# Patient Record
Sex: Male | Born: 1950 | Race: White | Hispanic: No | Smoking: Never smoker
Health system: Southern US, Community
[De-identification: ages and names within clinical notes are randomized; demographics above are authoritative.]

## PROBLEM LIST (undated history)

## (undated) DIAGNOSIS — G20A1 Parkinson's disease without dyskinesia, without mention of fluctuations: Secondary | ICD-10-CM

## (undated) DIAGNOSIS — I10 Essential (primary) hypertension: Secondary | ICD-10-CM

## (undated) DIAGNOSIS — C801 Malignant (primary) neoplasm, unspecified: Secondary | ICD-10-CM

## (undated) DIAGNOSIS — F32A Depression, unspecified: Secondary | ICD-10-CM

## (undated) DIAGNOSIS — F419 Anxiety disorder, unspecified: Secondary | ICD-10-CM

## (undated) DIAGNOSIS — G20 Parkinson's disease: Secondary | ICD-10-CM

## (undated) DIAGNOSIS — F329 Major depressive disorder, single episode, unspecified: Secondary | ICD-10-CM

## (undated) HISTORY — DX: Major depressive disorder, single episode, unspecified: F32.9

## (undated) HISTORY — PX: CHOLECYSTECTOMY: SHX55

## (undated) HISTORY — PX: BACK SURGERY: SHX140

## (undated) HISTORY — DX: Malignant (primary) neoplasm, unspecified: C80.1

## (undated) HISTORY — PX: JOINT REPLACEMENT: SHX530

## (undated) HISTORY — DX: Anxiety disorder, unspecified: F41.9

## (undated) HISTORY — DX: Depression, unspecified: F32.A

## (undated) HISTORY — PX: DEEP BRAIN STIMULATOR PLACEMENT: SHX608

---

## 1981-11-07 HISTORY — PX: ORCHIECTOMY: SHX2116

## 2005-02-24 ENCOUNTER — Ambulatory Visit: Payer: Self-pay | Admitting: Pain Medicine

## 2005-02-25 ENCOUNTER — Ambulatory Visit: Payer: Self-pay | Admitting: Pain Medicine

## 2005-03-08 ENCOUNTER — Ambulatory Visit: Payer: Self-pay | Admitting: Pain Medicine

## 2005-03-22 ENCOUNTER — Ambulatory Visit: Payer: Self-pay | Admitting: Physician Assistant

## 2005-03-29 ENCOUNTER — Ambulatory Visit: Payer: Self-pay | Admitting: Pain Medicine

## 2005-04-12 ENCOUNTER — Ambulatory Visit: Payer: Self-pay | Admitting: Pain Medicine

## 2005-04-26 ENCOUNTER — Ambulatory Visit: Payer: Self-pay | Admitting: Physician Assistant

## 2005-09-23 ENCOUNTER — Inpatient Hospital Stay (HOSPITAL_COMMUNITY): Admission: RE | Admit: 2005-09-23 | Discharge: 2005-09-27 | Payer: Self-pay | Admitting: Orthopedic Surgery

## 2005-09-23 ENCOUNTER — Ambulatory Visit: Payer: Self-pay | Admitting: Internal Medicine

## 2005-10-03 ENCOUNTER — Ambulatory Visit: Payer: Self-pay

## 2005-10-06 ENCOUNTER — Ambulatory Visit: Payer: Self-pay | Admitting: Orthopedic Surgery

## 2005-10-10 ENCOUNTER — Ambulatory Visit: Payer: Self-pay | Admitting: Orthopedic Surgery

## 2005-10-18 ENCOUNTER — Ambulatory Visit: Payer: Self-pay

## 2005-10-24 ENCOUNTER — Ambulatory Visit: Payer: Self-pay

## 2005-11-01 ENCOUNTER — Ambulatory Visit: Payer: Self-pay | Admitting: Orthopedic Surgery

## 2005-11-11 ENCOUNTER — Inpatient Hospital Stay (HOSPITAL_COMMUNITY): Admission: RE | Admit: 2005-11-11 | Discharge: 2005-11-16 | Payer: Self-pay | Admitting: Orthopedic Surgery

## 2005-11-18 ENCOUNTER — Ambulatory Visit: Payer: Self-pay | Admitting: Orthopedic Surgery

## 2005-11-22 ENCOUNTER — Ambulatory Visit: Payer: Self-pay | Admitting: Family Medicine

## 2005-11-25 ENCOUNTER — Ambulatory Visit: Payer: Self-pay | Admitting: Orthopedic Surgery

## 2005-12-01 ENCOUNTER — Encounter: Payer: Self-pay | Admitting: Orthopedic Surgery

## 2005-12-08 ENCOUNTER — Encounter: Payer: Self-pay | Admitting: Orthopedic Surgery

## 2006-01-05 ENCOUNTER — Encounter: Payer: Self-pay | Admitting: Orthopedic Surgery

## 2007-12-11 ENCOUNTER — Ambulatory Visit: Payer: Self-pay | Admitting: Internal Medicine

## 2008-07-30 ENCOUNTER — Ambulatory Visit: Payer: Self-pay | Admitting: Pain Medicine

## 2008-08-07 ENCOUNTER — Ambulatory Visit: Payer: Self-pay | Admitting: Pain Medicine

## 2008-08-21 ENCOUNTER — Ambulatory Visit: Payer: Self-pay | Admitting: Physician Assistant

## 2008-08-28 ENCOUNTER — Ambulatory Visit: Payer: Self-pay | Admitting: Pain Medicine

## 2008-09-08 ENCOUNTER — Ambulatory Visit: Payer: Self-pay | Admitting: Pain Medicine

## 2008-09-11 ENCOUNTER — Ambulatory Visit: Payer: Self-pay | Admitting: Pain Medicine

## 2008-09-18 ENCOUNTER — Ambulatory Visit: Payer: Self-pay | Admitting: Physician Assistant

## 2008-10-16 ENCOUNTER — Ambulatory Visit: Payer: Self-pay | Admitting: Physician Assistant

## 2008-10-21 ENCOUNTER — Ambulatory Visit: Payer: Self-pay | Admitting: Pain Medicine

## 2008-11-12 ENCOUNTER — Ambulatory Visit: Payer: Self-pay | Admitting: Pain Medicine

## 2008-11-18 ENCOUNTER — Encounter: Payer: Self-pay | Admitting: Pain Medicine

## 2008-11-20 ENCOUNTER — Ambulatory Visit: Payer: Self-pay | Admitting: Pain Medicine

## 2008-12-04 ENCOUNTER — Ambulatory Visit: Payer: Self-pay | Admitting: Physician Assistant

## 2008-12-16 ENCOUNTER — Ambulatory Visit: Payer: Self-pay | Admitting: Pain Medicine

## 2008-12-19 ENCOUNTER — Encounter: Payer: Self-pay | Admitting: Pain Medicine

## 2008-12-31 ENCOUNTER — Ambulatory Visit: Payer: Self-pay | Admitting: Pain Medicine

## 2009-01-05 ENCOUNTER — Encounter: Payer: Self-pay | Admitting: Pain Medicine

## 2010-07-21 ENCOUNTER — Ambulatory Visit: Payer: Self-pay | Admitting: Internal Medicine

## 2011-02-15 ENCOUNTER — Encounter: Payer: Self-pay | Admitting: Internal Medicine

## 2011-03-08 ENCOUNTER — Encounter: Payer: Self-pay | Admitting: Internal Medicine

## 2012-01-07 DIAGNOSIS — G20 Parkinson's disease: Secondary | ICD-10-CM | POA: Insufficient documentation

## 2012-07-11 DIAGNOSIS — M419 Scoliosis, unspecified: Secondary | ICD-10-CM | POA: Insufficient documentation

## 2012-07-11 DIAGNOSIS — M19032 Primary osteoarthritis, left wrist: Secondary | ICD-10-CM | POA: Insufficient documentation

## 2013-10-17 DIAGNOSIS — R9389 Abnormal findings on diagnostic imaging of other specified body structures: Secondary | ICD-10-CM | POA: Insufficient documentation

## 2015-05-06 DIAGNOSIS — C4491 Basal cell carcinoma of skin, unspecified: Secondary | ICD-10-CM

## 2015-05-06 HISTORY — DX: Basal cell carcinoma of skin, unspecified: C44.91

## 2015-07-25 ENCOUNTER — Emergency Department
Admission: EM | Admit: 2015-07-25 | Discharge: 2015-07-25 | Disposition: A | Discharge status: Home / Self Care | Payer: 59 | Attending: Internal Medicine | Admitting: Internal Medicine

## 2015-07-25 ENCOUNTER — Emergency Department: Payer: 59

## 2015-07-25 ENCOUNTER — Encounter: Payer: Self-pay | Admitting: Emergency Medicine

## 2015-07-25 DIAGNOSIS — R079 Chest pain, unspecified: Secondary | ICD-10-CM

## 2015-07-25 DIAGNOSIS — M79602 Pain in left arm: Secondary | ICD-10-CM | POA: Diagnosis not present

## 2015-07-25 DIAGNOSIS — I10 Essential (primary) hypertension: Secondary | ICD-10-CM | POA: Diagnosis not present

## 2015-07-25 DIAGNOSIS — R0602 Shortness of breath: Secondary | ICD-10-CM | POA: Insufficient documentation

## 2015-07-25 HISTORY — DX: Parkinson's disease: G20

## 2015-07-25 HISTORY — DX: Essential (primary) hypertension: I10

## 2015-07-25 HISTORY — DX: Parkinson's disease without dyskinesia, without mention of fluctuations: G20.A1

## 2015-07-25 LAB — COMPREHENSIVE METABOLIC PANEL
ALBUMIN: 4.5 g/dL (ref 3.5–5.0)
ALT: 13 U/L — ABNORMAL LOW (ref 17–63)
AST: 39 U/L (ref 15–41)
Alkaline Phosphatase: 72 U/L (ref 38–126)
Anion gap: 9 (ref 5–15)
BILIRUBIN TOTAL: 1.6 mg/dL — AB (ref 0.3–1.2)
BUN: 18 mg/dL (ref 6–20)
CO2: 26 mmol/L (ref 22–32)
Calcium: 9.6 mg/dL (ref 8.9–10.3)
Chloride: 106 mmol/L (ref 101–111)
Creatinine, Ser: 1.02 mg/dL (ref 0.61–1.24)
GFR calc Af Amer: >60 mL/min (ref >60)
GFR calc non Af Amer: >60 mL/min (ref >60)
Glucose, Bld: 98 mg/dL (ref 65–99)
Potassium: 4.2 mmol/L (ref 3.5–5.1)
Sodium: 141 mmol/L (ref 135–145)
Total Protein: 7.4 g/dL (ref 6.5–8.1)

## 2015-07-25 LAB — TROPONIN I: Troponin I: <0.03 ng/mL (ref <0.031)

## 2015-07-25 LAB — CBC
HCT: 47.9 % (ref 40.0–52.0)
Hemoglobin: 16.1 g/dL (ref 13.0–18.0)
MCH: 28.6 pg (ref 26.0–34.0)
MCHC: 33.6 g/dL (ref 32.0–36.0)
MCV: 85.3 fL (ref 80.0–100.0)
PLATELETS: 193 10*3/uL (ref 150–440)
RBC: 5.62 MIL/uL (ref 4.40–5.90)
RDW: 14.3 % (ref 11.5–14.5)
WBC: 9.5 10*3/uL (ref 3.8–10.6)

## 2015-07-25 MED ORDER — ACETAMINOPHEN 500 MG PO TABS
1000.0000 mg | ORAL_TABLET | Freq: Once | ORAL | Status: AC
  Administered 2015-07-25: 1000 mg via ORAL
  Filled 2015-07-25: qty 2

## 2015-07-25 MED ORDER — TRAMADOL HCL 50 MG PO TABS: 50.0000 mg | ORAL_TABLET | Freq: Four times a day (QID) | ORAL | Status: DC | PRN

## 2015-07-25 NOTE — ED Notes (Signed)
Patient reports recent productive cough and was on PO atbx that he finished on Monday.

## 2015-07-25 NOTE — ED Notes (Signed)
Pt reports left arm pain that moved to chest and SOB that started about 10am,. Pt states he feels like he is going to pass out. States he took 4 baby Asprin at home

## 2015-07-25 NOTE — Discharge Instructions (Signed)

## 2015-07-25 NOTE — ED Notes (Addendum)
Patient has multiple medical c/o's. He states that he has had trouble with raising his left arm up to shampoo his head. Has a history of rotator cuff repair and left wrist surgery. Also c/o pain to the left wrist to the left elbow.  Pt states a discomfort to his left chest.  He has a brain stimulator implant in his left chest for parkinson's. Alert and oriented.  Reports to drinking alcohol last night.

## 2015-07-25 NOTE — ED Notes (Signed)
N

## 2015-07-25 NOTE — ED Provider Notes (Signed)
[facility] Emergency Department Provider Note     Time seen: ----------------------------------------- 2:14 PM on 07/25/2015 -----------------------------------------    I have reviewed the triage vital signs and the nursing notes.   HISTORY  Chief Complaint Chest Pain and Shortness of Breath    HPI Charles Choi is a 64 y.o. male who presents ER with mostly left arm pain. Patient states he had rotator cuff repair and left wrist surgery in the past. Patient has an appointment on Monday to see orthopedics for his left arm pain. He has history of Parkinson's disease as it brain stimulator implant to his left chest. He is also having some discomfort in his left chest today. Reports drinking alcohol last night.   Past Medical History  Diagnosis Date  . Hypertension   . Parkinson's disease     There are no active problems to display for this patient.   Past Surgical History  Procedure Laterality Date  . Joint replacement      left knee x2  . Back surgery    . Cholecystectomy      Allergies Phenergan and Reglan  Social History Social History  Substance Use Topics  . Smoking status: Never Smoker   . Smokeless tobacco: None  . Alcohol Use: 2.4 oz/week    4 Shots of liquor per week    Review of Systems Constitutional: Negative for fever. Eyes: Negative for visual changes. ENT: Negative for sore throat. Cardiovascular: Positive for chest pain Respiratory: Negative for shortness of breath. Gastrointestinal: Negative for abdominal pain, vomiting and diarrhea. Genitourinary: Negative for dysuria. Musculoskeletal: Negative for back pain. Skin: Negative for rash. Neurological: Negative for headaches, focal weakness or numbness.  10-point ROS otherwise negative.  ____________________________________________   PHYSICAL EXAM:  VITAL SIGNS: ED Triage Vitals  Enc Vitals Group     BP 07/25/15 1331 153/98 mmHg     Pulse Rate 07/25/15 1331  98     Resp 07/25/15 1331 18     Temp --      Temp src --      SpO2 07/25/15 1331 99 %     Weight 07/25/15 1331 160 lb (72.576 kg)     Height 07/25/15 1331 5\' 8"  (1.727 m)     Head Cir --      Peak Flow --      Pain Score 07/25/15 1332 5     Pain Loc --      Pain Edu? --      Excl. in [place]? --     Constitutional: Alert and oriented. Well appearing and in no distress. Eyes: Conjunctivae are normal. PERRL. Normal extraocular movements. ENT   Head: Normocephalic and atraumatic.   Nose: No congestion/rhinnorhea.   Mouth/Throat: Mucous membranes are moist.   Neck: No stridor. Cardiovascular: Normal rate, regular rhythm. Normal and symmetric distal pulses are present in all extremities. No murmurs, rubs, or gallops. Respiratory: Normal respiratory effort without tachypnea nor retractions. Breath sounds are clear and equal bilaterally. No wheezes/rales/rhonchi. Gastrointestinal: Soft and nontender. No distention. No abdominal bruits.  Musculoskeletal: Mild pain with range of motion left upper extremity. Neurologic:  Normal speech and language. No gross focal neurologic deficits are appreciated. Speech is normal. No gait instability. Skin:  Skin is warm, dry and intact. No rash noted. Psychiatric: Mood and affect are normal. Speech and behavior are normal. Patient exhibits appropriate insight and judgment. ____________________________________________  EKG: Interpreted by me. Normal sinus rhythm rate 82 bpm, left ventricular hypertrophy. Possible septal  infarct. No evidence of acute infarction  ____________________________________________  ED COURSE:  Pertinent labs & imaging results that were available during my care of the patient were reviewed by me and considered in my medical decision making (see chart for details). Check cardiac labs, doubt acute coronary syndrome. ____________________________________________    LABS (pertinent positives/negatives)  Labs Reviewed   COMPREHENSIVE METABOLIC PANEL - Abnormal; Notable for the following:    ALT 13 (*)    Total Bilirubin 1.6 (*)    All other components within normal limits  CBC  TROPONIN I    RADIOLOGY Images were viewed by me  Chest x-ray  IMPRESSION: No evidence of infiltrate or edema.  Consider CT thorax for possible left lower lobe pulmonary nodule. ____________________________________________  FINAL ASSESSMENT AND PLAN  Chest pain  Plan: Patient with labs and imaging as dictated above. No clear etiology for his symptoms are identified. Likely chronic pain or arthritis related. He is stable for outpatient follow-up   Earleen Newport, MD   Earleen Newport, MD 07/25/15 [phone]

## 2016-06-10 ENCOUNTER — Ambulatory Visit
Admission: RE | Admit: 2016-06-10 | Discharge: 2016-06-10 | Disposition: A | Discharge status: Home / Self Care | Payer: 59 | Source: Ambulatory Visit | Attending: Physician Assistant | Admitting: Physician Assistant

## 2016-06-10 ENCOUNTER — Other Ambulatory Visit: Payer: Self-pay | Admitting: Physician Assistant

## 2016-06-10 DIAGNOSIS — M7989 Other specified soft tissue disorders: Secondary | ICD-10-CM | POA: Insufficient documentation

## 2016-07-05 ENCOUNTER — Emergency Department: Payer: 59

## 2016-07-05 ENCOUNTER — Emergency Department
Admission: EM | Admit: 2016-07-05 | Discharge: 2016-07-05 | Disposition: A | Discharge status: Home / Self Care | Payer: 59 | Attending: Emergency Medicine | Admitting: Emergency Medicine

## 2016-07-05 ENCOUNTER — Encounter: Payer: Self-pay | Admitting: *Deleted

## 2016-07-05 DIAGNOSIS — I10 Essential (primary) hypertension: Secondary | ICD-10-CM | POA: Diagnosis not present

## 2016-07-05 DIAGNOSIS — Y999 Unspecified external cause status: Secondary | ICD-10-CM | POA: Insufficient documentation

## 2016-07-05 DIAGNOSIS — Y929 Unspecified place or not applicable: Secondary | ICD-10-CM | POA: Insufficient documentation

## 2016-07-05 DIAGNOSIS — W19XXXA Unspecified fall, initial encounter: Secondary | ICD-10-CM | POA: Diagnosis not present

## 2016-07-05 DIAGNOSIS — M25461 Effusion, right knee: Secondary | ICD-10-CM | POA: Diagnosis not present

## 2016-07-05 DIAGNOSIS — S93401A Sprain of unspecified ligament of right ankle, initial encounter: Secondary | ICD-10-CM | POA: Diagnosis not present

## 2016-07-05 DIAGNOSIS — Y939 Activity, unspecified: Secondary | ICD-10-CM | POA: Diagnosis not present

## 2016-07-05 DIAGNOSIS — G20 Parkinson's disease: Secondary | ICD-10-CM | POA: Diagnosis not present

## 2016-07-05 DIAGNOSIS — Z79899 Other long term (current) drug therapy: Secondary | ICD-10-CM | POA: Diagnosis not present

## 2016-07-05 DIAGNOSIS — S99911A Unspecified injury of right ankle, initial encounter: Secondary | ICD-10-CM | POA: Diagnosis present

## 2016-07-05 MED ORDER — OXYCODONE-ACETAMINOPHEN 5-325 MG PO TABS: 1.0000 | ORAL_TABLET | Freq: Four times a day (QID) | ORAL | 0 refills | Status: DC | PRN

## 2016-07-05 MED ORDER — HYDROCODONE-ACETAMINOPHEN 5-325 MG PO TABS: 1.0000 | ORAL_TABLET | ORAL | 0 refills | Status: DC | PRN

## 2016-07-05 MED ORDER — MELOXICAM 15 MG PO TABS: 15.0000 mg | ORAL_TABLET | Freq: Every day | ORAL | 0 refills | Status: DC

## 2016-07-05 NOTE — ED Triage Notes (Addendum)
Pt to triage via wheelchair.  Pt has parkinson's disease.  Pt fell 1 month ago.  Pt continues to have pain and swelling of right knee and ankle.  Pt reports had an u/s 3 weeks ago that was normal and had xrays that was normal.  Pt alert.   Pt denies sob or chest pain.

## 2016-07-05 NOTE — ED Provider Notes (Signed)
[facility] Emergency Department Provider Note ____________________________________________  Time seen: Approximately 6:01 PM  I have reviewed the triage vital signs and the nursing notes.   HISTORY  Chief Complaint Knee Pain and Ankle Pain    HPI Charles Choi is a 65 y.o. male who presents to the emergency department for evaluation of right knee and ankle pain. He states that approximately one month ago he had a series of non-syncopal falls secondary to instability related to Parkinson's and has had pain since that time. He was evaluated by his primary care provider and due to diffuse swelling in the right lower extremity, an ultrasound was ordered to confirm there was no DVT. He also states that he went to an orthopedic urgent care and had an x-ray of his right hip and upper leg. X-rays were normal. Again, because of the swelling in the extremity he was placed on a prednisone taper. He doesn't feel that this helped very much. He has been taking Aleve or ibuprofen at home without relief of the pain or swelling.  Past Medical History:  Diagnosis Date  . Hypertension   . Parkinson's disease ([place])     There are no active problems to display for this patient.   Past Surgical History:  Procedure Laterality Date  . BACK SURGERY    . CHOLECYSTECTOMY    . JOINT REPLACEMENT     left knee x2    Prior to Admission medications   Medication Sig Start Date End Date Taking? Authorizing Provider  amantadine (SYMMETREL) 100 MG capsule Take 100 mg by mouth daily. 07/09/15   Historical Provider, MD  carbidopa-levodopa (SINEMET IR) 25-100 MG per tablet Take 0.5-1 tablets by mouth See admin instructions. Take 1 tablet orally two times a day at 7am, and 10pm. Take 0.5 tablet orally at 10am, 1pm, 4pm, and 7pm. 07/20/15   Historical Provider, MD  losartan (COZAAR) 50 MG tablet Take 50 mg by mouth daily. 07/07/15   Historical Provider, MD  meloxicam (MOBIC) 15 MG tablet Take 1  tablet (15 mg total) by mouth daily. 07/05/16   Victorino Dike, FNP  omeprazole (PRILOSEC OTC) 20 MG tablet Take 20 mg by mouth 2 (two) times daily.    Historical Provider, MD  oxyCODONE-acetaminophen (ROXICET) 5-325 MG tablet Take 1 tablet by mouth every 6 (six) hours as needed. 07/05/16   Victorino Dike, FNP  rOPINIRole (REQUIP) 2 MG tablet Take 2 mg by mouth 6 (six) times daily. At 7am, and 1pm. 07/20/15   Historical Provider, MD  selegiline (ELDEPRYL) 5 MG capsule Take 5 mg by mouth 2 (two) times daily. At 7am and 1pm. 07/09/15   Historical Provider, MD  traZODone (DESYREL) 150 MG tablet Take 150 mg by mouth at bedtime. 06/18/15   Historical Provider, MD    Allergies Phenergan [promethazine hcl] and Reglan [metoclopramide]  No family history on file.  Social History Social History  Substance Use Topics  . Smoking status: Never Smoker  . Smokeless tobacco: Never Used  . Alcohol use 2.4 oz/week    4 Shots of liquor per week    Review of Systems Constitutional: No recent illness. Cardiovascular: Denies chest pain or palpitations. Respiratory: Denies shortness of breath. Musculoskeletal: Pain in Right lower extremity Skin: Negative for rash, wound, lesion. Neurological: Negative for focal weakness or numbness.  ____________________________________________   PHYSICAL EXAM:  VITAL SIGNS: ED Triage Vitals  Enc Vitals Group     BP 07/05/16 1732 (!) 158/75  Pulse Rate 07/05/16 1732 84     Resp 07/05/16 1732 20     Temp 07/05/16 1732 98.3 F (36.8 C)     Temp Source 07/05/16 1732 Oral     SpO2 07/05/16 1732 99 %     Weight 07/05/16 1734 165 lb (74.8 kg)     Height 07/05/16 1734 5\' 7"  (1.702 m)     Head Circumference --      Peak Flow --      Pain Score 07/05/16 1734 4     Pain Loc --      Pain Edu? --      Excl. in [place]? --     Constitutional: Alert and oriented. Well appearing and in no acute distress. Eyes: Conjunctivae are normal. EOMI. Head: Atraumatic. Neck: No  stridor.  Respiratory: Normal respiratory effort.   Musculoskeletal: Diffuse swelling of the right lower extremity from the knee to toes. There is an obvious joint effusion on the right knee. [place] ankle rules are negative. Neurologic:  Normal speech and language. No gross focal neurologic deficits are appreciated. Speech is normal. No gait instability. Skin:  Warm, dry, and intact. Diffuse, non-pitting edema from the knee to the toes. Psychiatric: Mood and affect are normal. Speech and behavior are normal.  ____________________________________________   LABS (all labs ordered are listed, but only abnormal results are displayed)  Labs Reviewed - No data to display ____________________________________________  RADIOLOGY  Right knee with obvious joint effusion. Mild arthritic changes. Right ankle without acute bony abnormality Per radiology. ____________________________________________   PROCEDURES  Procedure(s) performed: Jones wrap was applied to the right knee by the ER tech. Velcro ankle stirrup splint was applied to the right ankle also by the ER tech.   ____________________________________________   INITIAL IMPRESSION / ASSESSMENT AND PLAN / ED COURSE  Clinical Course    Pertinent labs & imaging results that were available during my care of the patient were reviewed by me and considered in my medical decision making (see chart for details).  Patient was instructed to rest, ice, and elevate the right lower extremity. He was instructed to wear the braces while out of bed. He is instructed to take his meloxicam and Percocet as prescribed. He was encouraged to follow up with orthopedics for symptoms that are not improving over the week. ____________________________________________   FINAL CLINICAL IMPRESSION(S) / ED DIAGNOSES  Final diagnoses:  Ankle sprain, right, initial encounter  Joint effusion, knee, right       Victorino Dike, FNP 07/05/16 [place], MD 07/05/16 [phone]

## 2016-07-14 ENCOUNTER — Ambulatory Visit
Admission: RE | Admit: 2016-07-14 | Discharge: 2016-07-14 | Disposition: A | Discharge status: Home / Self Care | Payer: 59 | Source: Ambulatory Visit | Attending: Internal Medicine | Admitting: Internal Medicine

## 2016-07-14 ENCOUNTER — Other Ambulatory Visit: Payer: Self-pay | Admitting: Internal Medicine

## 2016-07-14 DIAGNOSIS — M79604 Pain in right leg: Secondary | ICD-10-CM

## 2016-07-14 DIAGNOSIS — R600 Localized edema: Secondary | ICD-10-CM | POA: Insufficient documentation

## 2016-07-15 ENCOUNTER — Ambulatory Visit: Payer: Medicare Other

## 2016-07-21 ENCOUNTER — Emergency Department: Payer: 59

## 2016-07-21 ENCOUNTER — Encounter: Payer: Self-pay | Admitting: Emergency Medicine

## 2016-07-21 ENCOUNTER — Emergency Department
Admission: EM | Admit: 2016-07-21 | Discharge: 2016-07-21 | Disposition: A | Discharge status: Home / Self Care | Payer: 59 | Attending: Emergency Medicine | Admitting: Emergency Medicine

## 2016-07-21 DIAGNOSIS — F1290 Cannabis use, unspecified, uncomplicated: Secondary | ICD-10-CM | POA: Insufficient documentation

## 2016-07-21 DIAGNOSIS — M7121 Synovial cyst of popliteal space [Baker], right knee: Secondary | ICD-10-CM | POA: Insufficient documentation

## 2016-07-21 DIAGNOSIS — M25561 Pain in right knee: Secondary | ICD-10-CM | POA: Diagnosis present

## 2016-07-21 DIAGNOSIS — M25461 Effusion, right knee: Secondary | ICD-10-CM

## 2016-07-21 DIAGNOSIS — G20 Parkinson's disease: Secondary | ICD-10-CM | POA: Diagnosis not present

## 2016-07-21 DIAGNOSIS — Z79899 Other long term (current) drug therapy: Secondary | ICD-10-CM | POA: Diagnosis not present

## 2016-07-21 DIAGNOSIS — I10 Essential (primary) hypertension: Secondary | ICD-10-CM | POA: Insufficient documentation

## 2016-07-21 MED ORDER — OXYCODONE HCL 5 MG PO TABS
10.0000 mg | ORAL_TABLET | Freq: Once | ORAL | Status: AC
  Administered 2016-07-21: 10 mg via ORAL
  Filled 2016-07-21: qty 2

## 2016-07-21 MED ORDER — HYDROMORPHONE HCL 1 MG/ML IJ SOLN
1.0000 mg | Freq: Once | INTRAMUSCULAR | Status: AC
  Administered 2016-07-21: 1 mg via INTRAMUSCULAR
  Filled 2016-07-21: qty 1

## 2016-07-21 NOTE — Discharge Instructions (Signed)
Please wear the compression stockings daily. Wear the knee immobilizer while out of bed.  Call Dr. Nicholaus Bloom office in the morning to schedule an appointment. Continue your medications as prescribed.

## 2016-07-21 NOTE — ED Provider Notes (Signed)
[facility] Emergency Department Provider Note ____________________________________________  Time seen: Approximately 1942  I have reviewed the triage vital signs and the nursing notes.   HISTORY  Chief Complaint Knee Pain    HPI Charles Choi is a 65 y.o. male presents to the emergency department for evaluation of right knee pain.He had a series of non-syncopal falls secondary to instability related to Parkinson's disease approximately 6 weeks ago. Since that time, he has had pain in the right knee. He has been evaluated by his primary care provider, has had an ultrasound to confirm no DVT, plain x-rays of the hip, knee, and ankle, and has been evaluated by Dr. Roland Rack and his PA who diagnosed him with a ruptured Bakers cyst and gave him a steroid injection in the right knee. He states that he saw orthopedics last on 07/15/2016. He has a follow-up appointment scheduled, but doesn't recall when. He states that the knee is "spasming and is so painful he can't stand it."   Past Medical History:  Diagnosis Date  . Hypertension   . Parkinson's disease ([place])     There are no active problems to display for this patient.   Past Surgical History:  Procedure Laterality Date  . BACK SURGERY    . CHOLECYSTECTOMY    . JOINT REPLACEMENT     left knee x2    Prior to Admission medications   Medication Sig Start Date End Date Taking? Authorizing Provider  amantadine (SYMMETREL) 100 MG capsule Take 100 mg by mouth daily. 07/09/15   Historical Provider, MD  carbidopa-levodopa (SINEMET IR) 25-100 MG per tablet Take 0.5-1 tablets by mouth See admin instructions. Take 1 tablet orally two times a day at 7am, and 10pm. Take 0.5 tablet orally at 10am, 1pm, 4pm, and 7pm. 07/20/15   Historical Provider, MD  losartan (COZAAR) 50 MG tablet Take 50 mg by mouth daily. 07/07/15   Historical Provider, MD  meloxicam (MOBIC) 15 MG tablet Take 1 tablet (15 mg total) by mouth daily. 07/05/16   Victorino Dike, FNP  omeprazole (PRILOSEC OTC) 20 MG tablet Take 20 mg by mouth 2 (two) times daily.    Historical Provider, MD  oxyCODONE-acetaminophen (ROXICET) 5-325 MG tablet Take 1 tablet by mouth every 6 (six) hours as needed. 07/05/16   Victorino Dike, FNP  rOPINIRole (REQUIP) 2 MG tablet Take 2 mg by mouth 6 (six) times daily. At 7am, and 1pm. 07/20/15   Historical Provider, MD  selegiline (ELDEPRYL) 5 MG capsule Take 5 mg by mouth 2 (two) times daily. At 7am and 1pm. 07/09/15   Historical Provider, MD  traZODone (DESYREL) 150 MG tablet Take 150 mg by mouth at bedtime. 06/18/15   Historical Provider, MD    Allergies Phenergan [promethazine hcl] and Reglan [metoclopramide]  No family history on file.  Social History Social History  Substance Use Topics  . Smoking status: Never Smoker  . Smokeless tobacco: Never Used  . Alcohol use 2.4 oz/week    4 Shots of liquor per week    Review of Systems Constitutional: No recent illness. Cardiovascular: Denies chest pain or palpitations. Respiratory: Denies shortness of breath. Musculoskeletal: Pain in right knee Skin: Negative for rash, wound, lesion. Neurological: Negative for focal weakness or numbness.  ____________________________________________   PHYSICAL EXAM:  VITAL SIGNS: ED Triage Vitals  Enc Vitals Group     BP 07/21/16 1827 (!) 171/89     Pulse Rate 07/21/16 1827 91     Resp  07/21/16 1827 18     Temp 07/21/16 1827 98.2 F (36.8 C)     Temp Source 07/21/16 1827 Oral     SpO2 --      Weight 07/21/16 1830 162 lb (73.5 kg)     Height 07/21/16 1830 5\' 7"  (1.702 m)     Head Circumference --      Peak Flow --      Pain Score 07/21/16 1830 6     Pain Loc --      Pain Edu? --      Excl. in [place]? --     Constitutional: Alert and oriented. Well appearing and in no acute distress. Eyes: Conjunctivae are normal. EOMI. Head: Atraumatic. Neck: No stridor.  Respiratory: Normal respiratory effort.   Musculoskeletal: Visual  muscle spasm of the right quadriceps and severe tenderness to the lateral aspect of the knee. Crepitus is also noted on exam. Neurologic:  Normal speech and language. No gross focal neurologic deficits are appreciated. Speech is normal. No gait instability. Skin:  Skin is warm, dry and intact. Atraumatic. No erythema or lesion noted Psychiatric: Mood and affect are normal. Speech and behavior are normal.  ____________________________________________   LABS (all labs ordered are listed, but only abnormal results are displayed)  Labs Reviewed - No data to display ____________________________________________  RADIOLOGY  CT right knee:  IMPRESSION:  1. Heterogeneous complex joint effusion with synovial thickening and  intra-articular debris. Fluid in the medial knee may reflect Baker  cyst, +/-rupture. There is an ill-defined intramuscular fluid  collection in the posterior lateral compartment of the calf that is  a elongated measuring greater than 12 cm than the setting of injury  is likely a liquefying hematoma, infected fluid collection or  abscess not excluded. Recommend continued clinical follow-up to  exclude cystic mass, felt less likely given recent injury.  2. 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 Remote fracture of the medial tibial plateau.  3. MRI recommended for evaluation of internal derangement of the  knee.    ____________________________________________   PROCEDURES  Procedure(s) performed: Knee immobilizer applied to the right knee by ER tech.    ____________________________________________   INITIAL IMPRESSION / ASSESSMENT AND PLAN / ED COURSE  Clinical Course  Comment By Time  Continue to await CT results. Patient's pain has improved. He is resting in the room talking with family/friend. Victorino Dike, FNP 09/14 2135    Pertinent labs & imaging results that were available during my care of the patient  were reviewed by me and considered in my medical decision making (see chart for details).  Because he has had plain films, ultrasound, evaluation by primary care, and evaluation by orthopedics will request a CT of the knee tonight. 1 mg of Dilaudid intramuscular ordered as well.  Case discussed with Dr. Roland Rack who will see him in follow up. Will apply a knee immobilizer and encourage him to use it while out of bed.  ____________________________________________   FINAL CLINICAL IMPRESSION(S) / ED DIAGNOSES  Final diagnoses:  Baker's cyst of knee, right  Knee effusion, right       Victorino Dike, FNP 07/25/16 1243    Harvest Dark, MD 07/25/16 [phone]

## 2016-07-21 NOTE — ED Triage Notes (Signed)
Reports pain in right hip radiating down leg and knee pain since a fall last month.

## 2016-07-21 NOTE — ED Notes (Signed)
Pt called at [place] with no response. First nurse Sharyn Lull notified.

## 2016-07-28 ENCOUNTER — Other Ambulatory Visit: Payer: Self-pay | Admitting: Student

## 2016-07-28 DIAGNOSIS — M1711 Unilateral primary osteoarthritis, right knee: Secondary | ICD-10-CM

## 2016-07-28 DIAGNOSIS — M7121 Synovial cyst of popliteal space [Baker], right knee: Secondary | ICD-10-CM

## 2016-07-28 DIAGNOSIS — M7989 Other specified soft tissue disorders: Secondary | ICD-10-CM

## 2016-08-02 ENCOUNTER — Other Ambulatory Visit: Payer: Self-pay | Admitting: Student

## 2016-08-02 ENCOUNTER — Ambulatory Visit
Admission: RE | Admit: 2016-08-02 | Discharge: 2016-08-02 | Disposition: A | Discharge status: Home / Self Care | Payer: 59 | Source: Ambulatory Visit | Attending: Student | Admitting: Student

## 2016-08-02 ENCOUNTER — Ambulatory Visit (HOSPITAL_BASED_OUTPATIENT_CLINIC_OR_DEPARTMENT_OTHER): Payer: Medicare Other

## 2016-08-02 DIAGNOSIS — M7121 Synovial cyst of popliteal space [Baker], right knee: Secondary | ICD-10-CM

## 2016-08-02 DIAGNOSIS — M7989 Other specified soft tissue disorders: Secondary | ICD-10-CM | POA: Insufficient documentation

## 2016-08-02 DIAGNOSIS — M1711 Unilateral primary osteoarthritis, right knee: Secondary | ICD-10-CM | POA: Diagnosis not present

## 2016-08-02 NOTE — Procedures (Signed)
Under US guidance, aspiration of right calf seroma was performed. No immediate complication.

## 2016-08-14 ENCOUNTER — Observation Stay
Admission: EM | Admit: 2016-08-14 | Discharge: 2016-08-15 | Disposition: A | Discharge status: Home / Self Care | Payer: 59 | Attending: Internal Medicine | Admitting: Internal Medicine

## 2016-08-14 ENCOUNTER — Emergency Department: Payer: 59

## 2016-08-14 ENCOUNTER — Other Ambulatory Visit: Payer: Self-pay

## 2016-08-14 ENCOUNTER — Encounter: Payer: Self-pay | Admitting: *Deleted

## 2016-08-14 DIAGNOSIS — F410 Panic disorder [episodic paroxysmal anxiety] without agoraphobia: Secondary | ICD-10-CM | POA: Diagnosis not present

## 2016-08-14 DIAGNOSIS — G20 Parkinson's disease: Secondary | ICD-10-CM | POA: Insufficient documentation

## 2016-08-14 DIAGNOSIS — Z96652 Presence of left artificial knee joint: Secondary | ICD-10-CM | POA: Insufficient documentation

## 2016-08-14 DIAGNOSIS — I259 Chronic ischemic heart disease, unspecified: Secondary | ICD-10-CM | POA: Insufficient documentation

## 2016-08-14 DIAGNOSIS — M6281 Muscle weakness (generalized): Secondary | ICD-10-CM

## 2016-08-14 DIAGNOSIS — Z9889 Other specified postprocedural states: Secondary | ICD-10-CM | POA: Diagnosis not present

## 2016-08-14 DIAGNOSIS — Z9049 Acquired absence of other specified parts of digestive tract: Secondary | ICD-10-CM | POA: Diagnosis not present

## 2016-08-14 DIAGNOSIS — Z888 Allergy status to other drugs, medicaments and biological substances status: Secondary | ICD-10-CM | POA: Insufficient documentation

## 2016-08-14 DIAGNOSIS — Z791 Long term (current) use of non-steroidal anti-inflammatories (NSAID): Secondary | ICD-10-CM | POA: Diagnosis not present

## 2016-08-14 DIAGNOSIS — Z79899 Other long term (current) drug therapy: Secondary | ICD-10-CM | POA: Insufficient documentation

## 2016-08-14 DIAGNOSIS — R079 Chest pain, unspecified: Secondary | ICD-10-CM | POA: Diagnosis present

## 2016-08-14 DIAGNOSIS — I10 Essential (primary) hypertension: Secondary | ICD-10-CM | POA: Insufficient documentation

## 2016-08-14 DIAGNOSIS — F331 Major depressive disorder, recurrent, moderate: Secondary | ICD-10-CM | POA: Insufficient documentation

## 2016-08-14 DIAGNOSIS — Z9181 History of falling: Secondary | ICD-10-CM

## 2016-08-14 DIAGNOSIS — M1711 Unilateral primary osteoarthritis, right knee: Secondary | ICD-10-CM | POA: Insufficient documentation

## 2016-08-14 LAB — COMPREHENSIVE METABOLIC PANEL
ALBUMIN: 3.8 g/dL (ref 3.5–5.0)
ALT: 16 U/L — ABNORMAL LOW (ref 17–63)
AST: 23 U/L (ref 15–41)
Alkaline Phosphatase: 114 U/L (ref 38–126)
Anion gap: 8 (ref 5–15)
BUN: 21 mg/dL — AB (ref 6–20)
CHLORIDE: 101 mmol/L (ref 101–111)
CO2: 28 mmol/L (ref 22–32)
Calcium: 9.3 mg/dL (ref 8.9–10.3)
Creatinine, Ser: 1.02 mg/dL (ref 0.61–1.24)
GFR calc Af Amer: >60 mL/min (ref >60)
GFR calc non Af Amer: >60 mL/min (ref >60)
Glucose, Bld: 106 mg/dL — ABNORMAL HIGH (ref 65–99)
Potassium: 3.8 mmol/L (ref 3.5–5.1)
Sodium: 137 mmol/L (ref 135–145)
Total Bilirubin: 1.5 mg/dL — ABNORMAL HIGH (ref 0.3–1.2)
Total Protein: 6.9 g/dL (ref 6.5–8.1)

## 2016-08-14 LAB — CBC
HCT: 46.9 % (ref 40.0–52.0)
Hemoglobin: 15.9 g/dL (ref 13.0–18.0)
MCH: 28.2 pg (ref 26.0–34.0)
MCHC: 33.8 g/dL (ref 32.0–36.0)
MCV: 83.5 fL (ref 80.0–100.0)
Platelets: 251 10*3/uL (ref 150–440)
RBC: 5.62 MIL/uL (ref 4.40–5.90)
RDW: 15 % — ABNORMAL HIGH (ref 11.5–14.5)
WBC: 9.3 10*3/uL (ref 3.8–10.6)

## 2016-08-14 LAB — TROPONIN I: Troponin I: <0.03 ng/mL (ref <0.03)

## 2016-08-14 LAB — TSH: TSH: 3.489 u[IU]/mL (ref 0.350–4.500)

## 2016-08-14 LAB — PHOSPHORUS: PHOSPHORUS: 2.8 mg/dL (ref 2.5–4.6)

## 2016-08-14 LAB — MAGNESIUM: MAGNESIUM: 2.3 mg/dL (ref 1.7–2.4)

## 2016-08-14 MED ORDER — SODIUM CHLORIDE 0.9 % IV SOLN
INTRAVENOUS | Status: DC
  Administered 2016-08-14 – 2016-08-15 (×2): via INTRAVENOUS

## 2016-08-14 MED ORDER — TRAZODONE HCL 50 MG PO TABS
150.0000 mg | ORAL_TABLET | Freq: Every day | ORAL | Status: DC
  Administered 2016-08-14: 150 mg via ORAL
  Filled 2016-08-14: qty 1

## 2016-08-14 MED ORDER — ETODOLAC 200 MG PO CAPS
200.0000 mg | ORAL_CAPSULE | Freq: Three times a day (TID) | ORAL | Status: DC
  Administered 2016-08-14 – 2016-08-15 (×3): 200 mg via ORAL
  Filled 2016-08-14 (×4): qty 1

## 2016-08-14 MED ORDER — PANTOPRAZOLE SODIUM 40 MG PO TBEC
40.0000 mg | DELAYED_RELEASE_TABLET | Freq: Two times a day (BID) | ORAL | Status: DC
  Administered 2016-08-14 – 2016-08-15 (×2): 40 mg via ORAL
  Filled 2016-08-14 (×2): qty 1

## 2016-08-14 MED ORDER — SODIUM CHLORIDE 0.9% FLUSH
3.0000 mL | Freq: Two times a day (BID) | INTRAVENOUS | Status: DC
  Administered 2016-08-14 – 2016-08-15 (×2): 3 mL via INTRAVENOUS

## 2016-08-14 MED ORDER — HYDROCODONE-ACETAMINOPHEN 5-325 MG PO TABS
1.0000 | ORAL_TABLET | ORAL | Status: DC | PRN
  Administered 2016-08-14: 2 via ORAL
  Filled 2016-08-14: qty 2

## 2016-08-14 MED ORDER — BISACODYL 5 MG PO TBEC: 5.0000 mg | DELAYED_RELEASE_TABLET | Freq: Every day | ORAL | Status: DC | PRN

## 2016-08-14 MED ORDER — AMANTADINE HCL 100 MG PO CAPS
100.0000 mg | ORAL_CAPSULE | Freq: Every day | ORAL | Status: DC
  Administered 2016-08-15: 100 mg via ORAL
  Filled 2016-08-14: qty 1

## 2016-08-14 MED ORDER — ACETAMINOPHEN 325 MG PO TABS: 650.0000 mg | ORAL_TABLET | Freq: Four times a day (QID) | ORAL | Status: DC | PRN

## 2016-08-14 MED ORDER — CARBIDOPA-LEVODOPA 25-100 MG PO TABS
0.5000 | ORAL_TABLET | Freq: Four times a day (QID) | ORAL | Status: DC
  Administered 2016-08-15 (×2): 0.5 via ORAL
  Filled 2016-08-14 (×2): qty 1

## 2016-08-14 MED ORDER — LOSARTAN POTASSIUM 50 MG PO TABS
50.0000 mg | ORAL_TABLET | Freq: Every day | ORAL | Status: DC
  Administered 2016-08-15: 50 mg via ORAL
  Filled 2016-08-14: qty 1

## 2016-08-14 MED ORDER — ACETAMINOPHEN 650 MG RE SUPP: 650.0000 mg | Freq: Four times a day (QID) | RECTAL | Status: DC | PRN

## 2016-08-14 MED ORDER — SENNOSIDES-DOCUSATE SODIUM 8.6-50 MG PO TABS: 1.0000 | ORAL_TABLET | Freq: Every evening | ORAL | Status: DC | PRN

## 2016-08-14 MED ORDER — ENOXAPARIN SODIUM 40 MG/0.4ML ~~LOC~~ SOLN
40.0000 mg | SUBCUTANEOUS | Status: DC
  Filled 2016-08-14: qty 0.4

## 2016-08-14 MED ORDER — TRAMADOL HCL 50 MG PO TABS
50.0000 mg | ORAL_TABLET | Freq: Four times a day (QID) | ORAL | Status: DC | PRN
  Administered 2016-08-15: 50 mg via ORAL
  Filled 2016-08-14: qty 1

## 2016-08-14 MED ORDER — CARBIDOPA-LEVODOPA 25-100 MG PO TABS
1.0000 | ORAL_TABLET | Freq: Two times a day (BID) | ORAL | Status: DC
  Administered 2016-08-14 – 2016-08-15 (×2): 1 via ORAL
  Filled 2016-08-14 (×3): qty 1

## 2016-08-14 MED ORDER — ZOLPIDEM TARTRATE 5 MG PO TABS
5.0000 mg | ORAL_TABLET | Freq: Every evening | ORAL | Status: DC | PRN
  Administered 2016-08-14 – 2016-08-15 (×2): 5 mg via ORAL
  Filled 2016-08-14 (×2): qty 1

## 2016-08-14 MED ORDER — SELEGILINE HCL 5 MG PO CAPS
5.0000 mg | ORAL_CAPSULE | Freq: Two times a day (BID) | ORAL | Status: DC
  Administered 2016-08-14 – 2016-08-15 (×3): 5 mg via ORAL
  Filled 2016-08-14 (×4): qty 1

## 2016-08-14 MED ORDER — ROPINIROLE HCL 1 MG PO TABS
2.0000 mg | ORAL_TABLET | Freq: Every day | ORAL | Status: DC
  Administered 2016-08-14 – 2016-08-15 (×4): 2 mg via ORAL
  Filled 2016-08-14 (×4): qty 2

## 2016-08-14 MED ORDER — ONDANSETRON HCL 4 MG/2ML IJ SOLN: 4.0000 mg | Freq: Four times a day (QID) | INTRAMUSCULAR | Status: DC | PRN

## 2016-08-14 MED ORDER — MAGNESIUM CITRATE PO SOLN
1.0000 | Freq: Once | ORAL | Status: DC | PRN
  Filled 2016-08-14: qty 296

## 2016-08-14 MED ORDER — CARBIDOPA-LEVODOPA 25-100 MG PO TABS: 0.5000 | ORAL_TABLET | ORAL | Status: DC

## 2016-08-14 MED ORDER — INFLUENZA VAC SPLIT QUAD 0.5 ML IM SUSY
0.5000 mL | PREFILLED_SYRINGE | INTRAMUSCULAR | Status: AC
  Administered 2016-08-15: 0.5 mL via INTRAMUSCULAR
  Filled 2016-08-14: qty 0.5

## 2016-08-14 MED ORDER — ONDANSETRON HCL 4 MG PO TABS: 4.0000 mg | ORAL_TABLET | Freq: Four times a day (QID) | ORAL | Status: DC | PRN

## 2016-08-14 MED ORDER — ASPIRIN 81 MG PO CHEW
324.0000 mg | CHEWABLE_TABLET | Freq: Once | ORAL | Status: AC
  Administered 2016-08-14: 324 mg via ORAL
  Filled 2016-08-14: qty 4

## 2016-08-14 NOTE — ED Triage Notes (Addendum)
Pt to triage via wheelchair.  Pt has chest pain for 1 hour. Pt  reports it feels like tightness.    States sob.      No n/v/d.  Pt alert.  Skin warm and dry.  Speech clear.

## 2016-08-14 NOTE — ED Notes (Signed)
Patient apologetic for previous behavior. Patient sobbing. Patient states, "I have a lot going on at home". Emotional support provided.

## 2016-08-14 NOTE — H&P (Addendum)
[place] @ [facility] Admission History and Physical Charles Choi, D.O.  ---------------------------------------------------------------------------------------------------------------------   PATIENT NAME: Charles Choi MR#: [id number] DATE OF BIRTH: August 27, 1951 DATE OF ADMISSION: 08/14/2016 PRIMARY CARE PHYSICIAN: Idelle Crouch, MD  REQUESTING/REFERRING PHYSICIAN: ED Dr. Marcelene Butte  CHIEF COMPLAINT: Chief Complaint  Patient presents with  . Chest Pain    HISTORY OF PRESENT ILLNESS: Charles Choi is a 65 y.o. male with a known history of Parkinson's and HTN presents to the emergency department complaining of Chest pain.  Patient reports that he has been having anxiety and depression which worsened today and involved left-sided chest tightness which is nonradiating, not associated with any nausea, vomiting, shortness of breath, palpitations or diaphoresis. Patient relates his chest pain to his anxiety over recent worsening of his wife's psychiatric condition. Patient states that his wife is in order and he can no longer live in his home. He reports the anxiety prompted somewhat of a panic attack during which she experienced this chest pain.  Patient states that he is ashamed about his current situation. He has been living with his 100 year old mother or staying with his brother and sister-in-law because he can no longer go back to his home. He has not sought any help for this before.  Of note patient also reports a fall several months ago with ongoing pain in his left leg. He states that he and negative Doppler and two procedures to drain a Baker's cyst from his right knee last week. Complains of right leg pain which is inhibiting his ability to ambulate effectively. He usually uses a rolling walker.  Otherwise there has been no change in status. Patient has been taking medication as prescribed and there has been no recent change in medication or diet.  There has been no  recent illness, travel or sick contacts.    Patient denies fevers/chills, weakness, dizziness, shortness of breath, N/V/C/D, abdominal pain, dysuria/frequency, changes in mental status.   EMS/ED COURSE:   Patient received chewable aspirin.  PAST MEDICAL HISTORY: Past Medical History:  Diagnosis Date  . Hypertension   . Parkinson's disease ([place])       PAST SURGICAL HISTORY: Past Surgical History:  Procedure Laterality Date  . BACK SURGERY    . CHOLECYSTECTOMY    . JOINT REPLACEMENT     left knee x2      SOCIAL HISTORY: Social History  Substance Use Topics  . Smoking status: Never Smoker  . Smokeless tobacco: Never Used  . Alcohol use 2.4 oz/week    4 Shots of liquor per week      FAMILY HISTORY: No family history on file.   MEDICATIONS AT HOME: Prior to Admission medications   Medication Sig Start Date End Date Taking? Authorizing Provider  amantadine (SYMMETREL) 100 MG capsule Take 100 mg by mouth daily. 07/09/15  Yes Historical Provider, MD  carbidopa-levodopa (SINEMET IR) 25-100 MG per tablet Take 0.5-1 tablets by mouth See admin instructions. Take 1 tablet orally two times a day at 7am, and 10pm. Take 0.5 tablet orally at 10am, 1pm, 4pm, and 7pm. 07/20/15  Yes Historical Provider, MD  etodolac (LODINE) 200 MG capsule Take 200 mg by mouth every 8 (eight) hours.   Yes Historical Provider, MD  losartan (COZAAR) 50 MG tablet Take 50 mg by mouth daily. 07/07/15  Yes Historical Provider, MD  omeprazole (PRILOSEC OTC) 20 MG tablet Take 20 mg by mouth 2 (two) times daily.   Yes Historical Provider, MD  rOPINIRole (REQUIP) 2 MG  tablet Take 2 mg by mouth 6 (six) times daily. At 7am, and 1pm. 07/20/15  Yes Historical Provider, MD  selegiline (ELDEPRYL) 5 MG capsule Take 5 mg by mouth 2 (two) times daily. At 7am and 1pm. 07/09/15  Yes Historical Provider, MD  traMADol (ULTRAM) 50 MG tablet Take 50 mg by mouth every 6 (six) hours as needed.   Yes Historical Provider, MD  traZODone  (DESYREL) 150 MG tablet Take 150 mg by mouth at bedtime. 06/18/15  Yes Historical Provider, MD      DRUG ALLERGIES: Allergies  Allergen Reactions  . Phenergan [Promethazine Hcl] Other (See Comments)    Unknown   . Reglan [Metoclopramide] Other (See Comments)    Pass out      REVIEW OF SYSTEMS: CONSTITUTIONAL: No fatigue, weakness, fever, chills, weight gain/loss, headache EYES: No blurry or double vision. ENT: No tinnitus, postnasal drip, redness or soreness of the oropharynx. RESPIRATORY: No dyspnea, cough, wheeze, hemoptysis. CARDIOVASCULAR:Positive chest pain, orthopnea, palpitations, syncope. GASTROINTESTINAL: No nausea, vomiting, constipation, diarrhea, abdominal pain. No hematemesis, melena or hematochezia. GENITOURINARY: No dysuria, frequency, hematuria. ENDOCRINE: No polyuria or nocturia. No heat or cold intolerance. HEMATOLOGY: No anemia, bruising, bleeding. INTEGUMENTARY: No rashes, ulcers, lesions. MUSCULOSKELETAL: No pain, arthritis, swelling, gout. NEUROLOGIC: No numbness, tingling, weakness or ataxia. No seizure-type activity. PSYCHIATRIC: Positive anxiety, depression, insomnia.  PHYSICAL EXAMINATION: VITAL SIGNS: Blood pressure (!) 153/83, pulse 79, temperature 98 F (36.7 C), temperature source Oral, resp. rate 18, height 5\' 8"  (1.727 m), weight 72.6 kg (160 lb), SpO2 96 %.  GENERAL: 65 y.o.-year-old white male patient, well-developed, well-nourished . Patient is tearful and has difficulty conversing.  Pleasant and cooperative.   HEENT: Head atraumatic, normocephalic. Pupils equal, round, reactive to light and accommodation. No scleral icterus. Extraocular muscles intact. Oropharynx is clear. Mucus membranes moist. NECK: Supple, full range of motion. No JVD, no bruit heard. No cervical lymphadenopathy. CHEST: Normal breath sounds bilaterally. No wheezing, rales, rhonchi or crackles. No use of accessory muscles of respiration.  No reproducible chest wall tenderness.  He has a neurostimulator in the left chest wall. CARDIOVASCULAR: S1, S2 normal. No murmurs, rubs, or gallops appreciated. Cap refill <2 seconds. ABDOMEN: Soft, nontender, nondistended. No rebound, guarding, rigidity. Normoactive bowel sounds present in all four quadrants. No organomegaly or mass. EXTREMITIES: Full range of motion. No pedal edema, cyanosis, or clubbing. Negative Homans sign, negative calf tenderness.   NEUROLOGIC: Cranial nerves II through XII are grossly intact with no focal sensorimotor deficit. Muscle strength 5/5 in all extremities. Sensation intact. Gait not checked. PSYCHIATRIC: The patient is alert and oriented x 3. Patient is tearful with depressed mood and flattened affect. SKIN: Warm, dry, and intact without obvious rash, lesion, or ulcer.  LABORATORY PANEL:  CBC  Recent Labs Lab 08/14/16 1718  WBC 9.3  HGB 15.9  HCT 46.9  PLT 251   ----------------------------------------------------------------------------------------------------------------- Chemistries  Recent Labs Lab 08/14/16 1718  Charles 137  K 3.8  CL 101  CO2 28  GLUCOSE 106*  BUN 21*  CREATININE 1.02  CALCIUM 9.3  AST 23  ALT 16*  ALKPHOS 114  BILITOT 1.5*   ------------------------------------------------------------------------------------------------------------------ Cardiac Enzymes  Recent Labs Lab 08/14/16 1718  TROPONINI <0.03   ------------------------------------------------------------------------------------------------------------------  RADIOLOGY: Dg Chest 2 View  Result Date: 08/14/2016 CLINICAL DATA:  Pt to ED c/o chest tightness for the last hour with shortness of breath; h/o htn, parkinson's disease EXAM: CHEST  2 VIEW COMPARISON:  07/25/2015 FINDINGS: Cardiac silhouette is normal in size. No mediastinal  or hilar masses or evidence of adenopathy. Lungs are clear.  No pleural effusion or pneumothorax. There changes from previous left shoulder surgery and a lumbar  spine posterior fusion, stable. IMPRESSION: No acute cardiopulmonary disease. Electronically Signed   By: Lajean Manes M.D.   On: 08/14/2016 17:52    EKG: Normal sinus rhythm at 89 bpm with normal axis and nonspecific ST-T wave changes. These note EKG is a poor image quality secondary to wandering baseline, Parkinson's and palpable interference from nerve stimulator.  IMPRESSION AND PLAN:  This is a 65 y.o. male with a history of Parkinson's and hypertension now being admitted with: 1. Chest pain rule out ACS-admit to observation with telemetry monitoring, trend troponins, check TSH and lipids. Cardiology consultation. Patient received aspirin in the emergency department.  Add aspirin 81 daily. Continue antihypertensives 2. Anxiety and depression, likely related to patient's social situation-we'll request both psychiatric consultation and social work involvement for consideration of alternative home placement. 3. History of Parkinson's disease-continue amantadine, carbidopa levodopa, Requip, selegiline, trazodone 4. History of right leg pain-continue tramadol. PT eval and treat.  5. History of hypertension-continue Cozaar.    Diet/Nutrition: Cardiac Fluids: IVNS DVT Px: Lovenox, SCDs and early ambulation Code Status: Full  All the records are reviewed and case discussed with ED provider. Management plans discussed with the patient and/or family who express understanding and agree with plan of care.   TOTAL TIME TAKING CARE OF THIS PATIENT: 60 minutes.   Mariane Burpee D.O. on 08/14/2016 at 7:53 PM Between 7am to 6pm - Pager - [phone] After 6pm go to [email or web address] - [employer] [facility] [place] [facility] Office [phone] CC: Primary care physician; Idelle Crouch, MD     Note: This dictation was prepared with Dragon dictation along with smaller phrase technology. Any transcriptional errors that result from this process are unintentional.

## 2016-08-14 NOTE — ED Notes (Signed)
Patient transported to radiology

## 2016-08-14 NOTE — ED Notes (Signed)
Patient took off all monitoring leads. Refusing vital signs to be taken at this time.

## 2016-08-14 NOTE — Progress Notes (Signed)
Patient arrived to 2A Room 239. Patient denies pain and all questions answered. Patient oriented to unit and educated on how to call nursing staff for assistance. Skin assessment completed with Lexi RN and skin intact. A&Ox4, VSS, and NSR on verified tele-box #[id number]. Call bell within reach, siderails up x2, yellow socks placed, and bed alarm on. Nursing staff will continue to monitor for any changes in patient status. Earleen Reaper, RN

## 2016-08-14 NOTE — ED Notes (Signed)
Patient threatening to leave AMA. Patient states, "Get this shit off of me. I'm leaving". MD notified and aware.

## 2016-08-14 NOTE — ED Provider Notes (Signed)
Time Seen: Approximately 1744  I have reviewed the triage notes  Chief Complaint: Chest Pain   History of Present Illness: Charles Choi is a 65 y.o. male who states he's had chest pain lasting approximately an hour prior to arrival. He states it feels like a chest tightness without radiation to the arm or jaw area. He denies much in way of shortness of breath, nausea, vomiting. He denies any obvious pleuritic or positional component with his pain. Patient has a significant history of Parkinson's disease and has a deepstimulator. He also has been undergoing evaluation of some right knee discomfort which sounds like a Baker's cyst that was had fluid drainage from the joint. He states that he's had significant ongoing pain now for the last 2 months. He denies any fever or new leg swelling and states he has had Doppler studies done which did not show any evidence of a deep venous thrombosis Past Medical History:  Diagnosis Date  . Hypertension   . Parkinson's disease [facility])     Patient Active Problem List   Diagnosis Date Noted  . Chest pain due to myocardial ischemia ([place]) 08/14/2016    Past Surgical History:  Procedure Laterality Date  . BACK SURGERY    . CHOLECYSTECTOMY    . JOINT REPLACEMENT     left knee x2    Past Surgical History:  Procedure Laterality Date  . BACK SURGERY    . CHOLECYSTECTOMY    . JOINT REPLACEMENT     left knee x2    Current Outpatient Rx  . Order #: [id number] Class: Historical Med  . Order #: [id number] Class: Historical Med  . Order #: [id number] Class: Historical Med  . Order #: [id number] Class: Historical Med  . Order #: [id number] Class: Historical Med  . Order #: [id number] Class: Historical Med  . Order #: [id number] Class: Historical Med  . Order #: [id number] Class: Historical Med  . Order #[id number] Class: Historical Med    Allergies:  Phenergan [promethazine hcl] and Reglan [metoclopramide]  Family History: No family history on file.  Social  History: Social History  Substance Use Topics  . Smoking status: Never Smoker  . Smokeless tobacco: Never Used  . Alcohol use 2.4 oz/week    4 Shots of liquor per week     Review of Systems:   10 point review of systems was performed and was otherwise negative:  Constitutional: No fever Eyes: No visual disturbances ENT: No sore throat, ear pain Cardiac: No Current chest pain Respiratory: No shortness of breath, wheezing, or stridor Abdomen: No abdominal pain, no vomiting, No diarrhea Endocrine: No weight loss, No night sweats Extremities: No peripheral edema, cyanosis Skin: No rashes, easy bruising Neurologic: No focal weakness, trouble with speech or swollowing Urologic: No dysuria, Hematuria, or urinary frequency   Physical Exam:  ED Triage Vitals  Enc Vitals Group     BP 08/14/16 1715 130/79     Pulse Rate 08/14/16 1701 (!) 103     Resp 08/14/16 1701 20     Temp 08/14/16 1701 98 F (36.7 C)     Temp Source 08/14/16 1701 Oral     SpO2 08/14/16 1701 98 %     Weight 08/14/16 1702 160 lb (72.6 kg)     Height 08/14/16 1702 5\' 8"  (1.727 m)     Head Circumference --      Peak Flow --      Pain Score 08/14/16 1702 4     Pain Loc --  Pain Edu? --      Excl. in [place]? --     General: Awake , Alert , and Oriented times 3; GCS 15 Head: Normal cephalic , atraumatic Eyes: Pupils equal , round, reactive to light Nose/Throat: No nasal drainage, patent upper airway without erythema or exudate.  Neck: Supple, Full range of motion, No anterior adenopathy or palpable thyroid masses Lungs: Clear to ascultation without wheezes , rhonchi, or rales Heart: Regular rate, regular rhythm without murmurs , gallops , or rubs Abdomen: Soft, non tender without rebound, guarding , or rigidity; bowel sounds positive and symmetric in all 4 quadrants. No organomegaly .        Extremities: 2 plus symmetric pulses. No edema, clubbing or cyanosis. Old surgical wounds especially in the left knee  and left shoulder region. No significant calf tenderness or neck asymmetric swelling Neurologic: normal ambulation, Motor symmetric without deficits, sensory intact Skin: warm, dry, no rashes No reproducible chest wall pain.  Labs:   All laboratory work was reviewed including any pertinent negatives or positives listed below:  Labs Reviewed  CBC - Abnormal; Notable for the following:       Result Value   RDW 15.0 (*)    All other components within normal limits  COMPREHENSIVE METABOLIC PANEL - Abnormal; Notable for the following:    Glucose, Bld 106 (*)    BUN 21 (*)    ALT 16 (*)    Total Bilirubin 1.5 (*)    All other components within normal limits  TROPONIN I  Laboratory work was reviewed and showed no clinically significant abnormalities.   EKG:  ED ECG REPORT I, Daymon Larsen, the attending physician, personally viewed and interpreted this ECG.  Date: 08/14/2016 EKG Time: *1713 Rate: 89 Rhythm: normal sinus rhythm QRS Axis: normal Intervals: normal ST/T Wave abnormalities: Nonspecific ST-T wave abnormalities Conduction Disturbances: none Narrative Interpretation: unremarkable Poor quality EKG   Radiology:  "Dg Chest 2 View  Result Date: 08/14/2016 CLINICAL DATA:  Pt to ED c/o chest tightness for the last hour with shortness of breath; h/o htn, parkinson's disease EXAM: CHEST  2 VIEW COMPARISON:  07/25/2015 FINDINGS: Cardiac silhouette is normal in size. No mediastinal or hilar masses or evidence of adenopathy. Lungs are clear.  No pleural effusion or pneumothorax. There changes from previous left shoulder surgery and a lumbar spine posterior fusion, stable. IMPRESSION: No acute cardiopulmonary disease. Electronically Signed   By: Lajean Manes M.D.   On: 08/14/2016 17:52   Ct Knee Right Wo Contrast  Result Date: 07/21/2016 CLINICAL DATA:  Continuous right knee pain post fall 1 month prior. EXAM: CT OF THE RIGHT KNEE WITHOUT CONTRAST TECHNIQUE: Multidetector CT  imaging of the RIGHT knee was performed according to the standard protocol. Multiplanar CT image reconstructions were also generated. COMPARISON:  Radiograph 07/05/2016 FINDINGS: Bones/Joint/Cartilage Articular defect of the posterior medial tibial plateau is well corticated and consistent with remote prior fracture. Small osseous fragments peripherally. Medial tibial femoral joint space narrowing and spurring. No evidence of acute fracture. Well corticated defects in the central tibial spine is chronic and just posterior to the ACL insertion. Moderate patellofemoral joint space narrowing and spurring. Moderately large complex joint effusion with synovial thickening and complex intra-articular debris. Proximal tibia-fibular osteoarthritis. Ligaments Suboptimally assessed by CT. The anterior and posterior cruciate ligaments are not well-defined. Muscles and Tendons There is a complex intramuscular collection within the posterior compartments of the calf. Exact size measurements difficult due to the segmental acquisition of  the exam, however measures at least 5.2 x 2.6 x greater than 12 cm in craniocaudal dimension. This is primary low-density with mild heterogeneity. Irregular fluid also noted in the more medial posterior compartment which may be an irregular Baker cyst. Soft tissues Subcutaneous soft tissue edema is seen. IMPRESSION: 1. Heterogeneous complex joint effusion with synovial thickening and intra-articular debris. Fluid in the medial knee may reflect Baker cyst, +/-rupture. There is an ill-defined intramuscular fluid collection in the posterior lateral compartment of the calf that is a elongated measuring greater than 12 cm than the setting of injury is likely a liquefying hematoma, infected fluid collection or abscess not excluded. Recommend continued clinical follow-up to exclude cystic mass, felt less likely given recent injury. 2. 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 Remote fracture of the medial tibial plateau. 3. MRI recommended for evaluation of internal derangement of the knee. Electronically Signed   By: Jeb Levering M.D.   On: 07/21/2016 22:13   [place] Extrem Low Right Ltd  Result Date: 08/04/2016 INDICATION: Right calf seroma. EXAM: US ASPIRATION MEDICATIONS: None. ANESTHESIA/SEDATION: None. COMPLICATIONS: None immediate. PROCEDURE: Informed written consent was obtained from the patient after a thorough discussion of the procedural risks, benefits and alternatives. All questions were addressed. Sterile Barrier Technique was utilized including mask, sterile gloves, sterile drape, hand hygiene and skin antiseptic. A timeout was performed prior to the initiation of the procedure. Patient was placed prone on stretcher. Posterior portion of right calf was prepped and draped using sterile technique. Local anesthetic was applied. Under real-time ultrasound guidance, 18 gauge spinal needle was directed into large fluid collections seen posteriorly in right calf. Approximately 75 mL of serosanguineous fluid was aspirated. Needle was removed and appropriate dressing was applied. Followup ultrasound demonstrates complete decompression of fluid collection. IMPRESSION: Under real-time ultrasound guidance, successful aspiration and decompression of right calf seroma. Electronically Signed   By: Marijo Conception, M.D.   On: 08/02/2016 12:29   US Aspiration  Result Date: 08/02/2016 INDICATION: Right calf seroma. EXAM: US ASPIRATION MEDICATIONS: None. ANESTHESIA/SEDATION: None. COMPLICATIONS: None immediate. PROCEDURE: Informed written consent was obtained from the patient after a thorough discussion of the procedural risks, benefits and alternatives. All questions were addressed. Sterile Barrier Technique was utilized including mask, sterile gloves, sterile drape, hand hygiene and skin antiseptic. A timeout was performed prior to the initiation of the procedure.  Patient was placed prone on stretcher. Posterior portion of right calf was prepped and draped using sterile technique. Local anesthetic was applied. Under real-time ultrasound guidance, 18 gauge spinal needle was directed into large fluid collections seen posteriorly in right calf. Approximately 75 mL of serosanguineous fluid was aspirated. Needle was removed and appropriate dressing was applied. Followup ultrasound demonstrates complete decompression of fluid collection. IMPRESSION: Under real-time ultrasound guidance, successful aspiration and decompression of right calf seroma. Electronically Signed   By: Marijo Conception, M.D.   On: 08/02/2016 12:29  "  I personally reviewed the radiologic studies   ED Course: * Differential includes all life-threatening causes for chest pain. This includes but is not exclusive to acute coronary syndrome, aortic dissection, pulmonary embolism, cardiac tamponade, community-acquired pneumonia, pericarditis, musculoskeletal chest wall pain, etc.  Given the patient's presentation and objective findings he does have some risk factors for cardiovascular disease and is noted received a cardiac evaluation as well as echocardiogram, etc. He states he has had EKGs in the past. His pain is resolved at this time and he was  given aspirin here in emergency department. Given his age and risk factors including hypertension and family history that he should stay for further objective studies. Patient initially was going to leave [place] and was very upset and bedside discussion with him he states he doesn't feel this is a life-threatening chest pain though he understands we don't know exactly the cause and there is concern with his risk factors for cardiovascular disease. Patient states he's had some issues and stress situations at home and now wishes to comply with inpatient evaluation. Clinical Course     Assessment:  Acute unspecified chest pain   Final  Clinical Impression  Final diagnoses:  Nonspecific chest pain     Plan:  Inpatient           Daymon Larsen, MD 08/14/16 1948

## 2016-08-15 ENCOUNTER — Observation Stay (HOSPITAL_BASED_OUTPATIENT_CLINIC_OR_DEPARTMENT_OTHER)
Admit: 2016-08-15 | Discharge: 2016-08-15 | Disposition: A | Discharge status: Home / Self Care | Payer: 59 | Attending: Internal Medicine | Admitting: Internal Medicine

## 2016-08-15 DIAGNOSIS — R079 Chest pain, unspecified: Secondary | ICD-10-CM

## 2016-08-15 DIAGNOSIS — F331 Major depressive disorder, recurrent, moderate: Secondary | ICD-10-CM

## 2016-08-15 LAB — CBC
HCT: 42.2 % (ref 40.0–52.0)
Hemoglobin: 14.4 g/dL (ref 13.0–18.0)
MCH: 28.8 pg (ref 26.0–34.0)
MCHC: 34.2 g/dL (ref 32.0–36.0)
MCV: 84 fL (ref 80.0–100.0)
PLATELETS: 217 10*3/uL (ref 150–440)
RBC: 5.02 MIL/uL (ref 4.40–5.90)
RDW: 14.5 % (ref 11.5–14.5)
WBC: 7.3 10*3/uL (ref 3.8–10.6)

## 2016-08-15 LAB — BASIC METABOLIC PANEL
Anion gap: 5 (ref 5–15)
BUN: 19 mg/dL (ref 6–20)
CALCIUM: 8.2 mg/dL — AB (ref 8.9–10.3)
CO2: 26 mmol/L (ref 22–32)
CREATININE: 0.94 mg/dL (ref 0.61–1.24)
Chloride: 107 mmol/L (ref 101–111)
GFR calc Af Amer: >60 mL/min (ref >60)
GFR calc non Af Amer: >60 mL/min (ref >60)
Glucose, Bld: 101 mg/dL — ABNORMAL HIGH (ref 65–99)
Potassium: 3.6 mmol/L (ref 3.5–5.1)
SODIUM: 138 mmol/L (ref 135–145)

## 2016-08-15 LAB — TROPONIN I
Troponin I: <0.03 ng/mL (ref <0.03)
Troponin I: <0.03 ng/mL (ref <0.03)

## 2016-08-15 LAB — ECHOCARDIOGRAM COMPLETE
Height: 68 in
WEIGHTICAEL: 2366.4 [oz_av]

## 2016-08-15 MED ORDER — ESCITALOPRAM OXALATE 10 MG PO TABS: 10.0000 mg | ORAL_TABLET | Freq: Every day | ORAL | 1 refills | Status: DC

## 2016-08-15 NOTE — Evaluation (Signed)
Physical Therapy Evaluation Patient Details Name: Charles Choi MRN: [id number] DOB: December 27, 1950 Today's Date: 08/15/2016   History of Present Illness  Pt is a 65 y.o. male presenting to hospital with chest pain (now resolved).  Of note, pt with h/o R leg pain (recent h/o being drained d/t Baker's cyst) and fall several months ago with ongoing L leg pain.  PMH includes Parkinson's disease with DBS, htn, back surgery, L TKR x2.    Clinical Impression  Prior to admission, pt was modified independent ambulating with rollator.  Pt lives with his mother on main level of home with 3 stairs to enter.  Currently pt is modified independent with bed mobility and SBA to CGA with transfers and ambulation household distances with rollator.  Pt demonstrates antalgic gait due to prior R knee pain.  Pt would benefit from skilled PT to address noted impairments and functional limitations.  Recommend pt discharge to home with support of family when medically appropriate.     Follow Up Recommendations Home health PT    Equipment Recommendations   (pt already owns rollator)    Recommendations for Other Services       Precautions / Restrictions Precautions Precautions: Fall Required Braces or Orthoses:  (Order for R knee brace in system but none in room (MD Kalisetti cleared verbally to PT to allow pt up ambulating without brace)) Restrictions Weight Bearing Restrictions: No      Mobility  Bed Mobility Overal bed mobility: Modified Independent Bed Mobility: Supine to Sit;Sit to Supine     Supine to sit: Modified independent (Device/Increase time) Sit to supine: Modified independent (Device/Increase time)   General bed mobility comments: increased time to perform  Transfers Overall transfer level: Needs assistance Equipment used: 4-wheeled walker Transfers: Sit to/from Stand Sit to Stand: Supervision;Min guard         General transfer comment: pt required multiple attempts to stand but  eventually was able to perform without any assist  Ambulation/Gait Ambulation/Gait assistance: Supervision;Min guard Ambulation Distance (Feet):  (20 feet; 75 feet) Assistive device: 4-wheeled walker   Gait velocity: decreased   General Gait Details: decreased stance time R LE (antalgic); L LE externally rotated; steady without loss of balance; limited distance d/t R knee pain  Stairs Stairs:  (pt declined)          Wheelchair Mobility    Modified Rankin (Stroke Patients Only)       Balance Overall balance assessment: History of Falls;Needs assistance Sitting-balance support: No upper extremity supported;Feet supported Sitting balance-Leahy Scale: Good     Standing balance support: Bilateral upper extremity supported (on rollator) Standing balance-Leahy Scale: Good                               Pertinent Vitals/Pain Pain Assessment: 0-10 Pain Score: 6  (2/10 at rest beginning and end of session; 6/10 with ambulation) Pain Location: R knee Pain Descriptors / Indicators: Tender;Sore;Sharp;Shooting Pain Intervention(s): Limited activity within patient's tolerance;Monitored during session;Repositioned  See flow sheet for HR and O2 vitals.    Home Living Family/patient expects to be discharged to:: Private residence Living Arrangements: Parent (mother)   Type of Home: House Home Access: Stairs to enter Entrance Stairs-Rails: Right (also braces on brick wall on L side) Entrance Stairs-Number of Steps: 3 Home Layout: Multi-level;Able to live on main level with bedroom/bathroom Home Equipment: Walker - 4 wheels      Prior Function Level of Independence:  Independent with assistive device(s)         Comments: Pt reports 2 falls in past 6 months     Hand Dominance        Extremity/Trunk Assessment   Upper Extremity Assessment: Generalized weakness           Lower Extremity Assessment: Generalized weakness (AROM WFL (once pt "warmed  up"))         Communication   Communication: No difficulties  Cognition Arousal/Alertness: Awake/alert Behavior During Therapy: WFL for tasks assessed/performed Overall Cognitive Status: Within Functional Limits for tasks assessed                      General Comments General comments (skin integrity, edema, etc.): Pt resting in bed and agreeable to PT session with some encouragement.  Nursing cleared pt for participation in physical therapy.    Exercises     Assessment/Plan    PT Assessment Patient needs continued PT services  PT Problem List Decreased activity tolerance;Decreased balance;Decreased mobility;Pain          PT Treatment Interventions DME instruction;Gait training;Stair training;Functional mobility training;Therapeutic activities;Therapeutic exercise;Balance training;Patient/family education    PT Goals (Current goals can be found in the Care Plan section)  Acute Rehab PT Goals Patient Stated Goal: to go home PT Goal Formulation: With patient Time For Goal Achievement: 08/29/16 Potential to Achieve Goals: Good    Frequency Min 2X/week   Barriers to discharge        Co-evaluation               End of Session Equipment Utilized During Treatment: Gait belt Activity Tolerance: Patient limited by pain Patient left: in bed;with call bell/phone within reach;with bed alarm set;with nursing/sitter in room Nurse Communication: Mobility status;Precautions    Functional Assessment Tool Used: AM-PAC without stairs Functional Limitation: Mobility: Walking and moving around Mobility: Walking and Moving Around Current Status [id number]): At least 20 percent but less than 40 percent impaired, limited or restricted Mobility: Walking and Moving Around Goal Status [phone]): At least 1 percent but less than 20 percent impaired, limited or restricted    Time: 1440-1500 PT Time Calculation (min) (ACUTE ONLY): 20 min   Charges:   PT Evaluation $PT Eval Low  Complexity: 1 Procedure     PT G Codes:   PT G-Codes **NOT FOR INPATIENT CLASS** Functional Assessment Tool Used: AM-PAC without stairs Functional Limitation: Mobility: Walking and moving around Mobility: Walking and Moving Around Current Status [id number]): At least 20 percent but less than 40 percent impaired, limited or restricted Mobility: Walking and Moving Around Goal Status [phone]): At least 1 percent but less than 20 percent impaired, limited or restricted    [facility] 08/15/2016, 5:40 PM Leitha Bleak, [place]

## 2016-08-15 NOTE — Discharge Summary (Signed)
[facility] NAME: Charles Choi    MR#:  [id number]  DATE OF BIRTH:  September 06, 1951  DATE OF ADMISSION:  08/14/2016   ADMITTING PHYSICIAN: [name], DO  DATE OF DISCHARGE: 08/15/16  PRIMARY CARE PHYSICIAN: SPARKS,JEFFREY D, MD   ADMISSION DIAGNOSIS:   Nonspecific chest pain [R07.9]  DISCHARGE DIAGNOSIS:   Principal Problem:   Moderate recurrent major depression (HCC) Active Problems:   Chest pain due to myocardial ischemia (HCC)   Chest pain, rule out acute myocardial infarction   SECONDARY DIAGNOSIS:   Past Medical History:  Diagnosis Date  . Hypertension   . Parkinson's disease [facility])     HOSPITAL COURSE:   65y/o M with PMH of hypertension, parkinsons disease and recent fall and right knee swelling presents to hospital secondary to chest pain  #1 Chest pain- from anxiety/ panic attack - resolved now - troponins negative x 3 - ECHO normal, EF 65%  #2 Depression/anxiety- social stressors in life - not suicidal or homicidal - Appreciate psych consult. Started on Lexapro 10 mg daily. Outpatient psychiatry follow-up  #3 HTN- continue home medications- on losartan  #4 Parkinsons disease- cont home meds- sinemet, amantadine, selegiline  #5 Right knee pain- following with Dr. Roland Rack as outpatient, just saw him last week, had bakers cyst drained and also right knee osteoarthritis with fluid collection drained. Recent steroid injection as well. - outpatient follow up recommended  Discharge today.   DISCHARGE CONDITIONS:   Guarded  CONSULTS OBTAINED:   Treatment Team:  Gonzella Lex, MD  DRUG ALLERGIES:   Allergies  Allergen Reactions  . Phenergan [Promethazine Hcl] Other (See Comments)    Unknown   . Reglan [Metoclopramide] Other (See Comments)    Pass out    DISCHARGE MEDICATIONS:     Medication List    TAKE these medications   amantadine 100 MG capsule Commonly known as:   SYMMETREL Take 100 mg by mouth daily.   carbidopa-levodopa 25-100 MG tablet Commonly known as:  SINEMET IR Take 0.5-1 tablets by mouth See admin instructions. Take 1 tablet orally two times a day at 7am, and 10pm. Take 0.5 tablet orally at 10am, 1pm, 4pm, and 7pm.   escitalopram 10 MG tablet Commonly known as:  LEXAPRO Take 1 tablet (10 mg total) by mouth daily.   etodolac 200 MG capsule Commonly known as:  LODINE Take 200 mg by mouth every 8 (eight) hours.   losartan 50 MG tablet Commonly known as:  COZAAR Take 50 mg by mouth daily.   omeprazole 20 MG tablet Commonly known as:  PRILOSEC OTC Take 20 mg by mouth 2 (two) times daily.   rOPINIRole 2 MG tablet Commonly known as:  REQUIP Take 2 mg by mouth 6 (six) times daily. At 7am, and 1pm.   selegiline 5 MG capsule Commonly known as:  ELDEPRYL Take 5 mg by mouth 2 (two) times daily. At 7am and 1pm.   traMADol 50 MG tablet Commonly known as:  ULTRAM Take 50 mg by mouth every 6 (six) hours as needed.   traZODone 150 MG tablet Commonly known as:  DESYREL Take 150 mg by mouth at bedtime.        DISCHARGE INSTRUCTIONS:   1. PCP f/u in 1-2 weeks 2. Orthopedic follow up in 1 week 3. Psychiatry f/u in 3 weeks  DIET:   Cardiac diet  ACTIVITY:   Activity as tolerated  OXYGEN:   Home Oxygen: No.  Oxygen Delivery: room air  DISCHARGE LOCATION:   home   If you experience worsening of your admission symptoms, develop shortness of breath, life threatening emergency, suicidal or homicidal thoughts you must seek medical attention immediately by calling 911 or calling your MD immediately  if symptoms less severe.  You Must read complete instructions/literature along with all the possible adverse reactions/side effects for all the Medicines you take and that have been prescribed to you. Take any new Medicines after you have completely understood and accpet all the possible adverse reactions/side effects.   Please  note  You were cared for by a hospitalist during your hospital stay. If you have any questions about your discharge medications or the care you received while you were in the hospital after you are discharged, you can call the unit and asked to speak with the hospitalist on call if the hospitalist that took care of you is not available. Once you are discharged, your primary care physician will handle any further medical issues. Please note that NO REFILLS for any discharge medications will be authorized once you are discharged, as it is imperative that you return to your primary care physician (or establish a relationship with a primary care physician if you do not have one) for your aftercare needs so that they can reassess your need for medications and monitor your lab values.    On the day of Discharge:  VITAL SIGNS:   Blood pressure 133/80, pulse 81, temperature 98.4 F (36.9 C), temperature source Oral, resp. rate 18, height 5\' 8"  (1.727 m), weight 67.1 kg (147 lb 14.4 oz), SpO2 98 %.  PHYSICAL EXAMINATION:    GENERAL:  65 y.o.-year-old patient lying in the bed with no acute distress.  EYES: Pupils equal, round, reactive to light and accommodation. No scleral icterus. Extraocular muscles intact.  HEENT: Head atraumatic, normocephalic. Oropharynx and nasopharynx clear.  NECK:  Supple, no jugular venous distention. No thyroid enlargement, no tenderness.  LUNGS: Normal breath sounds bilaterally, no wheezing, rales,rhonchi or crepitation. No use of accessory muscles of respiration.  CARDIOVASCULAR: S1, S2 normal. No murmurs, rubs, or gallops.  ABDOMEN: Soft, nontender, nondistended. Bowel sounds present. No organomegaly or mass.  EXTREMITIES: right knee swelling, no erythema- swelling on medial and lateral sides. No pedal edema, cyanosis, or clubbing.  NEUROLOGIC: Cranial nerves II through XII are intact. Muscle strength 5/5 in all extremities. Sensation intact. Gait not checked.    PSYCHIATRIC: The patient is alert and oriented x 3.  SKIN: No obvious rash, lesion, or ulcer.    DATA REVIEW:   CBC  Recent Labs Lab 08/15/16 0541  WBC 7.3  HGB 14.4  HCT 42.2  PLT 217    Chemistries   Recent Labs Lab 08/14/16 1718 08/15/16 0541  NA 137 138  K 3.8 3.6  CL 101 107  CO2 28 26  GLUCOSE 106* 101*  BUN 21* 19  CREATININE 1.02 0.94  CALCIUM 9.3 8.2*  MG 2.3  --   AST 23  --   ALT 16*  --   ALKPHOS 114  --   BILITOT 1.5*  --      Microbiology Results  No results found for this or any previous visit.  RADIOLOGY:  Dg Chest 2 View  Result Date: 08/14/2016 CLINICAL DATA:  Pt to ED c/o chest tightness for the last hour with shortness of breath; h/o htn, parkinson's disease EXAM: CHEST  2 VIEW COMPARISON:  07/25/2015 FINDINGS: Cardiac silhouette is normal in size. No  mediastinal or hilar masses or evidence of adenopathy. Lungs are clear.  No pleural effusion or pneumothorax. There changes from previous left shoulder surgery and a lumbar spine posterior fusion, stable. IMPRESSION: No acute cardiopulmonary disease. Electronically Signed   By: Lajean Manes M.D.   On: 08/14/2016 17:52     Management plans discussed with the patient, family and they are in agreement.  CODE STATUS:     Code Status Orders        Start     Ordered   08/14/16 2114  Full code  Continuous     08/14/16 2113    Code Status History    Date Active Date Inactive Code Status Order ID Comments User Context   This patient has a current code status but no historical code status.      TOTAL TIME TAKING CARE OF THIS PATIENT: 37 minutes.    Joanette Silveria M.D on 08/15/2016 at 1:51 PM  Between 7am to 6pm - Pager - [phone]  After 6pm go to [email or web address] - Proofreader  [facility]  Office  [phone]  CC: Primary care physician; Idelle Crouch, MD   Note: This dictation was prepared with Dragon dictation along with smaller  phrase technology. Any transcriptional errors that result from this process are unintentional.

## 2016-08-15 NOTE — Care Management (Signed)
No discharge needs identified.  He does say that he is not going to live with his wife anymore due to the long standing ongoing hoarding environment.  Says is going to find some where else to live and get his daughter and 64 year old grandson that is currently living the the hoarded home environment to come live with him .  he says he wishes there is something he could do to get his grandson out of the environment.  He says he can not call social services.  His wife is a good grandmother.  Notified CSW

## 2016-08-15 NOTE — Progress Notes (Signed)
  Echocardiogram 2D Echocardiogram has been performed.  Jennette Dubin 08/15/2016, 10:59 AM

## 2016-08-15 NOTE — Care Management Obs Status (Signed)
[facility] NOTIFICATION   Patient Details  Name: DEONTRE DEGIORGIO MRN: [id number] Date of Birth: 11-Aug-1951   Medicare Observation Status Notification Given:  Yes   Medicare is secondary    Katrina Stack, RN 08/15/2016, 11:46 AM

## 2016-08-15 NOTE — Consult Note (Signed)
[facility] Face-to-Face Psychiatry Consult   Reason for Consult:  Consult for this 65 year old man with a history of Parkinson's disease currently in the hospital for chest pain. Question about depression. Referring Physician:  Tressia Miners Patient Identification: Charles Choi MRN:  [id number] Principal Diagnosis: Moderate recurrent major depression [facility]) Diagnosis:   Patient Active Problem List   Diagnosis Date Noted  . Moderate recurrent major depression ([place]) [F33.1] 08/15/2016  . Chest pain due to myocardial ischemia ([place]) [I20.9] 08/14/2016  . Chest pain, rule out acute myocardial infarction [R07.9] 08/14/2016    Total Time spent with patient: 1 hour  Subjective:   Charles Choi is a 65 y.o. male patient admitted with "I think it was a combination of physical mental and emotional".  HPI:  Patient interviewed. Chart reviewed. Labs and vitals reviewed. 65 year old man came into the hospital with chest pain. Patient knows that he has health problems but thinks that also a lot of the reasons for his worsening symptoms are emotional. He's been feeling more down emotionally depressed and anxious and it has been progressing over the last few months. Mood is going downhill all the time. Sleeps poorly at night. Wakes up after only a couple of hours and has trouble going back to sleep. Has not been eating well and has lost significant weight. He feels nervous and worried a lot of the time. Multiple stresses in his life include his relationship with his wife from whom he is separated. He describes his wife as being a Ship broker and says that their houses uninhabitable. He says on the other hand his wife would describe him as having behavior problems and that they cannot get along. He also says that he and his wife are already in bankruptcy and he worries about finances. Worries about his own health. He denies having any suicidal thought or plan. Denies any hallucinations. He is not currently seeing a psychiatrist  or getting any mental health treatment. Admits that he drinks intermittently and the amount that he describes as a little bit more than average but he does not think it's been a problem. Says he uses marijuana just a few times a month and denies other drug abuse.  Social history: Patient is on disability. He lives with his elderly mother. He does a little bit of work part time. He worries a lot about his wife and the condition of their house. Also worries a lot about his daughter and grandson who live with his wife.  Medical history: History of chest pain history of Parkinson's disease. Hypertension  Substance abuse history: He says that have been times in the recent past that he was drinking at a rate of about a 750 mL bottle a week of liquor. He doesn't think it's been a problem. Says he has other times when he is not drinking excessively. Uses marijuana occasionally which she thinks helps with his Parkinson's disease symptoms.  Past Psychiatric History: Patient saw a psychiatrist for panic attacks probably back in the 1970s. Hasn't had any psychiatric treatment anytime recently. Not currently by his report taking any psychiatric medicine although I do see that he's prescribed trazodone at night probably for sleep. Denies any history of any suicide attempts or violence or inpatient treatment.  Risk to Self: Is patient at risk for suicide?: No Risk to Others:   Prior Inpatient Therapy:   Prior Outpatient Therapy:    Past Medical History:  Past Medical History:  Diagnosis Date  . Hypertension   . Parkinson's disease (  [facility])     Past Surgical History:  Procedure Laterality Date  . BACK SURGERY    . CHOLECYSTECTOMY    . JOINT REPLACEMENT     left knee x2   Family History: No family history on file. Family Psychiatric  History: He says that his brother also has Parkinson's disease and there is also some family history of depression Social History:  History  Alcohol Use  . 2.4 oz/week  .  4 Shots of liquor per week     History  Drug Use  . Types: Marijuana    Social History   Social History  . Marital status: Married    Spouse name: N/A  . Number of children: N/A  . Years of education: N/A   Social History Main Topics  . Smoking status: Never Smoker  . Smokeless tobacco: Never Used  . Alcohol use 2.4 oz/week    4 Shots of liquor per week  . Drug use:     Types: Marijuana  . Sexual activity: Not Asked   Other Topics Concern  . None   Social History Narrative  . None   Additional Social History:    Allergies:   Allergies  Allergen Reactions  . Phenergan [Promethazine Hcl] Other (See Comments)    Unknown   . Reglan [Metoclopramide] Other (See Comments)    Pass out     Labs:  Results for orders placed or performed during the hospital encounter of 08/14/16 (from the past 48 hour(s))  CBC     Status: Abnormal   Collection Time: 08/14/16  5:18 PM  Result Value Ref Range   WBC 9.3 3.8 - 10.6 K/uL   RBC 5.62 4.40 - 5.90 MIL/uL   Hemoglobin 15.9 13.0 - 18.0 g/dL   HCT 46.9 40.0 - 52.0 %   MCV 83.5 80.0 - 100.0 fL   MCH 28.2 26.0 - 34.0 pg   MCHC 33.8 32.0 - 36.0 g/dL   RDW 15.0 (H) 11.5 - 14.5 %   Platelets 251 150 - 440 K/uL  Troponin I     Status: None   Collection Time: 08/14/16  5:18 PM  Result Value Ref Range   Troponin I <0.03 <0.03 ng/mL  Comprehensive metabolic panel     Status: Abnormal   Collection Time: 08/14/16  5:18 PM  Result Value Ref Range   Sodium 137 135 - 145 mmol/L   Potassium 3.8 3.5 - 5.1 mmol/L   Chloride 101 101 - 111 mmol/L   CO2 28 22 - 32 mmol/L   Glucose, Bld 106 (H) 65 - 99 mg/dL   BUN 21 (H) 6 - 20 mg/dL   Creatinine, Ser 1.02 0.61 - 1.24 mg/dL   Calcium 9.3 8.9 - 10.3 mg/dL   Total Protein 6.9 6.5 - 8.1 g/dL   Albumin 3.8 3.5 - 5.0 g/dL   AST 23 15 - 41 U/L   ALT 16 (L) 17 - 63 U/L   Alkaline Phosphatase 114 38 - 126 U/L   Total Bilirubin 1.5 (H) 0.3 - 1.2 mg/dL   GFR calc non Af Amer >60 >60 mL/min    GFR calc Af Amer >60 >60 mL/min    Comment: (NOTE) The eGFR has been calculated using the CKD EPI equation. This calculation has not been validated in all clinical situations. eGFR's persistently <60 mL/min signify possible Chronic Kidney Disease.    Anion gap 8 5 - 15  Magnesium     Status: None   Collection Time: 08/14/16  5:18 PM  Result Value Ref Range   Magnesium 2.3 1.7 - 2.4 mg/dL  Phosphorus     Status: None   Collection Time: 08/14/16  5:18 PM  Result Value Ref Range   Phosphorus 2.8 2.5 - 4.6 mg/dL  TSH     Status: None   Collection Time: 08/14/16  5:18 PM  Result Value Ref Range   TSH 3.489 0.350 - 4.500 uIU/mL    Comment: Performed by a 3rd Generation assay with a functional sensitivity of <=0.01 uIU/mL.  Troponin I     Status: None   Collection Time: 08/14/16 11:42 PM  Result Value Ref Range   Troponin I <0.03 <0.03 ng/mL  Basic metabolic panel     Status: Abnormal   Collection Time: 08/15/16  5:41 AM  Result Value Ref Range   Sodium 138 135 - 145 mmol/L   Potassium 3.6 3.5 - 5.1 mmol/L   Chloride 107 101 - 111 mmol/L   CO2 26 22 - 32 mmol/L   Glucose, Bld 101 (H) 65 - 99 mg/dL   BUN 19 6 - 20 mg/dL   Creatinine, Ser 0.94 0.61 - 1.24 mg/dL   Calcium 8.2 (L) 8.9 - 10.3 mg/dL   GFR calc non Af Amer >60 >60 mL/min   GFR calc Af Amer >60 >60 mL/min    Comment: (NOTE) The eGFR has been calculated using the CKD EPI equation. This calculation has not been validated in all clinical situations. eGFR's persistently <60 mL/min signify possible Chronic Kidney Disease.    Anion gap 5 5 - 15  CBC     Status: None   Collection Time: 08/15/16  5:41 AM  Result Value Ref Range   WBC 7.3 3.8 - 10.6 K/uL   RBC 5.02 4.40 - 5.90 MIL/uL   Hemoglobin 14.4 13.0 - 18.0 g/dL   HCT 42.2 40.0 - 52.0 %   MCV 84.0 80.0 - 100.0 fL   MCH 28.8 26.0 - 34.0 pg   MCHC 34.2 32.0 - 36.0 g/dL   RDW 14.5 11.5 - 14.5 %   Platelets 217 150 - 440 K/uL  Troponin I     Status: None    Collection Time: 08/15/16  5:41 AM  Result Value Ref Range   Troponin I <0.03 <0.03 ng/mL    Current Facility-Administered Medications  Medication Dose Route Frequency Provider Last Rate Last Dose  . 0.9 %  sodium chloride infusion   Intravenous Continuous Alexis Hugelmeyer, DO 75 mL/hr at 08/15/16 1027    . acetaminophen (TYLENOL) tablet 650 mg  650 mg Oral Q6H PRN Alexis Hugelmeyer, DO       Or  . acetaminophen (TYLENOL) suppository 650 mg  650 mg Rectal Q6H PRN Alexis Hugelmeyer, DO      . amantadine (SYMMETREL) capsule 100 mg  100 mg Oral Daily Alexis Hugelmeyer, DO   100 mg at 08/15/16 0612  . bisacodyl (DULCOLAX) EC tablet 5 mg  5 mg Oral Daily PRN Alexis Hugelmeyer, DO      . carbidopa-levodopa (SINEMET IR) 25-100 MG per tablet immediate release 1 tablet  1 tablet Oral BID Pernell Dupre, RPH   1 tablet at 08/15/16 0612   And  . carbidopa-levodopa (SINEMET IR) 25-100 MG per tablet immediate release 0.5 tablet  0.5 tablet Oral QID Sheema M Hallaji, RPH   0.5 tablet at 08/15/16 0929  . enoxaparin (LOVENOX) injection 40 mg  40 mg Subcutaneous Q24H Alexis Hugelmeyer, DO      .  etodolac (LODINE) capsule 200 mg  200 mg Oral Q8H Alexis Hugelmeyer, DO   200 mg at 08/15/16 0523  . HYDROcodone-acetaminophen (NORCO/VICODIN) 5-325 MG per tablet 1-2 tablet  1-2 tablet Oral Q4H PRN Alexis Hugelmeyer, DO   2 tablet at 08/14/16 2224  . losartan (COZAAR) tablet 50 mg  50 mg Oral Daily Alexis Hugelmeyer, DO   50 mg at 08/15/16 0928  . magnesium citrate solution 1 Bottle  1 Bottle Oral Once PRN Alexis Hugelmeyer, DO      . ondansetron (ZOFRAN) tablet 4 mg  4 mg Oral Q6H PRN Alexis Hugelmeyer, DO       Or  . ondansetron (ZOFRAN) injection 4 mg  4 mg Intravenous Q6H PRN Alexis Hugelmeyer, DO      . pantoprazole (PROTONIX) EC tablet 40 mg  40 mg Oral BID Alexis Hugelmeyer, DO   40 mg at 08/15/16 0928  . rOPINIRole (REQUIP) tablet 2 mg  2 mg Oral 6 X Daily Alexis Hugelmeyer, DO   2 mg at 08/15/16 0834  .  selegiline (ELDEPRYL) capsule 5 mg  5 mg Oral BID Alexis Hugelmeyer, DO   5 mg at 08/15/16 0612  . senna-docusate (Senokot-S) tablet 1 tablet  1 tablet Oral QHS PRN Alexis Hugelmeyer, DO      . sodium chloride flush (NS) 0.9 % injection 3 mL  3 mL Intravenous Q12H Alexis Hugelmeyer, DO   3 mL at 08/15/16 0934  . traMADol (ULTRAM) tablet 50 mg  50 mg Oral Q6H PRN Alexis Hugelmeyer, DO      . traZODone (DESYREL) tablet 150 mg  150 mg Oral QHS Alexis Hugelmeyer, DO   150 mg at 08/14/16 2224  . zolpidem (AMBIEN) tablet 5 mg  5 mg Oral QHS PRN,MR X 1 Alexis Hugelmeyer, DO   5 mg at 08/15/16 0569    Musculoskeletal: Strength & Muscle Tone: decreased Gait & Station: ataxic Patient leans: Backward  Psychiatric Specialty Exam: Physical Exam  Nursing note and vitals reviewed. Constitutional: He appears well-developed and well-nourished.  HENT:  Head: Normocephalic and atraumatic.  Eyes: Conjunctivae are normal. Pupils are equal, round, and reactive to light.  Neck: Normal range of motion.  Cardiovascular: Regular rhythm and normal heart sounds.   Respiratory: Effort normal.  GI: Soft.  Musculoskeletal: Normal range of motion.  Neurological: He is alert. He displays tremor.  Skin: Skin is warm and dry.  Psychiatric: His speech is normal and behavior is normal. Judgment normal. His mood appears anxious. Thought content is not paranoid. Cognition and memory are normal. He expresses no homicidal and no suicidal ideation.    Review of Systems  HENT: Negative.   Eyes: Negative.   Respiratory: Negative.   Cardiovascular: Negative.   Gastrointestinal: Negative.   Musculoskeletal: Positive for falls.  Skin: Negative.   Neurological: Positive for tremors and weakness.  Psychiatric/Behavioral: Positive for depression. Negative for hallucinations, memory loss, substance abuse and suicidal ideas. The patient is nervous/anxious and has insomnia.     Blood pressure 133/80, pulse 81, temperature 98.4  F (36.9 C), temperature source Oral, resp. rate 18, height 5' 8"  (1.727 m), weight 67.1 kg (147 lb 14.4 oz), SpO2 98 %.Body mass index is 22.49 kg/m.  General Appearance: Casual  Eye Contact:  Fair  Speech:  Slow  Volume:  Decreased  Mood:  Depressed  Affect:  Blunt  Thought Process:  Goal Directed  Orientation:  Full (Time, Place, and Person)  Thought Content:  Tangential  Suicidal Thoughts:  No  Homicidal Thoughts:  No  Memory:  Immediate;   Good Recent;   Fair Remote;   Fair  Judgement:  Fair  Insight:  Fair  Psychomotor Activity:  Decreased and Tremor  Concentration:  Concentration: Fair  Recall:  [employer] of Knowledge:  Fair  Language:  Fair  Akathisia:  No  Handed:  Right  AIMS (if indicated):     Assets:  Communication Skills Desire for Improvement Financial Resources/Insurance Housing Resilience  ADL's:  Impaired  Cognition:  WNL  Sleep:        Treatment Plan Summary: Daily contact with patient to assess and evaluate symptoms and progress in treatment, Medication management and Plan 65 year old man with symptoms consistent with major depression but could also be seen as adjustment disorder. Doesn't have suicidality or psychosis. Does not require inpatient treatment. Treatment options including therapy and medication. Patient is in favor of medication and given his multiple symptoms I think this is a reasonable thing to do. I proposed starting Lexapro 10 mg a day. Medicine described to the patient and he is agreeable. Printed out a prescription and put it on his chart. He should follow-up with both his primary care doctor and his neurologist about trying to find a therapist and get in to get mental health treatment with therapy as soon as possible. Encouragement and psychoeducation completed. We'll follow-up if he continues to be in the hospital.  Disposition: No evidence of imminent risk to self or others at present.   Supportive therapy provided about ongoing  stressors. Discussed crisis plan, support from social network, calling 911, coming to the Emergency Department, and calling Suicide Hotline.  Alethia Berthold, MD 08/15/2016 12:33 PM

## 2016-08-15 NOTE — Progress Notes (Signed)
[place] [facility] [place] NAME: Charles Choi    MR#:  [id number]  DATE OF BIRTH:  1950-12-25  SUBJECTIVE:  CHIEF COMPLAINT:   Chief Complaint  Patient presents with  . Chest Pain   - admitted with chest pain- none now - troponins negative, ECHO today - psych consult for anxiety and depression  REVIEW OF SYSTEMS:  Review of Systems  Constitutional: Negative for chills, fever and malaise/fatigue.  HENT: Negative for ear discharge, ear pain, nosebleeds and tinnitus.   Eyes: Negative for blurred vision and double vision.  Respiratory: Negative for cough, shortness of breath and wheezing.   Cardiovascular: Negative for chest pain, palpitations and leg swelling.  Gastrointestinal: Negative for abdominal pain, constipation, diarrhea, nausea and vomiting.  Genitourinary: Negative for dysuria and urgency.  Musculoskeletal: Positive for back pain and joint pain.  Neurological: Positive for weakness. Negative for dizziness, sensory change, speech change, focal weakness, seizures and headaches.  Psychiatric/Behavioral: Positive for depression. Negative for suicidal ideas. The patient is nervous/anxious.     DRUG ALLERGIES:   Allergies  Allergen Reactions  . Phenergan [Promethazine Hcl] Other (See Comments)    Unknown   . Reglan [Metoclopramide] Other (See Comments)    Pass out     VITALS:  Blood pressure (!) 147/82, pulse 83, temperature 98 F (36.7 C), temperature source Oral, resp. rate 18, height 5\' 8"  (1.727 m), weight 67.1 kg (147 lb 14.4 oz), SpO2 94 %.  PHYSICAL EXAMINATION:  Physical Exam  GENERAL:  65 y.o.-year-old patient lying in the bed with no acute distress.  EYES: Pupils equal, round, reactive to light and accommodation. No scleral icterus. Extraocular muscles intact.  HEENT: Head atraumatic, normocephalic. Oropharynx and nasopharynx clear.  NECK:  Supple, no jugular venous distention. No thyroid enlargement, no tenderness.    LUNGS: Normal breath sounds bilaterally, no wheezing, rales,rhonchi or crepitation. No use of accessory muscles of respiration.  CARDIOVASCULAR: S1, S2 normal. No murmurs, rubs, or gallops.  ABDOMEN: Soft, nontender, nondistended. Bowel sounds present. No organomegaly or mass.  EXTREMITIES: right knee swelling, no erythema- swelling on medial and lateral sides. No pedal edema, cyanosis, or clubbing.  NEUROLOGIC: Cranial nerves II through XII are intact. Muscle strength 5/5 in all extremities. Sensation intact. Gait not checked.  PSYCHIATRIC: The patient is alert and oriented x 3.  SKIN: No obvious rash, lesion, or ulcer.    LABORATORY PANEL:   CBC  Recent Labs Lab 08/15/16 0541  WBC 7.3  HGB 14.4  HCT 42.2  PLT 217   ------------------------------------------------------------------------------------------------------------------  Chemistries   Recent Labs Lab 08/14/16 1718 08/15/16 0541  NA 137 138  K 3.8 3.6  CL 101 107  CO2 28 26  GLUCOSE 106* 101*  BUN 21* 19  CREATININE 1.02 0.94  CALCIUM 9.3 8.2*  MG 2.3  --   AST 23  --   ALT 16*  --   ALKPHOS 114  --   BILITOT 1.5*  --    ------------------------------------------------------------------------------------------------------------------  Cardiac Enzymes  Recent Labs Lab 08/15/16 0541  TROPONINI <0.03   ------------------------------------------------------------------------------------------------------------------  RADIOLOGY:  Dg Chest 2 View  Result Date: 08/14/2016 CLINICAL DATA:  Pt to ED c/o chest tightness for the last hour with shortness of breath; h/o htn, parkinson's disease EXAM: CHEST  2 VIEW COMPARISON:  07/25/2015 FINDINGS: Cardiac silhouette is normal in size. No mediastinal or hilar masses or evidence of adenopathy. Lungs are clear.  No pleural effusion or pneumothorax. There changes from  previous left shoulder surgery and a lumbar spine posterior fusion, stable. IMPRESSION: No acute  cardiopulmonary disease. Electronically Signed   By: Lajean Manes M.D.   On: 08/14/2016 17:52    EKG:   Orders placed or performed during the hospital encounter of 08/14/16  . EKG 12-Lead  . EKG 12-Lead    ASSESSMENT AND PLAN:   65y/o M with PMH of hypertension, parkinsons disease and recent fall and right knee swelling presents to hospital secondary to chest pain  #1 Chest pain- from anxiety/ panic attack - resolved now - troponins negative x 3 - ECHO pending  #2 Depression/anxiety- social stressors in life - not suicidal or homicidal - psych consult for starting meds and outpatient f/u. Will be staying with his mother at discharge  #3 HTN- continue home medications- on losartan  #4 Parkinsons disease- cont home meds- sinemet, amantadine, selegiline  #5 Right knee pain- following with Dr. Roland Rack as outpatient, just saw him last week, had bakers cyst drained and also right knee osteoarthritis with fluid collection drained. Recent steroid injection as well. - outpatient follow up recommended  #6 DVT Prophylaxis- lovenox   Anticipate discharge today if ECHO is normal    All the records are reviewed and case discussed with Care Management/Social Workerr. Management plans discussed with the patient, family and they are in agreement.  CODE STATUS: Full Code  TOTAL TIME TAKING CARE OF THIS PATIENT: 37 minutes.   POSSIBLE D/C TODAY, DEPENDING ON CLINICAL CONDITION.   Trevell Pariseau M.D on 08/15/2016 at 9:09 AM  Between 7am to 6pm - Pager - [phone]  After 6pm go to [email or web address] - password [email or web address]  Office  [phone]  CC: Primary care physician; Idelle Crouch, MD

## 2016-08-15 NOTE — Consult Note (Signed)
Psychiatry: Consult done. Full note to follow.

## 2016-08-15 NOTE — Progress Notes (Signed)
Call placed to central supply and orderly for a knee brace for patient. Per staff they don't have a knee brace at this time.

## 2016-08-15 NOTE — Progress Notes (Signed)
Patient alert and oriented, able to make needs known. Discharge instructions, f/u appts, prescription given and explained to patient, verbalized understanding. Patient was wheeled out of the building. No distress noted.

## 2016-08-15 NOTE — Progress Notes (Signed)
PT Cancellation Note  Patient Details Name: Charles Choi MRN: [id number] DOB: [birth year]   Cancelled Treatment:    Reason Eval/Treat Not Completed: Other (comment).  Pt reporting just starting to eat his lunch and requesting to finish his lunch prior to participating in PT.  Will re-attempt PT eval at a later date/time.   Raquel Sarna Malon Siddall 08/15/2016, 1:34 PM Leitha Bleak, [place]

## 2016-08-15 NOTE — Clinical Social Work Note (Addendum)
MSW received consult that patient's wife is a Ship broker, MSW can not assist with this.  MSW to sign off please reconsult if other social work needs arise.  3:00pm  MSW was informed that patient has a 64 year old grandson and a daughter living with his wife.  MSW spoke to patient to find out more details about if he was concerned about the safety of the grandson.  Patient stated he feels his grandson is well taken care of and safe, MSW informed patient that if he does get concerned he can call Adult protective services about his wife and and Child Protective Services about his grandson, MSW gave him the phone number.  Patient did not express any other concerns or issues, MSW to sign off.  Jones Broom. Edna Rede, MSW [phone]  Mon-Fri 8a-4:30p 08/15/2016 10:01 AM

## 2016-10-13 IMAGING — CR DG CHEST 2V
2 series · 2 of 2 positions shown · non-contrast
Comparison: 09/25/2005

CLINICAL DATA: Shortness of breath left arm pain left chest pain
since this morning

EXAM:
CHEST  2 VIEW

[chest pa]
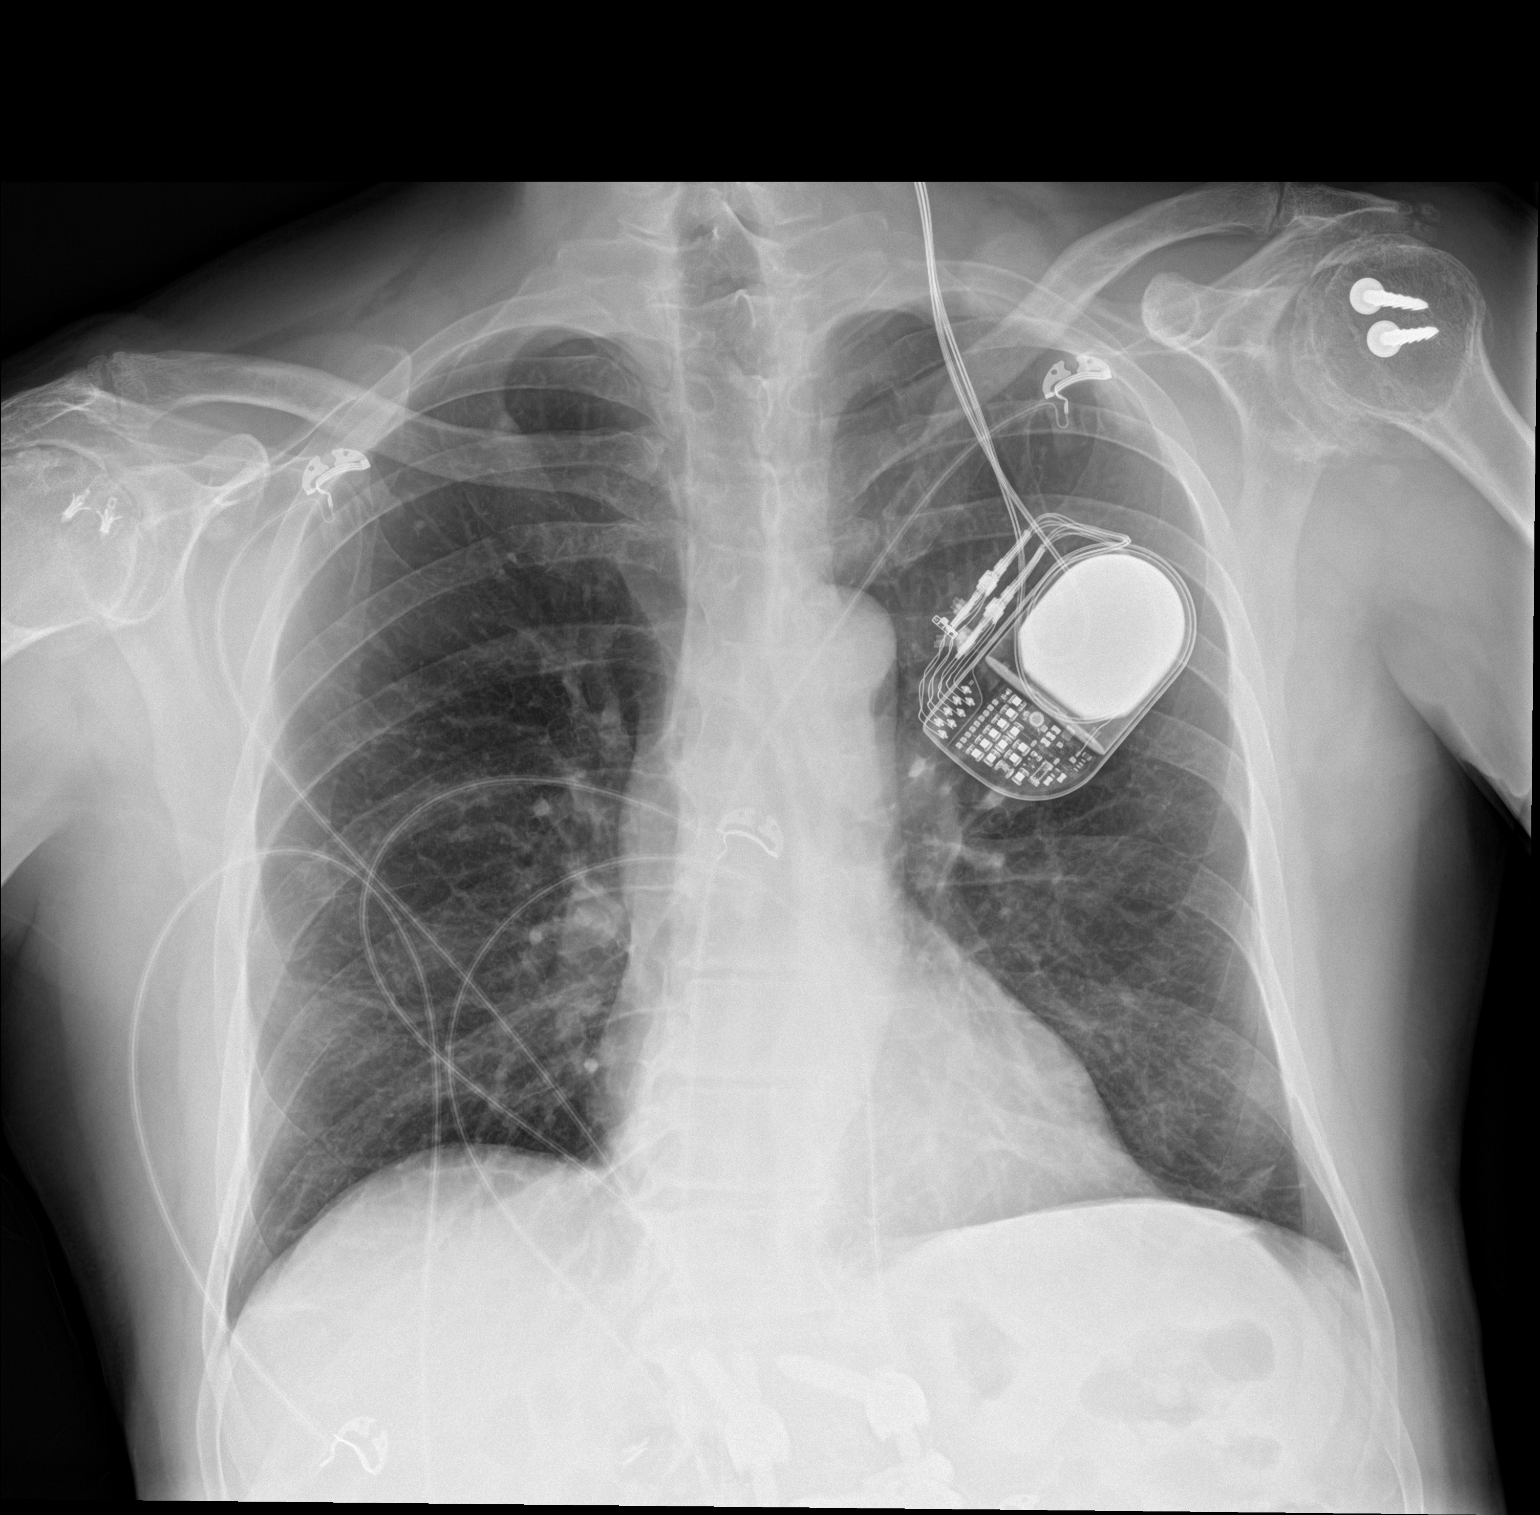

[chest lat]
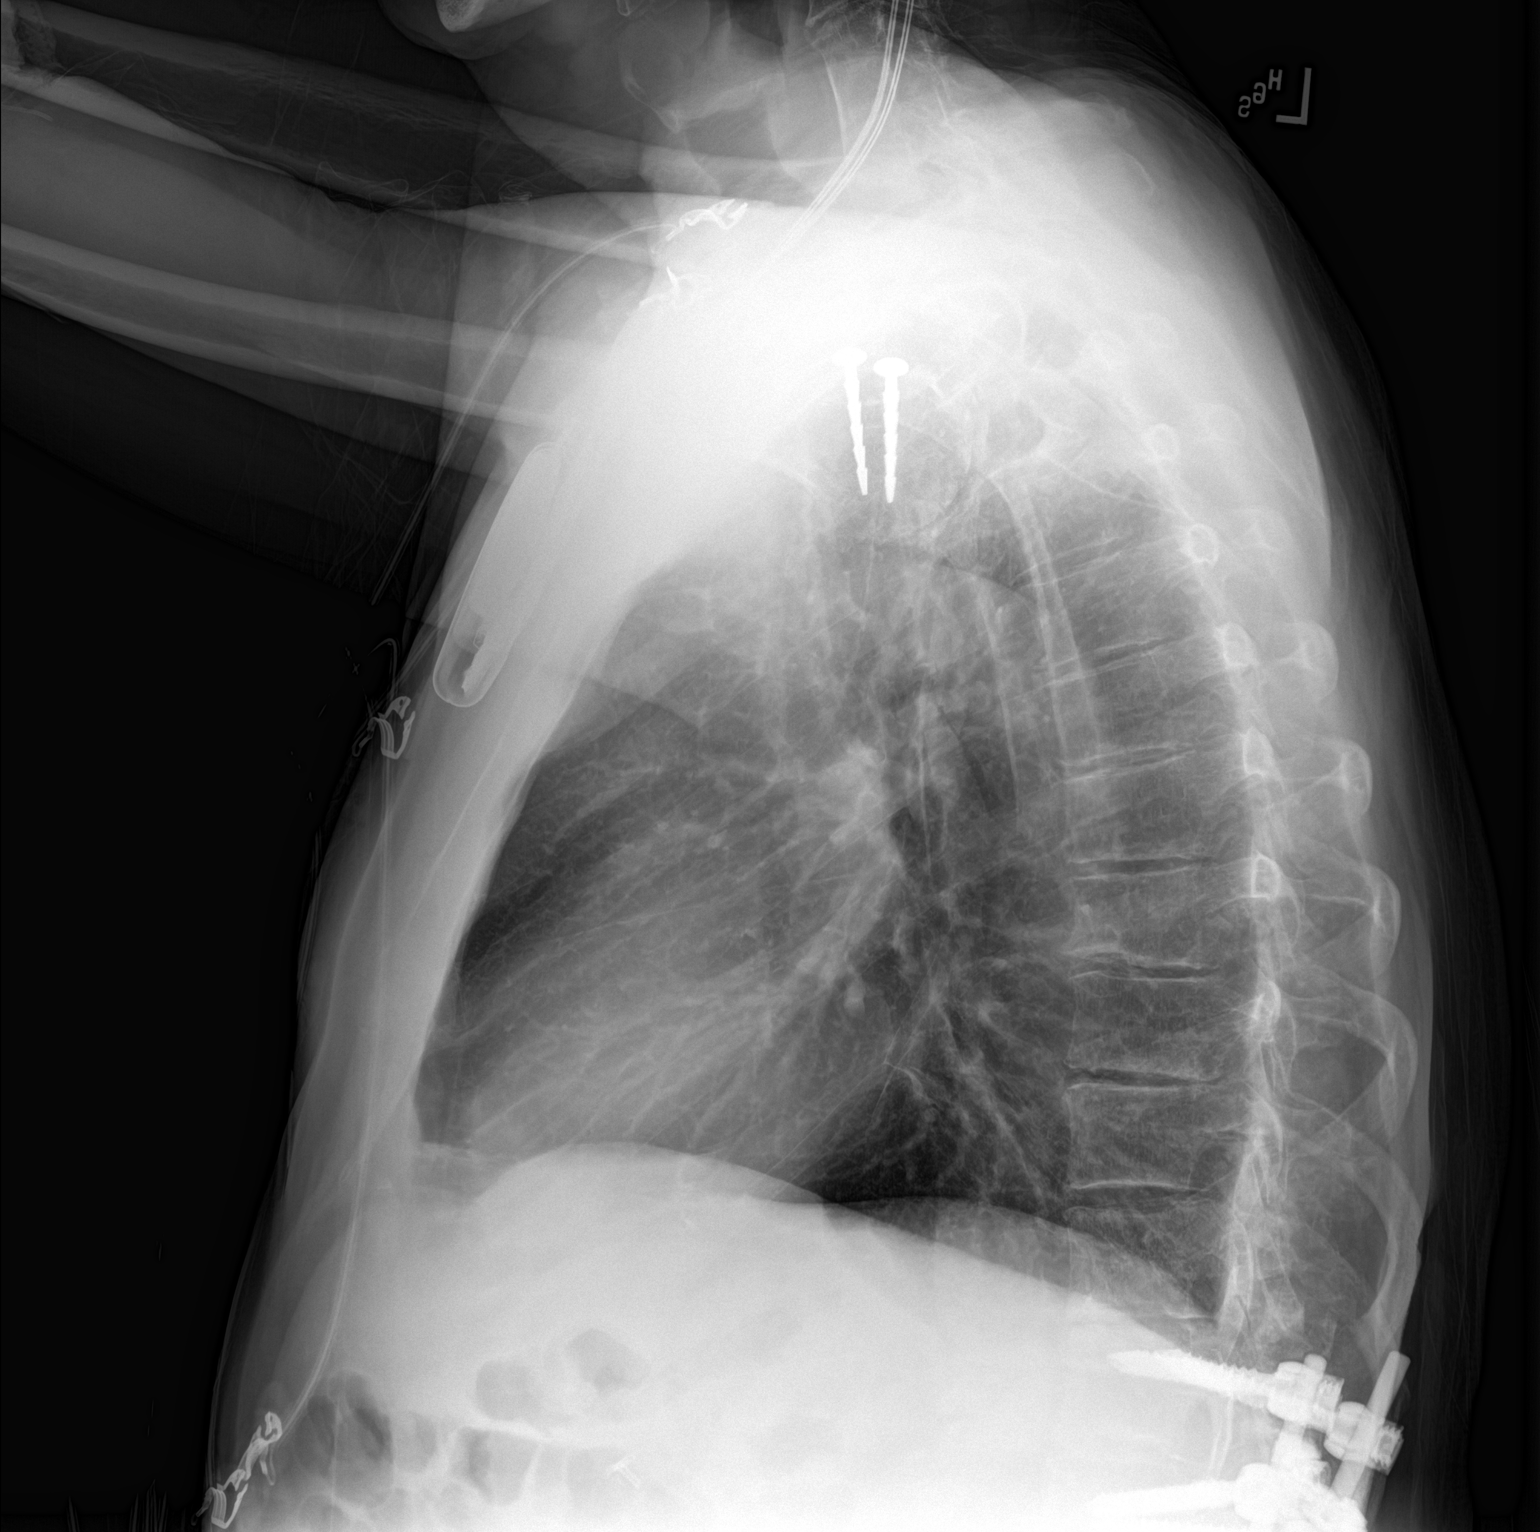

[2 of 2 positions shown; findings below may reference images not displayed]

FINDINGS: Electronic device over left thorax with leads extending into the
neck, stable. Heart size and vascular pattern are normal. Stable
tiny right upper lobe nodules, benign.

There is a 1 cm nodular opacity in the lateral left lung base that
is not seen on the prior study.
IMPRESSION: No evidence of infiltrate or edema.

Consider CT thorax for possible left lower lobe pulmonary nodule.

## 2016-12-30 DIAGNOSIS — Z96652 Presence of left artificial knee joint: Secondary | ICD-10-CM | POA: Insufficient documentation

## 2016-12-30 DIAGNOSIS — M1711 Unilateral primary osteoarthritis, right knee: Secondary | ICD-10-CM | POA: Insufficient documentation

## 2017-01-16 NOTE — Addendum Note (Signed)
Encounter addended by: Ysidra Sopher S Chantella Creech, RT on: 01/16/2017 11:26 AM<BR>    Actions taken: Imaging Exam ended, Charge Capture section accepted

## 2017-03-20 DIAGNOSIS — K219 Gastro-esophageal reflux disease without esophagitis: Secondary | ICD-10-CM | POA: Insufficient documentation

## 2017-04-07 ENCOUNTER — Encounter
Admission: RE | Admit: 2017-04-07 | Discharge: 2017-04-07 | Disposition: A | Discharge status: Home / Self Care | Payer: 59 | Source: Ambulatory Visit | Attending: Internal Medicine | Admitting: Internal Medicine

## 2017-04-07 DIAGNOSIS — I10 Essential (primary) hypertension: Secondary | ICD-10-CM | POA: Insufficient documentation

## 2017-04-07 DIAGNOSIS — E7800 Pure hypercholesterolemia, unspecified: Secondary | ICD-10-CM | POA: Insufficient documentation

## 2017-04-07 DIAGNOSIS — D649 Anemia, unspecified: Secondary | ICD-10-CM | POA: Insufficient documentation

## 2017-04-12 ENCOUNTER — Ambulatory Visit
Admission: RE | Admit: 2017-04-12 | Discharge: 2017-04-12 | Disposition: A | Discharge status: Home / Self Care | Payer: 59 | Source: Ambulatory Visit | Attending: Internal Medicine | Admitting: Internal Medicine

## 2017-04-12 DIAGNOSIS — D649 Anemia, unspecified: Secondary | ICD-10-CM | POA: Diagnosis not present

## 2017-04-12 DIAGNOSIS — I10 Essential (primary) hypertension: Secondary | ICD-10-CM | POA: Diagnosis present

## 2017-04-12 DIAGNOSIS — E7800 Pure hypercholesterolemia, unspecified: Secondary | ICD-10-CM | POA: Diagnosis not present

## 2017-04-12 LAB — COMPREHENSIVE METABOLIC PANEL
ALK PHOS: 99 U/L (ref 38–126)
ALT: 29 U/L (ref 17–63)
AST: 43 U/L — ABNORMAL HIGH (ref 15–41)
Albumin: 4 g/dL (ref 3.5–5.0)
Anion gap: 11 (ref 5–15)
BUN: 14 mg/dL (ref 6–20)
CALCIUM: 9.3 mg/dL (ref 8.9–10.3)
CHLORIDE: 102 mmol/L (ref 101–111)
CO2: 26 mmol/L (ref 22–32)
Creatinine, Ser: 0.92 mg/dL (ref 0.61–1.24)
GFR calc Af Amer: >60 mL/min (ref >60)
GFR calc non Af Amer: >60 mL/min (ref >60)
GLUCOSE: 117 mg/dL — AB (ref 65–99)
Potassium: 3.9 mmol/L (ref 3.5–5.1)
SODIUM: 139 mmol/L (ref 135–145)
Total Bilirubin: 1.1 mg/dL (ref 0.3–1.2)
Total Protein: 8 g/dL (ref 6.5–8.1)

## 2017-04-12 LAB — CBC WITH DIFFERENTIAL/PLATELET
BASOS ABS: 0.1 10*3/uL (ref 0–0.1)
Basophils Relative: 1 %
EOS ABS: 0.4 10*3/uL (ref 0–0.7)
Eosinophils Relative: 4 %
HCT: 43.2 % (ref 40.0–52.0)
HEMOGLOBIN: 14.1 g/dL (ref 13.0–18.0)
LYMPHS PCT: 18 %
Lymphs Abs: 1.9 10*3/uL (ref 1.0–3.6)
MCH: 27.8 pg (ref 26.0–34.0)
MCHC: 32.7 g/dL (ref 32.0–36.0)
MCV: 85 fL (ref 80.0–100.0)
Monocytes Absolute: 1.2 10*3/uL — ABNORMAL HIGH (ref 0.2–1.0)
Monocytes Relative: 11 %
NEUTROS PCT: 66 %
Neutro Abs: 7.2 10*3/uL — ABNORMAL HIGH (ref 1.4–6.5)
Platelets: 397 10*3/uL (ref 150–440)
RBC: 5.08 MIL/uL (ref 4.40–5.90)
RDW: 15.2 % — ABNORMAL HIGH (ref 11.5–14.5)
WBC: 10.7 10*3/uL — ABNORMAL HIGH (ref 3.8–10.6)

## 2017-04-12 LAB — LIPID PANEL
Cholesterol: 172 mg/dL (ref 0–200)
HDL: 40 mg/dL — ABNORMAL LOW (ref >40)
LDL CALC: 94 mg/dL (ref 0–99)
Total CHOL/HDL Ratio: 4.3 RATIO
Triglycerides: 192 mg/dL — ABNORMAL HIGH (ref <150)
VLDL: 38 mg/dL (ref 0–40)

## 2017-04-12 LAB — TSH: TSH: 4.53 u[IU]/mL — ABNORMAL HIGH (ref 0.350–4.500)

## 2017-04-14 ENCOUNTER — Non-Acute Institutional Stay (SKILLED_NURSING_FACILITY): Payer: 59 | Admitting: Gerontology

## 2017-04-14 ENCOUNTER — Encounter: Payer: Self-pay | Admitting: Gerontology

## 2017-04-14 DIAGNOSIS — M25461 Effusion, right knee: Secondary | ICD-10-CM | POA: Diagnosis not present

## 2017-04-14 DIAGNOSIS — Z96651 Presence of right artificial knee joint: Secondary | ICD-10-CM

## 2017-04-17 ENCOUNTER — Ambulatory Visit: Payer: 59 | Attending: Orthopedic Surgery

## 2017-04-17 VITALS — BP 127/75 | HR 102

## 2017-04-17 DIAGNOSIS — G8929 Other chronic pain: Secondary | ICD-10-CM | POA: Insufficient documentation

## 2017-04-17 DIAGNOSIS — M25561 Pain in right knee: Secondary | ICD-10-CM | POA: Diagnosis present

## 2017-04-17 DIAGNOSIS — M6281 Muscle weakness (generalized): Secondary | ICD-10-CM

## 2017-04-17 DIAGNOSIS — R262 Difficulty in walking, not elsewhere classified: Secondary | ICD-10-CM

## 2017-04-17 NOTE — Patient Instructions (Signed)
   Lying on your back:   Prop your ankle on a towel roll for 2 minutes   Take a break.    Perform throughout the day 2 minutes at a time as tolerated.

## 2017-04-17 NOTE — Therapy (Signed)
[facility] [address], [place], [address] Phone: [phone]   Fax:  [phone]  Physical Therapy Evaluation  Patient Details  Name: Charles Choi MRN: [id number] Date of Birth: 1951/05/10 Referring Provider: Frazier Richards, MD  Encounter Date: 04/17/2017      PT End of Session - 04/17/17 1649    Visit Number 1   Number of Visits 29   Date for PT Re-Evaluation 07/27/17   Authorization Type 1   Authorization Time Period of 10 g-code   PT Start Time 1649   PT Stop Time 1753   PT Time Calculation (min) 64 min   Activity Tolerance Patient tolerated treatment well   Behavior During Therapy [facility] for tasks assessed/performed      Past Medical History:  Diagnosis Date  . Anxiety   . Cancer ([place])   . Depression   . Hypertension   . Parkinson's disease [facility])     Past Surgical History:  Procedure Laterality Date  . BACK SURGERY    . CHOLECYSTECTOMY    . DEEP BRAIN STIMULATOR PLACEMENT     for Parkinson's disease  . JOINT REPLACEMENT     left knee x2    Vitals:   04/17/17 1653  BP: 127/75  Pulse: (!) 102         Subjective Assessment - 04/17/17 1659    Subjective R knee pain. 0/10 currently (pt sitting and resting), 4/10 R knee pain at worst for the past 1.5 weeks.    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  Had PT at [facility] for 7 days.  Used the bike 6-10 minutes, as well as worked on walking, ROM for his R knee.  Started walking with a rw and is currently using a rollator walker.  Pt also adds that he was in a wc since October 2017 until the surgery due to pain.    Patient Stated Goals I want to walk without a walker, improve endurance.   Currently in Pain? No/denies   Pain Score 0-No pain   Pain Location Knee   Pain Orientation Right   Pain Descriptors / Indicators Sore   Pain Type Surgical pain;Chronic pain   Pain Onset 1  to 4 weeks ago   Pain Frequency Occasional   Aggravating Factors  Going to the bathroom, stepping over a tub, standing up            [facility] PT Assessment - 04/17/17 1709      Assessment   Medical Diagnosis R TKA   Referring Provider Frazier Richards, MD   Onset Date/Surgical Date 04/04/17   Next MD Visit 04/19/2017, then 2 weeks from then.    Prior Therapy Pt had PT at [facility] which included ROM, walking, and use of a bike     Precautions   Precaution Comments no known precautions     Restrictions   Other Position/Activity Restrictions no known weight bearing restrictions     Balance Screen   Has the patient fallen in the past 6 months No  Pt states using a wc since October 2017   Has the patient had a decrease in activity level because of a fear of falling?  No  pt states fear of falling   Is the patient reluctant to leave their home because of a fear of falling?  No  pt states fear of falling  Home Environment   Additional Comments Pt lives in a 2 story home with family. 4 steps to enter with R rail.  0 steps inside per pt.      Prior Function   Vocation Retired   [employer] PLOF: pt used a wc since October 2017 due to R knee pain, difficulty walking, standing   Leisure play with grandson     Observation/Other Assessments   Observations Surgical incision covered by dressing. Pt wearing TED stockings bilateral LE. Bilateral femoral adduction and IR with sit <> stand transfers.    Lower Extremity Functional Scale  23/80     Posture/Postural Control   Posture Comments decreased bilateral knee extension L > R, L leg in ER, forward flexed posture.  Bilateral genu valgus R > L     AROM   Overall AROM Comments supine R knee extension (quad set): -15 degrees   Right Knee Extension -15  sitting   Right Knee Flexion 120  sitting   Left Knee Extension -24  sitting   Left Knee Flexion 120  sitting     Strength   Overall Strength Comments R knee flexion  and extension strength not yet tested to allow for more healing. Pt only 2 weeks post op.    Left Knee Flexion 4/5   Left Knee Extension 4+/5     Palpation   Patella mobility --   Palpation comment Tightness distal hamstrings medial > lateral. Warmth palpated at knee but similar warmth to L knee. Per pt, the nurse at [facility] checked out his knee and felt better after looking at his incision. No abnormal warmth palpated.      Ambulation/Gait   Gait Comments antalgic, decreased stance R LE, decreased bilateral knee extension during stance phase. Pt ambulates with rollator walker.             Objective measurements completed on examination: See above findings.      Objectives   Manual therapy   Supine STM to distal hamstrings     There-ex   Supine quad set 10x5 seconds   Supine R knee extension stretch with a towel roll behind ankle 2 min  Reviewed and given as part of his HEP. Pt demonstrated and verbalized understanding. Handout provided.    Sit <> stand from elevated mat table with cues for locking the breaks for his rollator walker, proper hand placement, and keeping the rollator in front of him instead of to the side for safety.    Improved exercise technique, movement at target joints, use of target muscles after min to mod verbal, visual, tactile cues.     Pt states being wiped out after session.    Seated R knee extension AROM -14 degrees after session.         PT Education - 04/17/17 1852    Education provided Yes   Education Details ther-ex, HEP, plan of care   Person(s) Educated Patient   Methods Explanation;Demonstration;Tactile cues;Verbal cues;Handout   Comprehension Verbalized understanding;Returned demonstration             PT Long Term Goals - 04/17/17 1814      PT LONG TERM GOAL #1   Title Patient will improve his seated R knee extension AROM to -8 degrees or less to promote ability to ambulate, perform standing tasks.     Baseline -15 degrees seated R knee extension AROM (04/17/2017)   Time 14   Period Weeks   Status New     PT  LONG TERM GOAL #2   Title Patient will be able to ambulate at least 300 ft without AD and no LOB to promote mobility.    Baseline Pt currently ambulates with a rollator walker (04/17/2017)   Time 14   Period Weeks   Status New     PT LONG TERM GOAL #3   Title Patient will improve his LEFS score by at least 15 points as a demonstration of improved function.    Baseline 23/80 (04/17/2017)   Time 14   Period Weeks   Status New     PT LONG TERM GOAL #4   Title Patient will have at least 4+/5 R knee flexion and extension strength to promote ability to perform functional tasks.    Baseline Strength not yet assessed secondary to pt being only 2 weeks post op to allow for more healing (04/17/2017)   Time 14   Period Weeks   Status New     PT LONG TERM GOAL #5   Title Pt will be able to perform sit <> stand transfer from a regular chair independently without UE assist to promote function.    Baseline bilateral UE assist with sit <> stand transfer (04/17/2017)   Time 14   Period Weeks   Status New                Plan - 04/17/17 1805    Clinical Impression Statement Patient is a 66 year old male who came to physical therapy S/P L TKA on 04/04/2017. He also presents with limited knee extension ROM, hamstring tightness, weakness, altered gait pattern and posture, decreased bilateral femoral control, and difficulty performing functional tasks such as walking, and transfers. Patient will benefit from skilled physical therapy services to address the aforementioned deficits.    History and Personal Factors relevant to plan of care: Chronicity of condition, Parkinson's, pt has been in a wc since October 2017 due to R knee pain.    Clinical Presentation Stable   Clinical Presentation due to: good R knee flexion ROM, pt able to ambulate with use of rollator walker   Clinical Decision  Making Low   Rehab Potential Good   Clinical Impairments Affecting Rehab Potential chronicity of condition, Parkinson's disease   PT Frequency 2x / week   PT Duration Other (comment)  14 weeks   PT Treatment/Interventions Neuromuscular re-education;Manual techniques;Therapeutic exercise;Therapeutic activities;Aquatic Therapy;Gait training;Patient/family education   PT Next Visit Plan Knee extension ROM, gait, functional strengthening   Consulted and Agree with Plan of Care Patient      Patient will benefit from skilled therapeutic intervention in order to improve the following deficits and impairments:  Pain, Postural dysfunction, Improper body mechanics, Difficulty walking, Decreased strength, Decreased range of motion  Visit Diagnosis: Chronic pain of right knee - Plan: PT plan of care cert/re-cert  Muscle weakness (generalized) - Plan: PT plan of care cert/re-cert  Difficulty in walking, not elsewhere classified - Plan: PT plan of care cert/re-cert      G-Codes - 37/85/88 1832    Functional Assessment Tool Used (Outpatient Only) LEFS, clinical presentation, patient interview   Functional Limitation Mobility: Walking and moving around   Mobility: Walking and Moving Around Current Status (F0277) At least 60 percent but less than 80 percent impaired, limited or restricted   Mobility: Walking and Moving Around Goal Status (A1287) At least 20 percent but less than 40 percent impaired, limited or restricted       Problem List Patient Active Problem  List   Diagnosis Date Noted  . Moderate recurrent major depression ([place]) 08/15/2016  . Chest pain due to myocardial ischemia 08/14/2016  . Chest pain, rule out acute myocardial infarction 08/14/2016    Joneen Boers PT, DPT  04/17/2017, 7:04 PM  [facility] [address] [place], [address] Phone: [phone]   Fax:  [phone]  Name: REBEL WILLCUTT MRN:  [id number] Date of Birth: 06/26/1951

## 2017-04-18 ENCOUNTER — Ambulatory Visit: Payer: 59

## 2017-04-18 DIAGNOSIS — M6281 Muscle weakness (generalized): Secondary | ICD-10-CM

## 2017-04-18 DIAGNOSIS — M25561 Pain in right knee: Secondary | ICD-10-CM | POA: Diagnosis not present

## 2017-04-18 DIAGNOSIS — R262 Difficulty in walking, not elsewhere classified: Secondary | ICD-10-CM

## 2017-04-18 DIAGNOSIS — G8929 Other chronic pain: Secondary | ICD-10-CM

## 2017-04-18 NOTE — Therapy (Signed)
[facility] [address] [place], [address] Phone: [phone]   Fax:  [phone]  Physical Therapy Treatment  Patient Details  Name: Charles Choi MRN: [id number] Date of Birth: 09/25/1951 Referring Provider: Frazier Richards, MD  Encounter Date: 04/18/2017      PT End of Session - 04/18/17 1746    Visit Number 2   Number of Visits 29   Date for PT Re-Evaluation 07/27/17   Authorization Type 2   Authorization Time Period of 10 g-code   PT Start Time 1748   PT Stop Time 1837   PT Time Calculation (min) 49 min   Activity Tolerance Patient tolerated treatment well   Behavior During Therapy [facility] for tasks assessed/performed      Past Medical History:  Diagnosis Date  . Anxiety   . Cancer ([place])   . Depression   . Hypertension   . Parkinson's disease [facility])     Past Surgical History:  Procedure Laterality Date  . BACK SURGERY    . CHOLECYSTECTOMY    . DEEP BRAIN STIMULATOR PLACEMENT     for Parkinson's disease  . JOINT REPLACEMENT     left knee x2    There were no vitals filed for this visit.      Subjective Assessment - 04/18/17 1749    Subjective Pt states having a hard time doing stuff at home. Has been in a wheel chair since October 2017.  Bandage was dirty so he took it off.  R knee feels fine. 2/10 R knee pain currently.     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  Had PT at [facility] for 7 days.  Used the bike 6-10 minutes, as well as worked on walking, ROM for his R knee.  Started walking with a rw and is currently using a rollator walker.  Pt also adds that he was in a wc since October 2017 until the surgery due to pain.    Patient Stated Goals I want to walk without a walker, improve endurance.   Currently in Pain? Yes   Pain Score 2    Pain Onset 1 to 4 weeks ago            [facility] PT Assessment - 04/17/17 1709       Assessment   Medical Diagnosis R TKA   Referring Provider Frazier Richards, MD   Onset Date/Surgical Date 04/04/17   Next MD Visit 04/19/2017, then 2 weeks from then.    Prior Therapy Pt had PT at [facility] which included ROM, walking, and use of a bike     Precautions   Precaution Comments no known precautions     Restrictions   Other Position/Activity Restrictions no known weight bearing restrictions     Balance Screen   Has the patient fallen in the past 6 months No  Pt states using a wc since October 2017   Has the patient had a decrease in activity level because of a fear of falling?  No  pt states fear of falling   Is the patient reluctant to leave their home because of a fear of falling?  No  pt states fear of falling     Home Environment   Additional Comments Pt lives in a 2 story home with family. 4 steps to enter with R rail.  0 steps inside per pt.  Prior Function   Vocation Retired   [employer] PLOF: pt used a wc since October 2017 due to R knee pain, difficulty walking, standing   Leisure play with grandson     Observation/Other Assessments   Observations Surgical incision covered by dressing. Pt wearing TED stockings bilateral LE. Bilateral femoral adduction and IR with sit <> stand transfers.    Lower Extremity Functional Scale  23/80     Posture/Postural Control   Posture Comments decreased bilateral knee extension L > R, L leg in ER, forward flexed posture.  Bilateral genu valgus R > L     AROM   Overall AROM Comments supine R knee extension (quad set): -15 degrees   Right Knee Extension -15  sitting   Right Knee Flexion 120  sitting   Left Knee Extension -24  sitting   Left Knee Flexion 120  sitting     Strength   Overall Strength Comments R knee flexion and extension strength not yet tested to allow for more healing. Pt only 2 weeks post op.    Left Knee Flexion 4/5   Left Knee Extension 4+/5     Palpation   Patella mobility --    Palpation comment Tightness distal hamstrings medial > lateral. Warmth palpated at knee but similar warmth to L knee. Per pt, the nurse at [facility] checked out his knee and felt better after looking at his incision. No abnormal warmth palpated.      Ambulation/Gait   Gait Comments antalgic, decreased stance R LE, decreased bilateral knee extension during stance phase. Pt ambulates with rollator walker.                              PT Education - 04/18/17 1752    Education provided Yes   Education Details ther-ex   [employer]) Educated Patient   Methods Explanation;Demonstration;Tactile cues;Verbal cues   Comprehension Verbalized understanding;Returned demonstration        Objectives  S/P 2 weeks  Surgical incision healing satisfactorily.   Seated R knee flexion 117 degrees AROM    Manual therapy   Supine STM to distal hamstrings     There-ex   Supine quad set 10x5 seconds for 2 sets with towel roll behind ankle after STM to hamstrings  -13 degrees knee extension ROM in quad set position   Adjusted rollator walker to proper height  Standing TKE R LE with bilateral UE assist 10x2 with 5 second holds to promote knee extension  -14 degrees seated R knee extension in sitting  Seated R knee flexion PROM 10x3  Then AAROM 10x3   125 degrees R knee flexion AROM afterwards  Gait with rollator walker 100 ft x 2 with emphasis on knee flexion during swing phase and knee extension during stance phase to maintain ROM gains and ankle DF during heel strike phase of gait.   Sit <> stand from elevated mat table height 24 inches with bilateral UE assist, emphasis on femoral control 5x    Improved exercise technique, movement at target joints, use of target muscles after mod verbal, visual, tactile cues.    Improved supine R knee extension ROM to -13 degrees in quad set position, and seated R knee flexion AROM to 125 degrees. Pt demonstrates weakness with  bilateral femoral control when standing up from the seated position therefore worked on sit <> stand from an elevated surface to address. Worked on R knee flexion during swing  phase, knee extension during stance phase, and ankle DF during heel strike to promote more normal gait pattern.                    PT Long Term Goals - 04-28-17 1814      PT LONG TERM GOAL #1   Title Patient will improve his seated R knee extension AROM to -8 degrees or less to promote ability to ambulate, perform standing tasks.    Baseline -15 degrees seated R knee extension AROM (04-28-2017)   Time 14   Period Weeks   Status New     PT LONG TERM GOAL #2   Title Patient will be able to ambulate at least 300 ft without AD and no LOB to promote mobility.    Baseline Pt currently ambulates with a rollator walker (04-28-2017)   Time 14   Period Weeks   Status New     PT LONG TERM GOAL #3   Title Patient will improve his LEFS score by at least 15 points as a demonstration of improved function.    Baseline 23/80 (Apr 28, 2017)   Time 14   Period Weeks   Status New     PT LONG TERM GOAL #4   Title Patient will have at least 4+/5 R knee flexion and extension strength to promote ability to perform functional tasks.    Baseline Strength not yet assessed secondary to pt being only 2 weeks post op to allow for more healing (04/28/17)   Time 14   Period Weeks   Status New     PT LONG TERM GOAL #5   Title Pt will be able to perform sit <> stand transfer from a regular chair independently without UE assist to promote function.    Baseline bilateral UE assist with sit <> stand transfer (04/28/2017)   Time 14   Period Weeks   Status New               Plan - 04/18/17 1745    Clinical Impression Statement Improved supine R knee extension ROM to -13 degrees in quad set position, and seated R knee flexion AROM to 125 degrees. Pt demonstrates weakness with bilateral femoral control when standing up from the  seated position therefore worked on sit <> stand from an elevated surface to address. Worked on R knee flexion during swing phase, knee extension during stance phase, and ankle DF during heel strike to promote more normal gait pattern.    Clinical Presentation Stable   Clinical Decision Making Low   Rehab Potential Good   Clinical Impairments Affecting Rehab Potential chronicity of condition, Parkinson's disease   PT Frequency 2x / week   PT Duration Other (comment)  14 weeks   PT Treatment/Interventions Neuromuscular re-education;Manual techniques;Therapeutic exercise;Therapeutic activities;Aquatic Therapy;Gait training;Patient/family education   PT Next Visit Plan Knee extension ROM, gait, functional strengthening   Consulted and Agree with Plan of Care Patient      Patient will benefit from skilled therapeutic intervention in order to improve the following deficits and impairments:  Pain, Postural dysfunction, Improper body mechanics, Difficulty walking, Decreased strength, Decreased range of motion  Visit Diagnosis: Chronic pain of right knee  Muscle weakness (generalized)  Difficulty in walking, not elsewhere classified       G-Codes - Apr 28, 2017 1832    Functional Assessment Tool Used (Outpatient Only) LEFS, clinical presentation, patient interview   Functional Limitation Mobility: Walking and moving around   Mobility: Walking and Moving Around  Current Status [phone]) At least 60 percent but less than 80 percent impaired, limited or restricted   Mobility: Walking and Moving Around Goal Status [phone]) At least 20 percent but less than 40 percent impaired, limited or restricted      Problem List Patient Active Problem List   Diagnosis Date Noted  . Moderate recurrent major depression ([place]) 08/15/2016  . Chest pain due to myocardial ischemia 08/14/2016  . Chest pain, rule out acute myocardial infarction 08/14/2016    Joneen Boers PT, DPT   04/18/2017, 6:57 PM  [facility] [address] [place], [address] Phone: [phone]   Fax:  [phone]  Name: Charles Choi MRN: [id number] Date of Birth: 1951/04/03

## 2017-04-19 ENCOUNTER — Ambulatory Visit: Payer: 59

## 2017-04-20 NOTE — Progress Notes (Signed)
This encounter was created in error - please disregard.

## 2017-04-21 NOTE — Progress Notes (Signed)
Location:      Place of Service:  SNF (31)  Provider: Toni Arthurs, NP-C  PCP: Idelle Crouch, MD Patient Care Team: Idelle Crouch, MD as PCP - General (Internal Medicine)  Extended Emergency Contact Information Primary Emergency Contact: Uselman,Carol A Address: [address]          [place], [place] [address] Johnnette Litter of [place] Phone: [phone] Work Phone: [phone] Relation: Spouse Secondary Emergency Contact: Shands,Dennis  [place] Phone: [phone] Relation: Brother  Code Status: FULL Goals of care:  Advanced Directive information Advanced Directives 04/17/2017  Does Patient Have a Medical Advance Directive? Yes  Type of Advance Directive Living will;Healthcare Power of Attorney  Does patient want to make changes to medical advance directive? No - Patient declined  Would patient like information on creating a medical advance directive? -     Allergies  Allergen Reactions  . Phenergan [Promethazine Hcl] Other (See Comments)    Unknown   . Reglan [Metoclopramide] Other (See Comments)    Pass out     Chief Complaint  Patient presents with  . Acute Visit    HPI:  66 y.o. male seen today for discharge evaluation. Pt was admitted to the facility for rehab following hospitalization for Right Total Knee Replacement. Pt participated in PT and OT. Pt progressed well. Pt reports his pain is minimal. He does report some redness and mild edema to the peri-incisional area. He denies pain to this area. Minimal warmth. Area is mostly to the right lateral area of the knee, consistent with minor seroma/hematoma. Incision is well approximated with steri's in place. Calves soft, supple. Negative Homan's sign. Pt reports his appetite is good. Having regular BMs. Pt reports he feels he is ready for discharge home. VSS. No other complalints.       Past Medical History:  Diagnosis Date  . Anxiety   . Cancer ([place])   . Depression   .  Hypertension   . Parkinson's disease [facility])     Past Surgical History:  Procedure Laterality Date  . BACK SURGERY    . CHOLECYSTECTOMY    . DEEP BRAIN STIMULATOR PLACEMENT     for Parkinson's disease  . JOINT REPLACEMENT     left knee x2      reports that he has never smoked. He has never used smokeless tobacco. He reports that he drinks alcohol. He reports that he uses drugs, including Marijuana. Social History   Social History  . Marital status: Married    Spouse name: N/A  . Number of children: N/A  . Years of education: N/A   Occupational History  . Not on file.   Social History Main Topics  . Smoking status: Never Smoker  . Smokeless tobacco: Never Used  . Alcohol use Yes     Comment: 1 drink per week  . Drug use: Yes    Types: Marijuana  . Sexual activity: Not on file   Other Topics Concern  . Not on file   Social History Narrative  . No narrative on file   Functional Status Survey:    Allergies  Allergen Reactions  . Phenergan [Promethazine Hcl] Other (See Comments)    Unknown   . Reglan [Metoclopramide] Other (See Comments)    Pass out     Pertinent  Health Maintenance Due  Topic Date Due  . COLONOSCOPY  02/21/2002  . PNA vac Low Risk Adult (1 of 2 - PCV13) 02/21/2017  .  INFLUENZA VACCINE  06/07/2017    Medications: Allergies as of 04/14/2017      Reactions   Phenergan [promethazine Hcl] Other (See Comments)   Unknown    Reglan [metoclopramide] Other (See Comments)   Pass out       Medication List       Accurate as of 04/14/17 11:59 PM. Always use your most recent med list.          amantadine 100 MG capsule Commonly known as:  SYMMETREL Take 100 mg by mouth daily.   carbidopa-levodopa 25-100 MG tablet Commonly known as:  SINEMET IR Take 0.5-1 tablets by mouth See admin instructions. Take 1 tablet orally two times a day at 7am, and 10pm. Take 0.5 tablet orally at 10am, 1pm, 4pm, and 7pm.   escitalopram 10 MG tablet Commonly known  as:  LEXAPRO Take 1 tablet (10 mg total) by mouth daily.   etodolac 200 MG capsule Commonly known as:  LODINE Take 200 mg by mouth every 8 (eight) hours.   losartan 50 MG tablet Commonly known as:  COZAAR Take 50 mg by mouth daily.   omeprazole 20 MG tablet Commonly known as:  PRILOSEC OTC Take 20 mg by mouth 2 (two) times daily.   rOPINIRole 2 MG tablet Commonly known as:  REQUIP Take 2 mg by mouth 6 (six) times daily. At 7am, and 1pm.   selegiline 5 MG capsule Commonly known as:  ELDEPRYL Take 5 mg by mouth 2 (two) times daily. At 7am and 1pm.   traMADol 50 MG tablet Commonly known as:  ULTRAM Take 50 mg by mouth every 6 (six) hours as needed.   traZODone 150 MG tablet Commonly known as:  DESYREL Take 150 mg by mouth at bedtime.       Review of Systems  Constitutional: Negative for activity change, appetite change, chills, diaphoresis and fever.  HENT: Negative for congestion, sneezing, sore throat, trouble swallowing and voice change.   Respiratory: Negative for apnea, cough, choking, chest tightness, shortness of breath and wheezing.   Cardiovascular: Negative for chest pain, palpitations and leg swelling.  Gastrointestinal: Negative for abdominal distention, abdominal pain, constipation, diarrhea and nausea.  Genitourinary: Negative for difficulty urinating, dysuria, frequency and urgency.  Musculoskeletal: Positive for arthralgias (typical arthritis). Negative for back pain, gait problem and myalgias.  Skin: Positive for wound. Negative for color change, pallor and rash.  Neurological: Negative for dizziness, tremors, syncope, speech difficulty, weakness, numbness and headaches.  Psychiatric/Behavioral: Negative for agitation and behavioral problems.  All other systems reviewed and are negative.   Vitals:   04/14/17 0550  BP: 131/76  Pulse: 84  Resp: 18  Temp: 98 F (36.7 C)  SpO2: 97%   There is no height or weight on file to calculate BMI. Physical  Exam  Constitutional: He is oriented to person, place, and time. Vital signs are normal. He appears well-developed and well-nourished. He is active and cooperative. He does not appear ill. No distress.  HENT:  Head: Normocephalic and atraumatic.  Mouth/Throat: Uvula is midline, oropharynx is clear and moist and mucous membranes are normal. Mucous membranes are not pale, not dry and not cyanotic.  Eyes: Conjunctivae, EOM and lids are normal. Pupils are equal, round, and reactive to light.  Neck: Trachea normal, normal range of motion and full passive range of motion without pain. Neck supple. No JVD present. No tracheal deviation, no edema and no erythema present. No thyromegaly present.  Cardiovascular: Normal rate, regular rhythm, normal heart  sounds, intact distal pulses and normal pulses.  Exam reveals no gallop, no distant heart sounds and no friction rub.   No murmur heard. Pulses:      Dorsalis pedis pulses are 2+ on the right side, and 2+ on the left side.  Pulmonary/Chest: Effort normal and breath sounds normal. No accessory muscle usage. No respiratory distress. He has no decreased breath sounds. He has no wheezes. He has no rhonchi. He has no rales. He exhibits no tenderness.  Abdominal: Normal appearance and bowel sounds are normal. He exhibits no distension and no ascites. There is no tenderness.  Musculoskeletal: He exhibits no edema or tenderness.       Right knee: He exhibits decreased range of motion, swelling (mild), laceration (incision) and erythema (mild ).  Expected osteoarthritis, stiffness. Calves soft, supple. Negative Homan's sign  Neurological: He is alert and oriented to person, place, and time. He has normal strength.  Skin: Skin is warm and dry. Laceration (RTKR incision) noted. He is not diaphoretic. No cyanosis. No pallor. Nails show no clubbing.  Psychiatric: He has a normal mood and affect. His speech is normal and behavior is normal. Judgment and thought content  normal. Cognition and memory are normal.  Nursing note and vitals reviewed.   Labs reviewed: Basic Metabolic Panel:  Recent Labs  08/14/16 1718 08/15/16 0541 04/12/17 0600  NA 137 138 139  K 3.8 3.6 3.9  CL 101 107 102  CO2 28 26 26   GLUCOSE 106* 101* 117*  BUN 21* 19 14  CREATININE 1.02 0.94 0.92  CALCIUM 9.3 8.2* 9.3  MG 2.3  --   --   PHOS 2.8  --   --    Liver Function Tests:  Recent Labs  08/14/16 1718 04/12/17 0600  AST 23 43*  ALT 16* 29  ALKPHOS 114 99  BILITOT 1.5* 1.1  PROT 6.9 8.0  ALBUMIN 3.8 4.0   No results for input(s): LIPASE, AMYLASE in the last 8760 hours. No results for input(s): AMMONIA in the last 8760 hours. CBC:  Recent Labs  08/14/16 1718 08/15/16 0541 04/12/17 0600  WBC 9.3 7.3 10.7*  NEUTROABS  --   --  7.2*  HGB 15.9 14.4 14.1  HCT 46.9 42.2 43.2  MCV 83.5 84.0 85.0  PLT 251 217 397   Cardiac Enzymes:  Recent Labs  08/14/16 2342 08/15/16 0541 08/15/16 1153  TROPONINI <0.03 <0.03 <0.03   BNP: Invalid input(s): POCBNP CBG: No results for input(s): GLUCAP in the last 8760 hours.  Procedures and Imaging Studies During Stay: No results found.  Assessment/Plan:   1. Swollen R knee  Alternate warm, moist compresses with ice Q 4 hours to the area of redness  Do not apply heat to the incision  2. S/P total knee arthroplasty, right  Continue PT with Outpatient PT  Continue exercises as taught by PT/OT  Continue Tylenol 500 mg po TID prn for pain  Continue dressing changes as instructed  Follow up with Orthopedist asap after discharge for continuity of care   Patient is being discharged with the following home health services: OPPT   Patient is being discharged with the following durable medical equipment:  none  Patient has been advised to f/u with their PCP in 1-2 weeks to bring them up to date on their rehab stay.  Social services at facility was responsible for arranging this appointment.  Pt was provided  with a 30 day supply of prescriptions for medications and refills must be  obtained from their PCP.  For controlled substances, a more limited supply may be provided adequate until PCP appointment only.  Future labs/tests needed:  Per pcp  Family/ staff Communication:   Total Time:  Documentation:  Face to Face:  Family/Phone:  Vikki Ports, NP-C [facility] [address], [place] [address] Cell Phone (Mon-Fri 8am-5pm):  [phone] On Call:  [phone] & follow prompts after 5pm & weekends Office Phone:  [phone] Office Fax:  [phone]

## 2017-04-24 ENCOUNTER — Ambulatory Visit: Payer: 59

## 2017-04-24 DIAGNOSIS — R262 Difficulty in walking, not elsewhere classified: Secondary | ICD-10-CM

## 2017-04-24 DIAGNOSIS — M6281 Muscle weakness (generalized): Secondary | ICD-10-CM

## 2017-04-24 DIAGNOSIS — M25561 Pain in right knee: Secondary | ICD-10-CM | POA: Diagnosis not present

## 2017-04-24 DIAGNOSIS — G8929 Other chronic pain: Secondary | ICD-10-CM

## 2017-04-24 NOTE — Therapy (Signed)
[place] [facility] [address], [place], [address] Phone: [phone]   Fax:  [phone]  Physical Therapy Treatment  Patient Details  Name: Charles Choi MRN: [id number] Date of Birth: 1951/09/11 Referring Provider: Frazier Richards, MD  Encounter Date: 04/24/2017      PT End of Session - 04/24/17 1307    Visit Number 3   Number of Visits 29   Date for PT Re-Evaluation 07/27/17   Authorization Type 3   Authorization Time Period of 10 g-code   PT Start Time 1307   PT Stop Time 1353   PT Time Calculation (min) 46 min   Activity Tolerance Patient tolerated treatment well   Behavior During Therapy [facility] for tasks assessed/performed      Past Medical History:  Diagnosis Date  . Anxiety   . Cancer ([place])   . Depression   . Hypertension   . Parkinson's disease [facility])     Past Surgical History:  Procedure Laterality Date  . BACK SURGERY    . CHOLECYSTECTOMY    . DEEP BRAIN STIMULATOR PLACEMENT     for Parkinson's disease  . JOINT REPLACEMENT     left knee x2    There were no vitals filed for this visit.      Subjective Assessment - 04/24/17 1308    Subjective Pt sister states that the bending is good per PA and was released. Pt states that he wants to get stronger.  Goes back in 4 more weeks to see the surgeon.  The PA was very impressed with the knee bending.  Has a knee stabilizer in his joint. Not really having knee pain, maybe slight soreness.    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  Had PT at [facility] for 7 days.  Used the bike 6-10 minutes, as well as worked on walking, ROM for his R knee.  Started walking with a rw and is currently using a rollator walker.  Pt also adds that he was in a wc since October 2017 until the surgery due to pain.    Patient Stated Goals I want to walk without a walker, improve endurance.   Currently in Pain?  No/denies   Pain Onset 1 to 4 weeks ago                                 PT Education - 04/24/17 1328    Education provided Yes   Education Details ther-ex   [employer]) Educated Patient   Methods Demonstration;Explanation;Tactile cues;Verbal cues   Comprehension Verbalized understanding;Returned demonstration        Objectives  S/P 3 weeks  Surgical incision healing satisfactorily.   -15 degrees knee extension AROM     Manual therapy   Supine STM to distal hamstrings     There-ex   Supine quad set 10x5 seconds for 2 sets with towel roll behind ankle after STM to hamstrings Supine SLR R hip flexion 10x2 Standing R hip abduction 10x2 with bilateral UE assist Standing hip extension 10x2 with bilateral UE assist standing ankle DF/PF 2 min with bilateral UE assist Standing R TKE with bilateral UE assist 10x2 with 5 second holds to promote knee extension Seated R knee extension 10x2 with 5 second holds         sit <> stand from chair with arms  with pillow throughout session with emphasis on femoral control      Improved exercise technique, movement at target joints, use of target muscles after min to mod verbal, visual, tactile cues.    Difficulty performing sit <> stand with difficulty with femoral control due to weakness. Improved control after cues and practice. Continued working on knee extension secondary to limited ROM and LE strengthening due to weakness.         PT Long Term Goals - 04/17/17 1814      PT LONG TERM GOAL #1   Title Patient will improve his seated R knee extension AROM to -8 degrees or less to promote ability to ambulate, perform standing tasks.    Baseline -15 degrees seated R knee extension AROM (04/17/2017)   Time 14   Period Weeks   Status New     PT LONG TERM GOAL #2   Title Patient will be able to ambulate at least 300 ft without AD and no LOB to promote mobility.    Baseline Pt currently  ambulates with a rollator walker (04/17/2017)   Time 14   Period Weeks   Status New     PT LONG TERM GOAL #3   Title Patient will improve his LEFS score by at least 15 points as a demonstration of improved function.    Baseline 23/80 (04/17/2017)   Time 14   Period Weeks   Status New     PT LONG TERM GOAL #4   Title Patient will have at least 4+/5 R knee flexion and extension strength to promote ability to perform functional tasks.    Baseline Strength not yet assessed secondary to pt being only 2 weeks post op to allow for more healing (04/17/2017)   Time 14   Period Weeks   Status New     PT LONG TERM GOAL #5   Title Pt will be able to perform sit <> stand transfer from a regular chair independently without UE assist to promote function.    Baseline bilateral UE assist with sit <> stand transfer (04/17/2017)   Time 14   Period Weeks   Status New               Plan - 04/24/17 1328    Clinical Impression Statement Difficulty performing sit <> stand with difficulty with femoral control due to weakness. Improved control after cues and practice. Continued working on knee extension secondary to limited ROM and LE strengthening due to weakness.    Clinical Presentation Stable   Clinical Decision Making Low   Rehab Potential Good   Clinical Impairments Affecting Rehab Potential chronicity of condition, Parkinson's disease   PT Frequency 2x / week   PT Duration Other (comment)  14 weeks   PT Treatment/Interventions Neuromuscular re-education;Manual techniques;Therapeutic exercise;Therapeutic activities;Aquatic Therapy;Gait training;Patient/family education   PT Next Visit Plan Knee extension ROM, gait, functional strengthening   Consulted and Agree with Plan of Care Patient      Patient will benefit from skilled therapeutic intervention in order to improve the following deficits and impairments:  Pain, Postural dysfunction, Improper body mechanics, Difficulty walking, Decreased  strength, Decreased range of motion  Visit Diagnosis: Chronic pain of right knee  Muscle weakness (generalized)  Difficulty in walking, not elsewhere classified     Problem List Patient Active Problem List   Diagnosis Date Noted  . Moderate recurrent major depression ([place]) 08/15/2016  . Chest pain due to myocardial ischemia 08/14/2016  . Chest pain, rule  out acute myocardial infarction 08/14/2016    Joneen Boers PT, DPT   04/24/2017, 2:06 PM  [place] [facility] [address] [place], [address] Phone: [phone]   Fax:  [phone]  Name: Charles Choi MRN: [id number] Date of Birth: 1951/01/10

## 2017-04-25 ENCOUNTER — Other Ambulatory Visit: Payer: Self-pay | Admitting: Internal Medicine

## 2017-04-25 DIAGNOSIS — M7989 Other specified soft tissue disorders: Secondary | ICD-10-CM

## 2017-04-26 ENCOUNTER — Ambulatory Visit: Payer: 59

## 2017-04-26 DIAGNOSIS — M25561 Pain in right knee: Principal | ICD-10-CM

## 2017-04-26 DIAGNOSIS — G8929 Other chronic pain: Secondary | ICD-10-CM

## 2017-04-26 DIAGNOSIS — R262 Difficulty in walking, not elsewhere classified: Secondary | ICD-10-CM

## 2017-04-26 DIAGNOSIS — M6281 Muscle weakness (generalized): Secondary | ICD-10-CM

## 2017-04-26 NOTE — Therapy (Signed)
[place] [facility] [address] [place], [address] Phone: [phone]   Fax:  [phone]  Physical Therapy Treatment  Patient Details  Name: Charles Choi MRN: [id number] Date of Birth: [birth year] Referring Provider: Frazier Richards, MD  Encounter Date: 04/26/2017      PT End of Session - 04/26/17 1350    Visit Number 4   Number of Visits 29   Date for PT Re-Evaluation 07/27/17   Authorization Type 4   Authorization Time Period of 10 g-code   PT Start Time 1350   PT Stop Time 1415   PT Time Calculation (min) 25 min   Activity Tolerance Patient tolerated treatment well   Behavior During Therapy [facility] for tasks assessed/performed      Past Medical History:  Diagnosis Date  . Anxiety   . Cancer ([place])   . Depression   . Hypertension   . Parkinson's disease [facility])     Past Surgical History:  Procedure Laterality Date  . BACK SURGERY    . CHOLECYSTECTOMY    . DEEP BRAIN STIMULATOR PLACEMENT     for Parkinson's disease  . JOINT REPLACEMENT     left knee x2    There were no vitals filed for this visit.      Subjective Assessment - 04/26/17 1351    Subjective Went to Dr. Fulton Reek, MD (PCP) yesterday. Getting a chest x-ray for his cough and blood work due to sugar. Getting an ultrasound tomorrow for his ankle to rule out blood clot.  Not taking any blood thinners.    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  Had PT at [facility] for 7 days.  Used the bike 6-10 minutes, as well as worked on walking, ROM for his R knee.  Started walking with a rw and is currently using a rollator walker.  Pt also adds that he was in a wc since October 2017 until the surgery due to pain.    Patient Stated Goals I want to walk without a walker, improve endurance.   Currently in Pain? No/denies   Pain Score 0-No pain   Pain Onset 1 to 4 weeks ago                                  PT Education - 04/26/17 1359    Education provided Yes   Education Details ther-ex   [employer]) Educated Patient   Methods Explanation;Demonstration;Tactile cues;Verbal cues   Comprehension Returned demonstration;Verbalized understanding        Objectives  S/P 3 weeks  Surgical incision healing satisfactorily.   -18 degrees seated knee extension AROM at start of session    Pt not wearing his TED stockings secondary to it was discharged by his MD.    There-ex   Seated knee extension AROM  R knee and ankle swelling, no abnormal redness, warmth compared to L LE.  Pt was recommended to go to the ER if he has difficulty breathing. Pt verbalized understanding.   Seated R knee extension 10x2 with 5 second holds Supine SLR R hip flexion 10x2 S/L R hip abduction 5x3 Standing R hip abduction 10x2 with bilateral UE assist Standing R hip extension 10x2 with bilateral UE assist  Improved exercise technique, movement at target joints, use of target muscles after min to mod verbal, visual, tactile  cues.    Gentle hip and quadriceps strengthening performed today to promote ability to ambulate without rollator walker when appropriate. Pt tolerated session well without aggravation of symptoms.         PT Long Term Goals - 04/17/17 1814      PT LONG TERM GOAL #1   Title Patient will improve his seated R knee extension AROM to -8 degrees or less to promote ability to ambulate, perform standing tasks.    Baseline -15 degrees seated R knee extension AROM (04/17/2017)   Time 14   Period Weeks   Status New     PT LONG TERM GOAL #2   Title Patient will be able to ambulate at least 300 ft without AD and no LOB to promote mobility.    Baseline Pt currently ambulates with a rollator walker (04/17/2017)   Time 14   Period Weeks   Status New     PT LONG TERM GOAL #3   Title Patient will improve his LEFS score by  at least 15 points as a demonstration of improved function.    Baseline 23/80 (04/17/2017)   Time 14   Period Weeks   Status New     PT LONG TERM GOAL #4   Title Patient will have at least 4+/5 R knee flexion and extension strength to promote ability to perform functional tasks.    Baseline Strength not yet assessed secondary to pt being only 2 weeks post op to allow for more healing (04/17/2017)   Time 14   Period Weeks   Status New     PT LONG TERM GOAL #5   Title Pt will be able to perform sit <> stand transfer from a regular chair independently without UE assist to promote function.    Baseline bilateral UE assist with sit <> stand transfer (04/17/2017)   Time 14   Period Weeks   Status New               Plan - 04/26/17 1350    Clinical Impression Statement Gentle hip and quadriceps strengthening performed today to promote ability to ambulate without rollator walker when appropriate. Pt tolerated session well without aggravation of symptoms.    Clinical Presentation Stable   Clinical Decision Making Low   Rehab Potential Good   Clinical Impairments Affecting Rehab Potential chronicity of condition, Parkinson's disease   PT Frequency 2x / week   PT Duration Other (comment)  14 weeks   PT Treatment/Interventions Neuromuscular re-education;Manual techniques;Therapeutic exercise;Therapeutic activities;Aquatic Therapy;Gait training;Patient/family education   PT Next Visit Plan Knee extension ROM, gait, functional strengthening   Consulted and Agree with Plan of Care Patient      Patient will benefit from skilled therapeutic intervention in order to improve the following deficits and impairments:  Pain, Postural dysfunction, Improper body mechanics, Difficulty walking, Decreased strength, Decreased range of motion  Visit Diagnosis: Chronic pain of right knee  Muscle weakness (generalized)  Difficulty in walking, not elsewhere classified     Problem List Patient  Active Problem List   Diagnosis Date Noted  . Moderate recurrent major depression ([place]) 08/15/2016  . Chest pain due to myocardial ischemia 08/14/2016  . Chest pain, rule out acute myocardial infarction 08/14/2016    Joneen Boers PT, DPT   04/26/2017, 2:24 PM  [place] [facility] [address], [place], [address] Phone: [phone]   Fax:  [phone]  Name: Charles Choi MRN: [id number] Date  of Birth: 1951/08/16

## 2017-04-27 ENCOUNTER — Ambulatory Visit
Admission: RE | Admit: 2017-04-27 | Discharge: 2017-04-27 | Disposition: A | Discharge status: Home / Self Care | Payer: 59 | Source: Ambulatory Visit | Attending: Internal Medicine | Admitting: Internal Medicine

## 2017-04-27 DIAGNOSIS — M7989 Other specified soft tissue disorders: Secondary | ICD-10-CM | POA: Diagnosis present

## 2017-05-02 ENCOUNTER — Ambulatory Visit: Payer: 59

## 2017-05-02 DIAGNOSIS — M25561 Pain in right knee: Secondary | ICD-10-CM | POA: Diagnosis not present

## 2017-05-02 DIAGNOSIS — M6281 Muscle weakness (generalized): Secondary | ICD-10-CM

## 2017-05-02 DIAGNOSIS — R262 Difficulty in walking, not elsewhere classified: Secondary | ICD-10-CM

## 2017-05-02 DIAGNOSIS — G8929 Other chronic pain: Secondary | ICD-10-CM

## 2017-05-02 NOTE — Therapy (Signed)
[place] [facility] [address], [place], [address] Phone: [phone]   Fax:  [phone]  Physical Therapy Treatment  Patient Details  Name: Charles Choi MRN: [id number] Date of Birth: 1951/01/07 Referring Provider: Frazier Richards, MD  Encounter Date: 05/02/2017      PT End of Session - 05/02/17 1356    Visit Number 5   Number of Visits 29   Date for PT Re-Evaluation 07/27/17   Authorization Type 5   Authorization Time Period of 10 g-code   PT Start Time 8341  pt arrived late   PT Stop Time 1431   PT Time Calculation (min) 35 min   Activity Tolerance Patient tolerated treatment well   Behavior During Therapy [facility] for tasks assessed/performed      Past Medical History:  Diagnosis Date  . Anxiety   . Cancer ([place])   . Depression   . Hypertension   . Parkinson's disease [facility])     Past Surgical History:  Procedure Laterality Date  . BACK SURGERY    . CHOLECYSTECTOMY    . DEEP BRAIN STIMULATOR PLACEMENT     for Parkinson's disease  . JOINT REPLACEMENT     left knee x2    There were no vitals filed for this visit.      Subjective Assessment - 05/02/17 1357    Subjective Pt states having trouble walking. Does not know if his Parkinson's has something to do with it. Feels soreness in both his front thigh muscles.  No R knee pain.  Everything was good for his R leg... no blood clots.  Wore his support stocking the other day which helped with the swelling.  Pt also states walking with his cane on Sunday, holding it with his R hand which turned out ok.  Does not hold it with his L hand due to L shoulder pain.    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  Had PT at [facility] for 7 days.  Used the bike 6-10 minutes, as well as worked on walking, ROM for his R knee.  Started walking with a rw and is currently using a rollator walker.  Pt also adds that  he was in a wc since October 2017 until the surgery due to pain.    Patient Stated Goals I want to walk without a walker, improve endurance.   Currently in Pain? No/denies   Pain Score 0-No pain   Pain Onset 1 to 4 weeks ago                                 PT Education - 05/02/17 1420    Education provided Yes   Education Details ther-ex   [employer]) Educated Patient   Methods Explanation;Demonstration;Tactile cues;Verbal cues   Comprehension Returned demonstration;Verbalized understanding        Objectives  S/P 4weeks   -20  degrees seated knee extension AROM at start of session  Standing posture: R lateral shift  There-ex   Seated knee extension AROM multiple times  Seated R knee extension 10x2 with 5 second holds  Supine quad set with gentle overpressure from PT to promote knee extension ROM 10x5 seconds for 2 sets  SLR R hip flexion 10x2  Standing R hip abduction 10x with bilateral UE assist  Standing R hip extension 10x with bilateral  UE assist  Seated L shoulder adductoin resisting yellow band to help with posture 10x2    Improved exercise technique, movement at target joints, use of target muscles after min to mod verbal, visual, tactile cues.     Manual therapy  Supine STM to R hamstrings to promote knee extension ROM. Leg propped on pillow.    -10 degrees R knee extension in supine quad set position after STM to hamstrings and quad set with gentle over pressure. -20 degrees R knee extensoin AROM in sitting. Difficulty walking and performing sit <> stand today with pt demonstrating decreased bilateral femoral control, and increased L foot/leg ER. No complain of knee pain throughout session.            PT Long Term Goals - 04/17/17 1814      PT LONG TERM GOAL #1   Title Patient will improve his seated R knee extension AROM to -8 degrees or less to promote ability to ambulate, perform standing tasks.     Baseline -15 degrees seated R knee extension AROM (04/17/2017)   Time 14   Period Weeks   Status New     PT LONG TERM GOAL #2   Title Patient will be able to ambulate at least 300 ft without AD and no LOB to promote mobility.    Baseline Pt currently ambulates with a rollator walker (04/17/2017)   Time 14   Period Weeks   Status New     PT LONG TERM GOAL #3   Title Patient will improve his LEFS score by at least 15 points as a demonstration of improved function.    Baseline 23/80 (04/17/2017)   Time 14   Period Weeks   Status New     PT LONG TERM GOAL #4   Title Patient will have at least 4+/5 R knee flexion and extension strength to promote ability to perform functional tasks.    Baseline Strength not yet assessed secondary to pt being only 2 weeks post op to allow for more healing (04/17/2017)   Time 14   Period Weeks   Status New     PT LONG TERM GOAL #5   Title Pt will be able to perform sit <> stand transfer from a regular chair independently without UE assist to promote function.    Baseline bilateral UE assist with sit <> stand transfer (04/17/2017)   Time 14   Period Weeks   Status New               Plan - 05/02/17 1431    Clinical Impression Statement -10 degrees R knee extension in supine quad set position after STM to hamstrings and quad set with gentle over pressure. -20 degrees R knee extensoin AROM in sitting. Difficulty walking and performing sit <> stand today with pt demonstrating decreased bilateral femoral control, and increased L foot/leg ER. No complain of knee pain throughout session.   History and Personal Factors relevant to plan of care: Parkinson's, chronicity of condition   Clinical Presentation Evolving   Clinical Presentation due to: difficulty with gait and sit <> stand today compared to previous session   Clinical Decision Making Moderate   Rehab Potential Good   Clinical Impairments Affecting Rehab Potential chronicity of condition,  Parkinson's disease   PT Frequency 2x / week   PT Duration Other (comment)  14 weeks   PT Treatment/Interventions Neuromuscular re-education;Manual techniques;Therapeutic exercise;Therapeutic activities;Aquatic Therapy;Gait training;Patient/family education   PT Next Visit Plan Knee extension ROM, gait,  functional strengthening   Consulted and Agree with Plan of Care Patient      Patient will benefit from skilled therapeutic intervention in order to improve the following deficits and impairments:  Pain, Postural dysfunction, Improper body mechanics, Difficulty walking, Decreased strength, Decreased range of motion  Visit Diagnosis: Difficulty in walking, not elsewhere classified  Chronic pain of right knee  Muscle weakness (generalized)     Problem List Patient Active Problem List   Diagnosis Date Noted  . Moderate recurrent major depression ([place]) 08/15/2016  . Chest pain due to myocardial ischemia 08/14/2016  . Chest pain, rule out acute myocardial infarction 08/14/2016   Joneen Boers PT, DPT   05/02/2017, 8:29 PM  [place] [facility] [address] [place], [address] Phone: [phone]   Fax:  [phone]  Name: Charles Choi MRN: [id number] Date of Birth: Sep 29, 1951

## 2017-05-03 ENCOUNTER — Ambulatory Visit: Payer: 59

## 2017-05-03 DIAGNOSIS — G8929 Other chronic pain: Secondary | ICD-10-CM

## 2017-05-03 DIAGNOSIS — M25561 Pain in right knee: Secondary | ICD-10-CM

## 2017-05-03 DIAGNOSIS — R262 Difficulty in walking, not elsewhere classified: Secondary | ICD-10-CM

## 2017-05-03 DIAGNOSIS — M6281 Muscle weakness (generalized): Secondary | ICD-10-CM

## 2017-05-03 NOTE — Patient Instructions (Addendum)
  Sitting on a chair:   Prop your right foot onto a stool to feel a comfortable (small to medium) extension stretch for 2 minutes.    Make sure that your foot is in neutral position.    Rest.    Repeat throughout the day.          Pt was also instructed to ambulate with increased hip and knee flexion and step length at home using his rollator to decrease shuffling. Pt demonstrated and verbalized understanding.

## 2017-05-03 NOTE — Therapy (Signed)
[place] [facility] [address] [place], [address] Phone: [phone]   Fax:  [phone]  Physical Therapy Treatment  Patient Details  Name: Charles Choi MRN: [id number] Date of Birth: 01-27-1951 Referring Provider: Frazier Richards, MD  Encounter Date: 05/03/2017      PT End of Session - 05/03/17 1121    Visit Number 6   Number of Visits 29   Date for PT Re-Evaluation 07/27/17   Authorization Type 6   Authorization Time Period of 10 g-code   PT Start Time 1120   PT Stop Time 1206   PT Time Calculation (min) 46 min   Activity Tolerance Patient tolerated treatment well   Behavior During Therapy [facility] for tasks assessed/performed      Past Medical History:  Diagnosis Date  . Anxiety   . Cancer ([place])   . Depression   . Hypertension   . Parkinson's disease [facility])     Past Surgical History:  Procedure Laterality Date  . BACK SURGERY    . CHOLECYSTECTOMY    . DEEP BRAIN STIMULATOR PLACEMENT     for Parkinson's disease  . JOINT REPLACEMENT     left knee x2    There were no vitals filed for this visit.      Subjective Assessment - 05/03/17 1122    Subjective Parkinson's is better than yesterday. R knee feels like a "hundred dollars." No pain. Has been practicing with his cane.  Felt better around 7 pm with his Parkinson's yesterday.    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  Had PT at [facility] for 7 days.  Used the bike 6-10 minutes, as well as worked on walking, ROM for his R knee.  Started walking with a rw and is currently using a rollator walker.  Pt also adds that he was in a wc since October 2017 until the surgery due to pain.    Patient Stated Goals I want to walk without a walker, improve endurance.   Currently in Pain? No/denies   Pain Score 0-No pain   Pain Onset 1 to 4 weeks ago                                 PT Education - 05/03/17 1150    Education provided Yes   Education Details ther-ex, HEP   Person(s) Educated Patient   Methods Explanation;Demonstration;Tactile cues;Verbal cues   Comprehension Verbalized understanding;Returned demonstration        Objectives  S/P 4weeks   -14 degrees seated knee extension AROM at start of session  Manual therapy  Supine STM to R hamstrings to promote knee extension ROM. Leg propped on pillow.    There-ex    Supine quad set with gentle overpressure from PT to promote knee extension ROM 10x5 seconds for 2 sets  SLR R hip flexion 10x2  S/L R hip abduction 5x2  Sit <> stand from 24.5 inch height 5x, then 10x with bilateral UE assist, emphasis on femoral control   Standing  R hip flexion onto 1st regular step with bilateral UE assist 10x2  Then with L LE 10x 2  To promote hip and knee flexion during gait.   Gait with bilateral UE assist from rollator, emphasis on hip and knee flexion and step length 180 ft  Gave seated R knee extension  stretch as part of his HEP. Pt demonstrated and verbalized understanding. Handout provided.    Improved exercise technique, movement at target joints, use of target muscles after min to mod verbal, visual, tactile cues.      Improved hip and knee flexion as well as step length with gait after cues (decreased shuffling). Slowly improving R knee extension ROM.  Worked on LE strengthening and femoral control as well to promote ability to perform functional tasks at home. Pt tolerated session well without aggravation of symptoms.                   PT Long Term Goals - 04/17/17 1814      PT LONG TERM GOAL #1   Title Patient will improve his seated R knee extension AROM to -8 degrees or less to promote ability to ambulate, perform standing tasks.    Baseline -15 degrees seated R knee extension AROM (04/17/2017)   Time 14   Period Weeks   Status New     PT LONG TERM GOAL  #2   Title Patient will be able to ambulate at least 300 ft without AD and no LOB to promote mobility.    Baseline Pt currently ambulates with a rollator walker (04/17/2017)   Time 14   Period Weeks   Status New     PT LONG TERM GOAL #3   Title Patient will improve his LEFS score by at least 15 points as a demonstration of improved function.    Baseline 23/80 (04/17/2017)   Time 14   Period Weeks   Status New     PT LONG TERM GOAL #4   Title Patient will have at least 4+/5 R knee flexion and extension strength to promote ability to perform functional tasks.    Baseline Strength not yet assessed secondary to pt being only 2 weeks post op to allow for more healing (04/17/2017)   Time 14   Period Weeks   Status New     PT LONG TERM GOAL #5   Title Pt will be able to perform sit <> stand transfer from a regular chair independently without UE assist to promote function.    Baseline bilateral UE assist with sit <> stand transfer (04/17/2017)   Time 14   Period Weeks   Status New               Plan - 05/03/17 1121    Clinical Impression Statement Improved hip and knee flexion as well as step length with gait after cues (decreased shuffling). Slowly improving R knee extension ROM.  Worked on LE strengthening and femoral control as well to promote ability to perform functional tasks at home. Pt tolerated session well without aggravation of symptoms.    Clinical Presentation Stable   Clinical Presentation due to: slowly improving knee extension ROM, improved ability to ambulate and perform sit <> stand transfer   Clinical Decision Making Low   Rehab Potential Good   Clinical Impairments Affecting Rehab Potential chronicity of condition, Parkinson's disease   PT Frequency 2x / week   PT Duration Other (comment)  14 weeks   PT Treatment/Interventions Neuromuscular re-education;Manual techniques;Therapeutic exercise;Therapeutic activities;Aquatic Therapy;Gait training;Patient/family  education   PT Next Visit Plan Knee extension ROM, gait, functional strengthening   Consulted and Agree with Plan of Care Patient      Patient will benefit from skilled therapeutic intervention in order to improve the following deficits and impairments:  Pain, Postural dysfunction, Improper body  mechanics, Difficulty walking, Decreased strength, Decreased range of motion  Visit Diagnosis: Muscle weakness (generalized)  Chronic pain of right knee  Difficulty in walking, not elsewhere classified     Problem List Patient Active Problem List   Diagnosis Date Noted  . Moderate recurrent major depression ([place]) 08/15/2016  . Chest pain due to myocardial ischemia 08/14/2016  . Chest pain, rule out acute myocardial infarction 08/14/2016    Joneen Boers PT, DPT   05/03/2017, 12:21 PM  [place] [facility] [address], [place], [address] Phone: [phone]   Fax:  [phone]  Name: Charles Choi MRN: [id number] Date of Birth: 1951/04/01

## 2017-05-08 ENCOUNTER — Ambulatory Visit: Payer: 59 | Attending: Orthopedic Surgery

## 2017-05-08 DIAGNOSIS — M6281 Muscle weakness (generalized): Secondary | ICD-10-CM | POA: Diagnosis present

## 2017-05-08 DIAGNOSIS — M25561 Pain in right knee: Secondary | ICD-10-CM | POA: Diagnosis present

## 2017-05-08 DIAGNOSIS — R262 Difficulty in walking, not elsewhere classified: Secondary | ICD-10-CM | POA: Diagnosis present

## 2017-05-08 DIAGNOSIS — G8929 Other chronic pain: Secondary | ICD-10-CM | POA: Insufficient documentation

## 2017-05-08 NOTE — Therapy (Signed)
[place] [facility] [address] [place], [address] Phone: [phone]   Fax:  [phone]  Physical Therapy Treatment  Patient Details  Name: Charles Choi MRN: [id number] Date of Birth: 28-Nov-1950 Referring Provider: Frazier Richards, MD  Encounter Date: 05/08/2017      PT End of Session - 05/08/17 1300    Visit Number 7   Number of Visits 29   Date for PT Re-Evaluation 07/27/17   Authorization Type 7   Authorization Time Period of 10 g-code   PT Start Time 1300   PT Stop Time 1344   PT Time Calculation (min) 44 min   Activity Tolerance Patient tolerated treatment well   Behavior During Therapy [facility] for tasks assessed/performed      Past Medical History:  Diagnosis Date  . Anxiety   . Cancer ([place])   . Depression   . Hypertension   . Parkinson's disease [facility])     Past Surgical History:  Procedure Laterality Date  . BACK SURGERY    . CHOLECYSTECTOMY    . DEEP BRAIN STIMULATOR PLACEMENT     for Parkinson's disease  . JOINT REPLACEMENT     left knee x2    There were no vitals filed for this visit.      Subjective Assessment - 05/08/17 1301    Subjective No R knee pain currently. Has been doing laundry and dishes and stuff this morning. Able to drive.    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  Had PT at [facility] for 7 days.  Used the bike 6-10 minutes, as well as worked on walking, ROM for his R knee.  Started walking with a rw and is currently using a rollator walker.  Pt also adds that he was in a wc since October 2017 until the surgery due to pain.    Patient Stated Goals I want to walk without a walker, improve endurance.   Currently in Pain? No/denies   Pain Score 0-No pain   Pain Onset 1 to 4 weeks ago            [facility] PT Assessment - 05/08/17 1307      AROM   Right Knee Extension -15   Right Knee Flexion 126   Left Knee  Extension -27                             PT Education - 05/08/17 1312    Education provided Yes   Education Details ther-ex   [employer]) Educated Patient   Methods Explanation;Demonstration;Tactile cues;Verbal cues   Comprehension Returned demonstration;Verbalized understanding       Objectives  S/P 4- 5 weeks   -15 degrees seated knee extension,  126 degrees seated knee flexion AROM at start of session   There-ex   Standing R TKE with bilateral UE assist to promote knee extension ROM 10x3 with 5 second holds  Sit <> stand from 24.5 inch height 10x with bilateral UE assist, emphasis on femoral control   Then 5x with light touch to no UE assist   Rocker board ankle DF/PF 2 min to promote knee extension ROM  Standing with bilateral UE assist: R hip abduction 10x2  R hip extension 10x2  Gait with bilateral UE assist from rollator, emphasis on hip and knee flexion and step length 100 ft Gait with SPC  on L 20 ft x 2. Able to perform after cues, no LOB. CGA  Side step at treadmill bars 5x each direction   Forward step up onto Air Ex pad with R LE 5x3 with LE UE assist    Improved exercise technique, movement at target joints, use of target muscles after mod verbal, visual, tactile cues.    Worked on gait with SPC today with AD on L side. Able to perform short distances, no LOB. Improved step length overall both with the rollator and SPC. Pt tolerated session well without aggravation of symptoms. Continued working on LE strength and knee extension ROM to promote ability to ambulate and perform functional tasks.             PT Long Term Goals - 04/17/17 1814      PT LONG TERM GOAL #1   Title Patient will improve his seated R knee extension AROM to -8 degrees or less to promote ability to ambulate, perform standing tasks.    Baseline -15 degrees seated R knee extension AROM (04/17/2017)   Time 14   Period Weeks   Status New     PT  LONG TERM GOAL #2   Title Patient will be able to ambulate at least 300 ft without AD and no LOB to promote mobility.    Baseline Pt currently ambulates with a rollator walker (04/17/2017)   Time 14   Period Weeks   Status New     PT LONG TERM GOAL #3   Title Patient will improve his LEFS score by at least 15 points as a demonstration of improved function.    Baseline 23/80 (04/17/2017)   Time 14   Period Weeks   Status New     PT LONG TERM GOAL #4   Title Patient will have at least 4+/5 R knee flexion and extension strength to promote ability to perform functional tasks.    Baseline Strength not yet assessed secondary to pt being only 2 weeks post op to allow for more healing (04/17/2017)   Time 14   Period Weeks   Status New     PT LONG TERM GOAL #5   Title Pt will be able to perform sit <> stand transfer from a regular chair independently without UE assist to promote function.    Baseline bilateral UE assist with sit <> stand transfer (04/17/2017)   Time 14   Period Weeks   Status New               Plan - 05/08/17 1313    Clinical Impression Statement Worked on gait with SPC today with AD on L side. Able to perform short distances, no LOB. Improved step length overall both with the rollator and SPC. Pt tolerated session well without aggravation of symptoms. Continued working on LE strength and knee extension ROM to promote ability to ambulate and perform functional tasks.    History and Personal Factors relevant to plan of care: Parkinson's, chronicity of condition   Clinical Presentation Stable   Clinical Presentation due to: able to ambulate with SPC without LOB, improving foot clearance and step length   Clinical Decision Making Low   Rehab Potential Good   Clinical Impairments Affecting Rehab Potential chronicity of condition, Parkinson's disease   PT Frequency 2x / week   PT Duration Other (comment)  14 weeks   PT Treatment/Interventions Neuromuscular  re-education;Manual techniques;Therapeutic exercise;Therapeutic activities;Aquatic Therapy;Gait training;Patient/family education   PT Next Visit Plan Knee extension ROM,  gait, functional strengthening   Consulted and Agree with Plan of Care Patient      Patient will benefit from skilled therapeutic intervention in order to improve the following deficits and impairments:  Pain, Postural dysfunction, Improper body mechanics, Difficulty walking, Decreased strength, Decreased range of motion  Visit Diagnosis: Chronic pain of right knee  Muscle weakness (generalized)  Difficulty in walking, not elsewhere classified     Problem List Patient Active Problem List   Diagnosis Date Noted  . Moderate recurrent major depression ([employer]) 08/15/2016  . Chest pain due to myocardial ischemia 08/14/2016  . Chest pain, rule out acute myocardial infarction 08/14/2016    Joneen Boers PT, DPT   05/08/2017, 7:10 PM  [place] [facility] [address] [place], [address] Phone: [phone]   Fax:  [phone]  Name: KELSEN CELONA MRN: [id number] Date of Birth: 1951/02/01

## 2017-05-11 ENCOUNTER — Ambulatory Visit: Payer: 59

## 2017-05-11 DIAGNOSIS — M6281 Muscle weakness (generalized): Secondary | ICD-10-CM

## 2017-05-11 DIAGNOSIS — R262 Difficulty in walking, not elsewhere classified: Secondary | ICD-10-CM

## 2017-05-11 DIAGNOSIS — M25561 Pain in right knee: Secondary | ICD-10-CM | POA: Diagnosis not present

## 2017-05-11 DIAGNOSIS — G8929 Other chronic pain: Secondary | ICD-10-CM

## 2017-05-11 NOTE — Therapy (Addendum)
[place] [facility] [address] [place], [address] Phone: [phone]   Fax:  [phone]  Physical Therapy Treatment And Progress Report (G-Code)  Patient Details  Name: Charles Choi MRN: [id number] Date of Birth: Mar 01, 1951 Referring Provider: Florian Buff, MD; Frazier Richards, MD  Encounter Date: 05/11/2017      PT End of Session - 05/11/17 1121    Visit Number 8   Number of Visits 29   Date for PT Re-Evaluation 07/27/17   Authorization Type 1   Authorization Time Period of 10 g-code   PT Start Time 1121   PT Stop Time 1204   PT Time Calculation (min) 43 min   Activity Tolerance Patient tolerated treatment well   Behavior During Therapy [facility] for tasks assessed/performed      Past Medical History:  Diagnosis Date  . Anxiety   . Cancer ([place])   . Depression   . Hypertension   . Parkinson's disease [facility])     Past Surgical History:  Procedure Laterality Date  . BACK SURGERY    . CHOLECYSTECTOMY    . DEEP BRAIN STIMULATOR PLACEMENT     for Parkinson's disease  . JOINT REPLACEMENT     left knee x2    There were no vitals filed for this visit.      Subjective Assessment - 05/11/17 1122    Subjective R knee feels good. No pain currently.    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  Had PT at [facility] for 7 days.  Used the bike 6-10 minutes, as well as worked on walking, ROM for his R knee.  Started walking with a rw and is currently using a rollator walker.  Pt also adds that he was in a wc since October 2017 until the surgery due to pain.    Patient Stated Goals I want to walk without a walker, improve endurance.   Currently in Pain? No/denies   Pain Score 0-No pain   Pain Onset 1 to 4 weeks ago            [facility] PT Assessment - 05/11/17 1238      Assessment   Referring Provider Florian Buff, MD; Frazier Richards, MD                              PT Education - 05/11/17 1133    Education provided Yes   Education Details ther-ex, HEP   Person(s) Educated Patient   Methods Explanation;Demonstration;Tactile cues;Verbal cues   Comprehension Returned demonstration;Verbalized understanding        Objectives  S/P  5 weeks   -16degrees seated knee extension AROM at start of session    There-ex   Muscle energy technique to promote R knee extension AROM  -13 degrees seated R knee extension AROM afterwards  Standing R TKE with bilateral UE assist to promote knee extension ROM 10x3 with 5 second holds  Gait with bilateral UE assist from rollator, emphasis on hip and knee flexion and step length 100 ft  Gait with SPC on L 50 ft x 3. Able to perform after cues, CGA   Pt stops and regroups when feeling off balance. Difficulty at 3rd lap secondary to L knee soreness.   Sit <>stand from 24.5 inch height 10x with bilateral UE assist, emphasis on femoral control  Rocker board ankle DF/PF 2 min to promote knee extension ROM  -13 degrees seated L knee extension AROM afterwards   Standing R gastroc stretch with bilateral UE assist from treadmill bars 5x5 seconds for 2 sets to promote R knee extension  Standing hip abduction with bilateral UE assist 10x2 each LE  Improved exercise technique, movement at target joints, use of target muscles after min to mod verbal, visual, tactile cues.    Improved seated R knee extension AROM after muscle energy technique and ankle DF/PF on rocker board. Pt improving R knee extension AROM slowly. Able to ambulate 50 ft with [facility] CGA but with difficulty at times due to weakness, and L lateral knee soreness. Pt able to achieve 126 degrees seated R knee flexion AROM on 05/08/2017. Difficulty performing standing exercises for his R knee at times due to L lateral knee soreness. Pt still demonstrates LE weakness, limited R knee extension  AROM, difficulty ambulating and performing functional tasks and would benefit from continued skilled physical therapy services to address the aforementioned deficits. Challenges to progress include chronicity of condition, Parkinson's disease, and prolonged wc use prior to surgery (was in a wc since October 2017).          PT Long Term Goals - 05/11/17 1218      PT LONG TERM GOAL #1   Title Patient will improve his seated R knee extension AROM to -8 degrees or less to promote ability to ambulate, perform standing tasks.    Baseline -15 degrees seated R knee extension AROM (04/17/2017); -13 degrees seated knee extension AROM after tx (05/11/2017)   Time 14   Period Weeks   Status On-going     PT LONG TERM GOAL #2   Title Patient will be able to ambulate at least 300 ft without AD and no LOB to promote mobility.    Baseline Pt currently ambulates with a rollator walker (04/17/2017); able to ambulate with SPC on L 50 ft with CGA (05/11/2017)   Time 14   Period Weeks   Status New     PT LONG TERM GOAL #3   Title Patient will improve his LEFS score by at least 15 points as a demonstration of improved function.    Baseline 23/80 (04/17/2017)   Time 14   Period Weeks   Status On-going     PT LONG TERM GOAL #4   Title Patient will have at least 4+/5 R knee flexion and extension strength to promote ability to perform functional tasks.    Time 14   Period Weeks   Status On-going     PT LONG TERM GOAL #5   Title Pt will be able to perform sit <> stand transfer from a regular chair independently without UE assist to promote function.    Baseline bilateral UE assist with sit <> stand transfer (04/17/2017, 05/11/2017)   Time 14   Period Weeks   Status On-going               Plan - 05/11/17 1121    Clinical Impression Statement Improved seated R knee extension AROM after muscle energy technique and ankle DF/PF on rocker board. Pt improving R knee extension AROM slowly. Able to ambulate  50 ft with [facility] CGA but with difficulty at times due to weakness, and L lateral knee soreness. Pt able to achieve 126 degrees seated R knee flexion AROM on 05/08/2017. Difficulty performing standing exercises for his R knee at times due to L lateral  knee soreness. Pt still demonstrates LE weakness, limited R knee extension AROM, difficulty ambulating and performing functional tasks and would benefit from continued skilled physical therapy services to address the aforementioned deficits. Challenges to progress include chronicity of condition, Parkinson's disease, and prolonged wc use prior to surgery (was in a wc since October 2017).    History and Personal Factors relevant to plan of care: Parkinson's, chronicity of condition   Clinical Presentation Stable   Clinical Presentation due to: able to ambulate wiht SPC CGA, improving foot clearance and step length, good knee flexion ROM   Clinical Decision Making Low   Rehab Potential Good   Clinical Impairments Affecting Rehab Potential chronicity of condition, Parkinson's disease   PT Frequency 2x / week   PT Duration Other (comment)  14 weeks   PT Treatment/Interventions Neuromuscular re-education;Manual techniques;Therapeutic exercise;Therapeutic activities;Aquatic Therapy;Gait training;Patient/family education   PT Next Visit Plan Knee extension ROM, gait, functional strengthening   Consulted and Agree with Plan of Care Patient      Patient will benefit from skilled therapeutic intervention in order to improve the following deficits and impairments:  Pain, Postural dysfunction, Improper body mechanics, Difficulty walking, Decreased strength, Decreased range of motion  Visit Diagnosis: Chronic pain of right knee  Muscle weakness (generalized)  Difficulty in walking, not elsewhere classified       G-Codes - 05-29-17 1232    Functional Assessment Tool Used (Outpatient Only) clinical presentation, patient interview   Functional Limitation  Mobility: Walking and moving around   Mobility: Walking and Moving Around Current Status (G5003) At least 60 percent but less than 80 percent impaired, limited or restricted   Mobility: Walking and Moving Around Goal Status [phone]) At least 20 percent but less than 40 percent impaired, limited or restricted      Problem List Patient Active Problem List   Diagnosis Date Noted  . Moderate recurrent major depression ([place]) 08/15/2016  . Chest pain due to myocardial ischemia 08/14/2016  . Chest pain, rule out acute myocardial infarction 08/14/2016   Thank you for your referral.  Joneen Boers PT, DPT   May 29, 2017, 12:44 PM  [place] [facility] [address] [place], [address] Phone: [phone]   Fax:  [phone]  Name: RUSTON FEDORA MRN: [id number] Date of Birth: 12/11/1950

## 2017-05-11 NOTE — Patient Instructions (Signed)
Gave standing L hip abduction with bilateral UE assist 10x for 3 sessions daily as part of his HEP to promote R glute med use in closed chain position. Pt demonstrated and verbalized understanding.

## 2017-05-15 ENCOUNTER — Ambulatory Visit: Payer: 59

## 2017-05-15 DIAGNOSIS — T84038A Mechanical loosening of other internal prosthetic joint, initial encounter: Secondary | ICD-10-CM | POA: Insufficient documentation

## 2017-05-15 DIAGNOSIS — Z96659 Presence of unspecified artificial knee joint: Secondary | ICD-10-CM | POA: Insufficient documentation

## 2017-05-16 ENCOUNTER — Ambulatory Visit: Payer: 59

## 2017-05-16 DIAGNOSIS — M6281 Muscle weakness (generalized): Secondary | ICD-10-CM

## 2017-05-16 DIAGNOSIS — M25561 Pain in right knee: Principal | ICD-10-CM

## 2017-05-16 DIAGNOSIS — G8929 Other chronic pain: Secondary | ICD-10-CM

## 2017-05-16 DIAGNOSIS — R262 Difficulty in walking, not elsewhere classified: Secondary | ICD-10-CM

## 2017-05-16 NOTE — Therapy (Signed)
[place] [facility] [address] [place], [address] Phone: [phone]   Fax:  [phone]  Physical Therapy Treatment  Patient Details  Name: Charles Choi MRN: [id number] Date of Birth: 1950-12-27 Referring Provider: Florian Buff, MD; Frazier Richards, MD  Encounter Date: 05/16/2017      PT End of Session - 05/16/17 1616    Visit Number 9   Number of Visits 29   Date for PT Re-Evaluation 07/27/17   Authorization Type 2   Authorization Time Period of 10 g-code   PT Start Time 1617   PT Stop Time 1700   PT Time Calculation (min) 43 min   Activity Tolerance Patient tolerated treatment well   Behavior During Therapy [facility] for tasks assessed/performed      Past Medical History:  Diagnosis Date  . Anxiety   . Cancer ([place])   . Depression   . Hypertension   . Parkinson's disease [facility])     Past Surgical History:  Procedure Laterality Date  . BACK SURGERY    . CHOLECYSTECTOMY    . DEEP BRAIN STIMULATOR PLACEMENT     for Parkinson's disease  . JOINT REPLACEMENT     left knee x2    There were no vitals filed for this visit.      Subjective Assessment - 05/16/17 1618    Subjective R knee is fine. Was out all day Saturday.  L knee is very sore (lateral knee) and it's slowing me down.    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  Had PT at [facility] for 7 days.  Used the bike 6-10 minutes, as well as worked on walking, ROM for his R knee.  Started walking with a rw and is currently using a rollator walker.  Pt also adds that he was in a wc since October 2017 until the surgery due to pain.    Patient Stated Goals I want to walk without a walker, improve endurance.   Currently in Pain? No/denies   Pain Score 0-No pain   Pain Onset 1 to 4 weeks ago                                 PT Education - 05/16/17 1703    Education  provided Yes   Education Details ther-ex   [employer]) Educated Patient   Methods Explanation;Demonstration;Tactile cues;Verbal cues   Comprehension Returned demonstration;Verbalized understanding        Objectives  S/P  6 weeks  Observed a cut R anterior knee at incision. Pt states hitting his R knee in front of a cabinet/table last night. Covered with a band aid at clinic. Pt was instructed to keep his wound clean.  Pt was informed to call his doctor if there are signs of infection. Pt verbalized understanding.  -20degrees seated knee extension AROM at start of session    There-ex   Muscle energy technique to promote R knee extension AROM             -15 degrees seated R knee extension AROM afterwards  Seated R knee extension 10x 10 seconds to promote knee extension  Standing L hip abduction with bilateral UE assist to promote R glute med strengthening in closed chain positions 10x3  Rocker board ankle DF/PF 2 min to promote knee extension ROM  Standing mini squats  with bilateral UE assist 10x2 with bilateral knee extension and glute squeeze at the standing position to promote knee extension  Standing R TKE 10x 10 seconds with bilateral UE assist   Single leg stand on R LE with bilateral UE assist 10x2 with 5 second holds  Standing R gastroc stretch with bilateral UE assist from treadmill bars 5x5 seconds for 2 sets to promote R knee extension  -15 degrees seated R knee extension at end of session.   Improved exercise technique, movement at target joints, use of target muscles after min to mod verbal, visual, tactile cues.    Difficulty with standing exercises for R LE secondary to L knee pain. Held off gait with SPC secondary to difficulty with weight bearing with L LE. Seated R knee extension ROM improved to -15 degrees after exercises. Continued working on LE strengthening and R knee extension ROM to promote ability to perform functional tasks.            PT Long Term Goals - 05/11/17 1218      PT LONG TERM GOAL #1   Title Patient will improve his seated R knee extension AROM to -8 degrees or less to promote ability to ambulate, perform standing tasks.    Baseline -15 degrees seated R knee extension AROM (04/17/2017); -13 degrees seated knee extension AROM after tx (05/11/2017)   Time 14   Period Weeks   Status On-going     PT LONG TERM GOAL #2   Title Patient will be able to ambulate at least 300 ft without AD and no LOB to promote mobility.    Baseline Pt currently ambulates with a rollator walker (04/17/2017); able to ambulate with SPC on L 50 ft with CGA (05/11/2017)   Time 14   Period Weeks   Status New     PT LONG TERM GOAL #3   Title Patient will improve his LEFS score by at least 15 points as a demonstration of improved function.    Baseline 23/80 (04/17/2017)   Time 14   Period Weeks   Status On-going     PT LONG TERM GOAL #4   Title Patient will have at least 4+/5 R knee flexion and extension strength to promote ability to perform functional tasks.    Time 14   Period Weeks   Status On-going     PT LONG TERM GOAL #5   Title Pt will be able to perform sit <> stand transfer from a regular chair independently without UE assist to promote function.    Baseline bilateral UE assist with sit <> stand transfer (04/17/2017, 05/11/2017)   Time 14   Period Weeks   Status On-going               Plan - 05/16/17 1616    Clinical Impression Statement Difficulty with standing exercises for R LE secondary to L knee pain. Held off gait with SPC secondary to difficulty with weight bearing with L LE. Seated R knee extension ROM improved to -15 degrees after exercises. Continued working on LE strengthening and R knee extension ROM to promote ability to perform functional tasks.    History and Personal Factors relevant to plan of care: Parkinson's, chronicity of condition. L knee pain which makes walking with SPC for R knee difficult     Clinical Presentation Stable   Clinical Presentation due to: good R knee flexion ROM, slight improved R knee extension after tx.    Clinical Decision Making Low  Rehab Potential Good   Clinical Impairments Affecting Rehab Potential chronicity of condition, Parkinson's disease   PT Frequency 2x / week   PT Duration Other (comment)  14 weeks   PT Treatment/Interventions Neuromuscular re-education;Manual techniques;Therapeutic exercise;Therapeutic activities;Aquatic Therapy;Gait training;Patient/family education   PT Next Visit Plan Knee extension ROM, gait, functional strengthening   Consulted and Agree with Plan of Care Patient      Patient will benefit from skilled therapeutic intervention in order to improve the following deficits and impairments:  Pain, Postural dysfunction, Improper body mechanics, Difficulty walking, Decreased strength, Decreased range of motion  Visit Diagnosis: Chronic pain of right knee  Muscle weakness (generalized)  Difficulty in walking, not elsewhere classified     Problem List Patient Active Problem List   Diagnosis Date Noted  . Moderate recurrent major depression ([place]) 08/15/2016  . Chest pain due to myocardial ischemia 08/14/2016  . Chest pain, rule out acute myocardial infarction 08/14/2016    Joneen Boers PT, DPT   05/16/2017, 6:24 PM  [place] [facility] [address] [place], [address] Phone: [phone]   Fax:  [phone]  Name: Charles Choi MRN: [id number] Date of Birth: [birth year]

## 2017-05-18 ENCOUNTER — Ambulatory Visit: Payer: 59

## 2017-05-18 DIAGNOSIS — M25561 Pain in right knee: Secondary | ICD-10-CM | POA: Diagnosis not present

## 2017-05-18 DIAGNOSIS — R262 Difficulty in walking, not elsewhere classified: Secondary | ICD-10-CM

## 2017-05-18 DIAGNOSIS — G8929 Other chronic pain: Secondary | ICD-10-CM

## 2017-05-18 DIAGNOSIS — M6281 Muscle weakness (generalized): Secondary | ICD-10-CM

## 2017-05-18 NOTE — Therapy (Signed)
[place] [facility] [address], [place], [address] Phone: [phone]   Fax:  [phone]  Physical Therapy Treatment  Patient Details  Name: Charles Choi MRN: [id number] Date of Birth: 1951-06-27 Referring Provider: Florian Buff, MD; Frazier Richards, MD  Encounter Date: 05/18/2017      PT End of Session - 05/18/17 1507    Visit Number 10   Number of Visits 29   Date for PT Re-Evaluation 07/27/17   Authorization Type 3   Authorization Time Period of 10 g-code   PT Start Time 1507   PT Stop Time 1551   PT Time Calculation (min) 44 min   Activity Tolerance Patient tolerated treatment well   Behavior During Therapy [facility] for tasks assessed/performed      Past Medical History:  Diagnosis Date  . Anxiety   . Cancer ([place])   . Depression   . Hypertension   . Parkinson's disease [facility])     Past Surgical History:  Procedure Laterality Date  . BACK SURGERY    . CHOLECYSTECTOMY    . DEEP BRAIN STIMULATOR PLACEMENT     for Parkinson's disease  . JOINT REPLACEMENT     left knee x2    There were no vitals filed for this visit.      Subjective Assessment - 05/18/17 1509    Subjective R knee is good. Pt states that he fell yesterday. Pt did not use his rollator. Pt tried to walk with his cane for 5 minutes. Felt a lot of muscle burn both thighs (quadriceps) and could not support himself. Thinks he over did it Saturday.    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  Had PT at [facility] for 7 days.  Used the bike 6-10 minutes, as well as worked on walking, ROM for his R knee.  Started walking with a rw and is currently using a rollator walker.  Pt also adds that he was in a wc since October 2017 until the surgery due to pain.    Patient Stated Goals I want to walk without a walker, improve endurance.   Currently in Pain? No/denies   Pain Score 0-No pain    Pain Onset 1 to 4 weeks ago                                 PT Education - 05/18/17 1520    Education provided Yes   Education Details ther-ex   [employer]) Educated Patient   Methods Explanation;Demonstration;Tactile cues;Verbal cues   Comprehension Returned demonstration;Verbalized understanding       Objectives  R knee lateral scrapes observed. Scab formation at cut at scar tissue. No signs of infection. Pt was recommended to contact his MD if he sees any signs or symptoms of infection. Pt verbalized understanding.    -17degrees seated knee extension AROM at start of session    There-ex   Pt was recommended to not use his SPC at home and to use the rollator only for safety. Pt verbalized understanding  Seated knee extension AROM throughout session  Muscle energy technique to promote R knee extension AROM -15 degrees seated R knee extension AROM afterwards  Seated R knee extension 10x 10 seconds to promote knee extension  Single leg stand on R LE with bilateral UE assist 10x2 with 5 second holds  Standing L hip abduction with bilateral UE assist to promote R glute med strengthening in closed chain position 10x3  Forward step up with R LE onto 3 inch step with bilateral UE assist 10x2  Lateral step up onto 3 inch step with R LE with bilateral UE assist 10x2  Standing heel-toe raises with bilateral UE assist 10x each way   Standing R gastroc stretch with bilateral UE assist from treadmill bars 5x5 seconds for 2 sets to promote R knee extension  Standing mini squats with bilateral UE assist 5x2     Improved exercise technique, movement at target joints, use of target muscles after min to mod verbal, visual, tactile cues.   Continued working on R knee extension ROM as well as strengthening to promote ability to perform standing tasks at home such as walking. No complain of increased R knee pain throughout session.            PT Long Term Goals - 05/11/17 1218      PT LONG TERM GOAL #1   Title Patient will improve his seated R knee extension AROM to -8 degrees or less to promote ability to ambulate, perform standing tasks.    Baseline -15 degrees seated R knee extension AROM (04/17/2017); -13 degrees seated knee extension AROM after tx (05/11/2017)   Time 14   Period Weeks   Status On-going     PT LONG TERM GOAL #2   Title Patient will be able to ambulate at least 300 ft without AD and no LOB to promote mobility.    Baseline Pt currently ambulates with a rollator walker (04/17/2017); able to ambulate with SPC on L 50 ft with CGA (05/11/2017)   Time 14   Period Weeks   Status New     PT LONG TERM GOAL #3   Title Patient will improve his LEFS score by at least 15 points as a demonstration of improved function.    Baseline 23/80 (04/17/2017)   Time 14   Period Weeks   Status On-going     PT LONG TERM GOAL #4   Title Patient will have at least 4+/5 R knee flexion and extension strength to promote ability to perform functional tasks.    Time 14   Period Weeks   Status On-going     PT LONG TERM GOAL #5   Title Pt will be able to perform sit <> stand transfer from a regular chair independently without UE assist to promote function.    Baseline bilateral UE assist with sit <> stand transfer (04/17/2017, 05/11/2017)   Time 14   Period Weeks   Status On-going               Plan - 05/18/17 1529    Clinical Impression Statement Continued working on R knee extension ROM as well as strengthening to promote ability to perform standing tasks at home such as walking. No complain of increased R knee pain throughout session.    History and Personal Factors relevant to plan of care: Parkinson's, chronicity of condition. L knee pain which makes walking with SPC for R knee difficult    Clinical Presentation Stable   Clinical Presentation due to: slight improved R knee extension ROM with tx   Clinical  Decision Making Low   Rehab Potential Good   Clinical Impairments Affecting Rehab Potential chronicity of condition, Parkinson's disease   PT Frequency 2x / week   PT Duration Other (comment)  14 weeks   PT  Treatment/Interventions Neuromuscular re-education;Manual techniques;Therapeutic exercise;Therapeutic activities;Aquatic Therapy;Gait training;Patient/family education   PT Next Visit Plan Knee extension ROM, gait, functional strengthening   Consulted and Agree with Plan of Care Patient      Patient will benefit from skilled therapeutic intervention in order to improve the following deficits and impairments:  Pain, Postural dysfunction, Improper body mechanics, Difficulty walking, Decreased strength, Decreased range of motion  Visit Diagnosis: Chronic pain of right knee  Muscle weakness (generalized)  Difficulty in walking, not elsewhere classified     Problem List Patient Active Problem List   Diagnosis Date Noted  . Moderate recurrent major depression ([place]) 08/15/2016  . Chest pain due to myocardial ischemia 08/14/2016  . Chest pain, rule out acute myocardial infarction 08/14/2016    Joneen Boers PT, DPT   05/18/2017, 7:23 PM  [place] [facility] [address], [place], [address] Phone: [phone]   Fax:  [phone]  Name: Charles Choi MRN: [id number] Date of Birth: 1951-08-19

## 2017-05-22 ENCOUNTER — Ambulatory Visit: Payer: 59

## 2017-05-22 DIAGNOSIS — R262 Difficulty in walking, not elsewhere classified: Secondary | ICD-10-CM

## 2017-05-22 DIAGNOSIS — M25561 Pain in right knee: Principal | ICD-10-CM

## 2017-05-22 DIAGNOSIS — G8929 Other chronic pain: Secondary | ICD-10-CM

## 2017-05-22 DIAGNOSIS — M6281 Muscle weakness (generalized): Secondary | ICD-10-CM

## 2017-05-22 NOTE — Therapy (Signed)
[facility] [address], [place], [address] Phone: [phone]   Fax:  [phone]  Physical Therapy Treatment  Patient Details  Name: Charles Choi MRN: [id number] Date of Birth: September 20, 1951 Referring Provider: Florian Buff, MD; Frazier Richards, MD  Encounter Date: 05/22/2017      PT End of Session - 05/22/17 1436    Visit Number 11   Number of Visits 29   Date for PT Re-Evaluation 07/27/17   Authorization Type 4   Authorization Time Period of 10 g-code   PT Start Time 1436   PT Stop Time 1520   PT Time Calculation (min) 44 min   Activity Tolerance Patient tolerated treatment well   Behavior During Therapy [facility] for tasks assessed/performed      Past Medical History:  Diagnosis Date  . Anxiety   . Cancer ([place])   . Depression   . Hypertension   . Parkinson's disease [facility])     Past Surgical History:  Procedure Laterality Date  . BACK SURGERY    . CHOLECYSTECTOMY    . DEEP BRAIN STIMULATOR PLACEMENT     for Parkinson's disease  . JOINT REPLACEMENT     left knee x2    There were no vitals filed for this visit.      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 Got tired. It was good for him. His R knee did good. Everything went well. L knee feels sore with a little numb feeling (but its been like that before), maybe a little better but not that much. R knee is good. No pain.  L knee TKA is scheduled on August 08, 2017.    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  Had PT at [facility] for 7 days.  Used the bike 6-10 minutes, as well as worked on walking, ROM for his R knee.  Started walking with a rw and is currently using a rollator walker.  Pt also adds that he was in a wc since October 2017 until the surgery due to pain.    Patient Stated Goals I want  to walk without a walker, improve endurance.   Currently in Pain? No/denies   Pain Score 0-No pain   Pain Onset 1 to 4 weeks ago                                 PT Education - 05/22/17 1444    Education provided Yes   Education Details ther-ex   [employer]) Educated Patient   Methods Explanation;Demonstration;Tactile cues;Verbal cues   Comprehension Verbalized understanding;Returned demonstration        Objectives  7 weeks  Post op   Scab formation observed R knee No signs of infection.  -18degrees seated knee extension AROM at start of session    There-ex    Seated knee extension AROM throughout session  Muscle energy technique to promote R knee extension AROM -15degrees seated R knee extension AROM afterwards  Seated R knee extension 10x 10 seconds to promote knee extension ROM  - 14 degrees seated L knee extension   Sit <> stand from 21 inch height mat table with bilateral UE assist to stand 5x3 to promote LE strength and decrease difficulty with performing transfers.  Cues for femoral control   Standing L hip abduction with bilateral UE assist to promote R glute med strengthening in closed chain position 10x3   Standing heel-toe raises with bilateral UE assist 10x each way   Standing R gastroc stretch with bilateral UE assist from treadmill bars 5x5 seconds for 2 sets to promote R knee extension   Lateral step up onto 3 inch step with R LE with bilateral UE assist 10x2.  Single leg stand on R LE with bilateral UE assist 10x2 with 5 second holds for glute med strengthening   Forward step up with R LE onto 3 inch step with bilateral UE assist 10x2   Standing mini squats with bilateral UE assist 5x2, cues for femoral control.      Improved exercise technique, movement at target joints, use of target muscles after min to mod verbal, visual, tactile cues.    Improved seated R knee extension ROM  following muscle energy technique and seated R knee extension with 10 second holds. Continued working on LE strengthening to help promote ability to perform functional tasks at home. Improved femoral control with closed chain activities after cues. Pt tolerated session well without aggravation of symptoms.                    PT Long Term Goals - 05/11/17 1218      PT LONG TERM GOAL #1   Title Patient will improve his seated R knee extension AROM to -8 degrees or less to promote ability to ambulate, perform standing tasks.    Baseline -15 degrees seated R knee extension AROM (04/17/2017); -13 degrees seated knee extension AROM after tx (05/11/2017)   Time 14   Period Weeks   Status On-going     PT LONG TERM GOAL #2   Title Patient will be able to ambulate at least 300 ft without AD and no LOB to promote mobility.    Baseline Pt currently ambulates with a rollator walker (04/17/2017); able to ambulate with SPC on L 50 ft with CGA (05/11/2017)   Time 14   Period Weeks   Status New     PT LONG TERM GOAL #3   Title Patient will improve his LEFS score by at least 15 points as a demonstration of improved function.    Baseline 23/80 (04/17/2017)   Time 14   Period Weeks   Status On-going     PT LONG TERM GOAL #4   Title Patient will have at least 4+/5 R knee flexion and extension strength to promote ability to perform functional tasks.    Time 14   Period Weeks   Status On-going     PT LONG TERM GOAL #5   Title Pt will be able to perform sit <> stand transfer from a regular chair independently without UE assist to promote function.    Baseline bilateral UE assist with sit <> stand transfer (04/17/2017, 05/11/2017)   Time 14   Period Weeks   Status On-going               Plan - 05/22/17 1446    Clinical Impression Statement Improved seated R knee extension ROM following muscle energy technique and seated R knee extension with 10 second holds. Continued working on  LE strengthening to help promote ability to perform functional tasks at home. Improved femoral control with closed chain activities after cues. Pt tolerated session well without aggravation of symptoms.    History and Personal Factors  relevant to plan of care: Parkinson's, chronicity of condition. L knee pain which makes walking with SPC for R knee difficult    Clinical Presentation Stable   Clinical Presentation due to: no pain in R knee   Clinical Decision Making Low   Rehab Potential Good   Clinical Impairments Affecting Rehab Potential chronicity of condition, Parkinson's disease   PT Frequency 2x / week   PT Duration Other (comment)  14 weeks   PT Treatment/Interventions Neuromuscular re-education;Manual techniques;Therapeutic exercise;Therapeutic activities;Aquatic Therapy;Gait training;Patient/family education   PT Next Visit Plan Knee extension ROM, gait, functional strengthening   Consulted and Agree with Plan of Care Patient      Patient will benefit from skilled therapeutic intervention in order to improve the following deficits and impairments:  Pain, Postural dysfunction, Improper body mechanics, Difficulty walking, Decreased strength, Decreased range of motion  Visit Diagnosis: Chronic pain of right knee  Muscle weakness (generalized)  Difficulty in walking, not elsewhere classified     Problem List Patient Active Problem List   Diagnosis Date Noted  . Moderate recurrent major depression ([place]) 08/15/2016  . Chest pain due to myocardial ischemia 08/14/2016  . Chest pain, rule out acute myocardial infarction 08/14/2016    Joneen Boers PT, DPT   05/22/2017, 7:27 PM  [facility] [address], [place], [address] Phone: [phone]   Fax:  [phone]  Name: KOBIE WHIDBY MRN: [id number] Date of Birth: 10-01-1951

## 2017-05-25 ENCOUNTER — Ambulatory Visit: Payer: 59

## 2017-05-25 DIAGNOSIS — R262 Difficulty in walking, not elsewhere classified: Secondary | ICD-10-CM

## 2017-05-25 DIAGNOSIS — G8929 Other chronic pain: Secondary | ICD-10-CM

## 2017-05-25 DIAGNOSIS — M25561 Pain in right knee: Secondary | ICD-10-CM | POA: Diagnosis not present

## 2017-05-25 DIAGNOSIS — M6281 Muscle weakness (generalized): Secondary | ICD-10-CM

## 2017-05-25 NOTE — Therapy (Signed)
[facility] [address], [place], [address] Phone: [phone]   Fax:  [phone]  Physical Therapy Treatment  Patient Details  Name: JESTON JUNKINS MRN: [id number] Date of Birth: Apr 29, 1951 Referring Provider: Florian Buff, MD; Frazier Richards, MD  Encounter Date: 05/25/2017      PT End of Session - 05/25/17 1437    Visit Number 12   Number of Visits 29   Date for PT Re-Evaluation 07/27/17   Authorization Type 5   Authorization Time Period of 10 g-code   PT Start Time 1438  pt arrived late   PT Stop Time 1520   PT Time Calculation (min) 42 min   Activity Tolerance Patient tolerated treatment well   Behavior During Therapy [facility] for tasks assessed/performed      Past Medical History:  Diagnosis Date  . Anxiety   . Cancer ([place])   . Depression   . Hypertension   . Parkinson's disease [facility])     Past Surgical History:  Procedure Laterality Date  . BACK SURGERY    . CHOLECYSTECTOMY    . DEEP BRAIN STIMULATOR PLACEMENT     for Parkinson's disease  . JOINT REPLACEMENT     left knee x2    There were no vitals filed for this visit.      Subjective Assessment - 05/25/17 1442    Subjective Pt states being out of town next week. Tired today because he has been on the go, doing errands and chores.  R knee feels good, no pain.  L knee is better.    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  Had PT at [facility] for 7 days.  Used the bike 6-10 minutes, as well as worked on walking, ROM for his R knee.  Started walking with a rw and is currently using a rollator walker.  Pt also adds that he was in a wc since October 2017 until the surgery due to pain.    Patient Stated Goals I want to walk without a walker, improve endurance.   Currently in Pain? No/denies   Pain Score 0-No pain   Pain Onset 1 to 4 weeks ago                                  PT Education - 05/25/17 1445    Education provided Yes   Education Details ther-ex   [employer]) Educated Patient   Methods Explanation;Demonstration;Verbal cues;Tactile cues   Comprehension Returned demonstration;Verbalized understanding        Objectives    Scab formation observed R knee No signs of infection.  -16degrees seated knee extension AROM at start of session    There-ex    Seated knee extension AROM throughout session  Muscle energy technique to promote R knee extension AROM  Sit <> stand from 21 inch height mat table with bilateral UE assist to stand 5x3 to promote LE strength and decrease difficulty with performing transfers. Cues for femoral control and foot positioning  Forward step up with R LE onto 3 inch step with bilateral UE assist 10x2  Then with 1 riser 5x2  Gait with SPC on L x 20 ft. Difficulty secondary to L knee pain.   Lateral step up onto 3 inch step with bilateral UE assist 10x2  standing L hip abduction with bilateral  UE assist 10x5 second for 3 sets to promote R glute med strengthening in closed chain  Seated R knee extension 10x10 seconds to promote knee extension ROM   Single leg stand on R LE with bilateral UE assist 10x2 with 5 second holds for glute med strengthening      Improved exercise technique, movement at target joints, use of target muscles after min to mod verbal, visual, tactile cues.     Tried gait with SPC on L side but difficulty secondary to L knee pain.  Continued working on R LE strength to promote ability to perform transfers and functional tasks at home. Pt tolerated session well without aggravation of symptoms.          PT Long Term Goals - 05/11/17 1218      PT LONG TERM GOAL #1   Title Patient will improve his seated R knee extension AROM to -8 degrees or less to promote ability to ambulate, perform standing tasks.     Baseline -15 degrees seated R knee extension AROM (04/17/2017); -13 degrees seated knee extension AROM after tx (05/11/2017)   Time 14   Period Weeks   Status On-going     PT LONG TERM GOAL #2   Title Patient will be able to ambulate at least 300 ft without AD and no LOB to promote mobility.    Baseline Pt currently ambulates with a rollator walker (04/17/2017); able to ambulate with SPC on L 50 ft with CGA (05/11/2017)   Time 14   Period Weeks   Status New     PT LONG TERM GOAL #3   Title Patient will improve his LEFS score by at least 15 points as a demonstration of improved function.    Baseline 23/80 (04/17/2017)   Time 14   Period Weeks   Status On-going     PT LONG TERM GOAL #4   Title Patient will have at least 4+/5 R knee flexion and extension strength to promote ability to perform functional tasks.    Time 14   Period Weeks   Status On-going     PT LONG TERM GOAL #5   Title Pt will be able to perform sit <> stand transfer from a regular chair independently without UE assist to promote function.    Baseline bilateral UE assist with sit <> stand transfer (04/17/2017, 05/11/2017)   Time 14   Period Weeks   Status On-going               Plan - 05/25/17 1450    Clinical Impression Statement Tried gait with SPC on L side but difficulty secondary to L knee pain.  Continued working on R LE strength to promote ability to perform transfers and functional tasks at home. Pt tolerated session well without aggravation of symptoms.    History and Personal Factors relevant to plan of care: Parkinson's, chronicity of condition. L knee pain which makes walking with SPC for R knee difficult    Clinical Presentation Stable   Clinical Presentation due to: no pain in R knee, decreased pain in L knee   Clinical Decision Making Low   Rehab Potential Good   Clinical Impairments Affecting Rehab Potential chronicity of condition, Parkinson's disease   PT Frequency 2x / week   PT Duration Other  (comment)  14 weeks   PT Treatment/Interventions Neuromuscular re-education;Manual techniques;Therapeutic exercise;Therapeutic activities;Aquatic Therapy;Gait training;Patient/family education   PT Next Visit Plan Knee extension ROM, gait, functional strengthening   Consulted and  Agree with Plan of Care Patient      Patient will benefit from skilled therapeutic intervention in order to improve the following deficits and impairments:  Pain, Postural dysfunction, Improper body mechanics, Difficulty walking, Decreased strength, Decreased range of motion  Visit Diagnosis: Chronic pain of right knee  Muscle weakness (generalized)  Difficulty in walking, not elsewhere classified     Problem List Patient Active Problem List   Diagnosis Date Noted  . Moderate recurrent major depression ([place]) 08/15/2016  . Chest pain due to myocardial ischemia 08/14/2016  . Chest pain, rule out acute myocardial infarction 08/14/2016   Joneen Boers PT, DPT   05/25/2017, 7:00 PM  [facility] [address] [place], [address] Phone: [phone]   Fax:  [phone]  Name: AINSLEY SANGUINETTI MRN: [id number] Date of Birth: July 28, 1951

## 2017-06-05 ENCOUNTER — Ambulatory Visit: Payer: 59

## 2017-07-03 DIAGNOSIS — F39 Unspecified mood [affective] disorder: Secondary | ICD-10-CM | POA: Diagnosis not present

## 2017-07-03 DIAGNOSIS — T84093A Other mechanical complication of internal left knee prosthesis, initial encounter: Secondary | ICD-10-CM

## 2017-07-03 DIAGNOSIS — K219 Gastro-esophageal reflux disease without esophagitis: Secondary | ICD-10-CM | POA: Diagnosis not present

## 2017-07-03 DIAGNOSIS — G20 Parkinson's disease: Secondary | ICD-10-CM | POA: Diagnosis not present

## 2017-07-03 DIAGNOSIS — I10 Essential (primary) hypertension: Secondary | ICD-10-CM | POA: Diagnosis not present

## 2017-07-10 DIAGNOSIS — T8454XA Infection and inflammatory reaction due to internal left knee prosthesis, initial encounter: Secondary | ICD-10-CM | POA: Insufficient documentation

## 2017-07-26 DIAGNOSIS — M25512 Pain in left shoulder: Secondary | ICD-10-CM | POA: Insufficient documentation

## 2017-11-03 ENCOUNTER — Ambulatory Visit: Payer: Self-pay | Admitting: Urology

## 2017-11-03 IMAGING — CR DG CHEST 2V
2 series · 2 of 2 positions shown · non-contrast
Comparison: 07/25/2015

CLINICAL DATA: Pt to ED c/o chest tightness for the last hour with
shortness of breath; h/o htn, parkinson's disease

EXAM:
CHEST  2 VIEW

[chest lat]
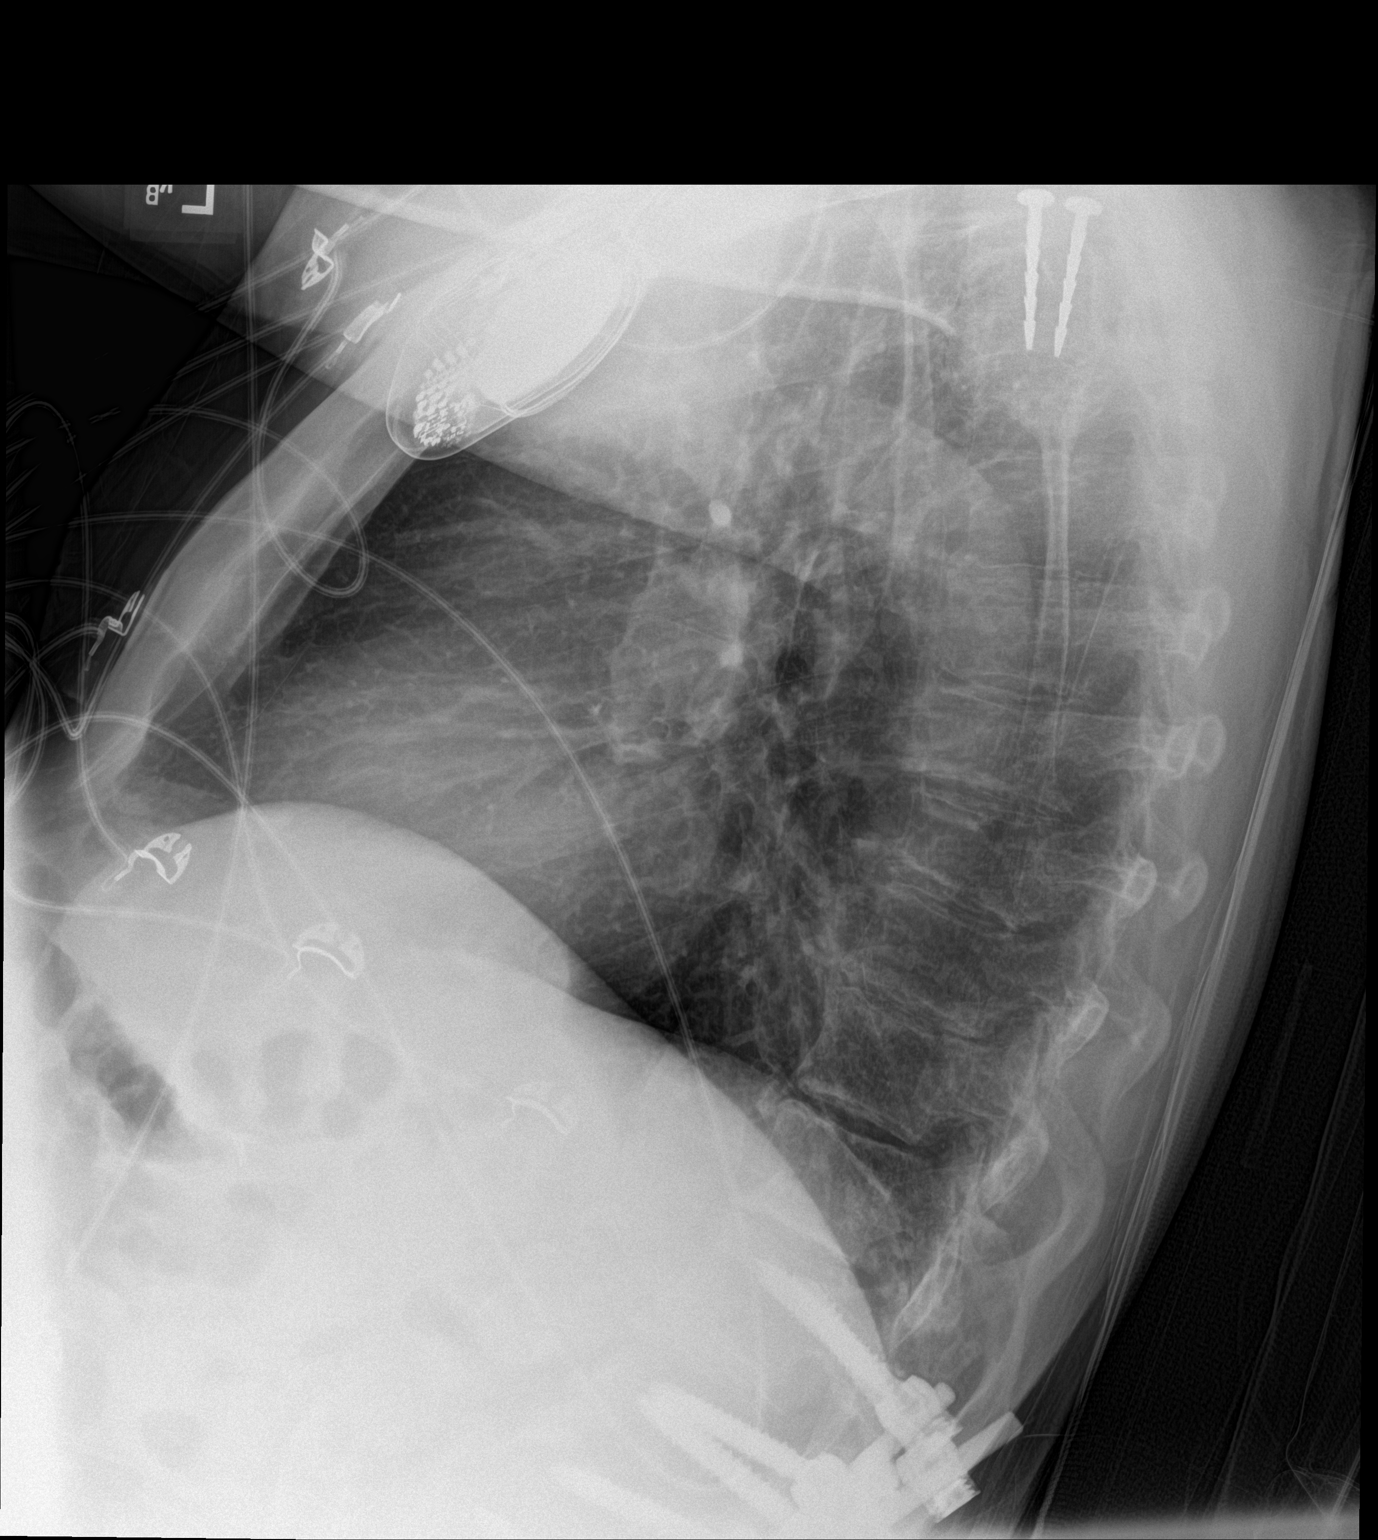

[chest ap]
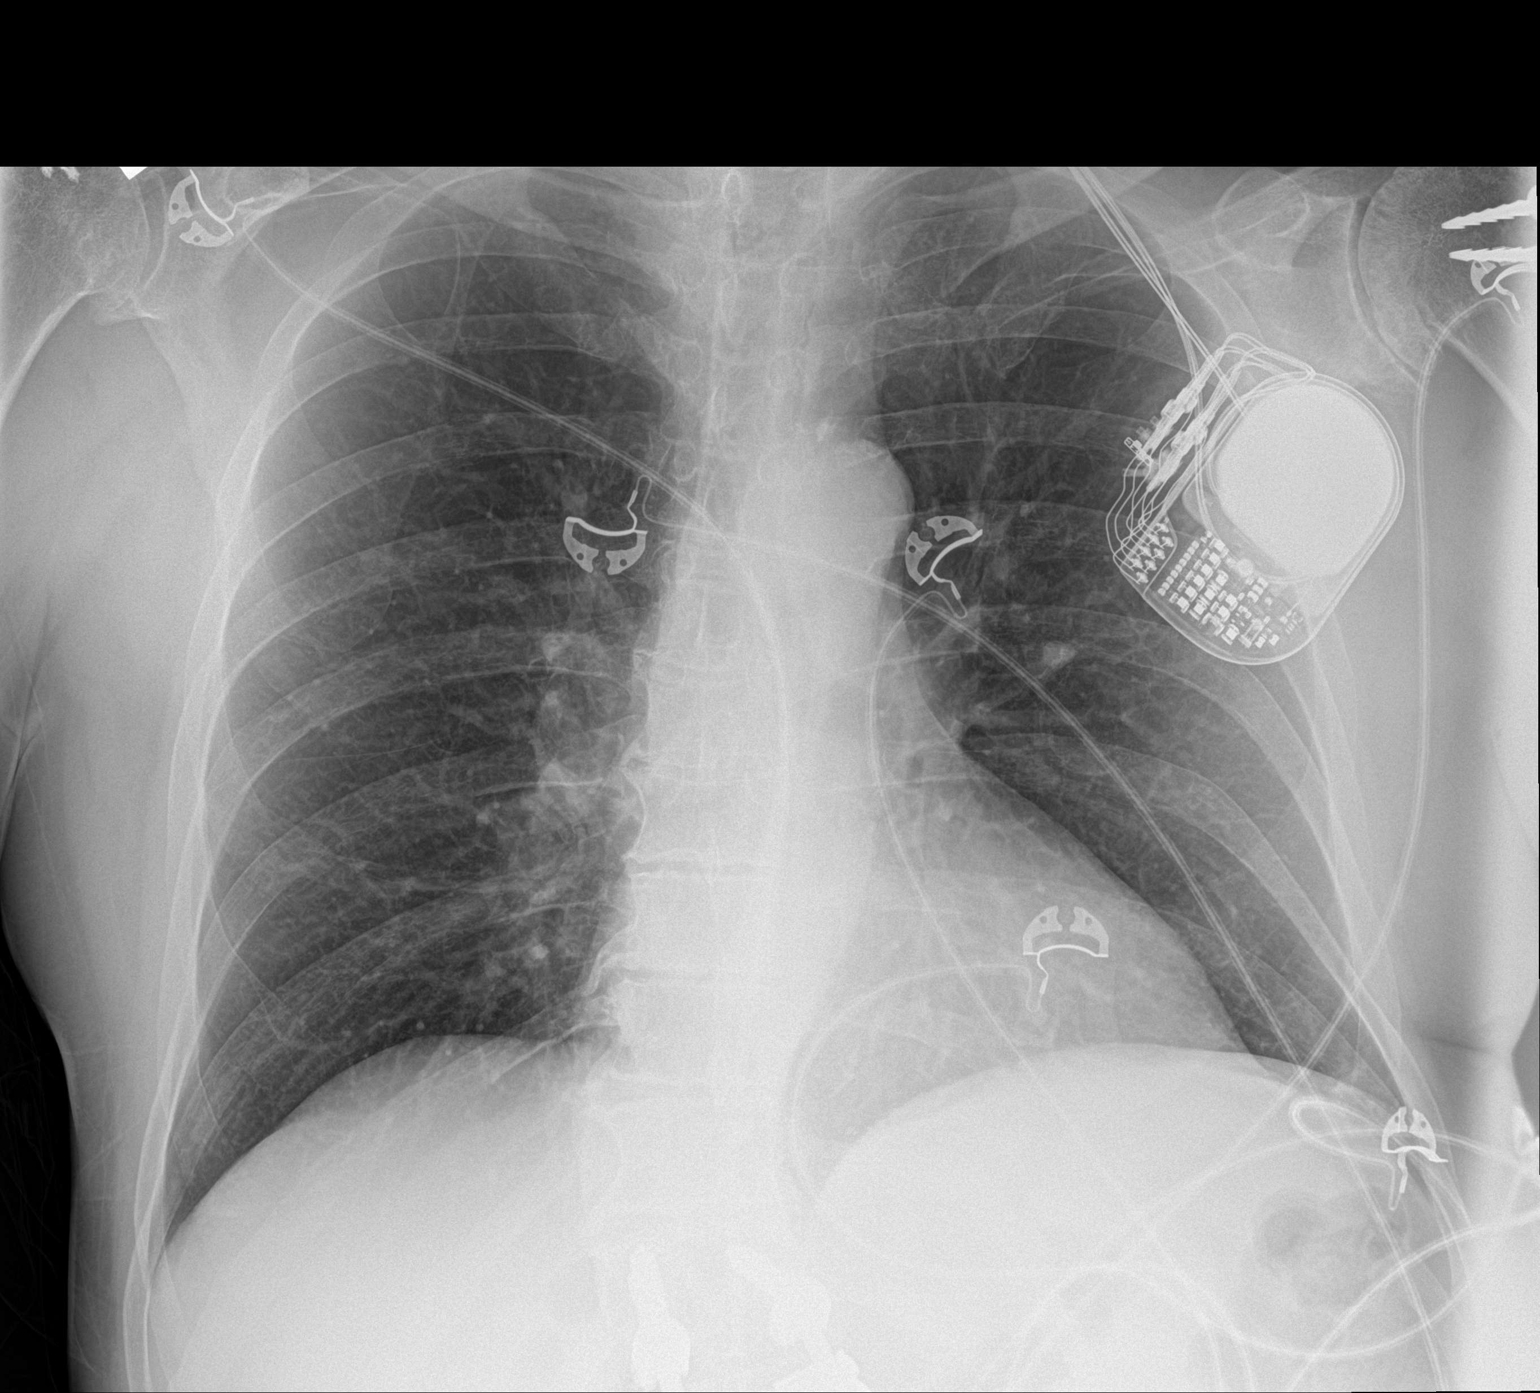

[2 of 2 positions shown; findings below may reference images not displayed]

FINDINGS: Cardiac silhouette is normal in size. No mediastinal or hilar masses
or evidence of adenopathy.

Lungs are clear.  No pleural effusion or pneumothorax.

There changes from previous left shoulder surgery and a lumbar spine
posterior fusion, stable.
IMPRESSION: No acute cardiopulmonary disease.

## 2017-11-13 ENCOUNTER — Encounter: Payer: Self-pay | Admitting: Physical Therapy

## 2017-11-13 ENCOUNTER — Ambulatory Visit: Payer: Medicare Other | Attending: Internal Medicine | Admitting: Physical Therapy

## 2017-11-13 DIAGNOSIS — R262 Difficulty in walking, not elsewhere classified: Secondary | ICD-10-CM | POA: Diagnosis present

## 2017-11-13 DIAGNOSIS — M25561 Pain in right knee: Secondary | ICD-10-CM | POA: Diagnosis present

## 2017-11-13 DIAGNOSIS — G8929 Other chronic pain: Secondary | ICD-10-CM | POA: Insufficient documentation

## 2017-11-13 DIAGNOSIS — M6281 Muscle weakness (generalized): Secondary | ICD-10-CM | POA: Insufficient documentation

## 2017-11-13 NOTE — Therapy (Signed)
[place] MAIN [facility] SERVICES [address] [place], [place], [address] Phone: [phone]   Fax:  [phone]  Physical Therapy Evaluation  Patient Details  Name: Charles Choi MRN: [id number] Date of Birth: Aug 13, 1951 Referring Provider: Fulton Reek D    Encounter Date: 11/13/2017  PT End of Session - 11/13/17 1645    Visit Number  1    Number of Visits  17    Date for PT Re-Evaluation  01/08/18    PT Start Time  0415    PT Stop Time  0515    PT Time Calculation (min)  60 min    Equipment Utilized During Treatment  Gait belt    Activity Tolerance  Patient tolerated treatment well    Behavior During Therapy  [facility] for tasks assessed/performed       Past Medical History:  Diagnosis Date  . Anxiety   . Cancer ([place])   . Depression   . Hypertension   . Parkinson's disease [facility])     Past Surgical History:  Procedure Laterality Date  . BACK SURGERY    . CHOLECYSTECTOMY    . DEEP BRAIN STIMULATOR PLACEMENT     for Parkinson's disease  . JOINT REPLACEMENT     left knee x2    There were no vitals filed for this visit.   Subjective Assessment - 11/13/17 1623    Subjective  Patient has parkinsons disease and was in a wc from 08/2016- 04/04/17 and now he feels that he is not able to stand or walk as long. He is having numbness in the right foot that began after having 2 falls august 2017 and he feels that it is getting worse. and it is turning out.     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  Had PT at [facility] for 7 days.  Used the bike 6-10 minutes, as well as worked on walking, ROM for his R knee.  Started walking with a rw and is currently using a rollator walker.  Pt also adds that he was in a wc since October 2017 until the surgery due to pain. Patient has been on antiobiotic now due to the left knee being infected. The MD will test him in april to see if he continues to have  an infection and if he needs to have any more surgery on the left knee. Patients baseline for ambulation is that he was able to ambulate with a spc outdoors. He had PT in July for strengthening to be able to walk with a cane instead of the rollator.     Limitations  Walking;Standing    Patient Stated Goals  I want to walk without a walker, improve endurance.    Currently in Pain?  Yes    Pain Location  Foot    Pain Orientation  Right    Pain Descriptors / Indicators  Tender;Tingling;Numbness;Shooting;Stabbing    Pain Type  Chronic pain    Pain Onset  More than a month ago    Pain Frequency  Constant    Aggravating Factors   sorness is constant and shooting stabbing is intermittent    Effect of Pain on Daily Activities  harder to do them and slower to perform activities    Multiple Pain Sites  No         [facility] PT Assessment - 11/13/17 1622      Assessment  Medical Diagnosis  ataxia    Referring Provider  SPARKS, JEFFREY D     Onset Date/Surgical Date  10/26/17    Hand Dominance  Right    Next MD Visit  11/14/17    Prior Therapy  Patient had outpatient therapy in july following his knee surgery       Precautions   Precautions  Fall      Restrictions   Weight Bearing Restrictions  No    Other Position/Activity Restrictions  no known weight bearing restrictions      Balance Screen   Has the patient fallen in the past 6 months  Yes    How many times?  4    Has the patient had a decrease in activity level because of a fear of falling?   Yes    Is the patient reluctant to leave their home because of a fear of falling?   Yes      [place] residence    Living Arrangements  Alone    Available Help at Discharge  Family;Friend(s)    Type of [place] to enter    Entrance Stairs-Number of Steps  3    Entrance Stairs-Rails  Right    Home Layout  Multi-level    Alternate Level Stairs-Number of Steps  -- stays on ground  floor    Alternate Level Stairs-Rails  -- does not need to use the other floors    Home Equipment  None;Cane - single point;Shower seat;Grab bars - tub/shower;Grab bars - toilet;Toilet riser;Hand held shower head;Wheelchair - manual    Additional Comments  Pt lives in a 2 story home with family. 4 steps to enter with R rail.  0 steps inside per pt.       Prior Function   Level of Independence  Independent with household mobility with device;Independent with basic ADLs    Vocation  Retired    [employer]  PLOF: pt used a wc since October 2017 due to R knee pain, difficulty walking, standing    Leisure  play with grandson      Cognition   Overall Cognitive Status  Within Functional Limits for tasks assessed         POSTURE: standing knee flex and hip flex   PROM/AROM: B knee lacks approximately 5 degs extension for AROM,  PROM knee extension with overpressure B knees to 0 deg extension   STRENGTH:  Graded on a 0-5 scale Muscle Group Left Right                          Hip Flex 5/5 5/5  Hip Abd 5/5 5/5  Hip Add 4/5 4/5  Hip Ext -4/5 3+/5  Hip IR/ER    Knee Flex 5/5 5/5  Knee Ext 5/5 5/5  Ankle DF 5/5 5/5  Ankle PF * *   * poor quality heel raise x 8 each side with partial heel raise  SENSATION: numbness in right foot   FUNCTIONAL MOBILITY: independent bed mobility sit to supine and supine to sit, sidelying to prone independent Patient transfers sit to stand with rollator and use of UE   BALANCE: Static Standing Balance  Normal Able to maintain standing balance against maximal resistance   Good Able to maintain standing balance against moderate resistance   Good-/Fair+ Able to maintain standing balance against minimal resistance x  Fair  Able to stand unsupported without UE support and without LOB for 1-2 min   Fair- Requires Min A and UE support to maintain standing without loss of balance   Poor+ Requires mod A and UE support to maintain standing  without loss of balance   Poor Requires max A and UE support to maintain standing balance without loss    Standing Dynamic Balance  Normal Stand independently unsupported, able to weight shift and cross midline maximally   Good Stand independently unsupported, able to weight shift and cross midline moderately   Good-/Fair+ Stand independently unsupported, able to weight shift across midline minimally x  Fair Stand independently unsupported, weight shift, and reach ipsilaterally, loss of balance when crossing midline   Poor+ Able to stand with Min A and reach ipsilaterally, unable to weight shift   Poor Able to stand with Mod A and minimally reach ipsilaterally, unable to cross midline.       GAIT:  Patient ambulates independently with rollator , uneven path, limp, slight knee flex and hip flex with weight bearing, decreased gait speed and poor motor control with LE placement.   OUTCOME MEASURES: TEST Outcome Interpretation  5 times sit<>stand 15.76sec >60 yo, >15 sec indicates increased risk for falls  10 meter walk test    .67             m/s <1.0 m/s indicates increased risk for falls; limited community ambulator                         Objective measurements completed on examination: See above findings.              PT Education - 11/13/17 1645    Education provided  Yes    Education Details  plan of care    Person(s) Educated  Patient    Methods  Explanation    Comprehension  Verbalized understanding       PT Short Term Goals - 11/14/17 0848      PT SHORT TERM GOAL #1   Title  Pt will be able to perform sit <> stand transfer from a regular chair independently without UE assist to promote function.     Baseline  needs UE support    Time  4    Period  Weeks    Status  New    Target Date  12/11/17      PT SHORT TERM GOAL #2   Title  Patient will be able to stand at counter for 15 minutes to be able to complete a standing activity    Baseline  Patient  has limited standing time with UE support and AD    Time  4    Period  Weeks    Status  New    Target Date  12/11/17        PT Long Term Goals - 11/14/17 0834      PT LONG TERM GOAL #1   Title  Patient will be independent in home exercise program to improve strength/mobility for better functional independence with ADLs    Baseline  no HEP    Time  8    Period  Weeks    Status  New    Target Date  01/08/18      PT LONG TERM GOAL #2   Title  Patient will ascend/descend 4 stairs with rail assist independently and ability to bring his rollator to the top or bottom of  the steps , without loss of balance to improve ability to get in/out of home    Baseline  Patient reports difficulty with getting his assistive device up and down the steps and has had a fall trying to perform this activity    Time  8    Period  Weeks    Status  New    Target Date  01/08/18      PT LONG TERM GOAL #3   Title  Patient will increase 10 meter walk test to >1.58m/s as to improve gait speed for better community ambulation and to reduce fall risk.    Time  8    Period  Weeks    Status  New    Target Date  01/08/18      PT LONG TERM GOAL #4   Title  Patient (> 41 years old) will complete five times sit to stand test in < 15 seconds indicating an increased LE strength and improved balance.    Time  8    Period  Weeks    Status  New    Target Date  01/08/18      PT LONG TERM GOAL #5   Title  Patient will ambulate 50 feet indoors with spc without falls or using the furniture for support.    Baseline  needs a rollator    Time  8    Period  Weeks    Status  New    Target Date  01/08/18      Additional Long Term Goals   Additional Long Term Goals  Yes             Plan - 11/14/17 0814    Clinical Impression Statement  Patient presents to PT evaluation with e of ataxia and has unsteady gait. He has decreased dynamic and static standing balance that limits his distances for ambulation and his  standing ability. He has decreased flexibiity of hamstrings bilaterally and right foot numbness and pain that also limits his mobility. He has had B TKR this last year and was previously using a wc. He ambulates with a rollator and has hx of 4 falls in the last 6 months. He has decreased 5 x sit to stand and decreased 10 MW that indicate a falls risk. He has a desire to ambulate with a spc and will benefit from skilled PT to improve standing balance and mobility     History and Personal Factors relevant to plan of care:  falls x 4 in  last 6 months, lives alone,     Clinical Presentation  Evolving    Clinical Decision Making  Moderate    Rehab Potential  Good    Clinical Impairments Affecting Rehab Potential  chronicity of condition, Parkinson's disease, TKR BLE    PT Frequency  2x / week    PT Duration  8 weeks 14 weeks    PT Treatment/Interventions  Neuromuscular re-education;Manual techniques;Therapeutic exercise;Therapeutic activities;Aquatic Therapy;Gait training;Patient/family education;Cryotherapy;Stair training;Balance training;Dry needling;Taping    PT Next Visit Plan  Knee extension ROM, gait, functional strengthening, balance training    PT Home Exercise Plan  needs HEP next visit, 6 MW test, TUG test,     Consulted and Agree with Plan of Care  Patient       Patient will benefit from skilled therapeutic intervention in order to improve the following deficits and impairments:  Pain, Postural dysfunction, Improper body mechanics, Difficulty walking, Decreased strength, Decreased range of motion, Abnormal gait, Decreased  balance, Decreased endurance, Decreased mobility, Impaired sensation, Decreased activity tolerance, Impaired flexibility  Visit Diagnosis: Muscle weakness (generalized)  Difficulty in walking, not elsewhere classified     Problem List Patient Active Problem List   Diagnosis Date Noted  . Moderate recurrent major depression ([place]) 08/15/2016  . Chest pain due to  myocardial ischemia 08/14/2016  . Chest pain, rule out acute myocardial infarction 08/14/2016    Alanson Puls, [place] DPT 11/14/2017, 10:15 AM  [place] MAIN [facility] SERVICES [address] [place], [place], [address] Phone: [phone]   Fax:  [phone]  Name: LYNWOOD KUBISIAK MRN: [id number] Date of Birth: Apr 02, 1951

## 2017-11-14 NOTE — Addendum Note (Signed)
Addended by: Alanson Puls on: 11/14/2017 10:21 AM   Modules accepted: Orders

## 2017-11-15 ENCOUNTER — Encounter: Payer: Self-pay | Admitting: Physical Therapy

## 2017-11-15 ENCOUNTER — Ambulatory Visit: Payer: Medicare Other | Admitting: Physical Therapy

## 2017-11-15 DIAGNOSIS — M6281 Muscle weakness (generalized): Secondary | ICD-10-CM

## 2017-11-15 DIAGNOSIS — R262 Difficulty in walking, not elsewhere classified: Secondary | ICD-10-CM

## 2017-11-15 NOTE — Therapy (Signed)
[place] MAIN [facility] SERVICES [address] [place], [place], [address] Phone: [phone]   Fax:  [phone]  Physical Therapy Treatment  Patient Details  Name: Charles Choi MRN: [id number] Date of Birth: [birth year] Referring Provider: Fulton Reek D    Encounter Date: 11/15/2017  PT End of Session - 11/15/17 1628    Visit Number  2    Number of Visits  17    Date for PT Re-Evaluation  01/08/18    PT Start Time  0415    PT Stop Time  0500    PT Time Calculation (min)  45 min    Equipment Utilized During Treatment  Gait belt    Activity Tolerance  Patient tolerated treatment well    Behavior During Therapy  [facility] for tasks assessed/performed       Past Medical History:  Diagnosis Date  . Anxiety   . Cancer ([place])   . Depression   . Hypertension   . Parkinson's disease [facility])     Past Surgical History:  Procedure Laterality Date  . BACK SURGERY    . CHOLECYSTECTOMY    . DEEP BRAIN STIMULATOR PLACEMENT     for Parkinson's disease  . JOINT REPLACEMENT     left knee x2    There were no vitals filed for this visit.  Subjective Assessment - 11/15/17 1624    Subjective  Patient is doing well today with 6 /10 pain to left shoulder 3/10. He went to Dr. sparks yesterday.    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  Had PT at [facility] for 7 days.  Used the bike 6-10 minutes, as well as worked on walking, ROM for his R knee.  Started walking with a rw and is currently using a rollator walker.  Pt also adds that he was in a wc since October 2017 until the surgery due to pain. Patient has been on antiobiotic now due to the left knee being infected. The MD will test him in april to see if he continues to have an infection and if he needs to have any more surgery on the left knee. Patients baseline for ambulation is that he was able to ambulate with a spc outdoors. He had PT in July for  strengthening to be able to walk with a cane instead of the rollator.     Limitations  Walking;Standing    Patient Stated Goals  I want to walk without a walker, improve endurance.    Currently in Pain?  Yes    Pain Score  3     Pain Location  Shoulder    Pain Orientation  Left    Pain Descriptors / Indicators  Aching    Pain Type  Chronic pain    Pain Onset  More than a month ago    Pain Frequency  Constant    Multiple Pain Sites  Yes right foot 2/10      Treatment: Neuromuscular treatment:  Tandem standing and modified tandem stand x 3 reps left fwd, 3 reps right fwd x 30 sec attempts  Side stepping on firm surface x 10 feet x 5 without UE in parallel bars and CGA  Standing on level surface and toe taps to stepping stones 4 across : fwd, diagonal and far left and far right ; needs UE support when lifting his right leg  Stepping over 2 foams 1/2 foam  fwd , min assist and UE support in parallel bars x 6 reps with left knee giving away 1 time  Sit to stand from 18 inch chair with controlled stand and totally straight and descending controlled without UE.x 10 reps  Standing terminal extension with GTB x 5   Eccentric step downs from 2 inch stool with RLE only x 5  Lunge x 3 with left leg giving away  CGA and min assist for all above exercises.                             PT Education - 11/15/17 1627    Education provided  Yes    Education Details  HEP and safety with mobility    Person(s) Educated  Patient    Methods  Explanation    Comprehension  Verbalized understanding       PT Short Term Goals - 11/14/17 0848      PT SHORT TERM GOAL #1   Title  Pt will be able to perform sit <> stand transfer from a regular chair independently without UE assist to promote function.     Baseline  needs UE support    Time  4    Period  Weeks    Status  New    Target Date  12/11/17      PT SHORT TERM GOAL #2   Title  Patient will be able to stand at  counter for 15 minutes to be able to complete a standing activity    Baseline  Patient has limited standing time with UE support and AD    Time  4    Period  Weeks    Status  New    Target Date  12/11/17        PT Long Term Goals - 11/14/17 0834      PT LONG TERM GOAL #1   Title  Patient will be independent in home exercise program to improve strength/mobility for better functional independence with ADLs    Baseline  no HEP    Time  8    Period  Weeks    Status  New    Target Date  01/08/18      PT LONG TERM GOAL #2   Title  Patient will ascend/descend 4 stairs with rail assist independently and ability to bring his rollator to the top or bottom of the steps , without loss of balance to improve ability to get in/out of home    Baseline  Patient reports difficulty with getting his assistive device up and down the steps and has had a fall trying to perform this activity    Time  8    Period  Weeks    Status  New    Target Date  01/08/18      PT LONG TERM GOAL #3   Title  Patient will increase 10 meter walk test to >1.37m/s as to improve gait speed for better community ambulation and to reduce fall risk.    Time  8    Period  Weeks    Status  New    Target Date  01/08/18      PT LONG TERM GOAL #4   Title  Patient (> 66 years old) will complete five times sit to stand test in < 15 seconds indicating an increased LE strength and improved balance.    Time  8    Period  Weeks    Status  New    Target Date  01/08/18      PT LONG TERM GOAL #5   Title  Patient will ambulate 50 feet indoors with spc without falls or using the furniture for support.    Baseline  needs a rollator    Time  8    Period  Weeks    Status  New    Target Date  01/08/18      Additional Long Term Goals   Additional Long Term Goals  Yes            Plan - 11/15/17 1629    Clinical Impression Statement  Paient was challenged with dynamic and static standing activities to improve standing balance  wiht CGA in parallel bars. He has muscular  fatigue with balance challenges and needs ocasional UE support and rest periods throughout. He has difficulty with single leg challenges on firm surfaces and foam. He will continue to benefit from skilled PT to improve strength, balance and mobility.     Rehab Potential  Good    Clinical Impairments Affecting Rehab Potential  chronicity of condition, Parkinson's disease, TKR BLE    PT Frequency  2x / week    PT Duration  8 weeks 14 weeks    PT Treatment/Interventions  Neuromuscular re-education;Manual techniques;Therapeutic exercise;Therapeutic activities;Aquatic Therapy;Gait training;Patient/family education;Cryotherapy;Stair training;Balance training;Dry needling;Taping    PT Next Visit Plan  Knee extension ROM, gait, functional strengthening, balance training    PT Home Exercise Plan  needs HEP next visit, 6 MW test, TUG test,     Consulted and Agree with Plan of Care  Patient       Patient will benefit from skilled therapeutic intervention in order to improve the following deficits and impairments:  Pain, Postural dysfunction, Improper body mechanics, Difficulty walking, Decreased strength, Decreased range of motion, Abnormal gait, Decreased balance, Decreased endurance, Decreased mobility, Impaired sensation, Decreased activity tolerance, Impaired flexibility  Visit Diagnosis: Muscle weakness (generalized)  Difficulty in walking, not elsewhere classified     Problem List Patient Active Problem List   Diagnosis Date Noted  . Moderate recurrent major depression ([place]) 08/15/2016  . Chest pain due to myocardial ischemia 08/14/2016  . Chest pain, rule out acute myocardial infarction 08/14/2016    Alanson Puls, [place] DPT 11/15/2017, 4:34 PM  [place] MAIN [facility] SERVICES [address] [place], [place], [address] Phone: [phone]   Fax:  [phone]  Name: AZRIEL JAKOB MRN: [id number] Date  of Birth: 25-Dec-1950

## 2017-11-20 ENCOUNTER — Encounter: Payer: Self-pay | Admitting: Physical Therapy

## 2017-11-20 ENCOUNTER — Ambulatory Visit: Payer: Medicare Other | Admitting: Physical Therapy

## 2017-11-20 DIAGNOSIS — M6281 Muscle weakness (generalized): Secondary | ICD-10-CM

## 2017-11-20 DIAGNOSIS — R262 Difficulty in walking, not elsewhere classified: Secondary | ICD-10-CM

## 2017-11-20 NOTE — Patient Instructions (Addendum)
Hip: Hike - WB  Small Ball Marching Supine: push low back down to the bed and hold the contraction    Lie supine with small ball under tailbone, knees flexed. Raise one leg a few inches. Hold briefly, return and raise other leg. Keep hips stationary. Do __2_ sets of _15__ repetitions. ------------------------------------------------------------------------------------------------------------------------------------------------------------------------------------------------------------------- SAME POSITION: AS ABOVE:  KNEES TOGETHER AND TIE BAND AROUND KNEES;  PULL KNEES APART ;  KEEP BACK FLAT AGAINST THE BED 20 REPS , SLOWLY  ---------------------------------------------------------------------------------------------------------------------------------------------------------------------------------------------------------------------   SAME POSITION AS ABOVE: ONE KNEE IS FLEXED ONE KNEE IS HELD 6 INCHS ABOVE THE BED RAISED, BEND IT TOWARDS YOUR CHEST AND BACK DOWN AND HOLD OUT STRAIGHT X 10  KEEP YOUR BACK FLAT  Copyright  VHI. All rights reserved.    Copyright  VHI. All rights reserved.  KNEE: Extension - Standing (Band)    Place band around back of knee. Allow knee to bend. Push knee against band to straighten; squeeze glutes. Do not hyperextend or lock knee. Hold __5_ seconds. Use ___GREEN_____ band. _15__ reps per set, _1__ sets per day, __7_ days per week Hold onto a support.  Copyright  VHI. All rights reserved.  Knee-to-Chest: with Neck Flexion Stretch (Supine) ------------------------------------------------------------------------------------------------------------------------------------------------------------------------------------------------------------------------------------------------------------   Pull left knee to chest, tucking chin and lifting head. Hold _30___ seconds. Relax. Repeat ___3_ times per set. Do _1___ sets per session. Do __1__  sessions per day.  http://orth.exer.us/254   Copyright  VHI. All rights reserved.

## 2017-11-20 NOTE — Therapy (Signed)
[place] MAIN [facility] SERVICES [address] [place], [place], [address] Phone: [phone]   Fax:  [phone]  Physical Therapy Treatment  Patient Details  Name: Charles Choi MRN: [id number] Date of Birth: [birth year] Referring Provider: Fulton Reek D    Encounter Date: 11/20/2017  PT End of Session - 11/20/17 1642    Visit Number  3    Number of Visits  17    Date for PT Re-Evaluation  01/08/18    Authorization Time Period  of 10 g-code    PT Start Time  0415    PT Stop Time  0500    PT Time Calculation (min)  45 min    Equipment Utilized During Treatment  Gait belt    Activity Tolerance  Patient limited by pain    Behavior During Therapy  [facility] for tasks assessed/performed       Past Medical History:  Diagnosis Date  . Anxiety   . Cancer ([place])   . Depression   . Hypertension   . Parkinson's disease [facility])     Past Surgical History:  Procedure Laterality Date  . BACK SURGERY    . CHOLECYSTECTOMY    . DEEP BRAIN STIMULATOR PLACEMENT     for Parkinson's disease  . JOINT REPLACEMENT     left knee x2    There were no vitals filed for this visit.  Subjective Assessment - 11/20/17 1634    Subjective  Patient is doing well today with no increased pain after his last treatment.  Per MD report "lumbar CT myelogram we will also obtain CT without contrast to evaluate fusion status of prior surgery, after his antiobiotic therapy is over.     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  Had PT at [facility] for 7 days.  Used the bike 6-10 minutes, as well as worked on walking, ROM for his R knee.  Started walking with a rw and is currently using a rollator walker.  Pt also adds that he was in a wc since October 2017 until the surgery due to pain. Patient has been on antiobiotic now due to the left knee being infected. The MD will test him in april to see if he continues to have an  infection and if he needs to have any more surgery on the left knee. Patients baseline for ambulation is that he was able to ambulate with a spc outdoors. He had PT in July for strengthening to be able to walk with a cane instead of the rollator.     Limitations  Walking;Standing    Patient Stated Goals  I want to walk without a walker, improve endurance.    Currently in Pain?  Yes    Pain Score  2     Pain Location  Shoulder    Pain Orientation  Left    Pain Descriptors / Indicators  Aching    Pain Type  Chronic pain    Pain Onset  More than a month ago    Pain Frequency  Constant    Aggravating Factors   sorness is constant and shooting and stabbing is inntemittent    Multiple Pain Sites  Yes right foot sore 2/10      Treatment: Nu-step for warm up x 5 mins Standing terminal knee extension BLE x 10 with jGTB Performed stepping backwards with WB shift to LLE and patient had increased LBP  and fatigue Performed lumbar stabilization including hooklying marching with TA, hip ER/abd with TA, heel slides and TA x 10 Performed knee flex stretch x 30 sec x 3 BLE Reviewed HEP Patient needs cues for TA, correct position and reviewed with written HEP                       PT Education - 11/20/17 1638    Education provided  Yes    Education Details  HEP review    Person(s) Educated  Patient    Methods  Explanation    Comprehension  Verbalized understanding       PT Short Term Goals - 11/14/17 0848      PT SHORT TERM GOAL #1   Title  Pt will be able to perform sit <> stand transfer from a regular chair independently without UE assist to promote function.     Baseline  needs UE support    Time  4    Period  Weeks    Status  New    Target Date  12/11/17      PT SHORT TERM GOAL #2   Title  Patient will be able to stand at counter for 15 minutes to be able to complete a standing activity    Baseline  Patient has limited standing time with UE support and AD    Time   4    Period  Weeks    Status  New    Target Date  12/11/17        PT Long Term Goals - 11/14/17 0834      PT LONG TERM GOAL #1   Title  Patient will be independent in home exercise program to improve strength/mobility for better functional independence with ADLs    Baseline  no HEP    Time  8    Period  Weeks    Status  New    Target Date  01/08/18      PT LONG TERM GOAL #2   Title  Patient will ascend/descend 4 stairs with rail assist independently and ability to bring his rollator to the top or bottom of the steps , without loss of balance to improve ability to get in/out of home    Baseline  Patient reports difficulty with getting his assistive device up and down the steps and has had a fall trying to perform this activity    Time  8    Period  Weeks    Status  New    Target Date  01/08/18      PT LONG TERM GOAL #3   Title  Patient will increase 10 meter walk test to >1.4m/s as to improve gait speed for better community ambulation and to reduce fall risk.    Time  8    Period  Weeks    Status  New    Target Date  01/08/18      PT LONG TERM GOAL #4   Title  Patient (> 62 years old) will complete five times sit to stand test in < 15 seconds indicating an increased LE strength and improved balance.    Time  8    Period  Weeks    Status  New    Target Date  01/08/18      PT LONG TERM GOAL #5   Title  Patient will ambulate 50 feet indoors with spc without falls or using the furniture for support.  Baseline  needs a rollator    Time  8    Period  Weeks    Status  New    Target Date  01/08/18      Additional Long Term Goals   Additional Long Term Goals  Yes            Plan - 11/20/17 1700    Clinical Impression Statement  Patient performs standing bilateral terminal extension with green theraband and began to work on dynamic standing balance in standing. Patient was not able to tolerate stepping backwards in parallel bars and had increased pain from 0/10 t 4  /10.  Patient performed lumbar stabilization exercises and lumbar stretching with single leg to chest stretch.  Patient will continue to benefit from skilled PT to improve strength, balance and gait to improve quality of life.   Clinical Impairments Affecting Rehab Potential  chronicity of condition, Parkinson's disease, TKR BLE    PT Frequency  2x / week    PT Duration  8 weeks    PT Treatment/Interventions  Neuromuscular re-education;Manual techniques;Therapeutic exercise;Therapeutic activities;Aquatic Therapy;Gait training;Patient/family education;Cryotherapy;Stair training;Balance training;Dry needling;Taping    PT Next Visit Plan  Knee extension ROM, gait, functional strengthening, balance training    PT Home Exercise Plan  needs HEP next visit, 6 MW test, TUG test,     Consulted and Agree with Plan of Care  Patient       Patient will benefit from skilled therapeutic intervention in order to improve the following deficits and impairments:  Pain, Postural dysfunction, Improper body mechanics, Difficulty walking, Decreased strength, Decreased range of motion, Abnormal gait, Decreased balance, Decreased endurance, Decreased mobility, Impaired sensation, Decreased activity tolerance, Impaired flexibility  Visit Diagnosis: Muscle weakness (generalized)  Difficulty in walking, not elsewhere classified     Problem List Patient Active Problem List   Diagnosis Date Noted  . Moderate recurrent major depression ([place]) 08/15/2016  . Chest pain due to myocardial ischemia 08/14/2016  . Chest pain, rule out acute myocardial infarction 08/14/2016    Alanson Puls, [place] DPT 11/20/2017, 5:42 PM  [place] MAIN [facility] SERVICES [address] [place], [place], [address] Phone: [phone]   Fax:  [phone]  Name: TAUREAN JU MRN: [id number] Date of Birth: 1951-04-07

## 2017-11-22 ENCOUNTER — Ambulatory Visit: Payer: Medicare Other | Admitting: Physical Therapy

## 2017-11-22 ENCOUNTER — Encounter: Payer: Self-pay | Admitting: Physical Therapy

## 2017-11-22 DIAGNOSIS — M6281 Muscle weakness (generalized): Secondary | ICD-10-CM | POA: Diagnosis not present

## 2017-11-22 DIAGNOSIS — R262 Difficulty in walking, not elsewhere classified: Secondary | ICD-10-CM

## 2017-11-22 NOTE — Therapy (Signed)
[place] MAIN [facility] SERVICES [address] [place], [place], [address] Phone: [phone]   Fax:  [phone]  Physical Therapy Treatment  Patient Details  Name: Charles Choi MRN: [id number] Date of Birth: 02-28-1951 Referring Provider: Fulton Reek D    Encounter Date: 11/22/2017  PT End of Session - 11/22/17 1609    Visit Number  4    Number of Visits  17    Date for PT Re-Evaluation  01/08/18    Authorization Time Period  of 66 g-code    PT Start Time  0405    PT Stop Time  0445    PT Time Calculation (min)  40 min    Equipment Utilized During Treatment  Gait belt    Activity Tolerance  Patient limited by pain    Behavior During Therapy  [facility] for tasks assessed/performed       Past Medical History:  Diagnosis Date  . Anxiety   . Cancer ([place])   . Depression   . Hypertension   . Parkinson's disease [facility])     Past Surgical History:  Procedure Laterality Date  . BACK SURGERY    . CHOLECYSTECTOMY    . DEEP BRAIN STIMULATOR PLACEMENT     for Parkinson's disease  . JOINT REPLACEMENT     left knee x2    There were no vitals filed for this visit.  Subjective Assessment - 11/22/17 1606    Subjective  Patient is doing well and his back is tired and 2/10 pain.     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  Had PT at [facility] for 7 days.  Used the bike 6-10 minutes, as well as worked on walking, ROM for his R knee.  Started walking with a rw and is currently using a rollator walker.  Pt also adds that he was in a wc since October 2017 until the surgery due to pain. Patient has been on antiobiotic now due to the left knee being infected. The MD will test him in april to see if he continues to have an infection and if he needs to have any more surgery on the left knee. Patients baseline for ambulation is that he was able to ambulate with a spc outdoors. He had PT in July for  strengthening to be able to walk with a cane instead of the rollator.     Limitations  Walking;Standing    Patient Stated Goals  I want to walk without a walker, improve endurance.    Currently in Pain?  Yes    Pain Score  2     Pain Location  Back    Pain Orientation  Lower    Pain Descriptors / Indicators  Aching    Pain Type  Chronic pain    Pain Onset  More than a month ago    Pain Frequency  Constant    Aggravating Factors   walking    Pain Relieving Factors  sitting    Effect of Pain on Daily Activities  difficult to perform activities.    Multiple Pain Sites  No       Treatment : Warm up ( nu step x 5 )   Lunges into BOSU ball BLE x 10 with cues to keep back foot straight  Step ups to 6 inch stool x 10 BLE   Step fwd x 10 BLE no UE support in  parallel bars; needs UE support with RLE fwd step ; patients LLE begins to give away  SLR RLE x 10 x 2,cues for correct technique  SLR with ER and intrevals x 2 RLE x 10 ; cues for correct technique  SLR LLE stopped because of knee flex  Left knee quad set with 3- 5 sec hold   Bridging x 10 with cramp in R hamstring  sidelying left and right x 10 ; cues to roll fwd more to get into correct position  sidelying clam left and right x 10   sidelying hip fex/ext left and right x 10  Patient has increased weakness in LLE hip abd and LLE knee extension; he is unable to perform a SAQ                         PT Education - 11/22/17 1609    Education provided  Yes    Education Details  HEP    Person(s) Educated  Patient    Methods  Explanation    Comprehension  Verbalized understanding       PT Short Term Goals - 11/14/17 0848      PT SHORT TERM GOAL #1   Title  Pt will be able to perform sit <> stand transfer from a regular chair independently without UE assist to promote function.     Baseline  needs UE support    Time  4    Period  Weeks    Status  New    Target Date  12/11/17      PT SHORT  TERM GOAL #2   Title  Patient will be able to stand at counter for 66 minutes to be able to complete a standing activity    Baseline  Patient has limited standing time with UE support and Charles    Time  4    Period  Weeks    Status  New    Target Date  12/11/17        PT Long Term Goals - 11/14/17 0834      PT LONG TERM GOAL #1   Title  Patient will be independent in home exercise program to improve strength/mobility for better functional independence with ADLs    Baseline  no HEP    Time  8    Period  Weeks    Status  New    Target Date  01/08/18      PT LONG TERM GOAL #2   Title  Patient will ascend/descend 4 stairs with rail assist independently and ability to bring his rollator to the top or bottom of the steps , without loss of balance to improve ability to get in/out of home    Baseline  Patient reports difficulty with getting his assistive device up and down the steps and has had a fall trying to perform this activity    Time  8    Period  Weeks    Status  New    Target Date  01/08/18      PT LONG TERM GOAL #3   Title  Patient will increase 10 meter walk test to >1.45m/s as to improve gait speed for better community ambulation and to reduce fall risk.    Time  8    Period  Weeks    Status  New    Target Date  01/08/18      PT LONG TERM GOAL #4   Title  Patient (> 66 years old) will complete five times sit to stand test in < 15 seconds indicating an increased LE strength and improved balance.    Time  8    Period  Weeks    Status  New    Target Date  01/08/18      PT LONG TERM GOAL #5   Title  Patient will ambulate 50 feet indoors with spc without falls or using the furniture for support.    Baseline  needs a rollator    Time  8    Period  Weeks    Status  New    Target Date  01/08/18      Additional Long Term Goals   Additional Long Term Goals  Yes            Plan - 11/22/17 1644    Clinical Impression Statement  Patient was seen today for  strengthening his LE's, core wiht standing and supine exercises in open and closed chain. He lacks L knee terminal extension and his leg givew away wiht fatigue doing step up exercises in standing and fwd stepping in standing. He was encouraged to start doing his HEP , which he has not done yet, and was educated that he wont get stronger if he does not do his HEP. He was given additional exericses in a written handout. He will conitnue to benefit from skilled PT to improve mobility and  strength.     Clinical Impairments Affecting Rehab Potential  chronicity of condition, Parkinson's disease, TKR BLE    PT Frequency  2x / week    PT Duration  8 weeks    PT Treatment/Interventions  Neuromuscular re-education;Manual techniques;Therapeutic exercise;Therapeutic activities;Aquatic Therapy;Gait training;Patient/family education;Cryotherapy;Stair training;Balance training;Dry needling;Taping    PT Next Visit Plan  Knee extension ROM, gait, functional strengthening, balance training    PT Home Exercise Plan  needs HEP next visit, 6 MW test, TUG test,     Consulted and Agree with Plan of Care  Patient       Patient will benefit from skilled therapeutic intervention in order to improve the following deficits and impairments:  Pain, Postural dysfunction, Improper body mechanics, Difficulty walking, Decreased strength, Decreased range of motion, Abnormal gait, Decreased balance, Decreased endurance, Decreased mobility, Impaired sensation, Decreased activity tolerance, Impaired flexibility  Visit Diagnosis: Muscle weakness (generalized)  Difficulty in walking, not elsewhere classified     Problem List Patient Active Problem List   Diagnosis Date Noted  . Moderate recurrent major depression ([place]) 08/15/2016  . Chest pain due to myocardial ischemia 08/14/2016  . Chest pain, rule out acute myocardial infarction 08/14/2016    Alanson Puls , [place] DPT 11/22/2017, 4:48 PM  [place] MAIN [facility] SERVICES [address] [place], [place], [address] Phone: [phone]   Fax:  [phone]  Name: Charles Choi MRN: [id number] Date of Birth: [birth year]

## 2017-11-22 NOTE — Patient Instructions (Signed)
HIP: Extension, Bridging Bilateral    Place feet on surface. Tighten glutes, raise hips up. ___ reps per set, ___ sets per day, ___ days per week   Copyright  VHI. All rights reserved.  Hip Abduction / Adduction: with Extended Knee (Supine)    Bring left leg out to side and return. Keep knee straight. Repeat ____ times per set. Do ____ sets per session. Do ____ sessions per day.  http://orth.exer.us/680   Copyright  VHI. All rights reserved.

## 2017-11-27 ENCOUNTER — Ambulatory Visit: Payer: Medicare Other | Admitting: Physical Therapy

## 2017-11-27 ENCOUNTER — Encounter: Payer: Self-pay | Admitting: Physical Therapy

## 2017-11-27 DIAGNOSIS — M6281 Muscle weakness (generalized): Secondary | ICD-10-CM

## 2017-11-27 DIAGNOSIS — R262 Difficulty in walking, not elsewhere classified: Secondary | ICD-10-CM

## 2017-11-27 DIAGNOSIS — G8929 Other chronic pain: Secondary | ICD-10-CM

## 2017-11-27 DIAGNOSIS — M25561 Pain in right knee: Secondary | ICD-10-CM

## 2017-11-27 NOTE — Therapy (Signed)
[place] MAIN [facility] SERVICES [address] [place], [place], [address] Phone: [phone]   Fax:  [phone]  Physical Therapy Treatment  Patient Details  Name: Charles Choi MRN: [id number] Date of Birth: 24-Dec-1950 Referring Provider: Fulton Reek D    Encounter Date: 11/27/2017  PT End of Session - 11/27/17 1624    Visit Number  5    Number of Visits  17    Date for PT Re-Evaluation  01/08/18    Authorization Time Period  of 10 g-code    PT Start Time  0415    PT Stop Time  0500    PT Time Calculation (min)  45 min    Equipment Utilized During Treatment  Gait belt    Activity Tolerance  Patient limited by pain    Behavior During Therapy  [facility] for tasks assessed/performed       Past Medical History:  Diagnosis Date  . Anxiety   . Cancer ([place])   . Depression   . Hypertension   . Parkinson's disease [facility])     Past Surgical History:  Procedure Laterality Date  . BACK SURGERY    . CHOLECYSTECTOMY    . DEEP BRAIN STIMULATOR PLACEMENT     for Parkinson's disease  . JOINT REPLACEMENT     left knee x2    There were no vitals filed for this visit.  Subjective Assessment - 11/27/17 1621    Subjective  Patient reports that he is doing well. He is having intermittent right foot pain in his arch but currently it is not hurting. His left shoulder is also hurting today 5/10.  He has back pain that is intermittent and not bothering him today; he also has right hand numbness that is intermittent and right calf pain from a muscle spasm.    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  Had PT at [facility] for 7 days.  Used the bike 6-10 minutes, as well as worked on walking, ROM for his R knee.  Started walking with a rw and is currently using a rollator walker.  Pt also adds that he was in a wc since October 2017 until the surgery due to pain. Patient has been on antiobiotic now due to  the left knee being infected. The MD will test him in april to see if he continues to have an infection and if he needs to have any more surgery on the left knee. Patients baseline for ambulation is that he was able to ambulate with a spc outdoors. He had PT in July for strengthening to be able to walk with a cane instead of the rollator.     Limitations  Walking;Standing    Patient Stated Goals  I want to walk without a walker, improve endurance.    Currently in Pain?  Yes    Pain Score  5     Pain Location  Shoulder    Pain Orientation  Left    Pain Descriptors / Indicators  Aching    Pain Type  Chronic pain    Pain Onset  More than a month ago    Pain Frequency  Constant    Aggravating Factors   using his arm    Pain Relieving Factors  not using his arm    Effect of Pain on Daily Activities  difficult to use his arm    Multiple Pain Sites  --  intermittent right arch foot pain      Therapeutic exercise;  Patient performs tandem step position and rocking position with BUE alternating arm swing; patient needs cues to have full weight shifting over each leg fwd and back, VC to swing UE but is not able to coordinate his UE with his weight shifting. Patient needs education to break down the exercise into parts, 1. Using BUE on parallel bars shift weight fwd and back, then use one UE for support , then try no UE support with right leg in front  Stepping fwd and stepping backward with the right foot with BUE support x 10   Stepping fwd and stepping back ward with the left foot with BUe support x 10  Stepping side ways left x  10 , side stepping side ways right x 10 , UE support   Stepping backward and then back to beginning position left foot x 10 , right foot x 10 with UE support; patient has fatigue in B hips and needs to rest  Supine bridging x 10 x 2  sidelying hip abd x 10 x 2 BLE  Right knee extension stretch x 30 sec x 2 in supine; patient reports pain in hamstring and right  calf  Manual therapy:  STM with Roller stick to right calf x 8 mins to decreased spasm in right calf  Patient has cramps in right hip adductor muscles with bridging, decreased strength in B hip abductors , decreased RLE knee extension in standing and supine.                            PT Education - 11/27/17 1624    Education provided  Yes    Education Details  HEP    Person(s) Educated  Patient    Methods  Explanation    Comprehension  Verbalized understanding       PT Short Term Goals - 11/14/17 0848      PT SHORT TERM GOAL #1   Title  Pt will be able to perform sit <> stand transfer from a regular chair independently without UE assist to promote function.     Baseline  needs UE support    Time  4    Period  Weeks    Status  New    Target Date  12/11/17      PT SHORT TERM GOAL #2   Title  Patient will be able to stand at counter for 15 minutes to be able to complete a standing activity    Baseline  Patient has limited standing time with UE support and AD    Time  4    Period  Weeks    Status  New    Target Date  12/11/17        PT Long Term Goals - 11/14/17 0834      PT LONG TERM GOAL #1   Title  Patient will be independent in home exercise program to improve strength/mobility for better functional independence with ADLs    Baseline  no HEP    Time  8    Period  Weeks    Status  New    Target Date  01/08/18      PT LONG TERM GOAL #2   Title  Patient will ascend/descend 4 stairs with rail assist independently and ability to bring his rollator to the top or bottom of the steps , without loss of balance  to improve ability to get in/out of home    Baseline  Patient reports difficulty with getting his assistive device up and down the steps and has had a fall trying to perform this activity    Time  8    Period  Weeks    Status  New    Target Date  01/08/18      PT LONG TERM GOAL #3   Title  Patient will increase 10 meter walk test to  >1.41m/s as to improve gait speed for better community ambulation and to reduce fall risk.    Time  8    Period  Weeks    Status  New    Target Date  01/08/18      PT LONG TERM GOAL #4   Title  Patient (> 66 years old) will complete five times sit to stand test in < 15 seconds indicating an increased LE strength and improved balance.    Time  8    Period  Weeks    Status  New    Target Date  01/08/18      PT LONG TERM GOAL #5   Title  Patient will ambulate 50 feet indoors with spc without falls or using the furniture for support.    Baseline  needs a rollator    Time  8    Period  Weeks    Status  New    Target Date  01/08/18      Additional Long Term Goals   Additional Long Term Goals  Yes            Plan - 11/27/17 1625    Clinical Impression Statement  Patient has pain in left shoulder and right foot arch, back that is intermittent and interferes with his ability to participate in exercises. He is not consistent with his HEP and this was discussed and patient was educated about progress being limited if he is not able to be consistent with HEP.  He is having a muscle spasm in right calf and will benefit from dry needling.Marland Kitchen He is able to perform standing exercises and supine exercises with fatigue in his hips.  He performs beginning level closed chain standing exercises and is self limiting due to several comorbidities including: back, left shoulder, right foot arch, right calf, right hand numbness.  He will continue to benefit from skilled PT to improve strength and balance and mobility.      Rehab Potential  Good    Clinical Impairments Affecting Rehab Potential  chronicity of condition, Parkinson's disease, TKR BLE    PT Frequency  2x / week    PT Duration  8 weeks    PT Treatment/Interventions  Neuromuscular re-education;Manual techniques;Therapeutic exercise;Therapeutic activities;Aquatic Therapy;Gait training;Patient/family education;Cryotherapy;Stair training;Balance  training;Dry needling;Taping    PT Next Visit Plan  Knee extension ROM, gait, functional strengthening, balance training    PT Home Exercise Plan  needs HEP next visit, 6 MW test, TUG test,     Consulted and Agree with Plan of Care  Patient       Patient will benefit from skilled therapeutic intervention in order to improve the following deficits and impairments:  Pain, Postural dysfunction, Improper body mechanics, Difficulty walking, Decreased strength, Decreased range of motion, Abnormal gait, Decreased balance, Decreased endurance, Decreased mobility, Impaired sensation, Decreased activity tolerance, Impaired flexibility  Visit Diagnosis: Muscle weakness (generalized)  Difficulty in walking, not elsewhere classified  Chronic pain of right knee     Problem  List Patient Active Problem List   Diagnosis Date Noted  . Moderate recurrent major depression ([place]) 08/15/2016  . Chest pain due to myocardial ischemia 08/14/2016  . Chest pain, rule out acute myocardial infarction 08/14/2016    Alanson Puls, [place] DPT 11/27/2017, 5:25 PM  [place] MAIN [facility] SERVICES [address] [place], [place], [address] Phone: [phone]   Fax:  [phone]  Name: Charles Choi MRN: [id number] Date of Birth: 11/16/1950

## 2017-11-29 ENCOUNTER — Ambulatory Visit: Payer: 59 | Admitting: Physical Therapy

## 2017-11-30 ENCOUNTER — Encounter: Payer: Medicare Other | Admitting: Physical Therapy

## 2017-12-04 ENCOUNTER — Encounter: Payer: Self-pay | Admitting: Physical Therapy

## 2017-12-04 ENCOUNTER — Ambulatory Visit: Payer: Medicare Other | Admitting: Physical Therapy

## 2017-12-04 DIAGNOSIS — M6281 Muscle weakness (generalized): Secondary | ICD-10-CM | POA: Diagnosis not present

## 2017-12-04 DIAGNOSIS — M25561 Pain in right knee: Secondary | ICD-10-CM

## 2017-12-04 DIAGNOSIS — R262 Difficulty in walking, not elsewhere classified: Secondary | ICD-10-CM

## 2017-12-04 DIAGNOSIS — G8929 Other chronic pain: Secondary | ICD-10-CM

## 2017-12-04 NOTE — Therapy (Signed)
[place] MAIN [facility] SERVICES [address] [place], [place], [address] Phone: [phone]   Fax:  [phone]  Physical Therapy Treatment  Patient Details  Name: Charles Choi MRN: [id number] Date of Birth: 08-21-1951 Referring Provider: Fulton Reek D    Encounter Date: 12/04/2017  PT End of Session - 12/04/17 1626    Visit Number  6    Number of Visits  17    Date for PT Re-Evaluation  01/08/18    Authorization Time Period  of 10 g-code    PT Start Time  0415    PT Stop Time  0500    PT Time Calculation (min)  45 min    Equipment Utilized During Treatment  Gait belt    Activity Tolerance  Patient limited by pain    Behavior During Therapy  [facility] for tasks assessed/performed       Past Medical History:  Diagnosis Date  . Anxiety   . Cancer ([place])   . Depression   . Hypertension   . Parkinson's disease [facility])     Past Surgical History:  Procedure Laterality Date  . BACK SURGERY    . CHOLECYSTECTOMY    . DEEP BRAIN STIMULATOR PLACEMENT     for Parkinson's disease  . JOINT REPLACEMENT     left knee x2    There were no vitals filed for this visit.  Subjective Assessment - 12/04/17 1622    Subjective  Patient continues to have right  calf spasms that are not getting better. He continues to have right foot pain in the arch and left shoulder pain today.    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  Had PT at [facility] for 7 days.  Used the bike 6-10 minutes, as well as worked on walking, ROM for his R knee.  Started walking with a rw and is currently using a rollator walker.  Pt also adds that he was in a wc since October 2017 until the surgery due to pain. Patient has been on antiobiotic now due to the left knee being infected. The MD will test him in april to see if he continues to have an infection and if he needs to have any more surgery on the left knee. Patients baseline  for ambulation is that he was able to ambulate with a spc outdoors. He had PT in July for strengthening to be able to walk with a cane instead of the rollator.     Limitations  Walking;Standing    Patient Stated Goals  I want to walk without a walker, improve endurance.    Currently in Pain?  Yes    Pain Score  1     Pain Location  Calf    Pain Orientation  Right    Pain Descriptors / Indicators  Aching    Pain Type  Acute pain    Pain Onset  More than a month ago    Multiple Pain Sites  -- right foot , left shoulder 1/10       Therapeutic exercise;  Patient performs tandem step position and rocking position with BUE alternating arm swing; patient needs cues to have full weight shifting over each leg fwd and back, VC to swing UE but is not able to coordinate his UE with his weight shifting. Patient needs education to break down the exercise into parts, 1. Using BUE on parallel bars shift  weight fwd and back, then use one UE for support , then try no UE support with right leg in front  Stepping fwd and stepping backward with the right foot with BUE support x 10 ; VC for posture correction and foot placement  Stepping fwd and stepping back ward with the left foot with BUE support x 10, VC for posture correction and foot placement  Stepping side ways left x  10 , side stepping side ways right x 10 , UE support vc for bigger foot step   Stepping backward and then back to beginning position left foot x 10 , right foot x 10 with UE support; patient has fatigue in B hips and needs to rest  Side stepping to 6 inch stool x 10 left and right with cues for posture correction and not using his UE and using his LE's more  Supine bridging x 10 x 2, cues for pushing with feet and raising his hips  Supine RLE straight and 6 inches off the mat, bridging x 10 ( unable to perform with LLE straight due to right hamstring spasm)  Supine BLE , SLR x 10 with cues to keep his knee in extension    sidelying hip abd x 10 x 2 BLE, cues to roll  Trunk fwd, extend his leg backward, keep his knee straight   Supine hip abd  X 10 with cues to keep his toes pointing toward the ceiling and not ER  Right knee extension stretch x 30 sec x 2 in supine; patient reports pain in hamstring and right calf  Patient is able to weight shift and weight bear on right leg longer than on left leg. He needs to jump back fast when moving right leg. He needs rest periods when doing standing exercises.  He has fatigue with standing exercises and is recommended to do HEP of supine and sitting exercises.                      PT Education - 12/04/17 1625    Education provided  Yes    Education Details  HEP     Person(s) Educated  Patient    Methods  Explanation    Comprehension  Verbalized understanding       PT Short Term Goals - 11/14/17 0848      PT SHORT TERM GOAL #1   Title  Pt will be able to perform sit <> stand transfer from a regular chair independently without UE assist to promote function.     Baseline  needs UE support    Time  4    Period  Weeks    Status  New    Target Date  12/11/17      PT SHORT TERM GOAL #2   Title  Patient will be able to stand at counter for 15 minutes to be able to complete a standing activity    Baseline  Patient has limited standing time with UE support and AD    Time  4    Period  Weeks    Status  New    Target Date  12/11/17        PT Long Term Goals - 11/14/17 0834      PT LONG TERM GOAL #1   Title  Patient will be independent in home exercise program to improve strength/mobility for better functional independence with ADLs    Baseline  no HEP    Time  8  Period  Weeks    Status  New    Target Date  01/08/18      PT LONG TERM GOAL #2   Title  Patient will ascend/descend 4 stairs with rail assist independently and ability to bring his rollator to the top or bottom of the steps , without loss of balance to improve ability to  get in/out of home    Baseline  Patient reports difficulty with getting his assistive device up and down the steps and has had a fall trying to perform this activity    Time  8    Period  Weeks    Status  New    Target Date  01/08/18      PT LONG TERM GOAL #3   Title  Patient will increase 10 meter walk test to >1.59m/s as to improve gait speed for better community ambulation and to reduce fall risk.    Time  8    Period  Weeks    Status  New    Target Date  01/08/18      PT LONG TERM GOAL #4   Title  Patient (> 74 years old) will complete five times sit to stand test in < 15 seconds indicating an increased LE strength and improved balance.    Time  8    Period  Weeks    Status  New    Target Date  01/08/18      PT LONG TERM GOAL #5   Title  Patient will ambulate 50 feet indoors with spc without falls or using the furniture for support.    Baseline  needs a rollator    Time  8    Period  Weeks    Status  New    Target Date  01/08/18      Additional Long Term Goals   Additional Long Term Goals  Yes            Plan - 12/04/17 1627    Clinical Impression Statement  Patient has pain in left shoulder and right foot arch, , right calf, and back that is intermittent and interferes with his ability to participate in exercises. He is doing some of his HEPHe is having a muscle spasm in right calf and will benefit from dry needling. He is able to perform standing exercises and supine exercises with fatigue in his hips. He performs beginning level closed chain standing exercises and is self limiting due to several comorbidities including: back, left shoulder, right foot arch, right calf, right hand numbness. He will continue to benefit from skilled PT to improve strength and balance and mobility    Rehab Potential  Good    Clinical Impairments Affecting Rehab Potential  chronicity of condition, Parkinson's disease, TKR BLE    PT Frequency  2x / week    PT Duration  8 weeks    PT  Treatment/Interventions  Neuromuscular re-education;Manual techniques;Therapeutic exercise;Therapeutic activities;Aquatic Therapy;Gait training;Patient/family education;Cryotherapy;Stair training;Balance training;Dry needling;Taping    PT Next Visit Plan  Knee extension ROM, gait, functional strengthening, balance training    PT Home Exercise Plan  needs HEP next visit, 6 MW test, TUG test,     Consulted and Agree with Plan of Care  Patient       Patient will benefit from skilled therapeutic intervention in order to improve the following deficits and impairments:  Pain, Postural dysfunction, Improper body mechanics, Difficulty walking, Decreased strength, Decreased range of motion, Abnormal gait, Decreased balance, Decreased  endurance, Decreased mobility, Impaired sensation, Decreased activity tolerance, Impaired flexibility  Visit Diagnosis: Muscle weakness (generalized)  Difficulty in walking, not elsewhere classified  Chronic pain of right knee     Problem List Patient Active Problem List   Diagnosis Date Noted  . Moderate recurrent major depression ([place]) 08/15/2016  . Chest pain due to myocardial ischemia 08/14/2016  . Chest pain, rule out acute myocardial infarction 08/14/2016    Alanson Puls , [place] DPT 12/04/2017, 4:29 PM  [place] MAIN [facility] SERVICES [address] [place], [place], [address] Phone: [phone]   Fax:  [phone]  Name: Charles Choi MRN: [id number] Date of Birth: 09/27/1951

## 2017-12-06 ENCOUNTER — Ambulatory Visit: Payer: 59 | Admitting: Physical Therapy

## 2017-12-06 ENCOUNTER — Ambulatory Visit: Payer: Medicare Other

## 2017-12-06 DIAGNOSIS — R262 Difficulty in walking, not elsewhere classified: Secondary | ICD-10-CM

## 2017-12-06 DIAGNOSIS — G8929 Other chronic pain: Secondary | ICD-10-CM

## 2017-12-06 DIAGNOSIS — M6281 Muscle weakness (generalized): Secondary | ICD-10-CM | POA: Diagnosis not present

## 2017-12-06 DIAGNOSIS — M25561 Pain in right knee: Secondary | ICD-10-CM

## 2017-12-06 NOTE — Therapy (Signed)
[place] [facility] [address] [place], [address] Phone: [phone]   Fax:  [phone]  Physical Therapy Treatment  Patient Details  Name: Charles Choi MRN: [id number] Date of Birth: November 09, 1950 Referring Provider: Fulton Reek D    Encounter Date: 12/06/2017  PT End of Session - 12/06/17 1439    Visit Number  7    Number of Visits  17    Date for PT Re-Evaluation  01/08/18    Authorization Time Period  of 10 g-code    PT Start Time  7619    PT Stop Time  1430    PT Time Calculation (min)  45 min    Equipment Utilized During Treatment  Gait belt    Activity Tolerance  Patient limited by pain    Behavior During Therapy  [facility] for tasks assessed/performed       Past Medical History:  Diagnosis Date  . Anxiety   . Cancer ([place])   . Depression   . Hypertension   . Parkinson's disease [facility])     Past Surgical History:  Procedure Laterality Date  . BACK SURGERY    . CHOLECYSTECTOMY    . DEEP BRAIN STIMULATOR PLACEMENT     for Parkinson's disease  . JOINT REPLACEMENT     left knee x2    There were no vitals filed for this visit.  Subjective Assessment - 12/06/17 1437    Subjective  Patient reports increased calf pain and is interested in performing dry needling to help decrease in calf pain and allow for performance of walking without pain.     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  Had PT at [facility] for 7 days.  Used the bike 6-10 minutes, as well as worked on walking, ROM for his R knee.  Started walking with a rw and is currently using a rollator walker.  Pt also adds that he was in a wc since October 2017 until the surgery due to pain. Patient has been on antiobiotic now due to the left knee being infected. The MD will test him in april to see if he continues to have an infection and if he needs to have any more surgery on the left knee.  Patients baseline for ambulation is that he was able to ambulate with a spc outdoors. He had PT in July for strengthening to be able to walk with a cane instead of the rollator.     Limitations  Walking;Standing    Patient Stated Goals  I want to walk without a walker, improve endurance.    Currently in Pain?  Yes    Pain Score  1     Pain Location  Calf    Pain Orientation  Right    Pain Descriptors / Indicators  Aching    Pain Type  Acute pain    Pain Onset  More than a month ago        TREATMENT:  Dry Needling (17min unbilled): (1) .26mm x 31mm, (1) .77mm x 139mm, (1) .54mm x 38mm length needles placed along patient's R calf towards the gastrocnemius with patient positioned in long sitting to decrease pain and spasms. Educated patient on benefits and risks of dry needling and patient verbally consents to performance of dry needling treatment.    Manual Therapy: STM utilizing superficial techniques to patient's gastrocnemius on the R side to improve  pain and spasms on the affected side with patient positioned in sitting  Therapeutic Exercise: Plantarflexion performed in long sitting -- x 20 with BTB Ankle pumps in long sitting -- x 20  Ankle inversion in long sitting -- x 20 with BTB   Patient demonstrates decreased pain at the end of the session.      PT Education - 12/06/17 1438    Education provided  Yes    Education Details  Added plantarflexion with BTB and inversion with BTB    Person(s) Educated  Patient    Methods  Explanation;Demonstration;Handout    Comprehension  Verbalized understanding;Returned demonstration       PT Short Term Goals - 11/14/17 0848      PT SHORT TERM GOAL #1   Title  Pt will be able to perform sit <> stand transfer from a regular chair independently without UE assist to promote function.     Baseline  needs UE support    Time  4    Period  Weeks    Status  New    Target Date  12/11/17      PT SHORT TERM GOAL #2   Title  Patient will  be able to stand at counter for 15 minutes to be able to complete a standing activity    Baseline  Patient has limited standing time with UE support and AD    Time  4    Period  Weeks    Status  New    Target Date  12/11/17        PT Long Term Goals - 11/14/17 0834      PT LONG TERM GOAL #1   Title  Patient will be independent in home exercise program to improve strength/mobility for better functional independence with ADLs    Baseline  no HEP    Time  8    Period  Weeks    Status  New    Target Date  01/08/18      PT LONG TERM GOAL #2   Title  Patient will ascend/descend 4 stairs with rail assist independently and ability to bring his rollator to the top or bottom of the steps , without loss of balance to improve ability to get in/out of home    Baseline  Patient reports difficulty with getting his assistive device up and down the steps and has had a fall trying to perform this activity    Time  8    Period  Weeks    Status  New    Target Date  01/08/18      PT LONG TERM GOAL #3   Title  Patient will increase 10 meter walk test to >1.29m/s as to improve gait speed for better community ambulation and to reduce fall risk.    Time  8    Period  Weeks    Status  New    Target Date  01/08/18      PT LONG TERM GOAL #4   Title  Patient (> 66 years old) will complete five times sit to stand test in < 15 seconds indicating an increased LE strength and improved balance.    Time  8    Period  Weeks    Status  New    Target Date  01/08/18      PT LONG TERM GOAL #5   Title  Patient will ambulate 50 feet indoors with spc without falls or using the furniture for support.  Baseline  needs a rollator    Time  8    Period  Weeks    Status  New    Target Date  01/08/18      Additional Long Term Goals   Additional Long Term Goals  Yes            Plan - 12/06/17 1439    Clinical Impression Statement  Patient demosntrates decrease in calf pain after performance of manual  therapy and dry needling to the area. Focused session improving ankle mobility and strengthening of the gastrocnemius and deep foot flexors after manual therapy/dry needling performance to improve carryover between sessions. Patient reports decreased pain with walking and will benefit from further skilled therapy to return to prior level of function.     Rehab Potential  Good    Clinical Impairments Affecting Rehab Potential  chronicity of condition, Parkinson's disease, TKR BLE    PT Frequency  2x / week    PT Duration  8 weeks    PT Treatment/Interventions  Neuromuscular re-education;Manual techniques;Therapeutic exercise;Therapeutic activities;Aquatic Therapy;Gait training;Patient/family education;Cryotherapy;Stair training;Balance training;Dry needling;Taping    PT Next Visit Plan  Knee extension ROM, gait, functional strengthening, balance training    PT Home Exercise Plan  needs HEP next visit, 6 MW test, TUG test,     Consulted and Agree with Plan of Care  Patient       Patient will benefit from skilled therapeutic intervention in order to improve the following deficits and impairments:  Pain, Postural dysfunction, Improper body mechanics, Difficulty walking, Decreased strength, Decreased range of motion, Abnormal gait, Decreased balance, Decreased endurance, Decreased mobility, Impaired sensation, Decreased activity tolerance, Impaired flexibility  Visit Diagnosis: Muscle weakness (generalized)  Difficulty in walking, not elsewhere classified  Chronic pain of right knee     Problem List Patient Active Problem List   Diagnosis Date Noted  . Moderate recurrent major depression ([place]) 08/15/2016  . Chest pain due to myocardial ischemia 08/14/2016  . Chest pain, rule out acute myocardial infarction 08/14/2016    Blythe Stanford, PT DPT 12/06/2017, 2:45 PM  [place] [facility] [address] [place], [address] Phone:  [phone]   Fax:  [phone]  Name: Charles Choi MRN: [id number] Date of Birth: Aug 21, 1951

## 2017-12-11 ENCOUNTER — Ambulatory Visit: Payer: Medicare Other | Attending: Internal Medicine | Admitting: Physical Therapy

## 2017-12-11 ENCOUNTER — Encounter: Payer: Self-pay | Admitting: Physical Therapy

## 2017-12-11 DIAGNOSIS — G8929 Other chronic pain: Secondary | ICD-10-CM | POA: Insufficient documentation

## 2017-12-11 DIAGNOSIS — M25561 Pain in right knee: Secondary | ICD-10-CM | POA: Insufficient documentation

## 2017-12-11 DIAGNOSIS — R262 Difficulty in walking, not elsewhere classified: Secondary | ICD-10-CM | POA: Insufficient documentation

## 2017-12-11 DIAGNOSIS — M6281 Muscle weakness (generalized): Secondary | ICD-10-CM | POA: Insufficient documentation

## 2017-12-11 NOTE — Therapy (Signed)
[place] MAIN [facility] SERVICES [address] [place], [place], [address] Phone: [phone]   Fax:  [phone]  Physical Therapy Treatment  Patient Details  Name: Charles Choi MRN: [id number] Date of Birth: Jul 09, 1951 Referring Provider: Fulton Reek D    Encounter Date: 12/11/2017  PT End of Session - 12/11/17 1513    Visit Number  8    Number of Visits  17    Date for PT Re-Evaluation  01/08/18    Authorization Time Period  of 10 g-code    PT Start Time  1509    PT Stop Time  1547    PT Time Calculation (min)  38 min    Equipment Utilized During Treatment  Gait belt    Activity Tolerance  Patient limited by pain    Behavior During Therapy  [facility] for tasks assessed/performed       Past Medical History:  Diagnosis Date  . Anxiety   . Cancer ([place])   . Depression   . Hypertension   . Parkinson's disease [facility])     Past Surgical History:  Procedure Laterality Date  . BACK SURGERY    . CHOLECYSTECTOMY    . DEEP BRAIN STIMULATOR PLACEMENT     for Parkinson's disease  . JOINT REPLACEMENT     left knee x2    There were no vitals filed for this visit.  Subjective Assessment - 12/11/17 1511    Subjective  Patient reports that he is having a mental block with being able to walk well today and he came into his appointment with a wc today.     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  Had PT at [facility] for 7 days.  Used the bike 6-10 minutes, as well as worked on walking, ROM for his R knee.  Started walking with a rw and is currently using a rollator walker.  Pt also adds that he was in a wc since October 2017 until the surgery due to pain. Patient has been on antiobiotic now due to the left knee being infected. The MD will test him in april to see if he continues to have an infection and if he needs to have any more surgery on the left knee. Patients baseline for ambulation is  that he was able to ambulate with a spc outdoors. He had PT in July for strengthening to be able to walk with a cane instead of the rollator.     Limitations  Walking;Standing    Patient Stated Goals  I want to walk without a walker, improve endurance.    Currently in Pain?  Yes    Pain Score  1     Pain Location  Foot    Pain Orientation  Right    Pain Descriptors / Indicators  Aching    Pain Type  Chronic pain    Pain Onset  More than a month ago    Pain Frequency  Intermittent    Multiple Pain Sites  No       Treatment; Supine bridging x 10 x 2, patient feels like his hamstrings might get a cramp Supine single leg bridge is too difficult SLR x 10 x 2 BLE; VC to keep his knee in extension Side lying  hip abd x 15 x 2 BLE, cues for not ER his foot, keeping his leg in some extension and not flex, and trunk  rotation hooklying hip abd/ER x 20 x 2 Trunk rotation left and right x 20 with VC for TA contraction and cues to keep his shoulders flat on the table Seated knee flex with RTB/ marching in sitting/ LAQ x 10 x 2 Sit to stand with RW x 10 x 2. Patient reports that his left leg feels heavy during the exercises, and the left one feels light                       PT Education - 12/11/17 1513    Education provided  Yes    Education Details  HEP progression    Person(s) Educated  Patient    Methods  Explanation;Demonstration    Comprehension  Verbalized understanding;Returned demonstration       PT Short Term Goals - 11/14/17 0848      PT SHORT TERM GOAL #1   Title  Pt will be able to perform sit <> stand transfer from a regular chair independently without UE assist to promote function.     Baseline  needs UE support    Time  4    Period  Weeks    Status  New    Target Date  12/11/17      PT SHORT TERM GOAL #2   Title  Patient will be able to stand at counter for 15 minutes to be able to complete a standing activity    Baseline  Patient has limited  standing time with UE support and AD    Time  4    Period  Weeks    Status  New    Target Date  12/11/17        PT Long Term Goals - 11/14/17 0834      PT LONG TERM GOAL #1   Title  Patient will be independent in home exercise program to improve strength/mobility for better functional independence with ADLs    Baseline  no HEP    Time  8    Period  Weeks    Status  New    Target Date  01/08/18      PT LONG TERM GOAL #2   Title  Patient will ascend/descend 4 stairs with rail assist independently and ability to bring his rollator to the top or bottom of the steps , without loss of balance to improve ability to get in/out of home    Baseline  Patient reports difficulty with getting his assistive device up and down the steps and has had a fall trying to perform this activity    Time  8    Period  Weeks    Status  New    Target Date  01/08/18      PT LONG TERM GOAL #3   Title  Patient will increase 10 meter walk test to >1.67m/s as to improve gait speed for better community ambulation and to reduce fall risk.    Time  8    Period  Weeks    Status  New    Target Date  01/08/18      PT LONG TERM GOAL #4   Title  Patient (> 31 years old) will complete five times sit to stand test in < 15 seconds indicating an increased LE strength and improved balance.    Time  8    Period  Weeks    Status  New    Target Date  01/08/18      PT  LONG TERM GOAL #5   Title  Patient will ambulate 50 feet indoors with spc without falls or using the furniture for support.    Baseline  needs a rollator    Time  8    Period  Weeks    Status  New    Target Date  01/08/18      Additional Long Term Goals   Additional Long Term Goals  Yes            Plan - 12/11/17 1515    Clinical Impression Statement  Pt requires direction and verbal cues for correct performance of exercises. Patient has fatigue with RLE foot clearance during swing phase and needs cues to pick up RLE.   Patient struggles with  speed during movement as well as balance with unstable surfaces. Pt encouraged to continue HEP  Patient will benefit from continued skilled PT to improve mobility and safety.    Rehab Potential  Good    Clinical Impairments Affecting Rehab Potential  chronicity of condition, Parkinson's disease, TKR BLE    PT Frequency  2x / week    PT Duration  8 weeks    PT Treatment/Interventions  Neuromuscular re-education;Manual techniques;Therapeutic exercise;Therapeutic activities;Aquatic Therapy;Gait training;Patient/family education;Cryotherapy;Stair training;Balance training;Dry needling;Taping    PT Next Visit Plan  Knee extension ROM, gait, functional strengthening, balance training    PT Home Exercise Plan  needs HEP next visit, 6 MW test, TUG test,     Consulted and Agree with Plan of Care  Patient       Patient will benefit from skilled therapeutic intervention in order to improve the following deficits and impairments:  Pain, Postural dysfunction, Improper body mechanics, Difficulty walking, Decreased strength, Decreased range of motion, Abnormal gait, Decreased balance, Decreased endurance, Decreased mobility, Impaired sensation, Decreased activity tolerance, Impaired flexibility  Visit Diagnosis: Muscle weakness (generalized)  Difficulty in walking, not elsewhere classified  Chronic pain of right knee     Problem List Patient Active Problem List   Diagnosis Date Noted  . Moderate recurrent major depression ([place]) 08/15/2016  . Chest pain due to myocardial ischemia 08/14/2016  . Chest pain, rule out acute myocardial infarction 08/14/2016    Alanson Puls , [place] DPT 12/11/2017, 3:17 PM  [place] MAIN [facility] SERVICES [address] [place], [place], [address] Phone: [phone]   Fax:  [phone]  Name: Charles Choi MRN: [id number] Date of Birth: June 30, 1951

## 2017-12-13 ENCOUNTER — Ambulatory Visit: Payer: Medicare Other | Admitting: Physical Therapy

## 2017-12-13 ENCOUNTER — Encounter: Payer: Self-pay | Admitting: Physical Therapy

## 2017-12-13 DIAGNOSIS — M6281 Muscle weakness (generalized): Secondary | ICD-10-CM

## 2017-12-13 DIAGNOSIS — R262 Difficulty in walking, not elsewhere classified: Secondary | ICD-10-CM

## 2017-12-13 NOTE — Therapy (Signed)
[place] MAIN [facility] SERVICES [address] [place], [place], [address] Phone: [phone]   Fax:  [phone]  Physical Therapy Treatment  Patient Details  Name: Charles Choi MRN: [id number] Date of Birth: 03-11-1951 Referring Provider: Fulton Reek D    Encounter Date: 12/13/2017  PT End of Session - 12/13/17 1534    Visit Number  9    Number of Visits  17    Date for PT Re-Evaluation  01/08/18    PT Start Time  1510    PT Stop Time  1550    PT Time Calculation (min)  40 min    Equipment Utilized During Treatment  Gait belt    Activity Tolerance  Patient limited by fatigue    Behavior During Therapy  [facility] for tasks assessed/performed       Past Medical History:  Diagnosis Date  . Anxiety   . Cancer ([place])   . Depression   . Hypertension   . Parkinson's disease [facility])     Past Surgical History:  Procedure Laterality Date  . BACK SURGERY    . CHOLECYSTECTOMY    . DEEP BRAIN STIMULATOR PLACEMENT     for Parkinson's disease  . JOINT REPLACEMENT     left knee x2    There were no vitals filed for this visit.  Subjective Assessment - 12/13/17 1533    Subjective  Patient reports that he is having a mental block with being able to walk well today . He needed a ride in a wc today.     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  Had PT at [facility] for 7 days.  Used the bike 6-10 minutes, as well as worked on walking, ROM for his R knee.  Started walking with a rw and is currently using a rollator walker.  Pt also adds that he was in a wc since October 2017 until the surgery due to pain. Patient has been on antiobiotic now due to the left knee being infected. The MD will test him in april to see if he continues to have an infection and if he needs to have any more surgery on the left knee. Patients baseline for ambulation is that he was able to ambulate with a spc outdoors. He had PT  in July for strengthening to be able to walk with a cane instead of the rollator.     Limitations  Walking;Standing    Patient Stated Goals  I want to walk without a walker, improve endurance.    Currently in Pain?  No/denies    Pain Score  0-No pain    Pain Onset  More than a month ago      Treatment: Nu-step x 5 mins warm up  Leg press 105 lbs x 20 x 3   TM walking . 5 miles / hour x 5 mins with decreased step length on RLE  Gait training with rollator 120 feet x 2 with cues for increased step length RLE  Standing with weight shifting fwd/ bwd x 10   Bridging x 20   SLR x 10 x 2 BLE, cues to keep his knee straight  sidelying hip abd x 10 x 2 with cues for correct position    mod verbal cues used throughout with increased in postural sway and LOB most seen with narrow base of support and while on uneven surfaces. Continues to have  balance deficits typical with diagnosis. Patient performs intermediate level exercises without pain behaviors and needs verbal cuing for postural alignment and head positioning                           PT Education - 12/13/17 1534    Education provided  Yes    Education Details  stretching    Person(s) Educated  Patient    Methods  Explanation    Comprehension  Verbalized understanding       PT Short Term Goals - 11/14/17 0848      PT SHORT TERM GOAL #1   Title  Pt will be able to perform sit <> stand transfer from a regular chair independently without UE assist to promote function.     Baseline  needs UE support    Time  4    Period  Weeks    Status  New    Target Date  12/11/17      PT SHORT TERM GOAL #2   Title  Patient will be able to stand at counter for 15 minutes to be able to complete a standing activity    Baseline  Patient has limited standing time with UE support and AD    Time  4    Period  Weeks    Status  New    Target Date  12/11/17        PT Long Term Goals - 11/14/17 0834      PT LONG TERM  GOAL #1   Title  Patient will be independent in home exercise program to improve strength/mobility for better functional independence with ADLs    Baseline  no HEP    Time  8    Period  Weeks    Status  New    Target Date  01/08/18      PT LONG TERM GOAL #2   Title  Patient will ascend/descend 4 stairs with rail assist independently and ability to bring his rollator to the top or bottom of the steps , without loss of balance to improve ability to get in/out of home    Baseline  Patient reports difficulty with getting his assistive device up and down the steps and has had a fall trying to perform this activity    Time  8    Period  Weeks    Status  New    Target Date  01/08/18      PT LONG TERM GOAL #3   Title  Patient will increase 10 meter walk test to >1.27m/s as to improve gait speed for better community ambulation and to reduce fall risk.    Time  8    Period  Weeks    Status  New    Target Date  01/08/18      PT LONG TERM GOAL #4   Title  Patient (> 31 years old) will complete five times sit to stand test in < 15 seconds indicating an increased LE strength and improved balance.    Time  8    Period  Weeks    Status  New    Target Date  01/08/18      PT LONG TERM GOAL #5   Title  Patient will ambulate 50 feet indoors with spc without falls or using the furniture for support.    Baseline  needs a rollator    Time  8    Period  Weeks  Status  New    Target Date  01/08/18      Additional Long Term Goals   Additional Long Term Goals  Yes            Plan - 12/13/17 1536    Clinical Impression Statement  Patient has decreased gait and abnormal gait with small steps and poor balance. He has fatigue with gait and exercises today. He has decreased knee extension bilaterally with flexed posture and flexed knees and  hips. Patient needs cues on TM for posture, longer leg step on RLE with heel toe step. He will benefit from continued skilled services to improve safety ,  strength, and gait.     Rehab Potential  Good    Clinical Impairments Affecting Rehab Potential  chronicity of condition, Parkinson's disease, TKR BLE    PT Frequency  2x / week    PT Duration  8 weeks    PT Treatment/Interventions  Neuromuscular re-education;Manual techniques;Therapeutic exercise;Therapeutic activities;Aquatic Therapy;Gait training;Patient/family education;Cryotherapy;Stair training;Balance training;Dry needling;Taping    PT Next Visit Plan  Knee extension ROM, gait, functional strengthening, balance training    PT Home Exercise Plan  needs HEP next visit, 6 MW test, TUG test,     Consulted and Agree with Plan of Care  Patient       Patient will benefit from skilled therapeutic intervention in order to improve the following deficits and impairments:  Pain, Postural dysfunction, Improper body mechanics, Difficulty walking, Decreased strength, Decreased range of motion, Abnormal gait, Decreased balance, Decreased endurance, Decreased mobility, Impaired sensation, Decreased activity tolerance, Impaired flexibility  Visit Diagnosis: Muscle weakness (generalized)  Difficulty in walking, not elsewhere classified     Problem List Patient Active Problem List   Diagnosis Date Noted  . Moderate recurrent major depression ([place]) 08/15/2016  . Chest pain due to myocardial ischemia 08/14/2016  . Chest pain, rule out acute myocardial infarction 08/14/2016    Alanson Puls, PT DPT 12/13/2017, 4:55 PM  [place] MAIN [facility] SERVICES [address] [place], [place], [address] Phone: [phone]   Fax:  [phone]  Name: Charles Choi MRN: [id number] Date of Birth: [birth year]

## 2017-12-18 ENCOUNTER — Ambulatory Visit: Payer: Medicare Other | Admitting: Physical Therapy

## 2017-12-20 ENCOUNTER — Ambulatory Visit: Payer: Medicare Other | Admitting: Physical Therapy

## 2017-12-20 ENCOUNTER — Encounter: Payer: Self-pay | Admitting: Physical Therapy

## 2017-12-20 DIAGNOSIS — G8929 Other chronic pain: Secondary | ICD-10-CM

## 2017-12-20 DIAGNOSIS — M25561 Pain in right knee: Secondary | ICD-10-CM

## 2017-12-20 DIAGNOSIS — M6281 Muscle weakness (generalized): Secondary | ICD-10-CM | POA: Diagnosis not present

## 2017-12-20 DIAGNOSIS — R262 Difficulty in walking, not elsewhere classified: Secondary | ICD-10-CM

## 2017-12-20 NOTE — Therapy (Signed)
[place] MAIN [facility] SERVICES [address] [place], [place], [address] Phone: [phone]   Fax:  [phone]  Physical Therapy Treatment  Patient Details  Name: Charles Choi MRN: [id number] Date of Birth: Mar 29, 1951 Referring Provider: Fulton Reek D    Encounter Date: 12/20/2017  PT End of Session - 12/20/17 1512    Visit Number  9    Number of Visits  17    Date for PT Re-Evaluation  01/08/18    PT Start Time  0300    PT Stop Time  0345    PT Time Calculation (min)  45 min    Equipment Utilized During Treatment  Gait belt    Activity Tolerance  Patient limited by fatigue    Behavior During Therapy  [facility] for tasks assessed/performed       Past Medical History:  Diagnosis Date  . Anxiety   . Cancer ([place])   . Depression   . Hypertension   . Parkinson's disease [facility])     Past Surgical History:  Procedure Laterality Date  . BACK SURGERY    . CHOLECYSTECTOMY    . DEEP BRAIN STIMULATOR PLACEMENT     for Parkinson's disease  . JOINT REPLACEMENT     left knee x2    There were no vitals filed for this visit.  Subjective Assessment - 12/20/17 1509    Subjective  Patient reports that his right calf is very sore. He is doing well today with his walking.     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  Had PT at [facility] for 7 days.  Used the bike 6-10 minutes, as well as worked on walking, ROM for his R knee.  Started walking with a rw and is currently using a rollator walker.  Pt also adds that he was in a wc since October 2017 until the surgery due to pain. Patient has been on antiobiotic now due to the left knee being infected. The MD will test him in april to see if he continues to have an infection and if he needs to have any more surgery on the left knee. Patients baseline for ambulation is that he was able to ambulate with a spc outdoors. He had PT in July for strengthening  to be able to walk with a cane instead of the rollator.     Limitations  Walking;Standing    Patient Stated Goals  I want to walk without a walker, improve endurance.    Currently in Pain?  Yes    Pain Score  3     Pain Location  Calf    Pain Orientation  Right    Pain Descriptors / Indicators  Aching    Pain Type  Chronic pain    Pain Onset  More than a month ago    Pain Frequency  Intermittent    Aggravating Factors   weight bearing and walking    Pain Relieving Factors  resting    Effect of Pain on Daily Activities  not able to walk as much    Multiple Pain Sites  No       Treatment:  [employer] x 5 mins  Standing in parallel bars with step fwd, step bwd left LE x 10 ; RLE x 10 , cues for posture and correct technique  Standing in parallel bars with feet in stepping pattern and rocking fwd/bwd and then  swinging arms reciprocally x 20, cues to lift back heel with fwd rock, and front toe with backward rock   Step ups to 6 inch stool x 15 , cues to watch to not catch his toe  Hamstring stretching on second step x 30 sec x 3 LLE and RLE, cues to keep his knee straight and pull toe up  Seated long leg stretch x 30 sec hamstring stretch x 2 BLE  Prone stretching hip flexors  X 30 sec x 3 BLE  CGA and  mod verbal cues used throughout with increased in postural sway and LOB most seen with narrow base of support and while on uneven surfaces. Continues to have balance deficits typical with diagnosis. Patient performs intermediate level exercises without pain behaviors and needs verbal cuing for postural alignment and head positioning                         PT Education - 12/20/17 1512    Education provided  Yes    Education Details  HEP    Person(s) Educated  Patient    Methods  Explanation;Demonstration    Comprehension  Verbalized understanding;Tactile cues required;Returned demonstration       PT Short Term Goals - 11/14/17 0848      PT SHORT  TERM GOAL #1   Title  Pt will be able to perform sit <> stand transfer from a regular chair independently without UE assist to promote function.     Baseline  needs UE support    Time  4    Period  Weeks    Status  New    Target Date  12/11/17      PT SHORT TERM GOAL #2   Title  Patient will be able to stand at counter for 15 minutes to be able to complete a standing activity    Baseline  Patient has limited standing time with UE support and AD    Time  4    Period  Weeks    Status  New    Target Date  12/11/17        PT Long Term Goals - 11/14/17 0834      PT LONG TERM GOAL #1   Title  Patient will be independent in home exercise program to improve strength/mobility for better functional independence with ADLs    Baseline  no HEP    Time  8    Period  Weeks    Status  New    Target Date  01/08/18      PT LONG TERM GOAL #2   Title  Patient will ascend/descend 4 stairs with rail assist independently and ability to bring his rollator to the top or bottom of the steps , without loss of balance to improve ability to get in/out of home    Baseline  Patient reports difficulty with getting his assistive device up and down the steps and has had a fall trying to perform this activity    Time  8    Period  Weeks    Status  New    Target Date  01/08/18      PT LONG TERM GOAL #3   Title  Patient will increase 10 meter walk test to >1.42m/s as to improve gait speed for better community ambulation and to reduce fall risk.    Time  8    Period  Weeks    Status  New  Target Date  01/08/18      PT LONG TERM GOAL #4   Title  Patient (> 27 years old) will complete five times sit to stand test in < 15 seconds indicating an increased LE strength and improved balance.    Time  8    Period  Weeks    Status  New    Target Date  01/08/18      PT LONG TERM GOAL #5   Title  Patient will ambulate 50 feet indoors with spc without falls or using the furniture for support.    Baseline  needs a  rollator    Time  8    Period  Weeks    Status  New    Target Date  01/08/18      Additional Long Term Goals   Additional Long Term Goals  Yes            Plan - 12/20/17 1513    Clinical Impression Statement  Patient performs standing dynamic balance training and weight shifting activities with bettter movement, better coordination, and better motor planning. He is able to tolerate standing and has better strength and less pain behaviors. He was able to be progressed in balance and will continue to benefit from skilled PT to improve mobility.     Rehab Potential  Good    Clinical Impairments Affecting Rehab Potential  chronicity of condition, Parkinson's disease, TKR BLE    PT Frequency  2x / week    PT Duration  8 weeks    PT Treatment/Interventions  Neuromuscular re-education;Manual techniques;Therapeutic exercise;Therapeutic activities;Aquatic Therapy;Gait training;Patient/family education;Cryotherapy;Stair training;Balance training;Dry needling;Taping    PT Next Visit Plan  Knee extension ROM, gait, functional strengthening, balance training    PT Home Exercise Plan  needs HEP next visit, 6 MW test, TUG test,     Consulted and Agree with Plan of Care  Patient       Patient will benefit from skilled therapeutic intervention in order to improve the following deficits and impairments:  Pain, Postural dysfunction, Improper body mechanics, Difficulty walking, Decreased strength, Decreased range of motion, Abnormal gait, Decreased balance, Decreased endurance, Decreased mobility, Impaired sensation, Decreased activity tolerance, Impaired flexibility  Visit Diagnosis: Muscle weakness (generalized)  Difficulty in walking, not elsewhere classified  Chronic pain of right knee     Problem List Patient Active Problem List   Diagnosis Date Noted  . Moderate recurrent major depression ([place]) 08/15/2016  . Chest pain due to myocardial ischemia 08/14/2016  . Chest pain, rule out  acute myocardial infarction 08/14/2016    Alanson Puls, [place] DPT 12/20/2017, 3:47 PM  [place] MAIN [facility] SERVICES [address] [place], [place], [address] Phone: [phone]   Fax:  [phone]  Name: Charles Choi MRN: [id number] Date of Birth: [birth year]

## 2017-12-25 ENCOUNTER — Ambulatory Visit: Payer: Medicare Other | Admitting: Physical Therapy

## 2017-12-25 ENCOUNTER — Encounter: Payer: Self-pay | Admitting: Physical Therapy

## 2017-12-25 DIAGNOSIS — R262 Difficulty in walking, not elsewhere classified: Secondary | ICD-10-CM

## 2017-12-25 DIAGNOSIS — M6281 Muscle weakness (generalized): Secondary | ICD-10-CM | POA: Diagnosis not present

## 2017-12-25 NOTE — Therapy (Signed)
[place] MAIN [facility] SERVICES [address] [place], [place], [address] Phone: [phone]   Fax:  [phone]  Physical Therapy Treatment  Patient Details  Name: Charles Choi MRN: [id number] Date of Birth: 07-09-1951 Referring Provider: Fulton Reek D    Encounter Date: 12/25/2017  PT End of Session - 12/25/17 1510    Visit Number  10    Number of Visits  17    Date for PT Re-Evaluation  01/08/18    PT Start Time  0302    PT Stop Time  0340    PT Time Calculation (min)  38 min    Equipment Utilized During Treatment  Gait belt    Activity Tolerance  Patient limited by fatigue    Behavior During Therapy  [facility] for tasks assessed/performed       Past Medical History:  Diagnosis Date  . Anxiety   . Cancer ([place])   . Depression   . Hypertension   . Parkinson's disease [facility])     Past Surgical History:  Procedure Laterality Date  . BACK SURGERY    . CHOLECYSTECTOMY    . DEEP BRAIN STIMULATOR PLACEMENT     for Parkinson's disease  . JOINT REPLACEMENT     left knee x2    There were no vitals filed for this visit.  Subjective Assessment - 12/25/17 1508    Subjective  Patient reports that he had a Dr. appointment and his inflamation numbers are better.     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  Had PT at [facility] for 7 days.  Used the bike 6-10 minutes, as well as worked on walking, ROM for his R knee.  Started walking with a rw and is currently using a rollator walker.  Pt also adds that he was in a wc since October 2017 until the surgery due to pain. Patient has been on antiobiotic now due to the left knee being infected. The MD will test him in april to see if he continues to have an infection and if he needs to have any more surgery on the left knee. Patients baseline for ambulation is that he was able to ambulate with a spc outdoors. He had PT in July for strengthening to  be able to walk with a cane instead of the rollator.     Limitations  Walking;Standing    Patient Stated Goals  I want to walk without a walker, improve endurance.    Currently in Pain?  Yes    Pain Score  1     Pain Location  Foot    Pain Orientation  Right    Pain Descriptors / Indicators  Aching    Pain Type  Chronic pain    Pain Onset  Today    Pain Frequency  Intermittent    Aggravating Factors   weight bearing and walking    Pain Relieving Factors  resting    Effect of Pain on Daily Activities  not able to walk as much    Multiple Pain Sites  No       Treatment:  Stretching including hamstring x 30 x 3 BLE Stretching including calfs x 3 x 30 , BLE stretching low back with B knee to chest  x 30 x 3 , BLE Gait training with spc and 1500 feet with better stepping pattern and fatigue following  PT Education - 12/25/17 1510    Education provided  Yes    Education Details  HEP    Person(s) Educated  Patient    Methods  Explanation;Demonstration    Comprehension  Verbalized understanding;Returned demonstration       PT Short Term Goals - 11/14/17 0848      PT SHORT TERM GOAL #1   Title  Pt will be able to perform sit <> stand transfer from a regular chair independently without UE assist to promote function.     Baseline  needs UE support    Time  4    Period  Weeks    Status  New    Target Date  12/11/17      PT SHORT TERM GOAL #2   Title  Patient will be able to stand at counter for 15 minutes to be able to complete a standing activity    Baseline  Patient has limited standing time with UE support and AD    Time  4    Period  Weeks    Status  New    Target Date  12/11/17        PT Long Term Goals - 11/14/17 0834      PT LONG TERM GOAL #1   Title  Patient will be independent in home exercise program to improve strength/mobility for better functional independence with ADLs    Baseline  no HEP    Time  8    Period   Weeks    Status  New    Target Date  01/08/18      PT LONG TERM GOAL #2   Title  Patient will ascend/descend 4 stairs with rail assist independently and ability to bring his rollator to the top or bottom of the steps , without loss of balance to improve ability to get in/out of home    Baseline  Patient reports difficulty with getting his assistive device up and down the steps and has had a fall trying to perform this activity    Time  8    Period  Weeks    Status  New    Target Date  01/08/18      PT LONG TERM GOAL #3   Title  Patient will increase 10 meter walk test to >1.49m/s as to improve gait speed for better community ambulation and to reduce fall risk.    Time  8    Period  Weeks    Status  New    Target Date  01/08/18      PT LONG TERM GOAL #4   Title  Patient (> 55 years old) will complete five times sit to stand test in < 15 seconds indicating an increased LE strength and improved balance.    Time  8    Period  Weeks    Status  New    Target Date  01/08/18      PT LONG TERM GOAL #5   Title  Patient will ambulate 50 feet indoors with spc without falls or using the furniture for support.    Baseline  needs a rollator    Time  8    Period  Weeks    Status  New    Target Date  01/08/18      Additional Long Term Goals   Additional Long Term Goals  Yes            Plan - 12/25/17 1511  Clinical Impression Statement  Patient performs stretching of calfs, hamstring, and low back for 30 sec per side x 3 sets. He has improved BLE knee extension PROM and AROM . He is able to ambulate with less limp and faster gait speed with spc 1500 feet. He was educated about the importance of stretching and was asked to add this into the his HEP. He will continue to benefit from skilled PT to improve strength, mobility, ROM and safety.     Rehab Potential  Good    Clinical Impairments Affecting Rehab Potential  chronicity of condition, Parkinson's disease, TKR BLE    PT Frequency   2x / week    PT Duration  8 weeks    PT Treatment/Interventions  Neuromuscular re-education;Manual techniques;Therapeutic exercise;Therapeutic activities;Aquatic Therapy;Gait training;Patient/family education;Cryotherapy;Stair training;Balance training;Dry needling;Taping    PT Next Visit Plan  Knee extension ROM, gait, functional strengthening, balance training    PT Home Exercise Plan  needs HEP next visit, 6 MW test, TUG test,     Consulted and Agree with Plan of Care  Patient       Patient will benefit from skilled therapeutic intervention in order to improve the following deficits and impairments:  Pain, Postural dysfunction, Improper body mechanics, Difficulty walking, Decreased strength, Decreased range of motion, Abnormal gait, Decreased balance, Decreased endurance, Decreased mobility, Impaired sensation, Decreased activity tolerance, Impaired flexibility  Visit Diagnosis: Muscle weakness (generalized)  Difficulty in walking, not elsewhere classified     Problem List Patient Active Problem List   Diagnosis Date Noted  . Moderate recurrent major depression ([place]) 08/15/2016  . Chest pain due to myocardial ischemia 08/14/2016  . Chest pain, rule out acute myocardial infarction 08/14/2016    Alanson Puls, [place] DPT 12/25/2017, 3:50 PM  [place] MAIN [facility] SERVICES [address] [place], [place], [address] Phone: [phone]   Fax:  [phone]  Name: ZYDEN SUMAN MRN: [id number] Date of Birth: 08-Aug-1951

## 2017-12-27 ENCOUNTER — Encounter: Payer: Self-pay | Admitting: Physical Therapy

## 2017-12-27 ENCOUNTER — Ambulatory Visit: Payer: Medicare Other | Admitting: Physical Therapy

## 2017-12-27 DIAGNOSIS — M6281 Muscle weakness (generalized): Secondary | ICD-10-CM | POA: Diagnosis not present

## 2017-12-27 DIAGNOSIS — M25561 Pain in right knee: Secondary | ICD-10-CM

## 2017-12-27 DIAGNOSIS — G8929 Other chronic pain: Secondary | ICD-10-CM

## 2017-12-27 DIAGNOSIS — R262 Difficulty in walking, not elsewhere classified: Secondary | ICD-10-CM

## 2017-12-27 NOTE — Therapy (Signed)
[place] MAIN [facility] SERVICES [address] [place], [place], [address] Phone: [phone]   Fax:  [phone]  Physical Therapy Treatment  Patient Details  Name: Charles Choi MRN: [id number] Date of Birth: 1951/06/16 Referring Provider: Fulton Reek D    Encounter Date: 12/27/2017  PT End of Session - 12/27/17 1513    Visit Number  11    Number of Visits  17    Date for PT Re-Evaluation  01/08/18    PT Start Time  0305    PT Stop Time  0345    PT Time Calculation (min)  40 min    Equipment Utilized During Treatment  Gait belt    Activity Tolerance  Patient limited by fatigue    Behavior During Therapy  [facility] for tasks assessed/performed       Past Medical History:  Diagnosis Date  . Anxiety   . Cancer ([place])   . Depression   . Hypertension   . Parkinson's disease [facility])     Past Surgical History:  Procedure Laterality Date  . BACK SURGERY    . CHOLECYSTECTOMY    . DEEP BRAIN STIMULATOR PLACEMENT     for Parkinson's disease  . JOINT REPLACEMENT     left knee x2    There were no vitals filed for this visit.  Subjective Assessment - 12/27/17 1512    Subjective  Patient reports that he had a Dr. appointment and his inflamation numbers are better.     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  Had PT at [facility] for 7 days.  Used the bike 6-10 minutes, as well as worked on walking, ROM for his R knee.  Started walking with a rw and is currently using a rollator walker.  Pt also adds that he was in a wc since October 2017 until the surgery due to pain. Patient has been on antiobiotic now due to the left knee being infected. The MD will test him in april to see if he continues to have an infection and if he needs to have any more surgery on the left knee. Patients baseline for ambulation is that he was able to ambulate with a spc outdoors. He had PT in July for strengthening to  be able to walk with a cane instead of the rollator.     Limitations  Walking;Standing    Patient Stated Goals  I want to walk without a walker, improve endurance.    Currently in Pain?  Yes    Pain Score  1     Pain Location  Foot    Pain Orientation  Right    Pain Descriptors / Indicators  Sore    Pain Type  Chronic pain    Pain Onset  Today       Therapeutic exercise:  Leg press 120 lbs x 20 x 2 sets  TM walking . 2 elevation and . 6 m/ hour x 10 mins  Outcome measures performed including 10 MW, 5 x sit to stand and all goals reviewed with progress made  Side stepping in parallel bars x 10 x 2 without UE and with cues for upright posture., 60 lbs x 10 x 2 heel raises.  CGA and Min to mod verbal cues used throughout with increased in postural sway and LOB most seen with narrow base of support and while on uneven surfaces. Continues to have balance  deficits typical with diagnosis. Patient performs intermediate level exercises without pain behaviors and needs verbal cuing for postural alignment and head positioning                      PT Education - 12/27/17 1513    Education provided  Yes    Education Details  HEP    Person(s) Educated  Patient    Methods  Explanation;Demonstration;Tactile cues;Verbal cues    Comprehension  Verbalized understanding;Returned demonstration       PT Short Term Goals - 11/14/17 0848      PT SHORT TERM GOAL #1   Title  Pt will be able to perform sit <> stand transfer from a regular chair independently without UE assist to promote function.     Baseline  needs UE support    Time  4    Period  Weeks    Status  New    Target Date  12/11/17      PT SHORT TERM GOAL #2   Title  Patient will be able to stand at counter for 15 minutes to be able to complete a standing activity    Baseline  Patient has limited standing time with UE support and AD    Time  4    Period  Weeks    Status  New    Target Date  12/11/17         PT Long Term Goals - 12/27/17 1516      PT LONG TERM GOAL #1   Title  Patient will be independent in home exercise program to improve strength/mobility for better functional independence with ADLs    Baseline  no HEP    Time  8    Period  Weeks    Status  On-going    Target Date  01/08/18      PT LONG TERM GOAL #2   Title  Patient will ascend/descend 4 stairs with rail assist independently and ability to bring his rollator to the top or bottom of the steps , without loss of balance to improve ability to get in/out of home    Baseline  Patient reports difficulty with getting his assistive device up and down the steps and has had a fall trying to perform this activity    Time  8    Period  Weeks    Status  On-going    Target Date  01/08/18      PT LONG TERM GOAL #3   Title  Patient will increase 10 meter walk test to >1.33m/s as to improve gait speed for better community ambulation and to reduce fall risk.    Baseline  1.31 m/sec    Time  8    Period  Weeks    Status  On-going    Target Date  01/08/18      PT LONG TERM GOAL #4   Title  Patient (> 52 years old) will complete five times sit to stand test in < 15 seconds indicating an increased LE strength and improved balance.    Baseline  18.22 sec    Time  8    Period  Weeks    Status  On-going    Target Date  01/08/18      PT LONG TERM GOAL #5   Title  Patient will ambulate 50 feet indoors with spc without falls or using the furniture for support.    Baseline  spc  Time  8    Period  Weeks    Status  On-going    Target Date  01/08/18            Plan - 12/27/17 1513    Clinical Impression Statement  Patient demonstrates decreased gait speed with deviations and requires verbal and tactile cueing to maintain center of gravity during ambulation.  Patient also requires CGA during all dynamic standing balance activities. Patient requires consistent cueing to maintain correct position during gait activities .  Patient demonstrates difficulty with gait and increased postural sway while on TM.  Patient will continue to benefit from skilled therapy in order to improve dynamic standing balance and increase endurance    Rehab Potential  Good    Clinical Impairments Affecting Rehab Potential  chronicity of condition, Parkinson's disease, TKR BLE    PT Frequency  2x / week    PT Duration  8 weeks    PT Treatment/Interventions  Neuromuscular re-education;Manual techniques;Therapeutic exercise;Therapeutic activities;Aquatic Therapy;Gait training;Patient/family education;Cryotherapy;Stair training;Balance training;Dry needling;Taping    PT Next Visit Plan  Knee extension ROM, gait, functional strengthening, balance training    PT Home Exercise Plan  needs HEP next visit, 6 MW test, TUG test,     Consulted and Agree with Plan of Care  Patient       Patient will benefit from skilled therapeutic intervention in order to improve the following deficits and impairments:  Pain, Postural dysfunction, Improper body mechanics, Difficulty walking, Decreased strength, Decreased range of motion, Abnormal gait, Decreased balance, Decreased endurance, Decreased mobility, Impaired sensation, Decreased activity tolerance, Impaired flexibility  Visit Diagnosis: Muscle weakness (generalized)  Difficulty in walking, not elsewhere classified  Chronic pain of right knee     Problem List Patient Active Problem List   Diagnosis Date Noted  . Moderate recurrent major depression ([place]) 08/15/2016  . Chest pain due to myocardial ischemia 08/14/2016  . Chest pain, rule out acute myocardial infarction 08/14/2016    Alanson Puls,, PT DPT 12/27/2017, 3:34 PM  [place] MAIN [facility] SERVICES [address] [place], [place], [address] Phone: [phone]   Fax:  [phone]  Name: KACYN SOUDER MRN: [id number] Date of Birth: Sep 29, 1951

## 2018-01-01 ENCOUNTER — Ambulatory Visit: Payer: Medicare Other | Admitting: Physical Therapy

## 2018-01-04 ENCOUNTER — Ambulatory Visit: Payer: Medicare Other | Admitting: Physical Therapy

## 2018-01-04 ENCOUNTER — Encounter: Payer: Self-pay | Admitting: Physical Therapy

## 2018-01-04 DIAGNOSIS — M25561 Pain in right knee: Secondary | ICD-10-CM

## 2018-01-04 DIAGNOSIS — R262 Difficulty in walking, not elsewhere classified: Secondary | ICD-10-CM

## 2018-01-04 DIAGNOSIS — M6281 Muscle weakness (generalized): Secondary | ICD-10-CM

## 2018-01-04 DIAGNOSIS — G8929 Other chronic pain: Secondary | ICD-10-CM

## 2018-01-04 NOTE — Therapy (Signed)
[place] MAIN [facility] SERVICES [address] [place], [place], [address] Phone: [phone]   Fax:  [phone]  Physical Therapy Treatment  Patient Details  Name: Charles Choi MRN: [id number] Date of Birth: Oct 31, 1951 Referring Provider: Fulton Reek D    Encounter Date: 01/04/2018  PT End of Session - 01/04/18 1525    Visit Number  12    Number of Visits  17    Date for PT Re-Evaluation  01/08/18    PT Start Time  0322    PT Stop Time  0400    PT Time Calculation (min)  38 min    Equipment Utilized During Treatment  Gait belt    Activity Tolerance  Patient limited by fatigue    Behavior During Therapy  [facility] for tasks assessed/performed       Past Medical History:  Diagnosis Date  . Anxiety   . Cancer ([place])   . Depression   . Hypertension   . Parkinson's disease [facility])     Past Surgical History:  Procedure Laterality Date  . BACK SURGERY    . CHOLECYSTECTOMY    . DEEP BRAIN STIMULATOR PLACEMENT     for Parkinson's disease  . JOINT REPLACEMENT     left knee x2    There were no vitals filed for this visit.  Subjective Assessment - 01/04/18 1528    Subjective  Patient reports that he had a Dr. appointment for his right foot pain .     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  Had PT at [facility] for 7 days.  Used the bike 6-10 minutes, as well as worked on walking, ROM for his R knee.  Started walking with a rw and is currently using a rollator walker.  Pt also adds that he was in a wc since October 2017 until the surgery due to pain. Patient has been on antiobiotic now due to the left knee being infected. The MD will test him in april to see if he continues to have an infection and if he needs to have any more surgery on the left knee. Patients baseline for ambulation is that he was able to ambulate with a spc outdoors. He had PT in July for strengthening to be able to walk  with a cane instead of the rollator.     Limitations  Walking;Standing    Patient Stated Goals  I want to walk without a walker, improve endurance.    Currently in Pain?  Yes    Pain Score  2     Pain Location  Foot    Pain Orientation  Right    Pain Descriptors / Indicators  Aching    Pain Type  Chronic pain    Pain Onset  More than a month ago    Pain Frequency  Constant    Aggravating Factors   weight bearing walking    Pain Relieving Factors  resting     Multiple Pain Sites  No       Treatment:  [employer] x 5 mins  TM walking . 7, . 8, . 9 miles / hour x 5 mins x 2  , cues for heel toe gait and longer steps  Gait training with spc and cues for longer steps and heel toe gait x 200 feet x 2, cues to take longer steps, take higher steps  Standing in parallel bars  with step fwd, step bwd left LE x 10 ; RLE x 10 , cues for posture and correct technique  Standing in parallel bars with feet in stepping pattern and rocking fwd/bwd and then swinging arms reciprocally x 20, cues to lift back heel with fwd rock, and front toe with backward rock   Step ups to 6 inch stool x 15 , cues to watch to not catch his toe  Hamstring stretching on second step x 30 sec x 3 LLE and RLE, cues to keep his knee straight and pull toe up    CGA and  mod verbal cues used throughout with increased in postural sway and LOB most seen with narrow base of support and while on uneven surfaces. Continues to have balance deficits typical with diagnosis. Patient performs intermediate level exercises with minimal  pain behaviors and needs verbal cuing for postural alignment.                       PT Education - 01/04/18 1529    Education provided  Yes    Education Details  HEP    Person(s) Educated  Patient    Methods  Explanation;Demonstration;Verbal cues    Comprehension  Verbalized understanding;Returned demonstration       PT Short Term Goals - 11/14/17 0848      PT  SHORT TERM GOAL #1   Title  Pt will be able to perform sit <> stand transfer from a regular chair independently without UE assist to promote function.     Baseline  needs UE support    Time  4    Period  Weeks    Status  New    Target Date  12/11/17      PT SHORT TERM GOAL #2   Title  Patient will be able to stand at counter for 15 minutes to be able to complete a standing activity    Baseline  Patient has limited standing time with UE support and AD    Time  4    Period  Weeks    Status  New    Target Date  12/11/17        PT Long Term Goals - 12/27/17 1516      PT LONG TERM GOAL #1   Title  Patient will be independent in home exercise program to improve strength/mobility for better functional independence with ADLs    Baseline  no HEP    Time  8    Period  Weeks    Status  On-going    Target Date  01/08/18      PT LONG TERM GOAL #2   Title  Patient will ascend/descend 4 stairs with rail assist independently and ability to bring his rollator to the top or bottom of the steps , without loss of balance to improve ability to get in/out of home    Baseline  Patient reports difficulty with getting his assistive device up and down the steps and has had a fall trying to perform this activity    Time  8    Period  Weeks    Status  On-going    Target Date  01/08/18      PT LONG TERM GOAL #3   Title  Patient will increase 10 meter walk test to >1.51m/s as to improve gait speed for better community ambulation and to reduce fall risk.    Baseline  1.31 m/sec    Time  8    Period  Weeks    Status  On-going    Target Date  01/08/18      PT LONG TERM GOAL #4   Title  Patient (> 16 years old) will complete five times sit to stand test in < 15 seconds indicating an increased LE strength and improved balance.    Baseline  18.22 sec    Time  8    Period  Weeks    Status  On-going    Target Date  01/08/18      PT LONG TERM GOAL #5   Title  Patient will ambulate 50 feet indoors with  spc without falls or using the furniture for support.    Baseline  spc    Time  8    Period  Weeks    Status  On-going    Target Date  01/08/18            Plan - 01/04/18 1526    Clinical Impression Statement  Patient continues to have tight hamstring and tight calf muscles and his stretching was reviewed. He is able to perfording balance and strengthening to LE's and core without pain behaviors. He is having pain in right foot in the arch. He will conitnue to benefit from skilled PT to improve strength and balance.     Rehab Potential  Good    Clinical Impairments Affecting Rehab Potential  chronicity of condition, Parkinson's disease, TKR BLE    PT Frequency  2x / week    PT Duration  8 weeks    PT Treatment/Interventions  Neuromuscular re-education;Manual techniques;Therapeutic exercise;Therapeutic activities;Aquatic Therapy;Gait training;Patient/family education;Cryotherapy;Stair training;Balance training;Dry needling;Taping    PT Next Visit Plan  Knee extension ROM, gait, functional strengthening, balance training    PT Home Exercise Plan  needs HEP next visit, 6 MW test, TUG test,     Consulted and Agree with Plan of Care  Patient       Patient will benefit from skilled therapeutic intervention in order to improve the following deficits and impairments:  Pain, Postural dysfunction, Improper body mechanics, Difficulty walking, Decreased strength, Decreased range of motion, Abnormal gait, Decreased balance, Decreased endurance, Decreased mobility, Impaired sensation, Decreased activity tolerance, Impaired flexibility  Visit Diagnosis: Muscle weakness (generalized)  Difficulty in walking, not elsewhere classified  Chronic pain of right knee     Problem List Patient Active Problem List   Diagnosis Date Noted  . Moderate recurrent major depression ([place]) 08/15/2016  . Chest pain due to myocardial ischemia 08/14/2016  . Chest pain, rule out acute myocardial infarction  08/14/2016    Alanson Puls, [place] DPT 01/04/2018, 3:32 PM  [place] MAIN [facility] SERVICES [address] [place], [place], [address] Phone: [phone]   Fax:  [phone]  Name: Charles Choi MRN: [id number] Date of Birth: 1950-11-22

## 2018-01-09 ENCOUNTER — Ambulatory Visit: Payer: Medicare Other | Admitting: Physical Therapy

## 2018-01-11 ENCOUNTER — Ambulatory Visit: Payer: Medicare Other | Admitting: Physical Therapy

## 2018-01-15 ENCOUNTER — Ambulatory Visit: Payer: Medicare Other | Attending: Internal Medicine | Admitting: Physical Therapy

## 2018-01-15 ENCOUNTER — Encounter: Payer: Self-pay | Admitting: Physical Therapy

## 2018-01-15 DIAGNOSIS — M6281 Muscle weakness (generalized): Secondary | ICD-10-CM | POA: Insufficient documentation

## 2018-01-15 DIAGNOSIS — M25561 Pain in right knee: Secondary | ICD-10-CM | POA: Diagnosis present

## 2018-01-15 DIAGNOSIS — G8929 Other chronic pain: Secondary | ICD-10-CM | POA: Diagnosis present

## 2018-01-15 DIAGNOSIS — R262 Difficulty in walking, not elsewhere classified: Secondary | ICD-10-CM | POA: Diagnosis present

## 2018-01-15 NOTE — Therapy (Signed)
[place] MAIN [facility] SERVICES [address] [place], [place], [address] Phone: [phone]   Fax:  [phone]  Physical Therapy Treatment  Patient Details  Name: Charles Choi MRN: [id number] Date of Birth: 1951/01/30 Referring Provider: Fulton Reek D    Encounter Date: 01/15/2018  PT End of Session - 01/15/18 1530    Visit Number  13    Number of Visits  17    Date for PT Re-Evaluation  01/08/18    PT Start Time  0315    PT Stop Time  0400    PT Time Calculation (min)  45 min    Equipment Utilized During Treatment  Gait belt    Activity Tolerance  Patient limited by fatigue    Behavior During Therapy  [facility] for tasks assessed/performed       Past Medical History:  Diagnosis Date  . Anxiety   . Cancer ([place])   . Depression   . Hypertension   . Parkinson's disease [facility])     Past Surgical History:  Procedure Laterality Date  . BACK SURGERY    . CHOLECYSTECTOMY    . DEEP BRAIN STIMULATOR PLACEMENT     for Parkinson's disease  . JOINT REPLACEMENT     left knee x2    There were no vitals filed for this visit.  Subjective Assessment - 01/15/18 1527    Subjective  Patient reports that he had a Dr. appointment for his right foot pain .     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  Had PT at [facility] for 7 days.  Used the bike 6-10 minutes, as well as worked on walking, ROM for his R knee.  Started walking with a rw and is currently using a rollator walker.  Pt also adds that he was in a wc since October 2017 until the surgery due to pain. Patient has been on antiobiotic now due to the left knee being infected. The MD will test him in april to see if he continues to have an infection and if he needs to have any more surgery on the left knee. Patients baseline for ambulation is that he was able to ambulate with a spc outdoors. He had PT in July for strengthening to be able to walk  with a cane instead of the rollator.     Limitations  Walking;Standing    Patient Stated Goals  I want to walk without a walker, improve endurance.    Currently in Pain?  No/denies    Pain Score  0-No pain    Pain Onset  More than a month ago    Multiple Pain Sites  No       Treatment:  Nu-step warm up x 5 mins  Step fwd/bwd in parallel bars left LE x 10  Step fwd RLE , step bwd RLE  X 10 each way  Leg press 120 lbs x 20 x 2   10 MW and 5 x sit to stand performed ; patient had difficulty today with motor planning and used the last scores to update his goals  sidelying hip abd x 20 left and right LE  Single leg bridge ; too difficult and caused LE cramps  Bridges x 15 x 2 ;cues for correct form  PT Education - 01/15/18 1527    Education provided  Yes    Education Details  HEP    Person(s) Educated  Patient    Methods  Explanation;Demonstration;Tactile cues    Comprehension  Verbalized understanding;Returned demonstration       PT Short Term Goals - 11/14/17 0848      PT SHORT TERM GOAL #1   Title  Pt will be able to perform sit <> stand transfer from a regular chair independently without UE assist to promote function.     Baseline  needs UE support    Time  4    Period  Weeks    Status  New    Target Date  12/11/17      PT SHORT TERM GOAL #2   Title  Patient will be able to stand at counter for 15 minutes to be able to complete a standing activity    Baseline  Patient has limited standing time with UE support and AD    Time  4    Period  Weeks    Status  New    Target Date  12/11/17        PT Long Term Goals - 01/15/18 1533      PT LONG TERM GOAL #1   Title  Patient will be independent in home exercise program to improve strength/mobility for better functional independence with ADLs    Baseline  no HEP    Time  8    Period  Weeks    Status  On-going    Target Date  03/05/18      PT LONG TERM GOAL #2    Title  Patient will ascend/descend 4 stairs with rail assist independently and ability to bring his rollator to the top or bottom of the steps , without loss of balance to improve ability to get in/out of home    Baseline  Patient reports difficulty with getting his assistive device up and down the steps and has had a fall trying to perform this activity    Time  8    Period  Weeks    Status  On-going    Target Date  03/05/18      PT LONG TERM GOAL #3   Title  Patient will increase 10 meter walk test to >1.51m/s as to improve gait speed for better community ambulation and to reduce fall risk.    Baseline  1.31 m/sec ;01/15/18= 1.31 m/sec    Time  8    Period  Weeks    Status  On-going    Target Date  03/05/18      PT LONG TERM GOAL #4   Title  Patient (> 53 years old) will complete five times sit to stand test in < 15 seconds indicating an increased LE strength and improved balance.    Baseline  18.22 sec,  01/15/18=  18.22    Time  8    Period  Weeks    Status  On-going    Target Date  03/05/18      PT LONG TERM GOAL #5   Title  Patient will ambulate 50 feet indoors with spc without falls or using the furniture for support.    Baseline  spc 50% of the time indoors    Time  8    Period  Weeks    Status  On-going    Target Date  03/05/18  Plan - 01/15/18 1545    Clinical Impression Statement  Patient goals were reviewed today and recommended to continue skilled PT to improve gait and decrease his falls risk. Patient instructed in advanced LE strengthening exercise. Advanced HEP with seated and standing exercise. Patient requires min VCs for correct positioning and to slow down LE movement for better strengthening; Patient performs dynamic standing balance training exercises with advanced challenges.. Patient would benefit from additional skilled PT intervention to improve balance/gait safety and reduce fall risk.    Rehab Potential  Good    Clinical Impairments  Affecting Rehab Potential  chronicity of condition, Parkinson's disease, TKR BLE    PT Frequency  2x / week    PT Duration  8 weeks    PT Treatment/Interventions  Neuromuscular re-education;Manual techniques;Therapeutic exercise;Therapeutic activities;Aquatic Therapy;Gait training;Patient/family education;Cryotherapy;Stair training;Balance training;Dry needling;Taping    PT Next Visit Plan  Knee extension ROM, gait, functional strengthening, balance training    PT Home Exercise Plan  needs HEP next visit, 6 MW test, TUG test,     Consulted and Agree with Plan of Care  Patient       Patient will benefit from skilled therapeutic intervention in order to improve the following deficits and impairments:  Pain, Postural dysfunction, Improper body mechanics, Difficulty walking, Decreased strength, Decreased range of motion, Abnormal gait, Decreased balance, Decreased endurance, Decreased mobility, Impaired sensation, Decreased activity tolerance, Impaired flexibility  Visit Diagnosis: Muscle weakness (generalized)  Difficulty in walking, not elsewhere classified  Chronic pain of right knee     Problem List Patient Active Problem List   Diagnosis Date Noted  . Moderate recurrent major depression ([place]) 08/15/2016  . Chest pain due to myocardial ischemia 08/14/2016  . Chest pain, rule out acute myocardial infarction 08/14/2016    Alanson Puls, [place] DPT 01/15/2018, 3:46 PM  [place] MAIN [facility] SERVICES [address] [place], [place], [address] Phone: [phone]   Fax:  [phone]  Name: Charles Choi MRN: [id number] Date of Birth: 29-Jul-1951

## 2018-01-17 ENCOUNTER — Ambulatory Visit: Payer: Medicare Other | Admitting: Physical Therapy

## 2018-01-17 ENCOUNTER — Encounter: Payer: Self-pay | Admitting: Physical Therapy

## 2018-01-17 DIAGNOSIS — M6281 Muscle weakness (generalized): Secondary | ICD-10-CM | POA: Diagnosis not present

## 2018-01-17 DIAGNOSIS — M25561 Pain in right knee: Secondary | ICD-10-CM

## 2018-01-17 DIAGNOSIS — G8929 Other chronic pain: Secondary | ICD-10-CM

## 2018-01-17 DIAGNOSIS — R262 Difficulty in walking, not elsewhere classified: Secondary | ICD-10-CM

## 2018-01-17 NOTE — Therapy (Signed)
[place] MAIN [facility] SERVICES [address] [place], [place], [address] Phone: [phone]   Fax:  [phone]  Physical Therapy Treatment  Patient Details  Name: Charles Choi MRN: [id number] Date of Birth: 1951/04/20 Referring Provider: Fulton Reek D    Encounter Date: 01/17/2018  PT End of Session - 01/17/18 1641    Visit Number  14    Number of Visits  17    Date for PT Re-Evaluation  01/08/18    PT Start Time  0320    PT Stop Time  0400    PT Time Calculation (min)  40 min    Equipment Utilized During Treatment  Gait belt    Activity Tolerance  Patient limited by fatigue    Behavior During Therapy  [facility] for tasks assessed/performed       Past Medical History:  Diagnosis Date  . Anxiety   . Cancer ([place])   . Depression   . Hypertension   . Parkinson's disease [facility])     Past Surgical History:  Procedure Laterality Date  . BACK SURGERY    . CHOLECYSTECTOMY    . DEEP BRAIN STIMULATOR PLACEMENT     for Parkinson's disease  . JOINT REPLACEMENT     left knee x2    There were no vitals filed for this visit.  Subjective Assessment - 01/17/18 1612    Subjective  Patient is doing well and reports that he is seeing the Dr. for his right foot pain in 2 weeks and he is seeing his MD for his shoulder the end of May.     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  Had PT at [facility] for 7 days.  Used the bike 6-10 minutes, as well as worked on walking, ROM for his R knee.  Started walking with a rw and is currently using a rollator walker.  Pt also adds that he was in a wc since October 2017 until the surgery due to pain. Patient has been on antiobiotic now due to the left knee being infected. The MD will test him in april to see if he continues to have an infection and if he needs to have any more surgery on the left knee. Patients baseline for ambulation is that he was able to  ambulate with a spc outdoors. He had PT in July for strengthening to be able to walk with a cane instead of the rollator.     Limitations  Walking;Standing    Patient Stated Goals  I want to walk without a walker, improve endurance.    Currently in Pain?  Yes    Pain Score  3     Pain Location  Foot    Pain Orientation  Right    Pain Descriptors / Indicators  Aching    Pain Type  Chronic pain    Pain Onset  More than a month ago    Pain Frequency  Constant    Aggravating Factors   with activity    Pain Relieving Factors  resting     Effect of Pain on Daily Activities  not able to walk            Standing on airex: Modified tandem stance: BUE ball pass side/side x5 reps each direction with CGA for safety and cues to keep both hands on ball for better trunk rotation and balance challenge; Patient would start to  loose balance and then would grab for the rail; Instructed patient to improve upper trunk control for less loss of balance;      Exercise: Sit<>Stand from low mat table with small ball in hands, 2x5 with cues to increase push through LE for better LE strengthening;Patient had increased difficulty with increased repetition;    Leg press: BLE plate 90# 7W29 with cues for positioning to improve LE strengthening and motor control;    Sitting: Hamstring curl, green tband x10 bilaterally with min Vcs to increase ROM with knee flexion for better strengthening;  Warm up on Nustep BUE/BLE level 2 x5 min (Unbilled);    Patient required min-moderate verbal/tactile cues for correct exercise technique including cues to improve posture, improve foot position, and reduce rail assist for better balance challenge; Patient often distracted and requires increased cues for correct activity;                    PT Education - 01/17/18 1640    Education provided  Yes    Education Details  safety with ambulation with spc    Person(s) Educated  Patient    Methods  Explanation     Comprehension  Verbalized understanding       PT Short Term Goals - 11/14/17 0848      PT SHORT TERM GOAL #1   Title  Pt will be able to perform sit <> stand transfer from a regular chair independently without UE assist to promote function.     Baseline  needs UE support    Time  4    Period  Weeks    Status  New    Target Date  12/11/17      PT SHORT TERM GOAL #2   Title  Patient will be able to stand at counter for 15 minutes to be able to complete a standing activity    Baseline  Patient has limited standing time with UE support and AD    Time  4    Period  Weeks    Status  New    Target Date  12/11/17        PT Long Term Goals - 01/15/18 1533      PT LONG TERM GOAL #1   Title  Patient will be independent in home exercise program to improve strength/mobility for better functional independence with ADLs    Baseline  no HEP    Time  8    Period  Weeks    Status  On-going    Target Date  03/05/18      PT LONG TERM GOAL #2   Title  Patient will ascend/descend 4 stairs with rail assist independently and ability to bring his rollator to the top or bottom of the steps , without loss of balance to improve ability to get in/out of home    Baseline  Patient reports difficulty with getting his assistive device up and down the steps and has had a fall trying to perform this activity    Time  8    Period  Weeks    Status  On-going    Target Date  03/05/18      PT LONG TERM GOAL #3   Title  Patient will increase 10 meter walk test to >1.8m/s as to improve gait speed for better community ambulation and to reduce fall risk.    Baseline  1.31 m/sec ;01/15/18= 1.31 m/sec    Time  8  Period  Weeks    Status  On-going    Target Date  03/05/18      PT LONG TERM GOAL #4   Title  Patient (> 21 years old) will complete five times sit to stand test in < 15 seconds indicating an increased LE strength and improved balance.    Baseline  18.22 sec,  01/15/18=  18.22    Time  8     Period  Weeks    Status  On-going    Target Date  03/05/18      PT LONG TERM GOAL #5   Title  Patient will ambulate 50 feet indoors with spc without falls or using the furniture for support.    Baseline  spc 50% of the time indoors    Time  8    Period  Weeks    Status  On-going    Target Date  03/05/18            Plan - 01/17/18 1641    Clinical Impression Statement  Patient instructed in advanced strengthening and balance exercise.  Patient requires min Vcs for correct exercise technique including to improve trunk control with standing exercise. Patient demonstrates better quad control with SLS tasks with rail assist. Patient would benefit from additional skilled PT intervention to improve balance/gait safety and reduce fall risk;    Rehab Potential  Good    Clinical Impairments Affecting Rehab Potential  chronicity of condition, Parkinson's disease, TKR BLE    PT Frequency  2x / week    PT Duration  8 weeks    PT Treatment/Interventions  Neuromuscular re-education;Manual techniques;Therapeutic exercise;Therapeutic activities;Aquatic Therapy;Gait training;Patient/family education;Cryotherapy;Stair training;Balance training;Dry needling;Taping    PT Next Visit Plan  Knee extension ROM, gait, functional strengthening, balance training    PT Home Exercise Plan  needs HEP next visit, 6 MW test, TUG test,     Consulted and Agree with Plan of Care  Patient       Patient will benefit from skilled therapeutic intervention in order to improve the following deficits and impairments:  Pain, Postural dysfunction, Improper body mechanics, Difficulty walking, Decreased strength, Decreased range of motion, Abnormal gait, Decreased balance, Decreased endurance, Decreased mobility, Impaired sensation, Decreased activity tolerance, Impaired flexibility  Visit Diagnosis: Muscle weakness (generalized)  Difficulty in walking, not elsewhere classified  Chronic pain of right knee     Problem  List Patient Active Problem List   Diagnosis Date Noted  . Moderate recurrent major depression ([place]) 08/15/2016  . Chest pain due to myocardial ischemia 08/14/2016  . Chest pain, rule out acute myocardial infarction 08/14/2016    Alanson Puls, [place] DPT 01/17/2018, 4:46 PM  [place] MAIN [facility] SERVICES [address] [place], [place], [address] Phone: [phone]   Fax:  [phone]  Name: Charles Choi MRN: [id number] Date of Birth: 25-Dec-1950

## 2018-01-22 ENCOUNTER — Ambulatory Visit: Payer: Medicare Other | Admitting: Physical Therapy

## 2018-01-22 ENCOUNTER — Encounter: Payer: Self-pay | Admitting: Physical Therapy

## 2018-01-22 DIAGNOSIS — M6281 Muscle weakness (generalized): Secondary | ICD-10-CM | POA: Diagnosis not present

## 2018-01-22 DIAGNOSIS — G8929 Other chronic pain: Secondary | ICD-10-CM

## 2018-01-22 DIAGNOSIS — M25561 Pain in right knee: Secondary | ICD-10-CM

## 2018-01-22 DIAGNOSIS — R262 Difficulty in walking, not elsewhere classified: Secondary | ICD-10-CM

## 2018-01-22 NOTE — Therapy (Signed)
[place] MAIN [facility] SERVICES [address] [place], [place], [address] Phone: [phone]   Fax:  [phone]  Physical Therapy Treatment  Patient Details  Name: NOX TALENT MRN: [id number] Date of Birth: 1951-02-13 Referring Provider: Fulton Reek D    Encounter Date: 01/22/2018  PT End of Session - 01/22/18 1521    Visit Number  15    Number of Visits  17    Date for PT Re-Evaluation  01/08/18    PT Start Time  0315    PT Stop Time  0400    PT Time Calculation (min)  45 min    Equipment Utilized During Treatment  Gait belt    Activity Tolerance  Patient limited by fatigue    Behavior During Therapy  [facility] for tasks assessed/performed       Past Medical History:  Diagnosis Date  . Anxiety   . Cancer ([place])   . Depression   . Hypertension   . Parkinson's disease [facility])     Past Surgical History:  Procedure Laterality Date  . BACK SURGERY    . CHOLECYSTECTOMY    . DEEP BRAIN STIMULATOR PLACEMENT     for Parkinson's disease  . JOINT REPLACEMENT     left knee x2    There were no vitals filed for this visit.  Subjective Assessment - 01/22/18 1519    Subjective  Patient is doing well and reports that he is seeing the Dr. for his right foot pain in 2 weeks and he is seeing his MD for his shoulder the end of May.     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  Had PT at [facility] for 7 days.  Used the bike 6-10 minutes, as well as worked on walking, ROM for his R knee.  Started walking with a rw and is currently using a rollator walker.  Pt also adds that he was in a wc since October 2017 until the surgery due to pain. Patient has been on antiobiotic now due to the left knee being infected. The MD will test him in april to see if he continues to have an infection and if he needs to have any more surgery on the left knee. Patients baseline for ambulation is that he was able to  ambulate with a spc outdoors. He had PT in July for strengthening to be able to walk with a cane instead of the rollator.     Limitations  Walking;Standing    Patient Stated Goals  I want to walk without a walker, improve endurance.    Currently in Pain?  Yes    Pain Score  3     Pain Location  Shoulder    Pain Orientation  Left    Pain Descriptors / Indicators  Aching    Pain Type  Chronic pain    Pain Onset  More than a month ago    Pain Frequency  Constant    Aggravating Factors   with activity    Pain Relieving Factors  resting    Effect of Pain on Daily Activities  not able      Treatment: Patient continues to demonstrates less incoordination of movement with select exercises such as rock and reach and stepping backwards. Patient responds well to verbal and tactile cues to correct form and technique. Patient is able to catch mistakes in technique with incorrect positions. Motor control  of LE much improved.  Muscle fatigue but no major pain complaints.   Stepping fwd past opposite foot and stepping backwards behind opposite foot x 10 x 2 each LE  Step up to 5 inch stool and down the opposite side x 8 reps, alternating LE    Standing L knee with terminal knee extension x 10 with 10 sec hold   Leg press with 75 lbs x 20 x 3   Leg press hell raises with knees straight and heel raises x 20 x 2   Patients ambulation is improving and his knee is getting stronger and  ROM is improving with knee extension .                      PT Education - 01/22/18 1520    Education provided  Yes    Education Details  progresed hEP    Person(s) Educated  Patient    Methods  Explanation;Demonstration;Tactile cues;Verbal cues    Comprehension  Verbalized understanding;Returned demonstration;Verbal cues required       PT Short Term Goals - 11/14/17 0848      PT SHORT TERM GOAL #1   Title  Pt will be able to perform sit <> stand transfer from a regular chair independently  without UE assist to promote function.     Baseline  needs UE support    Time  4    Period  Weeks    Status  New    Target Date  12/11/17      PT SHORT TERM GOAL #2   Title  Patient will be able to stand at counter for 15 minutes to be able to complete a standing activity    Baseline  Patient has limited standing time with UE support and AD    Time  4    Period  Weeks    Status  New    Target Date  12/11/17        PT Long Term Goals - 01/15/18 1533      PT LONG TERM GOAL #1   Title  Patient will be independent in home exercise program to improve strength/mobility for better functional independence with ADLs    Baseline  no HEP    Time  8    Period  Weeks    Status  On-going    Target Date  03/05/18      PT LONG TERM GOAL #2   Title  Patient will ascend/descend 4 stairs with rail assist independently and ability to bring his rollator to the top or bottom of the steps , without loss of balance to improve ability to get in/out of home    Baseline  Patient reports difficulty with getting his assistive device up and down the steps and has had a fall trying to perform this activity    Time  8    Period  Weeks    Status  On-going    Target Date  03/05/18      PT LONG TERM GOAL #3   Title  Patient will increase 10 meter walk test to >1.44m/s as to improve gait speed for better community ambulation and to reduce fall risk.    Baseline  1.31 m/sec ;01/15/18= 1.31 m/sec    Time  8    Period  Weeks    Status  On-going    Target Date  03/05/18      PT LONG TERM GOAL #4   Title  Patient (> 38 years old) will complete five times sit to stand test in < 15 seconds indicating an increased LE strength and improved balance.    Baseline  18.22 sec,  01/15/18=  18.22    Time  8    Period  Weeks    Status  On-going    Target Date  03/05/18      PT LONG TERM GOAL #5   Title  Patient will ambulate 50 feet indoors with spc without falls or using the furniture for support.    Baseline  spc  50% of the time indoors    Time  8    Period  Weeks    Status  On-going    Target Date  03/05/18            Plan - 01/22/18 1522    Clinical Impression Statement  Patient demonstrates quad and hip abd weakness, and impaired balance and lack of full knee extension that is causing difficulty with walking and mobility activities.  Patient still fatigues quickly to due very weak LE's.  Patient showed poor LE stepping  technique and is able to generate good form after heavy cueing. Patient will continue to benefit from skilled physical therapy to improve LE strength and dynamic balance to decrease risk of falls    Rehab Potential  Good    Clinical Impairments Affecting Rehab Potential  chronicity of condition, Parkinson's disease, TKR BLE    PT Frequency  2x / week    PT Duration  8 weeks    PT Treatment/Interventions  Neuromuscular re-education;Manual techniques;Therapeutic exercise;Therapeutic activities;Aquatic Therapy;Gait training;Patient/family education;Cryotherapy;Stair training;Balance training;Dry needling;Taping    PT Next Visit Plan  Knee extension ROM, gait, functional strengthening, balance training    PT Home Exercise Plan  needs HEP next visit, 6 MW test, TUG test,     Consulted and Agree with Plan of Care  Patient       Patient will benefit from skilled therapeutic intervention in order to improve the following deficits and impairments:  Pain, Postural dysfunction, Improper body mechanics, Difficulty walking, Decreased strength, Decreased range of motion, Abnormal gait, Decreased balance, Decreased endurance, Decreased mobility, Impaired sensation, Decreased activity tolerance, Impaired flexibility  Visit Diagnosis: Muscle weakness (generalized)  Difficulty in walking, not elsewhere classified  Chronic pain of right knee     Problem List Patient Active Problem List   Diagnosis Date Noted  . Moderate recurrent major depression ([place]) 08/15/2016  . Chest pain due to  myocardial ischemia 08/14/2016  . Chest pain, rule out acute myocardial infarction 08/14/2016    [place], Minette Headland S . PT DPT 01/22/2018, 3:24 PM  [place] MAIN [facility] SERVICES [address] [place], [place], [address] Phone: [phone]   Fax:  [phone]  Name: ARTEMIO DOBIE MRN: [id number] Date of Birth: Jul 19, 1951

## 2018-01-24 ENCOUNTER — Encounter: Payer: Self-pay | Admitting: Physical Therapy

## 2018-01-24 ENCOUNTER — Ambulatory Visit: Payer: Medicare Other | Admitting: Physical Therapy

## 2018-01-24 DIAGNOSIS — M25561 Pain in right knee: Secondary | ICD-10-CM

## 2018-01-24 DIAGNOSIS — R262 Difficulty in walking, not elsewhere classified: Secondary | ICD-10-CM

## 2018-01-24 DIAGNOSIS — G8929 Other chronic pain: Secondary | ICD-10-CM

## 2018-01-24 DIAGNOSIS — M6281 Muscle weakness (generalized): Secondary | ICD-10-CM

## 2018-01-24 NOTE — Therapy (Addendum)
[place] MAIN [facility] SERVICES [address] [place], [place], [address] Phone: [phone]   Fax:  [phone]  Physical Therapy Treatment  Patient Details  Name: Charles Choi MRN: [id number] Date of Birth: June 06, 1951 Referring Provider: Fulton Reek D    Encounter Date: 01/24/2018  PT End of Session - 01/24/18 1535    Visit Number  16    Number of Visits  33    Date for PT Re-Evaluation  03/05/18    PT Start Time  0315    PT Stop Time  0400    PT Time Calculation (min)  45 min    Equipment Utilized During Treatment  Gait belt    Activity Tolerance  Patient limited by fatigue    Behavior During Therapy  [facility] for tasks assessed/performed       Past Medical History:  Diagnosis Date  . Anxiety   . Cancer ([place])   . Depression   . Hypertension   . Parkinson's disease [facility])     Past Surgical History:  Procedure Laterality Date  . BACK SURGERY    . CHOLECYSTECTOMY    . DEEP BRAIN STIMULATOR PLACEMENT     for Parkinson's disease  . JOINT REPLACEMENT     left knee x2    There were no vitals filed for this visit.  Subjective Assessment - 01/24/18 1535    Subjective  Patient is doing well and reports that he is seeing the Dr. for his right foot pain in 2 weeks and he is seeing his MD for his shoulder the end of May. no new concerns.    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  Had PT at [facility] for 7 days.  Used the bike 6-10 minutes, as well as worked on walking, ROM for his R knee.  Started walking with a rw and is currently using a rollator walker.  Pt also adds that he was in a wc since October 2017 until the surgery due to pain. Patient has been on antiobiotic now due to the left knee being infected. The MD will test him in april to see if he continues to have an infection and if he needs to have any more surgery on the left knee. Patients baseline for ambulation is that he  was able to ambulate with a spc outdoors. He had PT in July for strengthening to be able to walk with a cane instead of the rollator.     Limitations  Walking;Standing    Patient Stated Goals  I want to walk without a walker, improve endurance.    Currently in Pain?  Yes    Pain Score  3     Pain Location  Shoulder    Pain Orientation  Left    Pain Descriptors / Indicators  Aching    Pain Type  Chronic pain    Pain Onset  More than a month ago    Pain Frequency  Constant    Aggravating Factors   with activity    Pain Relieving Factors  resting    Effect of Pain on Daily Activities  not able    Multiple Pain Sites  No      Therapeutic exercise: Step ups to 6 inch stool up and then over the other side of the stool and then back step back to stool and repeat x 10   Rocker board fwd/ bwd, side  to side x 20 taps each way and CGA  Standing with red therball under knee , pushing to wall 10 sec on , 5 sec off x 10 BLE  TM walking x 10 mins , heel toe gait and knee squeeze x ten mins . 7 miles / hour  CGA for all activities and min vc for correct technique                      PT Education - 01/24/18 1543    Education provided  Yes    Education Details  theraball with knee extension in standing, importance of stretching    Person(s) Educated  Patient    Methods  Explanation;Demonstration;Tactile cues    Comprehension  Verbalized understanding;Returned demonstration       PT Short Term Goals - 11/14/17 0848      PT SHORT TERM GOAL #1   Title  Pt will be able to perform sit <> stand transfer from a regular chair independently without UE assist to promote function.     Baseline  needs UE support    Time  4    Period  Weeks    Status  New    Target Date  12/11/17      PT SHORT TERM GOAL #2   Title  Patient will be able to stand at counter for 15 minutes to be able to complete a standing activity    Baseline  Patient has limited standing time with UE support and  AD    Time  4    Period  Weeks    Status  New    Target Date  12/11/17        PT Long Term Goals - 01/15/18 1533      PT LONG TERM GOAL #1   Title  Patient will be independent in home exercise program to improve strength/mobility for better functional independence with ADLs    Baseline  no HEP    Time  8    Period  Weeks    Status  On-going    Target Date  03/05/18      PT LONG TERM GOAL #2   Title  Patient will ascend/descend 4 stairs with rail assist independently and ability to bring his rollator to the top or bottom of the steps , without loss of balance to improve ability to get in/out of home    Baseline  Patient reports difficulty with getting his assistive device up and down the steps and has had a fall trying to perform this activity    Time  8    Period  Weeks    Status  On-going    Target Date  03/05/18      PT LONG TERM GOAL #3   Title  Patient will increase 10 meter walk test to >1.25m/s as to improve gait speed for better community ambulation and to reduce fall risk.    Baseline  1.31 m/sec ;01/15/18= 1.31 m/sec    Time  8    Period  Weeks    Status  On-going    Target Date  03/05/18      PT LONG TERM GOAL #4   Title  Patient (> 32 years old) will complete five times sit to stand test in < 15 seconds indicating an increased LE strength and improved balance.    Baseline  18.22 sec,  01/15/18=  18.22    Time  8    Period  Weeks    Status  On-going    Target Date  03/05/18      PT LONG TERM GOAL #5   Title  Patient will ambulate 50 feet indoors with spc without falls or using the furniture for support.    Baseline  spc 50% of the time indoors    Time  8    Period  Weeks    Status  On-going    Target Date  03/05/18            Plan - 01/24/18 1536    Clinical Impression Statement  Patient demonstrates decreased gait speed with deviations and requires verbal and tactile cueing to maintain center of gravity during ambulation. Patient also requires CGA  during all dynamic standing balance activities. Patient requires consistent cueing to maintain correct position during gait activities . Patient demonstrates improvement with gait and better motor control  while on TM. Patient will continue to benefit from skilled therapy in order to improve dynamic standing balance and increase endurance    Rehab Potential  Good    Clinical Impairments Affecting Rehab Potential  chronicity of condition, Parkinson's disease, TKR BLE    PT Frequency  2x / week    PT Duration  8 weeks    PT Treatment/Interventions  Neuromuscular re-education;Manual techniques;Therapeutic exercise;Therapeutic activities;Aquatic Therapy;Gait training;Patient/family education;Cryotherapy;Stair training;Balance training;Dry needling;Taping    PT Next Visit Plan  Knee extension ROM, gait, functional strengthening, balance training    PT Home Exercise Plan  needs HEP next visit, 6 MW test, TUG test,     Consulted and Agree with Plan of Care  Patient       Patient will benefit from skilled therapeutic intervention in order to improve the following deficits and impairments:  Pain, Postural dysfunction, Improper body mechanics, Difficulty walking, Decreased strength, Decreased range of motion, Abnormal gait, Decreased balance, Decreased endurance, Decreased mobility, Impaired sensation, Decreased activity tolerance, Impaired flexibility  Visit Diagnosis: Muscle weakness (generalized)  Difficulty in walking, not elsewhere classified  Chronic pain of right knee     Problem List Patient Active Problem List   Diagnosis Date Noted  . Moderate recurrent major depression ([place]) 08/15/2016  . Chest pain due to myocardial ischemia 08/14/2016  . Chest pain, rule out acute myocardial infarction 08/14/2016    Alanson Puls, [place] DPT 01/25/2018, 2:21 PM  [place] MAIN [facility] SERVICES [address] [place], [place], [address] Phone: [phone]    Fax:  [phone]  Name: RASAAN BROTHERTON MRN: [id number] Date of Birth: 1951/08/03

## 2018-01-29 ENCOUNTER — Ambulatory Visit: Payer: Medicare Other

## 2018-01-29 VITALS — BP 148/62 | HR 101

## 2018-01-29 DIAGNOSIS — M6281 Muscle weakness (generalized): Secondary | ICD-10-CM

## 2018-01-29 DIAGNOSIS — R262 Difficulty in walking, not elsewhere classified: Secondary | ICD-10-CM

## 2018-01-29 NOTE — Therapy (Signed)
[place] MAIN [facility] SERVICES [address] [place], [place], [address] Phone: [phone]   Fax:  [phone]  Physical Therapy Treatment  Patient Details  Name: Charles Choi MRN: [id number] Date of Birth: [birth year] Referring Provider: Fulton Reek D    Encounter Date: 01/29/2018  PT End of Session - 01/29/18 1538    Visit Number  17    Number of Visits  33    Date for PT Re-Evaluation  03/05/18    PT Start Time  1520    PT Stop Time  1600    PT Time Calculation (min)  40 min    Equipment Utilized During Treatment  Gait belt    Activity Tolerance  Patient limited by fatigue    Behavior During Therapy  [facility] for tasks assessed/performed       Past Medical History:  Diagnosis Date  . Anxiety   . Cancer ([place])   . Depression   . Hypertension   . Parkinson's disease [facility])     Past Surgical History:  Procedure Laterality Date  . BACK SURGERY    . CHOLECYSTECTOMY    . DEEP BRAIN STIMULATOR PLACEMENT     for Parkinson's disease  . JOINT REPLACEMENT     left knee x2    Vitals:   01/29/18 1525  BP: (!) 148/62  Pulse: (!) 101  SpO2: 100%    Subjective Assessment - 01/29/18 1519    Subjective  Pt reports that he is having some difficulty with walking today. He had his appointment with ID at [facility] this morning and they stated that everything was going well. No current plans to replace hardware. He is seeing Dr. Maeser Cellar (orthopedic surgeon) about his R foot. No specific questions or concerns at this time.     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  Had PT at [facility] for 7 days.  Used the bike 6-10 minutes, as well as worked on walking, ROM for his R knee.  Started walking with a rw and is currently using a rollator walker.  Pt also adds that he was in a wc since October 2017 until the surgery due to pain. Patient has been on antiobiotic now due to the left knee being  infected. The MD will test him in april to see if he continues to have an infection and if he needs to have any more surgery on the left knee. Patients baseline for ambulation is that he was able to ambulate with a spc outdoors. He had PT in July for strengthening to be able to walk with a cane instead of the rollator.     Limitations  Walking;Standing    Patient Stated Goals  I want to walk without a walker, improve endurance.    Currently in Pain?  No/denies    Pain Onset  --                No data recorded   TREATMENT   Ther-ex NuStep L3 x 5 minutes for warm-up during history; Leg press BLE plate120# x 15, 135# x 15 Step-ups to 6 inch stool alternating LE x 10 each side; Toe taps to 6 inch stool alternating LE x 10 each side; Rockerboard A/P and R/L orientation with static balance and weight shifting;  Patient required min-moderate verbal/tactile cues for correct exercise techniqueincluding cues to improve posture, improve foot position, and reduce rail assist for  better balance challenge; Patient often distracted and requires increased cues for correct activity;           PT Education - 01/29/18 1538    Education provided  Yes    Education Details  exercise form/technique    Person(s) Educated  Patient    Methods  Explanation    Comprehension  Verbalized understanding       PT Short Term Goals - 11/14/17 0848      PT SHORT TERM GOAL #1   Title  Pt will be able to perform sit <> stand transfer from a regular chair independently without UE assist to promote function.     Baseline  needs UE support    Time  4    Period  Weeks    Status  New    Target Date  12/11/17      PT SHORT TERM GOAL #2   Title  Patient will be able to stand at counter for 15 minutes to be able to complete a standing activity    Baseline  Patient has limited standing time with UE support and AD    Time  4    Period  Weeks    Status  New    Target Date  12/11/17        PT  Long Term Goals - 01/15/18 1533      PT LONG TERM GOAL #1   Title  Patient will be independent in home exercise program to improve strength/mobility for better functional independence with ADLs    Baseline  no HEP    Time  8    Period  Weeks    Status  On-going    Target Date  03/05/18      PT LONG TERM GOAL #2   Title  Patient will ascend/descend 4 stairs with rail assist independently and ability to bring his rollator to the top or bottom of the steps , without loss of balance to improve ability to get in/out of home    Baseline  Patient reports difficulty with getting his assistive device up and down the steps and has had a fall trying to perform this activity    Time  8    Period  Weeks    Status  On-going    Target Date  03/05/18      PT LONG TERM GOAL #3   Title  Patient will increase 10 meter walk test to >1.58m/s as to improve gait speed for better community ambulation and to reduce fall risk.    Baseline  1.31 m/sec ;01/15/18= 1.31 m/sec    Time  8    Period  Weeks    Status  On-going    Target Date  03/05/18      PT LONG TERM GOAL #4   Title  Patient (> 28 years old) will complete five times sit to stand test in < 15 seconds indicating an increased LE strength and improved balance.    Baseline  18.22 sec,  01/15/18=  18.22    Time  8    Period  Weeks    Status  On-going    Target Date  03/05/18      PT LONG TERM GOAL #5   Title  Patient will ambulate 50 feet indoors with spc without falls or using the furniture for support.    Baseline  spc 50% of the time indoors    Time  8    Period  Weeks  Status  On-going    Target Date  03/05/18            Plan - 01/29/18 1539    Clinical Impression Statement  Pt requires frequent verbal and tactile cues for exercises with therapy on this date. He demonstrates difficulty with walking intermittently throughout session. Pt able to progress his resistance today with leg press. Pt encouraged to continue HEP and follow-up as  scheduled.     Rehab Potential  Good    Clinical Impairments Affecting Rehab Potential  chronicity of condition, Parkinson's disease, TKR BLE    PT Frequency  2x / week    PT Duration  8 weeks    PT Treatment/Interventions  Neuromuscular re-education;Manual techniques;Therapeutic exercise;Therapeutic activities;Aquatic Therapy;Gait training;Patient/family education;Cryotherapy;Stair training;Balance training;Dry needling;Taping    PT Next Visit Plan  Knee extension ROM, gait, functional strengthening, balance training    PT Home Exercise Plan  needs HEP next visit, 6 MW test, TUG test,     Consulted and Agree with Plan of Care  Patient       Patient will benefit from skilled therapeutic intervention in order to improve the following deficits and impairments:  Pain, Postural dysfunction, Improper body mechanics, Difficulty walking, Decreased strength, Decreased range of motion, Abnormal gait, Decreased balance, Decreased endurance, Decreased mobility, Impaired sensation, Decreased activity tolerance, Impaired flexibility  Visit Diagnosis: Muscle weakness (generalized)  Difficulty in walking, not elsewhere classified     Problem List Patient Active Problem List   Diagnosis Date Noted  . Moderate recurrent major depression ([place]) 08/15/2016  . Chest pain due to myocardial ischemia 08/14/2016  . Chest pain, rule out acute myocardial infarction 08/14/2016    Phillips Grout PT, DPT   Gloria Ricardo 01/30/2018, 8:34 PM  [place] MAIN [facility] SERVICES [address] [place], [place], [address] Phone: [phone]   Fax:  [phone]  Name: CHADEN DOOM MRN: [id number] Date of Birth: 09-06-1951

## 2018-01-30 DIAGNOSIS — M19079 Primary osteoarthritis, unspecified ankle and foot: Secondary | ICD-10-CM | POA: Insufficient documentation

## 2018-01-30 DIAGNOSIS — M2141 Flat foot [pes planus] (acquired), right foot: Secondary | ICD-10-CM | POA: Insufficient documentation

## 2018-01-30 DIAGNOSIS — M6701 Short Achilles tendon (acquired), right ankle: Secondary | ICD-10-CM | POA: Insufficient documentation

## 2018-01-31 ENCOUNTER — Ambulatory Visit: Payer: Medicare Other

## 2018-01-31 DIAGNOSIS — R262 Difficulty in walking, not elsewhere classified: Secondary | ICD-10-CM

## 2018-01-31 DIAGNOSIS — M6281 Muscle weakness (generalized): Secondary | ICD-10-CM | POA: Diagnosis not present

## 2018-01-31 NOTE — Therapy (Signed)
[place] MAIN [facility] SERVICES [address] [place], [place], [address] Phone: [phone]   Fax:  [phone]  Physical Therapy Treatment  Patient Details  Name: Charles Choi MRN: [id number] Date of Birth: 1951-03-08 Referring Provider: Fulton Reek D    Encounter Date: 01/31/2018  PT End of Session - 01/31/18 1532    Visit Number  18    Number of Visits  33    Date for PT Re-Evaluation  03/05/18    PT Start Time  1520    PT Stop Time  1605    PT Time Calculation (min)  45 min    Equipment Utilized During Treatment  Gait belt    Activity Tolerance  Patient limited by fatigue    Behavior During Therapy  [facility] for tasks assessed/performed       Past Medical History:  Diagnosis Date  . Anxiety   . Cancer ([place])   . Depression   . Hypertension   . Parkinson's disease [facility])     Past Surgical History:  Procedure Laterality Date  . BACK SURGERY    . CHOLECYSTECTOMY    . DEEP BRAIN STIMULATOR PLACEMENT     for Parkinson's disease  . JOINT REPLACEMENT     left knee x2    There were no vitals filed for this visit.  Subjective Assessment - 01/31/18 1529    Subjective  Pt reports that he is struggling with walking today. He has chronic R foot pain and arrived with an order from Dr. 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 No specific questions or concerns at this time.     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  Had PT at [facility] for 7 days.  Used the bike 6-10 minutes, as well as worked on walking, ROM for his R knee.  Started walking with a rw and is currently using a rollator walker.  Pt also adds that he was in a wc since October 2017 until the surgery due to pain. Patient has been on antiobiotic now due to the left knee being infected. The MD will test him in april to see if he continues to have an infection and if he needs to have any more  surgery on the left knee. Patients baseline for ambulation is that he was able to ambulate with a spc outdoors. He had PT in July for strengthening to be able to walk with a cane instead of the rollator.     Limitations  Walking;Standing    Patient Stated Goals  I want to walk without a walker, improve endurance.    Currently in Pain?  Yes    Pain Score  1     Pain Location  Foot    Pain Orientation  Right    Pain Descriptors / Indicators  Aching    Pain Type  Chronic pain                    TREATMENT   Neuromuscular Re-education  NuStep L3 x 5 minutes for warm-up during history; Step-ups to 6 inch stool alternating LE x 10 each side; Toe taps to 6 inch stool alternating LE x 10 each side; Side stepping in // bars without UE support x 6 lengths; 1/2 foam roller balance without UE support A/P orientation x multiple bouts; Airex balance without UE support with feet together and  horizontal head turn x multiple bouts;  Patient required min-moderate verbal/tactile cues for correct exercise techniqueincluding cues to improve posture, improve foot position, and reduce rail assist for better balance challenge; Patient often distracted and requires increased cues for correct activity;             PT Education - 01/31/18 1532    Education provided  Yes    Education Details  exercise form/technique    Person(s) Educated  Patient    Methods  Explanation    Comprehension  Verbalized understanding       PT Short Term Goals - 11/14/17 0848      PT SHORT TERM GOAL #1   Title  Pt will be able to perform sit <> stand transfer from a regular chair independently without UE assist to promote function.     Baseline  needs UE support    Time  4    Period  Weeks    Status  New    Target Date  12/11/17      PT SHORT TERM GOAL #2   Title  Patient will be able to stand at counter for 15 minutes to be able to complete a standing activity    Baseline  Patient has limited  standing time with UE support and AD    Time  4    Period  Weeks    Status  New    Target Date  12/11/17        PT Long Term Goals - 01/15/18 1533      PT LONG TERM GOAL #1   Title  Patient will be independent in home exercise program to improve strength/mobility for better functional independence with ADLs    Baseline  no HEP    Time  8    Period  Weeks    Status  On-going    Target Date  03/05/18      PT LONG TERM GOAL #2   Title  Patient will ascend/descend 4 stairs with rail assist independently and ability to bring his rollator to the top or bottom of the steps , without loss of balance to improve ability to get in/out of home    Baseline  Patient reports difficulty with getting his assistive device up and down the steps and has had a fall trying to perform this activity    Time  8    Period  Weeks    Status  On-going    Target Date  03/05/18      PT LONG TERM GOAL #3   Title  Patient will increase 10 meter walk test to >1.28m/s as to improve gait speed for better community ambulation and to reduce fall risk.    Baseline  1.31 m/sec ;01/15/18= 1.31 m/sec    Time  8    Period  Weeks    Status  On-going    Target Date  03/05/18      PT LONG TERM GOAL #4   Title  Patient (> 35 years old) will complete five times sit to stand test in < 15 seconds indicating an increased LE strength and improved balance.    Baseline  18.22 sec,  01/15/18=  18.22    Time  8    Period  Weeks    Status  On-going    Target Date  03/05/18      PT LONG TERM GOAL #5   Title  Patient will ambulate 50 feet indoors with spc without falls  or using the furniture for support.    Baseline  spc 50% of the time indoors    Time  8    Period  Weeks    Status  On-going    Target Date  03/05/18            Plan - 01/31/18 1533    Clinical Impression Statement  Pt struggles some today with motor control and strength. Frequent LE buckling with step-ups. Pt will have to finish his current episode for  balance before starting therapy for his R equinus deformity because his new order is from a different physician. Pt states that he would like to wait until he gets his custom orthotics to start therapy for his R foot. He will notify the front office so they can schedule him for a discharge visit and a new evaluation. Pt encouraged to continue HEP and follow-up as scheduled.     Rehab Potential  Good    Clinical Impairments Affecting Rehab Potential  chronicity of condition, Parkinson's disease, TKR BLE    PT Frequency  2x / week    PT Duration  8 weeks    PT Treatment/Interventions  Neuromuscular re-education;Manual techniques;Therapeutic exercise;Therapeutic activities;Aquatic Therapy;Gait training;Patient/family education;Cryotherapy;Stair training;Balance training;Dry needling;Taping    PT Next Visit Plan  Knee extension ROM, gait, functional strengthening, balance training    PT Home Exercise Plan  needs HEP next visit, 6 MW test, TUG test,     Consulted and Agree with Plan of Care  Patient       Patient will benefit from skilled therapeutic intervention in order to improve the following deficits and impairments:  Pain, Postural dysfunction, Improper body mechanics, Difficulty walking, Decreased strength, Decreased range of motion, Abnormal gait, Decreased balance, Decreased endurance, Decreased mobility, Impaired sensation, Decreased activity tolerance, Impaired flexibility  Visit Diagnosis: Muscle weakness (generalized)  Difficulty in walking, not elsewhere classified     Problem List Patient Active Problem List   Diagnosis Date Noted  . Moderate recurrent major depression ([place]) 08/15/2016  . Chest pain due to myocardial ischemia 08/14/2016  . Chest pain, rule out acute myocardial infarction 08/14/2016   Phillips Grout PT, DPT   Rosaly Labarbera 01/31/2018, 4:20 PM  [place] MAIN [facility] SERVICES [address] [place], [place],  [address] Phone: [phone]   Fax:  [phone]  Name: ANTWAINE BOOMHOWER MRN: [id number] Date of Birth: [birth year]

## 2018-02-01 ENCOUNTER — Ambulatory Visit: Payer: Medicare Other | Admitting: Physical Therapy

## 2018-02-05 ENCOUNTER — Ambulatory Visit: Payer: Medicare Other | Attending: Internal Medicine | Admitting: Physical Therapy

## 2018-02-05 ENCOUNTER — Encounter: Payer: Self-pay | Admitting: Physical Therapy

## 2018-02-05 DIAGNOSIS — M6281 Muscle weakness (generalized): Secondary | ICD-10-CM | POA: Insufficient documentation

## 2018-02-05 DIAGNOSIS — R262 Difficulty in walking, not elsewhere classified: Secondary | ICD-10-CM | POA: Diagnosis present

## 2018-02-05 DIAGNOSIS — M25561 Pain in right knee: Secondary | ICD-10-CM | POA: Insufficient documentation

## 2018-02-05 DIAGNOSIS — G8929 Other chronic pain: Secondary | ICD-10-CM | POA: Insufficient documentation

## 2018-02-05 NOTE — Therapy (Signed)
[place] MAIN [facility] SERVICES [address] [place], [place], [address] Phone: [phone]   Fax:  [phone]  Physical Therapy Treatment  Patient Details  Name: Charles Choi MRN: [id number] Date of Birth: 04/04/1951 Referring Provider: Fulton Reek D    Encounter Date: 02/05/2018  PT End of Session - 02/05/18 1524    Visit Number  19    Number of Visits  33    Date for PT Re-Evaluation  03/05/18    PT Start Time  9211    PT Stop Time  1600    PT Time Calculation (min)  45 min    Equipment Utilized During Treatment  Gait belt    Activity Tolerance  Patient limited by fatigue    Behavior During Therapy  [facility] for tasks assessed/performed       Past Medical History:  Diagnosis Date  . Anxiety   . Cancer ([place])   . Depression   . Hypertension   . Parkinson's disease [facility])     Past Surgical History:  Procedure Laterality Date  . BACK SURGERY    . CHOLECYSTECTOMY    . DEEP BRAIN STIMULATOR PLACEMENT     for Parkinson's disease  . JOINT REPLACEMENT     left knee x2    There were no vitals filed for this visit.  Subjective Assessment - 02/05/18 1522    Subjective  Patient reports that he is doing better with his walking.     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  Had PT at [facility] for 7 days.  Used the bike 6-10 minutes, as well as worked on walking, ROM for his R knee.  Started walking with a rw and is currently using a rollator walker.  Pt also adds that he was in a wc since October 2017 until the surgery due to pain. Patient has been on antiobiotic now due to the left knee being infected. The MD will test him in april to see if he continues to have an infection and if he needs to have any more surgery on the left knee. Patients baseline for ambulation is that he was able to ambulate with a spc outdoors. He had PT in July for strengthening to be able to walk with a cane  instead of the rollator.     Limitations  Walking;Standing    Patient Stated Goals  I want to walk without a walker, improve endurance.    Currently in Pain?  Yes    Pain Score  1     Pain Location  Foot    Pain Orientation  Right    Pain Descriptors / Indicators  Aching    Pain Type  Chronic pain    Pain Onset  More than a month ago    Pain Frequency  Constant    Aggravating Factors   with activity    Pain Relieving Factors  resting    Effect of Pain on Daily Activities  not able to do activities    Multiple Pain Sites  No         Treatment:   Standing on airex: Modified tandem stance: BUE ball pass side/side x5 reps each direction with CGA for safety and cues to keep both hands on ball for better trunk rotation and balance challenge;Patient would start to loose balance and then would grab for the rail; Instructed patient to improve upper trunk control  for less loss of balance;   Exercise: Sit<>Stand from low mat table with small ball in hands,2x5 with cues to increase push through LE for better LE strengthening;Patient had increased difficulty with increased repetition;  Leg press: BLE plate90# 2x12with cues for positioning to improve LE strengthening and motor control;   Sitting: Hamstring curl, green tband x10 bilaterally with min Vcs to increase ROM with knee flexion for better strengthening; Warm up on Nustep BUE/BLE level 2 x5 min (Unbilled);   Patient required min-moderate verbal/tactile cues for correct exercise techniqueincluding cues to improve posture, improve foot position, and reduce rail assist for better balance challenge; Patient often distracted and requires increased cues for correct activity;  Gait training: TM . 7 miles / hour x 5 mins SPC 200 feet x 2 with CGA    Cues for posture and heel toe gait. Min cueing needed to appropriately perform  tasks with leg, hand, and head position. Decreased coordination demonstrated requiring consistent  verbal cueing to correct form.  Patient continues to demonstrate some in coordination of movement with select exercises Patient responds well to verbal and tactile cues to correct form and technique.  CGA to SBA for safety with activities.  Uses to increase intensity of movements throughout session                     PT Education - 02/05/18 1524    Education provided  Yes    Education Details  saftey with transfers    Person(s) Educated  Patient    Methods  Explanation;Demonstration;Tactile cues    Comprehension  Verbalized understanding;Returned demonstration       PT Short Term Goals - 11/14/17 0848      PT SHORT TERM GOAL #1   Title  Pt will be able to perform sit <> stand transfer from a regular chair independently without UE assist to promote function.     Baseline  needs UE support    Time  4    Period  Weeks    Status  New    Target Date  12/11/17      PT SHORT TERM GOAL #2   Title  Patient will be able to stand at counter for 15 minutes to be able to complete a standing activity    Baseline  Patient has limited standing time with UE support and AD    Time  4    Period  Weeks    Status  New    Target Date  12/11/17        PT Long Term Goals - 01/15/18 1533      PT LONG TERM GOAL #1   Title  Patient will be independent in home exercise program to improve strength/mobility for better functional independence with ADLs    Baseline  no HEP    Time  8    Period  Weeks    Status  On-going    Target Date  03/05/18      PT LONG TERM GOAL #2   Title  Patient will ascend/descend 4 stairs with rail assist independently and ability to bring his rollator to the top or bottom of the steps , without loss of balance to improve ability to get in/out of home    Baseline  Patient reports difficulty with getting his assistive device up and down the steps and has had a fall trying to perform this activity    Time  8    Period  Weeks  Status  On-going    Target  Date  03/05/18      PT LONG TERM GOAL #3   Title  Patient will increase 10 meter walk test to >1.35m/s as to improve gait speed for better community ambulation and to reduce fall risk.    Baseline  1.31 m/sec ;01/15/18= 1.31 m/sec    Time  8    Period  Weeks    Status  On-going    Target Date  03/05/18      PT LONG TERM GOAL #4   Title  Patient (> 47 years old) will complete five times sit to stand test in < 15 seconds indicating an increased LE strength and improved balance.    Baseline  18.22 sec,  01/15/18=  18.22    Time  8    Period  Weeks    Status  On-going    Target Date  03/05/18      PT LONG TERM GOAL #5   Title  Patient will ambulate 50 feet indoors with spc without falls or using the furniture for support.    Baseline  spc 50% of the time indoors    Time  8    Period  Weeks    Status  On-going    Target Date  03/05/18            Plan - 02/05/18 1528    Clinical Impression Statement  Pt requires direction and verbal cues for correct performance of standing dynamic balance  exercises. Patient has fatigue with endurance and difficulty with weight shift.   Patient struggles with speed during movement as well as balance with unstable surfaces. Pt encouraged to continue HEP   Patient will benefit from continued skilled PT to improve mobility and safety.    Rehab Potential  Good    Clinical Impairments Affecting Rehab Potential  chronicity of condition, Parkinson's disease, TKR BLE    PT Frequency  2x / week    PT Duration  8 weeks    PT Treatment/Interventions  Neuromuscular re-education;Manual techniques;Therapeutic exercise;Therapeutic activities;Aquatic Therapy;Gait training;Patient/family education;Cryotherapy;Stair training;Balance training;Dry needling;Taping    PT Next Visit Plan  Knee extension ROM, gait, functional strengthening, balance training    PT Home Exercise Plan  needs HEP next visit, 6 MW test, TUG test,     Consulted and Agree with Plan of Care   Patient       Patient will benefit from skilled therapeutic intervention in order to improve the following deficits and impairments:  Pain, Postural dysfunction, Improper body mechanics, Difficulty walking, Decreased strength, Decreased range of motion, Abnormal gait, Decreased balance, Decreased endurance, Decreased mobility, Impaired sensation, Decreased activity tolerance, Impaired flexibility  Visit Diagnosis: Muscle weakness (generalized)  Difficulty in walking, not elsewhere classified  Chronic pain of right knee     Problem List Patient Active Problem List   Diagnosis Date Noted  . Moderate recurrent major depression ([place]) 08/15/2016  . Chest pain due to myocardial ischemia 08/14/2016  . Chest pain, rule out acute myocardial infarction 08/14/2016    Alanson Puls, [place] DPT 02/05/2018, 3:30 PM  [place] MAIN [facility] SERVICES [address] [place], [place], [address] Phone: [phone]   Fax:  [phone]  Name: Charles Choi MRN: [id number] Date of Birth: May 24, 1951

## 2018-02-07 ENCOUNTER — Encounter: Payer: Self-pay | Admitting: Physical Therapy

## 2018-02-07 ENCOUNTER — Ambulatory Visit: Payer: Medicare Other | Admitting: Physical Therapy

## 2018-02-07 VITALS — BP 161/69 | HR 95 | Resp 16

## 2018-02-07 DIAGNOSIS — M25561 Pain in right knee: Secondary | ICD-10-CM

## 2018-02-07 DIAGNOSIS — G8929 Other chronic pain: Secondary | ICD-10-CM

## 2018-02-07 DIAGNOSIS — M6281 Muscle weakness (generalized): Secondary | ICD-10-CM | POA: Diagnosis not present

## 2018-02-07 DIAGNOSIS — R262 Difficulty in walking, not elsewhere classified: Secondary | ICD-10-CM

## 2018-02-07 NOTE — Therapy (Signed)
[place] MAIN [facility] SERVICES [address] [place], [place], [address] Phone: [phone]   Fax:  [phone]  Physical Therapy Treatment  Patient Details  Name: Charles Choi MRN: [id number] Date of Birth: 1951/02/01 Referring Provider: Fulton Reek D    Encounter Date: 02/07/2018  PT End of Session - 02/07/18 1529    Visit Number  20    Number of Visits  33    Date for PT Re-Evaluation  03/05/18    PT Start Time  0315    PT Stop Time  0400    PT Time Calculation (min)  45 min    Equipment Utilized During Treatment  Gait belt    Activity Tolerance  Patient limited by fatigue    Behavior During Therapy  [facility] for tasks assessed/performed       Past Medical History:  Diagnosis Date  . Anxiety   . Cancer ([place])   . Depression   . Hypertension   . Parkinson's disease [facility])     Past Surgical History:  Procedure Laterality Date  . BACK SURGERY    . CHOLECYSTECTOMY    . DEEP BRAIN STIMULATOR PLACEMENT     for Parkinson's disease  . JOINT REPLACEMENT     left knee x2    There were no vitals filed for this visit.  Subjective Assessment - 02/07/18 1525    Subjective  Patient reports that he is doing better with his walking. He was fitted for an AFO for left foot.     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  Had PT at [facility] for 7 days.  Used the bike 6-10 minutes, as well as worked on walking, ROM for his R knee.  Started walking with a rw and is currently using a rollator walker.  Pt also adds that he was in a wc since October 2017 until the surgery due to pain. Patient has been on antiobiotic now due to the left knee being infected. The MD will test him in april to see if he continues to have an infection and if he needs to have any more surgery on the left knee. Patients baseline for ambulation is that he was able to ambulate with a spc outdoors. He had PT in July for  strengthening to be able to walk with a cane instead of the rollator.     Limitations  Walking;Standing    Patient Stated Goals  I want to walk without a walker, improve endurance.    Currently in Pain?  Yes    Pain Score  1     Pain Location  Foot    Pain Orientation  Right    Pain Descriptors / Indicators  Aching    Pain Type  Chronic pain    Pain Onset  More than a month ago    Pain Frequency  Constant    Aggravating Factors   with activity    Pain Relieving Factors  resting    Effect of Pain on Daily Activities  not able to do activities       NEUROMUSCULAR RE-EDUCATION 1/2 foam flat side up and balance with head turns left and right feet apart and feet together, standing on 1/2 foam, with vc for upright posture standing hip abd with YTB x 20   side stepping left and right in parallel bars 10 feet x 3 step ups from floor to 6  inch stool x 20 bilateral marching in parallel bars x 20 Tilt board fwd/bwd, side to side left and right with UE support intermitently Leg press x 20 reps x 100 lbs Patient needs occasional verbal cueing to improve posture and cueing to correctly perform exercises slowly, holding at end of range to increase motor firing of desired muscle to encourage fatigue.BLE staggered stance anterior/posterior weight shifting on small rockerboard;  CGA, increased difficulty and more unsteadiness with RLE fwd; min cues to increase weight shift over RLE                         PT Education - 02/07/18 1528    Education provided  Yes    Education Details  HEP progression    Person(s) Educated  Patient    Methods  Explanation;Demonstration;Tactile cues    Comprehension  Verbalized understanding;Returned demonstration       PT Short Term Goals - 11/14/17 0848      PT SHORT TERM GOAL #1   Title  Pt will be able to perform sit <> stand transfer from a regular chair independently without UE assist to promote function.     Baseline  needs UE support     Time  4    Period  Weeks    Status  New    Target Date  12/11/17      PT SHORT TERM GOAL #2   Title  Patient will be able to stand at counter for 15 minutes to be able to complete a standing activity    Baseline  Patient has limited standing time with UE support and AD    Time  4    Period  Weeks    Status  New    Target Date  12/11/17        PT Long Term Goals - 01/15/18 1533      PT LONG TERM GOAL #1   Title  Patient will be independent in home exercise program to improve strength/mobility for better functional independence with ADLs    Baseline  no HEP    Time  8    Period  Weeks    Status  On-going    Target Date  03/05/18      PT LONG TERM GOAL #2   Title  Patient will ascend/descend 4 stairs with rail assist independently and ability to bring his rollator to the top or bottom of the steps , without loss of balance to improve ability to get in/out of home    Baseline  Patient reports difficulty with getting his assistive device up and down the steps and has had a fall trying to perform this activity    Time  8    Period  Weeks    Status  On-going    Target Date  03/05/18      PT LONG TERM GOAL #3   Title  Patient will increase 10 meter walk test to >1.72m/s as to improve gait speed for better community ambulation and to reduce fall risk.    Baseline  1.31 m/sec ;01/15/18= 1.31 m/sec    Time  8    Period  Weeks    Status  On-going    Target Date  03/05/18      PT LONG TERM GOAL #4   Title  Patient (> 72 years old) will complete five times sit to stand test in < 15 seconds indicating an increased LE strength and improved  balance.    Baseline  18.22 sec,  01/15/18=  18.22    Time  8    Period  Weeks    Status  On-going    Target Date  03/05/18      PT LONG TERM GOAL #5   Title  Patient will ambulate 50 feet indoors with spc without falls or using the furniture for support.    Baseline  spc 50% of the time indoors    Time  8    Period  Weeks    Status  On-going     Target Date  03/05/18            Plan - 02/07/18 1530    Clinical Impression Statement  Mod cueing needed to appropriately perform balance tasks with leg, hand, and head position. Decreased coordination demonstrated requiring consistent verbal cueing to correct form.  Patient continues to demonstrate some in coordination of movement with select exercises such as weight shifting balance board and Baps board.  Patient responds well to verbal and tactile cues to correct form and technique.  CGA to SBA for safety with activities.  Cues to increase intensity and amplitude of movements throughout session    Rehab Potential  Good    Clinical Impairments Affecting Rehab Potential  chronicity of condition, Parkinson's disease, TKR BLE    PT Frequency  2x / week    PT Duration  8 weeks    PT Treatment/Interventions  Neuromuscular re-education;Manual techniques;Therapeutic exercise;Therapeutic activities;Aquatic Therapy;Gait training;Patient/family education;Cryotherapy;Stair training;Balance training;Dry needling;Taping    PT Next Visit Plan  Knee extension ROM, gait, functional strengthening, balance training    PT Home Exercise Plan  needs HEP next visit, 6 MW test, TUG test,     Consulted and Agree with Plan of Care  Patient       Patient will benefit from skilled therapeutic intervention in order to improve the following deficits and impairments:  Pain, Postural dysfunction, Improper body mechanics, Difficulty walking, Decreased strength, Decreased range of motion, Abnormal gait, Decreased balance, Decreased endurance, Decreased mobility, Impaired sensation, Decreased activity tolerance, Impaired flexibility  Visit Diagnosis: Muscle weakness (generalized)  Difficulty in walking, not elsewhere classified  Chronic pain of right knee     Problem List Patient Active Problem List   Diagnosis Date Noted  . Moderate recurrent major depression ([place]) 08/15/2016  . Chest pain due to  myocardial ischemia 08/14/2016  . Chest pain, rule out acute myocardial infarction 08/14/2016    Alanson Puls, [place] DPT 02/07/2018, 3:40 PM  [place] MAIN [facility] SERVICES [address] [place], [place], [address] Phone: [phone]   Fax:  [phone]  Name: Charles Choi MRN: [id number] Date of Birth: [birth year]

## 2018-02-13 ENCOUNTER — Ambulatory Visit: Payer: Medicare Other | Admitting: Physical Therapy

## 2018-02-13 ENCOUNTER — Encounter: Payer: Self-pay | Admitting: Physical Therapy

## 2018-02-13 DIAGNOSIS — M6281 Muscle weakness (generalized): Secondary | ICD-10-CM | POA: Diagnosis not present

## 2018-02-13 DIAGNOSIS — M25561 Pain in right knee: Secondary | ICD-10-CM

## 2018-02-13 DIAGNOSIS — G8929 Other chronic pain: Secondary | ICD-10-CM

## 2018-02-13 DIAGNOSIS — R262 Difficulty in walking, not elsewhere classified: Secondary | ICD-10-CM

## 2018-02-13 NOTE — Therapy (Signed)
[place] MAIN [facility] SERVICES [address] [place], [place], [address] Phone: [phone]   Fax:  [phone]  Physical Therapy Treatment  Patient Details  Name: Charles Choi MRN: [id number] Date of Birth: February 24, 1951 Referring Provider: Fulton Reek D    Encounter Date: 02/13/2018  PT End of Session - 02/13/18 1643    Visit Number  21    Number of Visits  33    Date for PT Re-Evaluation  03/05/18    PT Start Time  0430    PT Stop Time  0510    PT Time Calculation (min)  40 min    Equipment Utilized During Treatment  Gait belt    Activity Tolerance  Patient limited by fatigue    Behavior During Therapy  [facility] for tasks assessed/performed       Past Medical History:  Diagnosis Date  . Anxiety   . Cancer ([place])   . Depression   . Hypertension   . Parkinson's disease [facility])     Past Surgical History:  Procedure Laterality Date  . BACK SURGERY    . CHOLECYSTECTOMY    . DEEP BRAIN STIMULATOR PLACEMENT     for Parkinson's disease  . JOINT REPLACEMENT     left knee x2    There were no vitals filed for this visit.  Subjective Assessment - 02/13/18 1641    Subjective  Patient reports that he is doing better with his walking. He was fitted for an AFO for left foot.     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  Had PT at [facility] for 7 days.  Used the bike 6-10 minutes, as well as worked on walking, ROM for his R knee.  Started walking with a rw and is currently using a rollator walker.  Pt also adds that he was in a wc since October 2017 until the surgery due to pain. Patient has been on antiobiotic now due to the left knee being infected. The MD will test him in april to see if he continues to have an infection and if he needs to have any more surgery on the left knee. Patients baseline for ambulation is that he was able to ambulate with a spc outdoors. He had PT in July for  strengthening to be able to walk with a cane instead of the rollator.     Limitations  Walking;Standing    Patient Stated Goals  I want to walk without a walker, improve endurance.    Currently in Pain?  Yes    Pain Score  1     Pain Location  Foot    Pain Orientation  Right    Pain Descriptors / Indicators  Aching    Pain Type  Chronic pain    Pain Onset  More than a month ago    Pain Frequency  Constant    Aggravating Factors   with activity    Pain Relieving Factors  resting    Multiple Pain Sites  No       Ther-ex Nustep BUE/BLE level 1 x 5 min for warm-up during history (4 minutes unbilled);  Quantum leg press BLE 100# x 15,  x 2 with cues to slow down LE movement for better strengthening;  Sit to stand from regular height chair with Airex on seat without UE support 2 x 5, heavy cues to increase speed, utilize momentum, and increase  anterior weight shifting; Standing green tband hip abduction x10 bilaterally, repeated cues to avoid external rotation at hip for hip flexor substitution; Standing green tband hip extension x10 bilaterally;   Neuromuscular Re-education Side stepping in // bars; Side stepping with horizontal head turns; Side stepping on Airex balance beam; Side stepping on Airex balance beam with horizontal head turns; Toe taps to 5" step without UE support; Rocker board fwd/ bwd, side to side x 20   Verbal cues provided throughout session to correct exercise technique and target specific muscles                         PT Education - 02/13/18 1642    Education provided  Yes    Education Details  HEP    Person(s) Educated  Patient    Methods  Explanation;Demonstration;Tactile cues;Verbal cues    Comprehension  Verbalized understanding;Returned demonstration;Verbal cues required       PT Short Term Goals - 11/14/17 0848      PT SHORT TERM GOAL #1   Title  Pt will be able to perform sit <> stand transfer from a regular chair  independently without UE assist to promote function.     Baseline  needs UE support    Time  4    Period  Weeks    Status  New    Target Date  12/11/17      PT SHORT TERM GOAL #2   Title  Patient will be able to stand at counter for 15 minutes to be able to complete a standing activity    Baseline  Patient has limited standing time with UE support and AD    Time  4    Period  Weeks    Status  New    Target Date  12/11/17        PT Long Term Goals - 01/15/18 1533      PT LONG TERM GOAL #1   Title  Patient will be independent in home exercise program to improve strength/mobility for better functional independence with ADLs    Baseline  no HEP    Time  8    Period  Weeks    Status  On-going    Target Date  03/05/18      PT LONG TERM GOAL #2   Title  Patient will ascend/descend 4 stairs with rail assist independently and ability to bring his rollator to the top or bottom of the steps , without loss of balance to improve ability to get in/out of home    Baseline  Patient reports difficulty with getting his assistive device up and down the steps and has had a fall trying to perform this activity    Time  8    Period  Weeks    Status  On-going    Target Date  03/05/18      PT LONG TERM GOAL #3   Title  Patient will increase 10 meter walk test to >1.14m/s as to improve gait speed for better community ambulation and to reduce fall risk.    Baseline  1.31 m/sec ;01/15/18= 1.31 m/sec    Time  8    Period  Weeks    Status  On-going    Target Date  03/05/18      PT LONG TERM GOAL #4   Title  Patient (> 49 years old) will complete five times sit to stand test in < 15 seconds indicating  an increased LE strength and improved balance.    Baseline  18.22 sec,  01/15/18=  18.22    Time  8    Period  Weeks    Status  On-going    Target Date  03/05/18      PT LONG TERM GOAL #5   Title  Patient will ambulate 50 feet indoors with spc without falls or using the furniture for support.     Baseline  spc 50% of the time indoors    Time  8    Period  Weeks    Status  On-going    Target Date  03/05/18            Plan - 02/13/18 1643    Clinical Impression Statement  Pt requires direction and verbal cues for correct performance of exercises. Patient demonstrates LOB with   strengthening exercises indicating decreased core strength. Pt was able to  perform exercises today, noting  improved endurance and improved core strength.  Pt was able to perform all exercises with min assist and VC for technique..   Patient struggles with speed during movement as well as balance with unstable surfaces. He Pt encouraged to continue HEP .Follow-up as scheduled.    Rehab Potential  Good    Clinical Impairments Affecting Rehab Potential  chronicity of condition, Parkinson's disease, TKR BLE    PT Frequency  2x / week    PT Duration  8 weeks    PT Treatment/Interventions  Neuromuscular re-education;Manual techniques;Therapeutic exercise;Therapeutic activities;Aquatic Therapy;Gait training;Patient/family education;Cryotherapy;Stair training;Balance training;Dry needling;Taping    PT Next Visit Plan  Knee extension ROM, gait, functional strengthening, balance training    PT Home Exercise Plan  needs HEP next visit, 6 MW test, TUG test,     Consulted and Agree with Plan of Care  Patient       Patient will benefit from skilled therapeutic intervention in order to improve the following deficits and impairments:  Pain, Postural dysfunction, Improper body mechanics, Difficulty walking, Decreased strength, Decreased range of motion, Abnormal gait, Decreased balance, Decreased endurance, Decreased mobility, Impaired sensation, Decreased activity tolerance, Impaired flexibility  Visit Diagnosis: Muscle weakness (generalized)  Difficulty in walking, not elsewhere classified  Chronic pain of right knee     Problem List Patient Active Problem List   Diagnosis Date Noted  . Moderate recurrent  major depression ([place]) 08/15/2016  . Chest pain due to myocardial ischemia 08/14/2016  . Chest pain, rule out acute myocardial infarction 08/14/2016    Alanson Puls, [place] DPT 02/13/2018, 4:46 PM  [place] MAIN [facility] SERVICES [address] [place], [place], [address] Phone: [phone]   Fax:  [phone]  Name: TYRRELL STEPHENS MRN: [id number] Date of Birth: Feb 13, 1951

## 2018-02-20 ENCOUNTER — Ambulatory Visit: Payer: Medicare Other | Admitting: Physical Therapy

## 2018-02-20 ENCOUNTER — Encounter: Payer: Self-pay | Admitting: Physical Therapy

## 2018-02-20 DIAGNOSIS — M25561 Pain in right knee: Secondary | ICD-10-CM

## 2018-02-20 DIAGNOSIS — R262 Difficulty in walking, not elsewhere classified: Secondary | ICD-10-CM

## 2018-02-20 DIAGNOSIS — G8929 Other chronic pain: Secondary | ICD-10-CM

## 2018-02-20 DIAGNOSIS — M6281 Muscle weakness (generalized): Secondary | ICD-10-CM

## 2018-02-20 NOTE — Therapy (Signed)
[place] MAIN [facility] SERVICES [address] [place], [place], [address] Phone: [phone]   Fax:  [phone]  Physical Therapy Treatment  Patient Details  Name: Charles Choi MRN: [id number] Date of Birth: 07/17/1951 Referring Provider: Fulton Reek D    Encounter Date: 02/20/2018  PT End of Session - 02/20/18 1644    Visit Number  22    Number of Visits  33    Date for PT Re-Evaluation  03/05/18    PT Start Time  0400    PT Stop Time  0445    PT Time Calculation (min)  45 min    Equipment Utilized During Treatment  Gait belt    Activity Tolerance  Patient limited by fatigue    Behavior During Therapy  [facility] for tasks assessed/performed       Past Medical History:  Diagnosis Date  . Anxiety   . Cancer ([place])   . Depression   . Hypertension   . Parkinson's disease [facility])     Past Surgical History:  Procedure Laterality Date  . BACK SURGERY    . CHOLECYSTECTOMY    . DEEP BRAIN STIMULATOR PLACEMENT     for Parkinson's disease  . JOINT REPLACEMENT     left knee x2    There were no vitals filed for this visit.  Subjective Assessment - 02/20/18 1640    Subjective  Patient is reporting that his right arm is beginning to hurt and ranges from 1/10-4/10 . he thinks its because he is using his RUE for almost everything due to the left shoulder chronic pain.     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  Had PT at [facility] for 7 days.  Used the bike 6-10 minutes, as well as worked on walking, ROM for his R knee.  Started walking with a rw and is currently using a rollator walker.  Pt also adds that he was in a wc since October 2017 until the surgery due to pain. Patient has been on antiobiotic now due to the left knee being infected. The MD will test him in april to see if he continues to have an infection and if he needs to have any more surgery on the left knee. Patients baseline  for ambulation is that he was able to ambulate with a spc outdoors. He had PT in July for strengthening to be able to walk with a cane instead of the rollator.     Limitations  Walking;Standing    Patient Stated Goals  I want to walk without a walker, improve endurance.    Currently in Pain?  Yes    Pain Score  1     Pain Location  Shoulder    Pain Orientation  Right;Left    Pain Descriptors / Indicators  Aching    Pain Type  Chronic pain    Pain Frequency  Constant    Aggravating Factors   with activity    Pain Relieving Factors  resting    Effect of Pain on Daily Activities  not able to do activiites    Multiple Pain Sites  No      :    TREATMENT: Warm up on [employer]  Instructed patient in advanced balance exercise: Standing on airex: Heel/toe raises with rail assist x15; Alternate toe taps on 4 inch step without rail assist x15 with cues to step backwards for better  foot placement and avoid toe catching; Standing one foot on airex, one foot on 4 inch step with BUE ball pass side/side x10 each foot on step with min A for safety and cues to improve trunk rotation for better balance challenge; Modified tandem stance with BUE ball up/down x10 each foot in front with CGA for safety;   Standing on airex beam:  Tandem stance on airex beam without rail assist 10 sec hold x4 each foot in front with CGA for safety and cues to improve upper trunk control for better balance control; Side stepping down airex beam without rail assist x3 laps each direction with cues to keep feet on beam and avoid stepping off; Standing with feet apart, BUE ball toss x10 unsupported with close supervision; Patient exhibits posterior loss of balance requiring cues for forward weight shift;   Standing on 1/2 bolster (flat side up) Heel/toe rocks x15 with cues to reduce hip movement and isolate ankle stretch; Feet flat, apart, BUE wand flexion x10 reps with min A For safety; Patient able to keep balance  well with minimal posterior loss of balance   Leg press x 20 reps x 2 sets , 120 lbs, heel raises with 75 lbs x 20 x 2   TM walking at . 7 miles / hour x 6 mins, gait training with spc 200 feet x 3 with SBA and decreased gait deviations and better step length     CGA and Min to mod verbal cues used throughout with increased in postural sway and LOB most seen with narrow base of support and while on uneven surfaces. Continues to have balance deficits typical with diagnosis. Patient performs intermediate level exercises without pain behaviors and needs verbal cuing for postural alignment and head positioning                     PT Education - 02/20/18 1643    Education provided  Yes    Education Details  HEP    Person(s) Educated  Patient    Methods  Explanation;Demonstration;Tactile cues    Comprehension  Verbalized understanding;Returned demonstration       PT Short Term Goals - 11/14/17 0848      PT SHORT TERM GOAL #1   Title  Pt will be able to perform sit <> stand transfer from a regular chair independently without UE assist to promote function.     Baseline  needs UE support    Time  4    Period  Weeks    Status  New    Target Date  12/11/17      PT SHORT TERM GOAL #2   Title  Patient will be able to stand at counter for 15 minutes to be able to complete a standing activity    Baseline  Patient has limited standing time with UE support and AD    Time  4    Period  Weeks    Status  New    Target Date  12/11/17        PT Long Term Goals - 02/20/18 1713      PT LONG TERM GOAL #1   Title  Patient will be independent in home exercise program to improve strength/mobility for better functional independence with ADLs    Baseline  stretching and strengthening    Time  8    Period  Weeks    Status  On-going    Target Date  03/05/18  PT LONG TERM GOAL #2   Title  Patient will ascend/descend 4 stairs with rail assist independently and ability to bring  his rollator to the top or bottom of the steps , without loss of balance to improve ability to get in/out of home    Baseline  Patient is able to ascend and descend steps with railing and CGA    Time  8    Period  Weeks    Status  On-going    Target Date  03/05/18      PT LONG TERM GOAL #3   Title  Patient will increase 10 meter walk test to >1.47m/s as to improve gait speed for better community ambulation and to reduce fall risk.    Baseline  1.31 m/sec ;01/15/18= 1.31 m/sec, 02/20/18 1.31 m/sec    Time  8    Period  Weeks    Status  On-going    Target Date  03/05/18      PT LONG TERM GOAL #4   Title  Patient (> 65 years old) will complete five times sit to stand test in < 15 seconds indicating an increased LE strength and improved balance.    Baseline  18.22 sec,  01/15/18=  18.22, 02/20/17 =18.22 sec    Time  8    Period  Weeks    Status  On-going    Target Date  03/05/18      PT LONG TERM GOAL #5   Title  Patient will ambulate 50 feet indoors with spc without falls or using the furniture for support.    Baseline  spc 75 % of the time indoors    Time  8    Period  Weeks    Status  On-going    Target Date  02/11/18            Plan - 02/20/18 1710    Clinical Impression Statement  Patient demonstrates LE  weakness, and impaired gait and lack of balance  that is causing difficulty with  mobility and falls.  Patient still fatigues quickly to due very weak BLE's.  Patient showed poor safety  technique and is able to generate good form after heavy cueing. Patient will continue to benefit from skilled physical therapy to improve BLE strength and dynamic standing balance to improve quality of life.     Rehab Potential  Good    Clinical Impairments Affecting Rehab Potential  chronicity of condition, Parkinson's disease, TKR BLE    PT Frequency  2x / week    PT Duration  8 weeks    PT Treatment/Interventions  Neuromuscular re-education;Manual techniques;Therapeutic  exercise;Therapeutic activities;Aquatic Therapy;Gait training;Patient/family education;Cryotherapy;Stair training;Balance training;Dry needling;Taping    PT Next Visit Plan  Knee extension ROM, gait, functional strengthening, balance training    PT Home Exercise Plan  needs HEP next visit, 6 MW test, TUG test,     Consulted and Agree with Plan of Care  Patient       Patient will benefit from skilled therapeutic intervention in order to improve the following deficits and impairments:  Pain, Postural dysfunction, Improper body mechanics, Difficulty walking, Decreased strength, Decreased range of motion, Abnormal gait, Decreased balance, Decreased endurance, Decreased mobility, Impaired sensation, Decreased activity tolerance, Impaired flexibility  Visit Diagnosis: Muscle weakness (generalized)  Difficulty in walking, not elsewhere classified  Chronic pain of right knee     Problem List Patient Active Problem List   Diagnosis Date Noted  . Moderate recurrent major depression ([place]) 08/15/2016  .  Chest pain due to myocardial ischemia 08/14/2016  . Chest pain, rule out acute myocardial infarction 08/14/2016    Alanson Puls , [place] DPT 02/20/2018, 5:19 PM  [place] MAIN [facility] SERVICES [address] [place], [place], [address] Phone: [phone]   Fax:  [phone]  Name: Charles Choi MRN: [id number] Date of Birth: [birth year]

## 2018-02-22 ENCOUNTER — Encounter: Payer: Self-pay | Admitting: Physical Therapy

## 2018-02-22 ENCOUNTER — Ambulatory Visit: Payer: Medicare Other | Admitting: Physical Therapy

## 2018-02-22 DIAGNOSIS — M25561 Pain in right knee: Secondary | ICD-10-CM

## 2018-02-22 DIAGNOSIS — M6281 Muscle weakness (generalized): Secondary | ICD-10-CM

## 2018-02-22 DIAGNOSIS — R262 Difficulty in walking, not elsewhere classified: Secondary | ICD-10-CM

## 2018-02-22 DIAGNOSIS — G8929 Other chronic pain: Secondary | ICD-10-CM

## 2018-02-22 NOTE — Therapy (Signed)
[place] MAIN [facility] SERVICES [address] [place], [place], [address] Phone: [phone]   Fax:  [phone]  Physical Therapy Treatment  Patient Details  Name: Charles Choi MRN: [id number] Date of Birth: 09-05-1951 Referring Provider: Fulton Reek D    Encounter Date: 02/22/2018  PT End of Session - 02/22/18 1606    Visit Number  23    Number of Visits  33    Date for PT Re-Evaluation  03/05/18    PT Start Time  1600    PT Stop Time  1645    PT Time Calculation (min)  45 min    Equipment Utilized During Treatment  Gait belt    Activity Tolerance  Patient limited by fatigue    Behavior During Therapy  [facility] for tasks assessed/performed       Past Medical History:  Diagnosis Date  . Anxiety   . Cancer ([place])   . Depression   . Hypertension   . Parkinson's disease [facility])     Past Surgical History:  Procedure Laterality Date  . BACK SURGERY    . CHOLECYSTECTOMY    . DEEP BRAIN STIMULATOR PLACEMENT     for Parkinson's disease  . JOINT REPLACEMENT     left knee x2    There were no vitals filed for this visit.  Subjective Assessment - 02/22/18 1605    Subjective  Patient is doing well today.    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  Had PT at [facility] for 7 days.  Used the bike 6-10 minutes, as well as worked on walking, ROM for his R knee.  Started walking with a rw and is currently using a rollator walker.  Pt also adds that he was in a wc since October 2017 until the surgery due to pain. Patient has been on antiobiotic now due to the left knee being infected. The MD will test him in april to see if he continues to have an infection and if he needs to have any more surgery on the left knee. Patients baseline for ambulation is that he was able to ambulate with a spc outdoors. He had PT in July for strengthening to be able to walk with a cane instead of the rollator.     Limitations  Walking;Standing    Patient Stated Goals  I want to walk without a walker, improve endurance.    Currently in Pain?  No/denies    Pain Score  0-No pain       Treatment:: Warm up on Nustep BUE/BLE level 2 x5 min (Unbilled); Sit<>Stand from low mat table with small ball in hands,2x5 with cues to increase push through LE for better LE strengthening;Patient had increased difficulty with increased repetition; Standing with knee extension with GTB last 30 deg x 20  Tall kneeling ; leaning back eccentric control x 15  Quadriped over a theraball and LE extension with 5 sec hold x 10 SAQ with 2 lbs x 15 x 2  Leg press::BLE plate120# 2x12with cues for positioning to improve LE strengthening and motor control;  Hamstring curl, green tband x10 bilaterally with min Vcs to increase ROM with knee flexion for better strengthening;  Patient required min-moderate verbal/tactile cues for correct exercise techniqueincluding cues to improve posture, improve foot position, and reduce rail assist for better balance challenge; Patient often distracted and requires increased cues for correct activity; Min cueing needed  to appropriately perform  tasks with leg, hand, and head position. Decreased coordination demonstrated requiring consistent verbal cueing to correct form.  Patient continues to demonstrate some in coordination of movement with select exercises    Gait training: TM . 7 miles / hour x 10 mins SPC 200 feet x 2 with CGA   Cues for posture and heel toe gait.                     PT Education - 02/22/18 1606    Education provided  Yes    Education Details  HEP    Person(s) Educated  Patient    Methods  Explanation    Comprehension  Verbalized understanding       PT Short Term Goals - 11/14/17 0848      PT SHORT TERM GOAL #1   Title  Pt will be able to perform sit <> stand transfer from a regular chair independently without UE assist to promote function.      Baseline  needs UE support    Time  4    Period  Weeks    Status  New    Target Date  12/11/17      PT SHORT TERM GOAL #2   Title  Patient will be able to stand at counter for 15 minutes to be able to complete a standing activity    Baseline  Patient has limited standing time with UE support and AD    Time  4    Period  Weeks    Status  New    Target Date  12/11/17        PT Long Term Goals - 02/20/18 1713      PT LONG TERM GOAL #1   Title  Patient will be independent in home exercise program to improve strength/mobility for better functional independence with ADLs    Baseline  stretching and strengthening    Time  8    Period  Weeks    Status  On-going    Target Date  03/05/18      PT LONG TERM GOAL #2   Title  Patient will ascend/descend 4 stairs with rail assist independently and ability to bring his rollator to the top or bottom of the steps , without loss of balance to improve ability to get in/out of home    Baseline  Patient is able to ascend and descend steps with railing and CGA    Time  8    Period  Weeks    Status  On-going    Target Date  03/05/18      PT LONG TERM GOAL #3   Title  Patient will increase 10 meter walk test to >1.59m/s as to improve gait speed for better community ambulation and to reduce fall risk.    Baseline  1.31 m/sec ;01/15/18= 1.31 m/sec, 02/20/18 1.31 m/sec    Time  8    Period  Weeks    Status  On-going    Target Date  03/05/18      PT LONG TERM GOAL #4   Title  Patient (> 78 years old) will complete five times sit to stand test in < 15 seconds indicating an increased LE strength and improved balance.    Baseline  18.22 sec,  01/15/18=  18.22, 02/20/17 =18.22 sec    Time  8    Period  Weeks    Status  On-going    Target Date  03/05/18      PT LONG TERM GOAL #5   Title  Patient will ambulate 50 feet indoors with spc without falls or using the furniture for support.    Baseline  spc 75 % of the time indoors    Time  8    Period   Weeks    Status  On-going    Target Date  02/11/18            Plan - 02/22/18 1606    Clinical Impression Statement  Patient required min verbal cueing during strengthening exercise to correct posture and form. Patient demonstrates ability to perform strengthening exercises with no pain but with moderate fatigue. Patient will continue to benefit from continued skilled therapy in order to improve dynamic standing balance and increase strength in order to decrease falls    Rehab Potential  Good    Clinical Impairments Affecting Rehab Potential  chronicity of condition, Parkinson's disease, TKR BLE    PT Frequency  2x / week    PT Duration  8 weeks    PT Treatment/Interventions  Neuromuscular re-education;Manual techniques;Therapeutic exercise;Therapeutic activities;Aquatic Therapy;Gait training;Patient/family education;Cryotherapy;Stair training;Balance training;Dry needling;Taping    PT Next Visit Plan  Knee extension ROM, gait, functional strengthening, balance training    PT Home Exercise Plan  needs HEP next visit, 6 MW test, TUG test,     Consulted and Agree with Plan of Care  Patient       Patient will benefit from skilled therapeutic intervention in order to improve the following deficits and impairments:  Pain, Postural dysfunction, Improper body mechanics, Difficulty walking, Decreased strength, Decreased range of motion, Abnormal gait, Decreased balance, Decreased endurance, Decreased mobility, Impaired sensation, Decreased activity tolerance, Impaired flexibility  Visit Diagnosis: Muscle weakness (generalized)  Difficulty in walking, not elsewhere classified  Chronic pain of right knee     Problem List Patient Active Problem List   Diagnosis Date Noted  . Moderate recurrent major depression ([place]) 08/15/2016  . Chest pain due to myocardial ischemia 08/14/2016  . Chest pain, rule out acute myocardial infarction 08/14/2016    Arelia Sneddon S,PT DPT 02/22/2018,  4:18 PM  [place] MAIN [facility] SERVICES [address] [place], [place], [address] Phone: [phone]   Fax:  [phone]  Name: CARA AGUINO MRN: [id number] Date of Birth: 1951/07/26

## 2018-02-26 ENCOUNTER — Ambulatory Visit: Payer: Medicare Other | Admitting: Physical Therapy

## 2018-02-28 ENCOUNTER — Ambulatory Visit: Payer: Medicare Other | Admitting: Physical Therapy

## 2018-02-28 ENCOUNTER — Encounter: Payer: Self-pay | Admitting: Physical Therapy

## 2018-02-28 DIAGNOSIS — G8929 Other chronic pain: Secondary | ICD-10-CM

## 2018-02-28 DIAGNOSIS — M6281 Muscle weakness (generalized): Secondary | ICD-10-CM | POA: Diagnosis not present

## 2018-02-28 DIAGNOSIS — M25561 Pain in right knee: Secondary | ICD-10-CM

## 2018-02-28 DIAGNOSIS — R262 Difficulty in walking, not elsewhere classified: Secondary | ICD-10-CM

## 2018-02-28 NOTE — Therapy (Signed)
[place] MAIN [facility] SERVICES [address] [place], [place], [address] Phone: [phone]   Fax:  [phone]  Physical Therapy Treatment  Patient Details  Name: OREOLUWA AIGNER MRN: [id number] Date of Birth: 1951/01/25 Referring Provider: Fulton Reek D    Encounter Date: 02/28/2018  PT End of Session - 02/28/18 1609    Visit Number  24    Number of Visits  33    Date for PT Re-Evaluation  03/05/18    PT Start Time  1600    PT Stop Time  0145    PT Time Calculation (min)  585 min    Equipment Utilized During Treatment  Gait belt    Activity Tolerance  Patient limited by fatigue    Behavior During Therapy  [facility] for tasks assessed/performed       Past Medical History:  Diagnosis Date  . Anxiety   . Cancer ([place])   . Depression   . Hypertension   . Parkinson's disease [facility])     Past Surgical History:  Procedure Laterality Date  . BACK SURGERY    . CHOLECYSTECTOMY    . DEEP BRAIN STIMULATOR PLACEMENT     for Parkinson's disease  . JOINT REPLACEMENT     left knee x2    There were no vitals filed for this visit.  Subjective Assessment - 02/28/18 1608    Subjective  Patient is doing well today.    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  Had PT at [facility] for 7 days.  Used the bike 6-10 minutes, as well as worked on walking, ROM for his R knee.  Started walking with a rw and is currently using a rollator walker.  Pt also adds that he was in a wc since October 2017 until the surgery due to pain. Patient has been on antiobiotic now due to the left knee being infected. The MD will test him in april to see if he continues to have an infection and if he needs to have any more surgery on the left knee. Patients baseline for ambulation is that he was able to ambulate with a spc outdoors. He had PT in July for strengthening to be able to walk with a cane instead of the rollator.     Limitations  Walking;Standing    Patient Stated Goals  I want to walk without a walker, improve endurance.    Currently in Pain?  No/denies    Pain Score  0-No pain    Pain Onset  More than a month ago    Multiple Pain Sites  No       NEUROMUSCULAR RE-EDUCATION  Toe tapping 6 inch stool without UE assist, cues for technique Tandem gait in // bars x 4 laps , posture correction cues Side stepping on blue  foam balance beam x 5 lengths of the parallel bars with posture correction cues Side stepping with cues to not rotate hips and for correct head position Stepping onto AIREX, then on to 4 inch step followed by stepping down onto AIREX and then level surface in //bars x15.  Pt required occasional UE assist, and performance improved with each repetition   Leg press x 130 lbs x 20 x 2 Stepping over and back x10 bilaterally  with cues for posture and correct technique Stepping over and back x`10 bilaterally with Verbal cueing  Bridging x 20  Side plank x  30 sec x 3 Cues for proper technique of exercises, slow eccentric contractions to target specific muscles and facility increased muscle building. Therapeutic rest breaks for energy conservation                        PT Education - 02/28/18 1609    Education provided  Yes    Education Details  HEP    Person(s) Educated  Patient    Methods  Explanation;Demonstration    Comprehension  Verbalized understanding;Returned demonstration       PT Short Term Goals - 11/14/17 0848      PT SHORT TERM GOAL #1   Title  Pt will be able to perform sit <> stand transfer from a regular chair independently without UE assist to promote function.     Baseline  needs UE support    Time  4    Period  Weeks    Status  New    Target Date  12/11/17      PT SHORT TERM GOAL #2   Title  Patient will be able to stand at counter for 15 minutes to be able to complete a standing activity    Baseline  Patient has limited standing time with  UE support and AD    Time  4    Period  Weeks    Status  New    Target Date  12/11/17        PT Long Term Goals - 02/20/18 1713      PT LONG TERM GOAL #1   Title  Patient will be independent in home exercise program to improve strength/mobility for better functional independence with ADLs    Baseline  stretching and strengthening    Time  8    Period  Weeks    Status  On-going    Target Date  03/05/18      PT LONG TERM GOAL #2   Title  Patient will ascend/descend 4 stairs with rail assist independently and ability to bring his rollator to the top or bottom of the steps , without loss of balance to improve ability to get in/out of home    Baseline  Patient is able to ascend and descend steps with railing and CGA    Time  8    Period  Weeks    Status  On-going    Target Date  03/05/18      PT LONG TERM GOAL #3   Title  Patient will increase 10 meter walk test to >1.10m/s as to improve gait speed for better community ambulation and to reduce fall risk.    Baseline  1.31 m/sec ;01/15/18= 1.31 m/sec, 02/20/18 1.31 m/sec    Time  8    Period  Weeks    Status  On-going    Target Date  03/05/18      PT LONG TERM GOAL #4   Title  Patient (> 100 years old) will complete five times sit to stand test in < 15 seconds indicating an increased LE strength and improved balance.    Baseline  18.22 sec,  01/15/18=  18.22, 02/20/17 =18.22 sec    Time  8    Period  Weeks    Status  On-going    Target Date  03/05/18      PT LONG TERM GOAL #5   Title  Patient will ambulate 50 feet indoors with spc without falls or using the furniture for support.  Baseline  spc 75 % of the time indoors    Time  8    Period  Weeks    Status  On-going    Target Date  02/11/18            Plan - 02/28/18 1610    Clinical Impression Statement  Patient demonstrates LE weakness, and impaired mobility and lack of  LE strength   that is causing difficulty with transfers and gait activities.  Patient still  fatigues quickly to due very weak glut and quad muscles. Patient showed poor form and technique and is able to generate good form after cueing Patient is able to perform exercises that were not possible several weeks ago . Patient will continue to benefit from skilled physical therapy to improve BLE strength and mobiltiy to improve quality of life.     Rehab Potential  Good    Clinical Impairments Affecting Rehab Potential  chronicity of condition, Parkinson's disease, TKR BLE    PT Frequency  2x / week    PT Duration  8 weeks    PT Treatment/Interventions  Neuromuscular re-education;Manual techniques;Therapeutic exercise;Therapeutic activities;Aquatic Therapy;Gait training;Patient/family education;Cryotherapy;Stair training;Balance training;Dry needling;Taping    PT Next Visit Plan  Knee extension ROM, gait, functional strengthening, balance training    PT Home Exercise Plan  needs HEP next visit, 6 MW test, TUG test,     Consulted and Agree with Plan of Care  Patient       Patient will benefit from skilled therapeutic intervention in order to improve the following deficits and impairments:  Pain, Postural dysfunction, Improper body mechanics, Difficulty walking, Decreased strength, Decreased range of motion, Abnormal gait, Decreased balance, Decreased endurance, Decreased mobility, Impaired sensation, Decreased activity tolerance, Impaired flexibility  Visit Diagnosis: Muscle weakness (generalized)  Difficulty in walking, not elsewhere classified  Chronic pain of right knee     Problem List Patient Active Problem List   Diagnosis Date Noted  . Moderate recurrent major depression ([place]) 08/15/2016  . Chest pain due to myocardial ischemia 08/14/2016  . Chest pain, rule out acute myocardial infarction 08/14/2016    Alanson Puls, [place] DPT 02/28/2018, 4:12 PM  [place] MAIN [facility] SERVICES [place], [place], [address] Phone:  [phone]   Fax:  [phone]  Name: BRAEDIN MILLHOUSE MRN: [id number] Date of Birth: May 04, 1951

## 2018-03-05 ENCOUNTER — Ambulatory Visit: Payer: Medicare Other | Admitting: Physical Therapy

## 2018-03-05 ENCOUNTER — Encounter: Payer: Self-pay | Admitting: Physical Therapy

## 2018-03-05 DIAGNOSIS — G8929 Other chronic pain: Secondary | ICD-10-CM

## 2018-03-05 DIAGNOSIS — M6281 Muscle weakness (generalized): Secondary | ICD-10-CM

## 2018-03-05 DIAGNOSIS — M25561 Pain in right knee: Secondary | ICD-10-CM

## 2018-03-05 DIAGNOSIS — R262 Difficulty in walking, not elsewhere classified: Secondary | ICD-10-CM

## 2018-03-05 NOTE — Therapy (Signed)
[place] MAIN [facility] SERVICES [address] [place], [place], [address] Phone: [phone]   Fax:  [phone]  Physical Therapy Treatment  Patient Details  Name: Charles Choi MRN: [id number] Date of Birth: Oct 01, 1951 Referring Provider: Fulton Reek D    Encounter Date: 03/05/2018  PT End of Session - 03/05/18 1610    Visit Number  25    Number of Visits  33    Date for PT Re-Evaluation  04/25/18    PT Start Time  1600    PT Stop Time  1640    PT Time Calculation (min)  40 min    Equipment Utilized During Treatment  Gait belt    Activity Tolerance  Patient limited by fatigue    Behavior During Therapy  [facility] for tasks assessed/performed       Past Medical History:  Diagnosis Date  . Anxiety   . Cancer ([place])   . Depression   . Hypertension   . Parkinson's disease [facility])     Past Surgical History:  Procedure Laterality Date  . BACK SURGERY    . CHOLECYSTECTOMY    . DEEP BRAIN STIMULATOR PLACEMENT     for Parkinson's disease  . JOINT REPLACEMENT     left knee x2    There were no vitals filed for this visit.  Subjective Assessment - 03/05/18 1605    Subjective  Patient is doing well today. He did not sleep well last night. He is walking more and reports doing his stretching.    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  Had PT at [facility] for 7 days.  Used the bike 6-10 minutes, as well as worked on walking, ROM for his R knee.  Started walking with a rw and is currently using a rollator walker.  Pt also adds that he was in a wc since October 2017 until the surgery due to pain. Patient has been on antiobiotic now due to the left knee being infected. The MD will test him in april to see if he continues to have an infection and if he needs to have any more surgery on the left knee. Patients baseline for ambulation is that he was able to ambulate with a spc outdoors. He had PT  in July for strengthening to be able to walk with a cane instead of the rollator.     Limitations  Walking;Standing    Patient Stated Goals  I want to walk without a walker, improve endurance.    Currently in Pain?  Yes    Pain Score  3     Pain Location  Shoulder    Pain Orientation  Left    Pain Descriptors / Indicators  Aching    Pain Type  Chronic pain    Pain Onset  More than a month ago    Pain Frequency  Constant    Aggravating Factors   with activity    Pain Relieving Factors  resting    Effect of Pain on Daily Activities  unable to do as much    Multiple Pain Sites  -- right shoulder 3/10         Therapeutic exercise:  Nu-step x 5 mins  Step ups to 6 inch stool up and then over the other side of the stool and then back step back to stool and repeat x 10   Rocker board fwd/ bwd, side  to side x 20 taps each way and CGA, VC for posture  Standing with red therball under knee , pushing to wall 10 sec on , 5 sec off x 10 BLE  Leg press 130 lbs x 20 x 3 , vC for not snapping knee  TM walking x 10 mins , heel toe gait and knee squeeze x ten mins . 7 miles / hour, VC for posture   CGA for all activities and min vc for correct technique                       PT Education - 03/05/18 1607    Education provided  Yes    Education Details  HEP    Person(s) Educated  Patient    Methods  Explanation;Demonstration;Tactile cues    Comprehension  Verbalized understanding;Returned demonstration       PT Short Term Goals - 11/14/17 0848      PT SHORT TERM GOAL #1   Title  Pt will be able to perform sit <> stand transfer from a regular chair independently without UE assist to promote function.     Baseline  needs UE support    Time  4    Period  Weeks    Status  New    Target Date  12/11/17      PT SHORT TERM GOAL #2   Title  Patient will be able to stand at counter for 15 minutes to be able to complete a standing activity    Baseline  Patient has  limited standing time with UE support and AD    Time  4    Period  Weeks    Status  New    Target Date  12/11/17        PT Long Term Goals - 02/20/18 1713      PT LONG TERM GOAL #1   Title  Patient will be independent in home exercise program to improve strength/mobility for better functional independence with ADLs    Baseline  stretching and strengthening    Time  8    Period  Weeks    Status  On-going    Target Date  03/05/18      PT LONG TERM GOAL #2   Title  Patient will ascend/descend 4 stairs with rail assist independently and ability to bring his rollator to the top or bottom of the steps , without loss of balance to improve ability to get in/out of home    Baseline  Patient is able to ascend and descend steps with railing and CGA    Time  8    Period  Weeks    Status  On-going    Target Date  03/05/18      PT LONG TERM GOAL #3   Title  Patient will increase 10 meter walk test to >1.29m/s as to improve gait speed for better community ambulation and to reduce fall risk.    Baseline  1.31 m/sec ;01/15/18= 1.31 m/sec, 02/20/18 1.31 m/sec    Time  8    Period  Weeks    Status  On-going    Target Date  03/05/18      PT LONG TERM GOAL #4   Title  Patient (> 109 years old) will complete five times sit to stand test in < 15 seconds indicating an increased LE strength and improved balance.    Baseline  18.22 sec,  01/15/18=  18.22, 02/20/17 =18.22  sec    Time  8    Period  Weeks    Status  On-going    Target Date  03/05/18      PT LONG TERM GOAL #5   Title  Patient will ambulate 50 feet indoors with spc without falls or using the furniture for support.    Baseline  spc 75 % of the time indoors    Time  8    Period  Weeks    Status  On-going    Target Date  02/11/18            Plan - 03/05/18 1611    Clinical Impression Statement  Mod cueing needed to appropriately perform balance tasks with leg, hand, and head position. Decreased coordination demonstrated requiring  consistent verbal cueing to correct form. Patient continues to demonstrate some in coordination of movement with select exercises such as weight shifting balance board and Baps board. Patient responds well to verbal and tactile cues to correct form and technique. CGA to SBA for safety with activities. Cues to increase intensity and amplitude of movements throughout session    Rehab Potential  Good    Clinical Impairments Affecting Rehab Potential  chronicity of condition, Parkinson's disease, TKR BLE    PT Frequency  2x / week    PT Duration  8 weeks    PT Treatment/Interventions  Neuromuscular re-education;Manual techniques;Therapeutic exercise;Therapeutic activities;Aquatic Therapy;Gait training;Patient/family education;Cryotherapy;Stair training;Balance training;Dry needling;Taping    PT Next Visit Plan  Knee extension ROM, gait, functional strengthening, balance training    PT Home Exercise Plan  needs HEP next visit, 6 MW test, TUG test,     Consulted and Agree with Plan of Care  Patient       Patient will benefit from skilled therapeutic intervention in order to improve the following deficits and impairments:  Pain, Postural dysfunction, Improper body mechanics, Difficulty walking, Decreased strength, Decreased range of motion, Abnormal gait, Decreased balance, Decreased endurance, Decreased mobility, Impaired sensation, Decreased activity tolerance, Impaired flexibility  Visit Diagnosis: Muscle weakness (generalized)  Difficulty in walking, not elsewhere classified  Chronic pain of right knee     Problem List Patient Active Problem List   Diagnosis Date Noted  . Moderate recurrent major depression ([place]) 08/15/2016  . Chest pain due to myocardial ischemia 08/14/2016  . Chest pain, rule out acute myocardial infarction 08/14/2016    Alanson Puls, [place] DPT 03/05/2018, 4:13 PM  [place] MAIN [facility] SERVICES [address]  [place], [place], [address] Phone: [phone]   Fax:  [phone]  Name: MCKADE GURKA MRN: [id number] Date of Birth: 03-Sep-1951

## 2018-03-07 ENCOUNTER — Ambulatory Visit: Payer: Medicare Other | Attending: Internal Medicine | Admitting: Physical Therapy

## 2018-03-07 ENCOUNTER — Encounter: Payer: Self-pay | Admitting: Physical Therapy

## 2018-03-07 DIAGNOSIS — M25561 Pain in right knee: Secondary | ICD-10-CM | POA: Insufficient documentation

## 2018-03-07 DIAGNOSIS — M6281 Muscle weakness (generalized): Secondary | ICD-10-CM | POA: Insufficient documentation

## 2018-03-07 DIAGNOSIS — R262 Difficulty in walking, not elsewhere classified: Secondary | ICD-10-CM | POA: Diagnosis present

## 2018-03-07 DIAGNOSIS — G8929 Other chronic pain: Secondary | ICD-10-CM | POA: Diagnosis present

## 2018-03-07 NOTE — Therapy (Addendum)
[place] MAIN [facility] SERVICES [address] [place], [place], [address] Phone: [phone]   Fax:  [phone]  Physical Therapy Treatment  Patient Details  Name: Charles Choi MRN: [id number] Date of Birth: 28-Dec-1950 Referring Provider: Fulton Reek D    Encounter Date: 03/07/2018  PT End of Session - 03/07/18 1511    Visit Number  26    Number of Visits  33    Date for PT Re-Evaluation  04/25/18    PT Start Time  0305    PT Stop Time  0345    PT Time Calculation (min)  40 min    Equipment Utilized During Treatment  Gait belt    Activity Tolerance  Patient limited by fatigue    Behavior During Therapy  [facility] for tasks assessed/performed       Past Medical History:  Diagnosis Date  . Anxiety   . Cancer ([place])   . Depression   . Hypertension   . Parkinson's disease [facility])     Past Surgical History:  Procedure Laterality Date  . BACK SURGERY    . CHOLECYSTECTOMY    . DEEP BRAIN STIMULATOR PLACEMENT     for Parkinson's disease  . JOINT REPLACEMENT     left knee x2    There were no vitals filed for this visit.  Subjective Assessment - 03/07/18 1508    Subjective  Patient is doing well today. He did not sleep well last night. He is walking more and reports doing his stretching.    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  Had PT at [facility] for 7 days.  Used the bike 6-10 minutes, as well as worked on walking, ROM for his R knee.  Started walking with a rw and is currently using a rollator walker.  Pt also adds that he was in a wc since October 2017 until the surgery due to pain. Patient has been on antiobiotic now due to the left knee being infected. The MD will test him in april to see if he continues to have an infection and if he needs to have any more surgery on the left knee. Patients baseline for ambulation is that he was able to ambulate with a spc outdoors. He had PT in  July for strengthening to be able to walk with a cane instead of the rollator.     Limitations  Walking;Standing    Patient Stated Goals  I want to walk without a walker, improve endurance.    Currently in Pain?  No/denies    Pain Score  0-No pain    Pain Onset  More than a month ago       Neuromuscular training:  Nu-step x 5 mins  TM at .9 miles / hour x 8 mins with UE support and decreased step length and decreased step height and SBA   Stepping onto AIREX, then on to 4 inch step followed by stepping down onto AIREX and then level surface in //bars x15.  Pt required occasional UE assist, and performance improved with each repetition    Stepping over 1/2 foam and back x10 bilaterally  , CGA  Side step and back x10 bilaterally  Side stepping on blue foam beam x 5   Leg press  With  130# x 20 x 2 sets  sit to stand 2x10    Mini squats in //bars x 15 x 2  Eccentric step downs from 6 inch stool x 5 left and right x 4 sets     CGA and Min  verbal cues used throughout with increased in postural sway and LOB most seen with narrow base of support and while on uneven surfaces. Continues to have balance deficits typical with diagnosis. Patient performs intermediate level exercises without pain behaviors                    PT Education - 03/07/18 1510    Education provided  Yes    Education Details  HEP    Person(s) Educated  Patient    Methods  Explanation;Demonstration;Tactile cues    Comprehension  Verbalized understanding;Returned demonstration       PT Short Term Goals - 11/14/17 0848      PT SHORT TERM GOAL #1   Title  Pt will be able to perform sit <> stand transfer from a regular chair independently without UE assist to promote function.     Baseline  needs UE support    Time  4    Period  Weeks    Status  New    Target Date  12/11/17      PT SHORT TERM GOAL #2   Title  Patient will be able to stand at counter for 15 minutes to be able to complete  a standing activity    Baseline  Patient has limited standing time with UE support and AD    Time  4    Period  Weeks    Status  New    Target Date  12/11/17        PT Long Term Goals - 02/20/18 1713      PT LONG TERM GOAL #1   Title  Patient will be independent in home exercise program to improve strength/mobility for better functional independence with ADLs    Baseline  stretching and strengthening    Time  8    Period  Weeks    Status  On-going    Target Date  03/05/18      PT LONG TERM GOAL #2   Title  Patient will ascend/descend 4 stairs with rail assist independently and ability to bring his rollator to the top or bottom of the steps , without loss of balance to improve ability to get in/out of home    Baseline  Patient is able to ascend and descend steps with railing and CGA    Time  8    Period  Weeks    Status  On-going    Target Date  03/05/18      PT LONG TERM GOAL #3   Title  Patient will increase 10 meter walk test to >1.61m/s as to improve gait speed for better community ambulation and to reduce fall risk.    Baseline  1.31 m/sec ;01/15/18= 1.31 m/sec, 02/20/18 1.31 m/sec    Time  8    Period  Weeks    Status  On-going    Target Date  03/05/18      PT LONG TERM GOAL #4   Title  Patient (> 47 years old) will complete five times sit to stand test in < 15 seconds indicating an increased LE strength and improved balance.    Baseline  18.22 sec,  01/15/18=  18.22, 02/20/17 =18.22 sec    Time  8    Period  Weeks    Status  On-going    Target Date  03/05/18      PT LONG TERM GOAL #5   Title  Patient will ambulate 50 feet indoors with spc without falls or using the furniture for support.    Baseline  spc 75 % of the time indoors    Time  8    Period  Weeks    Status  On-going    Target Date  02/11/18            Plan - 03/07/18 1512    Clinical Impression Statement  Patient   instructed in gait training on level surfaces on  TM with rest periods standing.  Patient is able to perform intermediate LE exercises in supine and standing open and closed chain, with minimal pain behaviors and resting throughout session. Patient performs dynamic standing balance training with uneven surfaces with CGA. Patient will continue to benefit from skilled PT to improve strength, balance and gait. Patient continues to have LE weakness   and decreased static and dynamic standing balance.    Rehab Potential  Good    Clinical Impairments Affecting Rehab Potential  chronicity of condition, Parkinson's disease, TKR BLE    PT Frequency  2x / week    PT Duration  8 weeks    PT Treatment/Interventions  Neuromuscular re-education;Manual techniques;Therapeutic exercise;Therapeutic activities;Aquatic Therapy;Gait training;Patient/family education;Cryotherapy;Stair training;Balance training;Dry needling;Taping    PT Next Visit Plan  Knee extension ROM, gait, functional strengthening, balance training    PT Home Exercise Plan  needs HEP next visit, 6 MW test, TUG test,     Consulted and Agree with Plan of Care  Patient       Patient will benefit from skilled therapeutic intervention in order to improve the following deficits and impairments:  Pain, Postural dysfunction, Improper body mechanics, Difficulty walking, Decreased strength, Decreased range of motion, Abnormal gait, Decreased balance, Decreased endurance, Decreased mobility, Impaired sensation, Decreased activity tolerance, Impaired flexibility  Visit Diagnosis: Muscle weakness (generalized)  Difficulty in walking, not elsewhere classified  Chronic pain of right knee     Problem List Patient Active Problem List   Diagnosis Date Noted  . Moderate recurrent major depression ([place]) 08/15/2016  . Chest pain due to myocardial ischemia 08/14/2016  . Chest pain, rule out acute myocardial infarction 08/14/2016    Arelia Sneddon S,PT DPT 03/07/2018, 3:17 PM  [place] MAIN  [facility] SERVICES [address] [place], [place], [address] Phone: [phone]   Fax:  [phone]  Name: Charles Choi MRN: [id number] Date of Birth: 1950/11/28

## 2018-03-14 ENCOUNTER — Encounter: Payer: Self-pay | Admitting: Physical Therapy

## 2018-03-14 ENCOUNTER — Ambulatory Visit: Payer: Medicare Other | Admitting: Physical Therapy

## 2018-03-14 DIAGNOSIS — M6281 Muscle weakness (generalized): Secondary | ICD-10-CM

## 2018-03-14 DIAGNOSIS — M25561 Pain in right knee: Secondary | ICD-10-CM

## 2018-03-14 DIAGNOSIS — G8929 Other chronic pain: Secondary | ICD-10-CM

## 2018-03-14 DIAGNOSIS — R262 Difficulty in walking, not elsewhere classified: Secondary | ICD-10-CM

## 2018-03-14 NOTE — Therapy (Signed)
[place] MAIN [facility] SERVICES [address] [place], [place], [address] Phone: [phone]   Fax:  [phone]  Physical Therapy Treatment  Patient Details  Name: Charles Choi MRN: [id number] Date of Birth: [birth year] Referring Provider: Fulton Reek D    Encounter Date: 03/14/2018  PT End of Session - 03/14/18 1517    Visit Number  27    Number of Visits  33    Date for PT Re-Evaluation  04/25/18    PT Start Time  0308    PT Stop Time  0346    PT Time Calculation (min)  38 min    Equipment Utilized During Treatment  Gait belt    Activity Tolerance  Patient limited by fatigue    Behavior During Therapy  [facility] for tasks assessed/performed       Past Medical History:  Diagnosis Date  . Anxiety   . Cancer ([place])   . Depression   . Hypertension   . Parkinson's disease [facility])     Past Surgical History:  Procedure Laterality Date  . BACK SURGERY    . CHOLECYSTECTOMY    . DEEP BRAIN STIMULATOR PLACEMENT     for Parkinson's disease  . JOINT REPLACEMENT     left knee x2    There were no vitals filed for this visit.  Subjective Assessment - 03/14/18 1516    Subjective  Patient is doing well today. He did not sleep well last night. He is walking more and reports doing his stretching.He walked a mile at home for his HEP.     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  Had PT at [facility] for 7 days.  Used the bike 6-10 minutes, as well as worked on walking, ROM for his R knee.  Started walking with a rw and is currently using a rollator walker.  Pt also adds that he was in a wc since October 2017 until the surgery due to pain. Patient has been on antiobiotic now due to the left knee being infected. The MD will test him in april to see if he continues to have an infection and if he needs to have any more surgery on the left knee. Patients baseline for ambulation is that he was able to  ambulate with a spc outdoors. He had PT in July for strengthening to be able to walk with a cane instead of the rollator.     Limitations  Walking;Standing    Patient Stated Goals  I want to walk without a walker, improve endurance.    Currently in Pain?  Yes    Pain Score  1     Pain Location  Foot    Pain Orientation  Right    Pain Descriptors / Indicators  Aching    Pain Type  Chronic pain    Pain Onset  More than a month ago    Multiple Pain Sites  No        NEUROMUSCULAR RE-EDUCATION  Alternate toe taps on 4 inch step without rail assist x15 with cues to step backwards for better foot placement and avoid toe catching;  Standing one foot on airex, one foot on 4 inch step with BUE ball pass side/side x10 each foot on step with min A for safety and cues to improve trunk rotation for better balance challenge;  Modified tandem stance with BUE ball up/down x10 each foot in  front with CGA for safety;  Airex NBOS eyes open/closed x 30 seconds each; cues for posture correction Airex NBOS eyes open horizontal and vertical head turns x 30 seconds; cues for posture correction Airex cone reaching crossing midline, reaching and stacking cones on low surface x 2 mins cues for looking up  Toe tapping 6 inch stool without UE assist, cues for technique and posture x 20  Tandem gait in // bars x 4 laps, posture correction cues Sidestepping on blue foam balance beam x 5 lengths of the parallel bars with posture correction cues  Step-ups to 6" step x 10 bilateral; cues for getting entire foot on step Quantum leg press 100 # x 20 x 3; cues for not snapping LE in extension and performing slowly 3 way hip with YTB and cues for trunk position and technique                     PT Education - 03/14/18 1517    Education provided  Yes    Education Details  HEP    Person(s) Educated  Patient    Methods  Explanation    Comprehension  Verbalized understanding       PT Short Term  Goals - 11/14/17 0848      PT SHORT TERM GOAL #1   Title  Pt will be able to perform sit <> stand transfer from a regular chair independently without UE assist to promote function.     Baseline  needs UE support    Time  4    Period  Weeks    Status  New    Target Date  12/11/17      PT SHORT TERM GOAL #2   Title  Patient will be able to stand at counter for 15 minutes to be able to complete a standing activity    Baseline  Patient has limited standing time with UE support and AD    Time  4    Period  Weeks    Status  New    Target Date  12/11/17        PT Long Term Goals - 02/20/18 1713      PT LONG TERM GOAL #1   Title  Patient will be independent in home exercise program to improve strength/mobility for better functional independence with ADLs    Baseline  stretching and strengthening    Time  8    Period  Weeks    Status  On-going    Target Date  03/05/18      PT LONG TERM GOAL #2   Title  Patient will ascend/descend 4 stairs with rail assist independently and ability to bring his rollator to the top or bottom of the steps , without loss of balance to improve ability to get in/out of home    Baseline  Patient is able to ascend and descend steps with railing and CGA    Time  8    Period  Weeks    Status  On-going    Target Date  03/05/18      PT LONG TERM GOAL #3   Title  Patient will increase 10 meter walk test to >1.5m/s as to improve gait speed for better community ambulation and to reduce fall risk.    Baseline  1.31 m/sec ;01/15/18= 1.31 m/sec, 02/20/18 1.31 m/sec    Time  8    Period  Weeks    Status  On-going  Target Date  03/05/18      PT LONG TERM GOAL #4   Title  Patient (> 41 years old) will complete five times sit to stand test in < 15 seconds indicating an increased LE strength and improved balance.    Baseline  18.22 sec,  01/15/18=  18.22, 02/20/17 =18.22 sec    Time  8    Period  Weeks    Status  On-going    Target Date  03/05/18      PT LONG  TERM GOAL #5   Title  Patient will ambulate 50 feet indoors with spc without falls or using the furniture for support.    Baseline  spc 75 % of the time indoors    Time  8    Period  Weeks    Status  On-going    Target Date  02/11/18            Plan - 03/14/18 1524    Clinical Impression Statement  Pt presents with unsteadiness on uneven surfaces and fatigues with therapeutic exercises. Patient needs assist with instruction for balance with side stepping on uneven surfaces,  and needs CGA assist with balance activities. Patient demonstrates difficulty with side stepping  and navigating small spaces with decreased base of support and increased challenges for LE.  Patient tolerated all interventions well this date and will benefit from continued skilled PT interventions to improve strength and balance and decrease risk of falling    Rehab Potential  Good    Clinical Impairments Affecting Rehab Potential  chronicity of condition, Parkinson's disease, TKR BLE    PT Frequency  2x / week    PT Duration  8 weeks    PT Treatment/Interventions  Neuromuscular re-education;Manual techniques;Therapeutic exercise;Therapeutic activities;Aquatic Therapy;Gait training;Patient/family education;Cryotherapy;Stair training;Balance training;Dry needling;Taping    PT Next Visit Plan  Knee extension ROM, gait, functional strengthening, balance training    PT Home Exercise Plan  needs HEP next visit, 6 MW test, TUG test,     Consulted and Agree with Plan of Care  Patient       Patient will benefit from skilled therapeutic intervention in order to improve the following deficits and impairments:  Pain, Postural dysfunction, Improper body mechanics, Difficulty walking, Decreased strength, Decreased range of motion, Abnormal gait, Decreased balance, Decreased endurance, Decreased mobility, Impaired sensation, Decreased activity tolerance, Impaired flexibility  Visit Diagnosis: Muscle weakness  (generalized)  Difficulty in walking, not elsewhere classified  Chronic pain of right knee     Problem List Patient Active Problem List   Diagnosis Date Noted  . Moderate recurrent major depression ([place]) 08/15/2016  . Chest pain due to myocardial ischemia 08/14/2016  . Chest pain, rule out acute myocardial infarction 08/14/2016    Alanson Puls, [place] DPT 03/14/2018, 3:26 PM  [place] MAIN [facility] SERVICES [address] [place], [place], [address] Phone: [phone]   Fax:  [phone]  Name: Charles Choi MRN: [id number] Date of Birth: October 10, 1951

## 2018-03-20 ENCOUNTER — Encounter: Payer: Self-pay | Admitting: Physical Therapy

## 2018-03-20 ENCOUNTER — Ambulatory Visit: Payer: Medicare Other | Admitting: Physical Therapy

## 2018-03-20 DIAGNOSIS — M6281 Muscle weakness (generalized): Secondary | ICD-10-CM | POA: Diagnosis not present

## 2018-03-20 DIAGNOSIS — R262 Difficulty in walking, not elsewhere classified: Secondary | ICD-10-CM

## 2018-03-20 DIAGNOSIS — G8929 Other chronic pain: Secondary | ICD-10-CM

## 2018-03-20 DIAGNOSIS — M25561 Pain in right knee: Secondary | ICD-10-CM

## 2018-03-20 NOTE — Therapy (Signed)
[place] MAIN [facility] SERVICES [address] [place], [place], [address] Phone: [phone]   Fax:  [phone]  Physical Therapy Treatment  Patient Details  Name: Charles Choi MRN: [id number] Date of Birth: Mar 20, 1951 Referring Provider: Fulton Reek D    Encounter Date: 03/20/2018  PT End of Session - 03/20/18 1558    Visit Number  28    Number of Visits  33    Date for PT Re-Evaluation  04/25/18    PT Start Time  0350    PT Stop Time  0430    PT Time Calculation (min)  40 min    Equipment Utilized During Treatment  Gait belt    Activity Tolerance  Patient limited by fatigue    Behavior During Therapy  [facility] for tasks assessed/performed       Past Medical History:  Diagnosis Date  . Anxiety   . Cancer ([place])   . Depression   . Hypertension   . Parkinson's disease [facility])     Past Surgical History:  Procedure Laterality Date  . BACK SURGERY    . CHOLECYSTECTOMY    . DEEP BRAIN STIMULATOR PLACEMENT     for Parkinson's disease  . JOINT REPLACEMENT     left knee x2    There were no vitals filed for this visit.  Subjective Assessment - 03/20/18 1557    Subjective  Patient is doing well today. He forgot to take his AM meds but now he is back on schedule. His AFO is hurting and he is not able to wear it any more. He has a blister on his toe.     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  Had PT at [facility] for 7 days.  Used the bike 6-10 minutes, as well as worked on walking, ROM for his R knee.  Started walking with a rw and is currently using a rollator walker.  Pt also adds that he was in a wc since October 2017 until the surgery due to pain. Patient has been on antiobiotic now due to the left knee being infected. The MD will test him in april to see if he continues to have an infection and if he needs to have any more surgery on the left knee. Patients baseline for ambulation  is that he was able to ambulate with a spc outdoors. He had PT in July for strengthening to be able to walk with a cane instead of the rollator.     Limitations  Walking;Standing    Patient Stated Goals  I want to walk without a walker, improve endurance.    Currently in Pain?  No/denies    Pain Score  0-No pain    Pain Onset  More than a month ago         Ther-ex Nustep BUE/BLE level 3 x 5 min for warm-up   Quantum leg press BLE 130# x 15, 150# x 15 with cues to slow down LE movement for better strengthening;  Standing green tband hip abduction x10 bilaterally, repeated cues to avoid external rotation at hip for hip flexor substitution; Standing green tband hip extension x10 bilaterally; 1 /2 foam flat side up and balance with head turns left and right feet apart and feet together,with 50 % use of UE for support side stepping left and right in parallel bars 10 feet x 3, no UE support step ups from  floor to 6 inch stool x 20 bilateral marching in parallel bars x 20 Tilt board fwd/bwd, side to side left and right 50% UE support Stepping over hurdels fwd/ bwd, and side to side to side with BLE staggered stance anterior/posterior weight shifting on small rockerboard;  CGA, increased difficulty and more unsteadiness with RLE fwd; min cues to increase weight shift over RLE  Patient needs occasional verbal cueing to improve posture and cueing to correctly perform exercises slowly, holding at end of range to increase motor firing of desired muscle to encourage fatigue.                       PT Education - 03/20/18 1558    Education provided  Yes    Education Details  HEP    Person(s) Educated  Patient    Methods  Explanation    Comprehension  Verbalized understanding       PT Short Term Goals - 11/14/17 0848      PT SHORT TERM GOAL #1   Title  Pt will be able to perform sit <> stand transfer from a regular chair independently without UE assist to promote function.      Baseline  needs UE support    Time  4    Period  Weeks    Status  New    Target Date  12/11/17      PT SHORT TERM GOAL #2   Title  Patient will be able to stand at counter for 15 minutes to be able to complete a standing activity    Baseline  Patient has limited standing time with UE support and AD    Time  4    Period  Weeks    Status  New    Target Date  12/11/17        PT Long Term Goals - 03/20/18 1600      PT LONG TERM GOAL #1   Title  Patient will be independent in home exercise program to improve strength/mobility for better functional independence with ADLs    Baseline  stretching and strengthening    Time  8    Period  Weeks    Status  Partially Met    Target Date  04/25/18      PT LONG TERM GOAL #2   Title  Patient will ascend/descend 4 stairs with rail assist independently and ability to bring his rollator to the top or bottom of the steps , without loss of balance to improve ability to get in/out of home    Baseline  Patient is able to ascend and descend steps with railing and CGA    Time  8    Period  Weeks    Status  Partially Met    Target Date  04/25/18      PT LONG TERM GOAL #3   Title  Patient will increase 10 meter walk test to >1.63ms as to improve gait speed for better community ambulation and to reduce fall risk.    Baseline  1.31 m/sec ;01/15/18= 1.31 m/sec, 02/20/18 1.31 m/sec 03/20/18=1.31    Time  8    Period  Weeks    Status  Achieved    Target Date  04/25/18      PT LONG TERM GOAL #4   Title  Patient (> 742years old) will complete five times sit to stand test in < 15 seconds indicating an increased LE strength and improved balance.  Baseline  18.22 sec,  01/15/18=  18.22, 02/20/17 =18.22 sec 03/20/18 = 14.83 sec    Time  8    Period  Weeks    Status  Achieved    Target Date  04/25/18      PT LONG TERM GOAL #5   Title  Patient will ambulate 50 feet indoors with spc without falls or using the furniture for support.    Baseline  spc 75 % of  the time indoors    Time  8    Period  Weeks    Status  Achieved    Target Date  04/25/18            Plan - 03/20/18 1559    Clinical Impression Statement  Patient instructed in advanced LE strengthening and intermediate balance exercise. Patient required min VCS to improve weight shift and to increase terminal knee extension for better stance control.Patients goals were reviewed and progress was made and 2 goals were achieved.  Patient would benefit from additional skilled PT intervention to improve strength, balance and gait safety.    Rehab Potential  Good    Clinical Impairments Affecting Rehab Potential  chronicity of condition, Parkinson's disease, TKR BLE    PT Frequency  2x / week    PT Duration  8 weeks    PT Treatment/Interventions  Neuromuscular re-education;Manual techniques;Therapeutic exercise;Therapeutic activities;Aquatic Therapy;Gait training;Patient/family education;Cryotherapy;Stair training;Balance training;Dry needling;Taping    PT Next Visit Plan  Knee extension ROM, gait, functional strengthening, balance training    PT Home Exercise Plan  needs HEP next visit, 6 MW test, TUG test,     Consulted and Agree with Plan of Care  Patient       Patient will benefit from skilled therapeutic intervention in order to improve the following deficits and impairments:  Pain, Postural dysfunction, Improper body mechanics, Difficulty walking, Decreased strength, Decreased range of motion, Abnormal gait, Decreased balance, Decreased endurance, Decreased mobility, Impaired sensation, Decreased activity tolerance, Impaired flexibility  Visit Diagnosis: Muscle weakness (generalized)  Difficulty in walking, not elsewhere classified  Chronic pain of right knee     Problem List Patient Active Problem List   Diagnosis Date Noted  . Moderate recurrent major depression ([place]) 08/15/2016  . Chest pain due to myocardial ischemia 08/14/2016  . Chest pain, rule out acute myocardial  infarction 08/14/2016    Alanson Puls, [place] DPT 03/20/2018, 4:06 PM  [place] MAIN [facility] SERVICES [address] [place], [place], [address] Phone: [phone]   Fax:  [phone]  Name: Charles Choi MRN: [id number] Date of Birth: 1951/04/23

## 2018-03-26 ENCOUNTER — Ambulatory Visit: Payer: Medicare Other | Admitting: Physical Therapy

## 2018-03-26 ENCOUNTER — Encounter: Payer: Self-pay | Admitting: Physical Therapy

## 2018-03-26 DIAGNOSIS — R262 Difficulty in walking, not elsewhere classified: Secondary | ICD-10-CM

## 2018-03-26 DIAGNOSIS — M6281 Muscle weakness (generalized): Secondary | ICD-10-CM | POA: Diagnosis not present

## 2018-03-26 DIAGNOSIS — M25561 Pain in right knee: Secondary | ICD-10-CM

## 2018-03-26 DIAGNOSIS — G8929 Other chronic pain: Secondary | ICD-10-CM

## 2018-03-26 NOTE — Therapy (Signed)
[place] MAIN [facility] SERVICES [address] [place], [place], [address] Phone: [phone]   Fax:  [phone]  Physical Therapy Treatment  Patient Details  Name: Charles Choi MRN: [id number] Date of Birth: 1951-11-03 Referring Provider: Fulton Reek D    Encounter Date: 03/26/2018  PT End of Session - 03/26/18 1621    Visit Number  27    Number of Visits  33    Date for PT Re-Evaluation  04/25/18    PT Start Time  0300    PT Stop Time  0345    PT Time Calculation (min)  45 min    Equipment Utilized During Treatment  Gait belt    Activity Tolerance  Patient limited by fatigue    Behavior During Therapy  [facility] for tasks assessed/performed       Past Medical History:  Diagnosis Date  . Anxiety   . Cancer ([place])   . Depression   . Hypertension   . Parkinson's disease [facility])     Past Surgical History:  Procedure Laterality Date  . BACK SURGERY    . CHOLECYSTECTOMY    . DEEP BRAIN STIMULATOR PLACEMENT     for Parkinson's disease  . JOINT REPLACEMENT     left knee x2    There were no vitals filed for this visit.  Subjective Assessment - 03/26/18 1618    Subjective  Patient reports to PT that he is not doing his stretches and he is encouraged to start them again.     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  Had PT at [facility] for 7 days.  Used the bike 6-10 minutes, as well as worked on walking, ROM for his R knee.  Started walking with a rw and is currently using a rollator walker.  Pt also adds that he was in a wc since October 2017 until the surgery due to pain. Patient has been on antiobiotic now due to the left knee being infected. The MD will test him in april to see if he continues to have an infection and if he needs to have any more surgery on the left knee. Patients baseline for ambulation is that he was able to ambulate with a spc outdoors. He had PT in July for  strengthening to be able to walk with a cane instead of the rollator.     Limitations  Walking;Standing    Patient Stated Goals  I want to walk without a walker, improve endurance.    Currently in Pain?  Yes    Pain Score  2     Pain Location  Foot    Pain Orientation  Right    Pain Descriptors / Indicators  Aching    Pain Onset  More than a month ago       NEUROMUSCULAR RE-ED Stepping fwd x 5 , stepping bwd x 5 BLE , stepping fwd and then into bwd x 5 BLE Standing slow marching without UE support 2 x 10; Toe taps on BOSU x 10 bilateral, alternating; Modified tandem stance eyes open/closed x 30 seconds each alternating LE; Modified tandem stance eyes open with horizontal and vertical head turns alternating LE BOSU ball lunges x 20 x 2  Therapeutic exercises:   Standing exercises with 2# ankle weights: Marching 2 x 10; SLR 2 x 10; Abduction 2 x 10; Extension 2 x 10; Heel raises 2 x 10; Resisted  side-steeping RTB 4 lengths x 2; Interval SLR with 2 holds x 10  sidelying hip abd x 20  bridges x 20  Clam x 20    Patient continues to demonstrates less incoordination of movement with select exercises such as  stepping backwards. Patient responds well to verbal and tactile cues to correct form and technique. Patient is able to catch mistakes in technique with incorrect positions  Motor control of LE much improved.  Muscle fatigue but no major pain complaints.                 PT Education - 03/26/18 1621    Education provided  Yes    Education Details  HEP    Person(s) Educated  Patient    Methods  Explanation    Comprehension  Verbalized understanding       PT Short Term Goals - 11/14/17 0848      PT SHORT TERM GOAL #1   Title  Pt will be able to perform sit <> stand transfer from a regular chair independently without UE assist to promote function.     Baseline  needs UE support    Time  4    Period  Weeks    Status  New    Target Date  12/11/17      PT  SHORT TERM GOAL #2   Title  Patient will be able to stand at counter for 15 minutes to be able to complete a standing activity    Baseline  Patient has limited standing time with UE support and AD    Time  4    Period  Weeks    Status  New    Target Date  12/11/17        PT Long Term Goals - 03/20/18 1600      PT LONG TERM GOAL #1   Title  Patient will be independent in home exercise program to improve strength/mobility for better functional independence with ADLs    Baseline  stretching and strengthening    Time  8    Period  Weeks    Status  Partially Met    Target Date  04/25/18      PT LONG TERM GOAL #2   Title  Patient will ascend/descend 4 stairs with rail assist independently and ability to bring his rollator to the top or bottom of the steps , without loss of balance to improve ability to get in/out of home    Baseline  Patient is able to ascend and descend steps with railing and CGA    Time  8    Period  Weeks    Status  Partially Met    Target Date  04/25/18      PT LONG TERM GOAL #3   Title  Patient will increase 10 meter walk test to >1.98ms as to improve gait speed for better community ambulation and to reduce fall risk.    Baseline  1.31 m/sec ;01/15/18= 1.31 m/sec, 02/20/18 1.31 m/sec 03/20/18=1.31    Time  8    Period  Weeks    Status  Achieved    Target Date  04/25/18      PT LONG TERM GOAL #4   Title  Patient (> 721years old) will complete five times sit to stand test in < 15 seconds indicating an increased LE strength and improved balance.    Baseline  18.22 sec,  01/15/18=  18.22, 02/20/17 =18.22 sec 03/20/18 = 14.83  sec    Time  8    Period  Weeks    Status  Achieved    Target Date  04/25/18      PT LONG TERM GOAL #5   Title  Patient will ambulate 50 feet indoors with spc without falls or using the furniture for support.    Baseline  spc 75 % of the time indoors    Time  8    Period  Weeks    Status  Achieved    Target Date  04/25/18               Patient will benefit from skilled therapeutic intervention in order to improve the following deficits and impairments:     Visit Diagnosis: Muscle weakness (generalized)  Difficulty in walking, not elsewhere classified  Chronic pain of right knee     Problem List Patient Active Problem List   Diagnosis Date Noted  . Moderate recurrent major depression ([place]) 08/15/2016  . Chest pain due to myocardial ischemia 08/14/2016  . Chest pain, rule out acute myocardial infarction 08/14/2016    Alanson Puls, [place] DPT 03/26/2018, 4:24 PM  [place] MAIN [facility] SERVICES [address] [place], [place], [address] Phone: [phone]   Fax:  [phone]  Name: AVROHOM MCKELVIN MRN: [id number] Date of Birth: 06/04/1951

## 2018-03-27 DIAGNOSIS — L84 Corns and callosities: Secondary | ICD-10-CM | POA: Insufficient documentation

## 2018-03-28 ENCOUNTER — Ambulatory Visit: Payer: Medicare Other | Admitting: Physical Therapy

## 2018-03-28 ENCOUNTER — Encounter: Payer: Self-pay | Admitting: Physical Therapy

## 2018-03-28 DIAGNOSIS — M25561 Pain in right knee: Secondary | ICD-10-CM

## 2018-03-28 DIAGNOSIS — R262 Difficulty in walking, not elsewhere classified: Secondary | ICD-10-CM

## 2018-03-28 DIAGNOSIS — M6281 Muscle weakness (generalized): Secondary | ICD-10-CM | POA: Diagnosis not present

## 2018-03-28 DIAGNOSIS — G8929 Other chronic pain: Secondary | ICD-10-CM

## 2018-03-28 NOTE — Therapy (Signed)
[place] MAIN [facility] SERVICES [address] [place], [place], [address] Phone: [phone]   Fax:  [phone]  Physical Therapy Treatment  Patient Details  Name: Charles Choi MRN: [id number] Date of Birth: 11-19-1950 Referring Provider: Fulton Reek D    Encounter Date: 03/28/2018  PT End of Session - 03/28/18 1517    Visit Number  28    Number of Visits  33    Date for PT Re-Evaluation  04/25/18    PT Start Time  0305    PT Stop Time  0345    PT Time Calculation (min)  40 min    Equipment Utilized During Treatment  Gait belt    Activity Tolerance  Patient limited by fatigue    Behavior During Therapy  [facility] for tasks assessed/performed       Past Medical History:  Diagnosis Date  . Anxiety   . Cancer ([place])   . Depression   . Hypertension   . Parkinson's disease [facility])     Past Surgical History:  Procedure Laterality Date  . BACK SURGERY    . CHOLECYSTECTOMY    . DEEP BRAIN STIMULATOR PLACEMENT     for Parkinson's disease  . JOINT REPLACEMENT     left knee x2    There were no vitals filed for this visit.  Subjective Assessment - 03/28/18 1513    Subjective  Patient reports to PT that he is trying to do his stretches.     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  Had PT at [facility] for 7 days.  Used the bike 6-10 minutes, as well as worked on walking, ROM for his R knee.  Started walking with a rw and is currently using a rollator walker.  Pt also adds that he was in a wc since October 2017 until the surgery due to pain. Patient has been on antiobiotic now due to the left knee being infected. The MD will test him in april to see if he continues to have an infection and if he needs to have any more surgery on the left knee. Patients baseline for ambulation is that he was able to ambulate with a spc outdoors. He had PT in July for strengthening to be able to walk with a  cane instead of the rollator.     Limitations  Walking;Standing    Patient Stated Goals  I want to walk without a walker, improve endurance.    Currently in Pain?  Yes    Pain Score  2     Pain Location  Foot    Pain Orientation  Right    Pain Descriptors / Indicators  Aching    Pain Type  Chronic pain    Pain Onset  More than a month ago    Aggravating Factors   walking    Pain Relieving Factors  rest    Multiple Pain Sites  -- left shoulder 1/10      Treatment: Nustep  L5 x5 minutes with SPM above 85  Fwd/ bwd stepping single leg  B LE standing hip SLR, abd, ext 2x10 with GTB LAQs with GTB 2x10 Mini squats 2x10, cues for posture Heel raises 2x10 Pro stretch x 30 sec x 3  Baps board DF/PF x 10 x 2 , needs UE support BOSU lunges x 10 BLE, cues to keep shouders upright and not tp use UE for support  Toe taps from foam to 6 inch stool x 20  1/2 foam flat side up and balance with head turns left and right feet apart and feet together,; cues for keeping balance and using ankle and hips to maintain center of gravity  tandem standing on 1/2 foam  ; cues to maintain balance and posture correction standing hip abd with YTB x 20  ; cues for correct position and technique side stepping left and right in parallel bars 10 feet x 3; rotation cues for upright posture and not rotating hips towards the direction of motion step ups from floor to 6 inch stool x 20 bilateral; cues to try not to use UE and to keep correct head position leg press 100 lbs x 20 x 3, cues not to snap knees during extension and to perform slowly for max strengthening  marching in parallel bars x 20; cues to raise up knees level with therapists hands Tilt board fwd/bwd, side to side left and right; cues for posture correction x 20     Pt showed decreased stability and fatigue with balance tasks. Pt needed mod cueing with CGA while performing stability tasks. Decreased balance with head turns was demonstrated requiring UE  assist after each turn. Patient needs UE support for side stepping over balance stones. Patient has poor static and dynamic standing balance and needs frequent use of hands to steady and keep from losing balance.                      PT Education - 03/28/18 1517    Education provided  Yes    Education Details  HEP technique    Person(s) Educated  Patient    Methods  Explanation    Comprehension  Verbalized understanding       PT Short Term Goals - 11/14/17 0848      PT SHORT TERM GOAL #1   Title  Pt will be able to perform sit <> stand transfer from a regular chair independently without UE assist to promote function.     Baseline  needs UE support    Time  4    Period  Weeks    Status  New    Target Date  12/11/17      PT SHORT TERM GOAL #2   Title  Patient will be able to stand at counter for 15 minutes to be able to complete a standing activity    Baseline  Patient has limited standing time with UE support and AD    Time  4    Period  Weeks    Status  New    Target Date  12/11/17        PT Long Term Goals - 03/20/18 1600      PT LONG TERM GOAL #1   Title  Patient will be independent in home exercise program to improve strength/mobility for better functional independence with ADLs    Baseline  stretching and strengthening    Time  8    Period  Weeks    Status  Partially Met    Target Date  04/25/18      PT LONG TERM GOAL #2   Title  Patient will ascend/descend 4 stairs with rail assist independently and ability to bring his rollator to the top or bottom of the steps , without loss of balance to improve ability to get in/out of home    Baseline  Patient is able to ascend and  descend steps with railing and CGA    Time  8    Period  Weeks    Status  Partially Met    Target Date  04/25/18      PT LONG TERM GOAL #3   Title  Patient will increase 10 meter walk test to >1.24ms as to improve gait speed for better community ambulation and to reduce fall  risk.    Baseline  1.31 m/sec ;01/15/18= 1.31 m/sec, 02/20/18 1.31 m/sec 03/20/18=1.31    Time  8    Period  Weeks    Status  Achieved    Target Date  04/25/18      PT LONG TERM GOAL #4   Title  Patient (> 659years old) will complete five times sit to stand test in < 15 seconds indicating an increased LE strength and improved balance.    Baseline  18.22 sec,  01/15/18=  18.22, 02/20/17 =18.22 sec 03/20/18 = 14.83 sec    Time  8    Period  Weeks    Status  Achieved    Target Date  04/25/18      PT LONG TERM GOAL #5   Title  Patient will ambulate 50 feet indoors with spc without falls or using the furniture for support.    Baseline  spc 75 % of the time indoors    Time  8    Period  Weeks    Status  Achieved    Target Date  04/25/18            Plan - 03/28/18 1517    Clinical Impression Statement  Dynamic and static balance interventions continued today, with a focus on unilateral LE stability.  Pt tolerated all exercises well.  Intermediate dynamic standing balance tasks were progressed with cga needed for all dynamic standing balance tasks.  LE strength exercises were progressed today with focus on unilateral strength and stability to focus on increasing LE strength and stability with daily tasks.  Pt would continue to benefit from skilled therapy services in order to further address LE strength deficits and balance deficits in order to decrease fall risk and improve mobility.    Rehab Potential  Good    Clinical Impairments Affecting Rehab Potential  chronicity of condition, Parkinson's disease, TKR BLE    PT Frequency  2x / week    PT Duration  8 weeks    PT Treatment/Interventions  Neuromuscular re-education;Manual techniques;Therapeutic exercise;Therapeutic activities;Aquatic Therapy;Gait training;Patient/family education;Cryotherapy;Stair training;Balance training;Dry needling;Taping    PT Next Visit Plan  Knee extension ROM, gait, functional strengthening, balance training    PT  Home Exercise Plan  needs HEP next visit, 6 MW test, TUG test,     Consulted and Agree with Plan of Care  Patient       Patient will benefit from skilled therapeutic intervention in order to improve the following deficits and impairments:  Pain, Postural dysfunction, Improper body mechanics, Difficulty walking, Decreased strength, Decreased range of motion, Abnormal gait, Decreased balance, Decreased endurance, Decreased mobility, Impaired sensation, Decreased activity tolerance, Impaired flexibility  Visit Diagnosis: Muscle weakness (generalized)  Difficulty in walking, not elsewhere classified  Chronic pain of right knee     Problem List Patient Active Problem List   Diagnosis Date Noted  . Moderate recurrent major depression ([place] 08/15/2016  . Chest pain due to myocardial ischemia 08/14/2016  . Chest pain, rule out acute myocardial infarction 08/14/2016    MAlanson Puls PT DPT 03/28/2018, 3:20 PM  [place] MAIN [facility] SERVICES [address] [place], [place], [address] Phone: [phone]   Fax:  [phone]  Name: Charles Choi MRN: [id number] Date of Birth: 1951-07-01

## 2018-04-03 ENCOUNTER — Encounter: Payer: Self-pay | Admitting: Physical Therapy

## 2018-04-03 ENCOUNTER — Ambulatory Visit: Payer: Medicare Other | Admitting: Physical Therapy

## 2018-04-03 DIAGNOSIS — R262 Difficulty in walking, not elsewhere classified: Secondary | ICD-10-CM

## 2018-04-03 DIAGNOSIS — M6281 Muscle weakness (generalized): Secondary | ICD-10-CM

## 2018-04-03 DIAGNOSIS — M25561 Pain in right knee: Secondary | ICD-10-CM

## 2018-04-03 DIAGNOSIS — G8929 Other chronic pain: Secondary | ICD-10-CM

## 2018-04-03 NOTE — Therapy (Signed)
[place] MAIN [facility] SERVICES [address] [place], [place], [address] Phone: [phone]   Fax:  [phone]  Physical Therapy Treatment  Patient Details  Name: Charles Choi MRN: [id number] Date of Birth: 1950-12-01 Referring Provider: Fulton Reek D    Encounter Date: 04/03/2018  PT End of Session - 04/03/18 1609    Visit Number  29    Number of Visits  33    Date for PT Re-Evaluation  04/25/18    PT Start Time  0345    PT Stop Time  0430    PT Time Calculation (min)  45 min    Equipment Utilized During Treatment  Gait belt    Activity Tolerance  Patient limited by fatigue    Behavior During Therapy  [facility] for tasks assessed/performed       Past Medical History:  Diagnosis Date  . Anxiety   . Cancer ([place])   . Depression   . Hypertension   . Parkinson's disease [facility])     Past Surgical History:  Procedure Laterality Date  . BACK SURGERY    . CHOLECYSTECTOMY    . DEEP BRAIN STIMULATOR PLACEMENT     for Parkinson's disease  . JOINT REPLACEMENT     left knee x2    There were no vitals filed for this visit.  Subjective Assessment - 04/03/18 1601    Subjective  Patient reports to PT that he is trying to do his stretches.     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  Had PT at [facility] for 7 days.  Used the bike 6-10 minutes, as well as worked on walking, ROM for his R knee.  Started walking with a rw and is currently using a rollator walker.  Pt also adds that he was in a wc since October 2017 until the surgery due to pain. Patient has been on antiobiotic now due to the left knee being infected. The MD will test him in april to see if he continues to have an infection and if he needs to have any more surgery on the left knee. Patients baseline for ambulation is that he was able to ambulate with a spc outdoors. He had PT in July for strengthening to be able to walk with a  cane instead of the rollator.     Limitations  Walking;Standing    Patient Stated Goals  I want to walk without a walker, improve endurance.    Currently in Pain?  Yes    Pain Score  1     Pain Location  Ankle    Pain Orientation  Right    Pain Descriptors / Indicators  Aching    Pain Onset  More than a month ago         Therapeutic exercise:  Nu-step x 5 mins  Step ups to 6 inch stool up and then over the other side of the stool and then back step back to stool and repeat x 10   Disk side by side and Fwd/bwd tandem x 10  Fwd step bwd step and fwd/bwd together x 10 BLE  Roicker board fwd/ bwd, side to side x 20 taps each way and CGA, VC for posture  Standing with red therball under knee , pushing to wall 10 sec on , 5 sec off x 10 BLE  Leg press 130 lbs x 20 x 3 , vC for  not snapping knee  TM walking x 10 mins , heel toe gait and knee squeeze x ten mins . 7 miles / hour, VC for posture   CGA for all activities and min vc for correct technique                       PT Education - 04/03/18 1608    Education provided  Yes    Education Details  HEP    Person(s) Educated  Patient    Methods  Explanation    Comprehension  Verbalized understanding       PT Short Term Goals - 11/14/17 0848      PT SHORT TERM GOAL #1   Title  Pt will be able to perform sit <> stand transfer from a regular chair independently without UE assist to promote function.     Baseline  needs UE support    Time  4    Period  Weeks    Status  New    Target Date  12/11/17      PT SHORT TERM GOAL #2   Title  Patient will be able to stand at counter for 15 minutes to be able to complete a standing activity    Baseline  Patient has limited standing time with UE support and AD    Time  4    Period  Weeks    Status  New    Target Date  12/11/17        PT Long Term Goals - 03/20/18 1600      PT LONG TERM GOAL #1   Title  Patient will be independent in home exercise  program to improve strength/mobility for better functional independence with ADLs    Baseline  stretching and strengthening    Time  8    Period  Weeks    Status  Partially Met    Target Date  04/25/18      PT LONG TERM GOAL #2   Title  Patient will ascend/descend 4 stairs with rail assist independently and ability to bring his rollator to the top or bottom of the steps , without loss of balance to improve ability to get in/out of home    Baseline  Patient is able to ascend and descend steps with railing and CGA    Time  8    Period  Weeks    Status  Partially Met    Target Date  04/25/18      PT LONG TERM GOAL #3   Title  Patient will increase 10 meter walk test to >1.73ms as to improve gait speed for better community ambulation and to reduce fall risk.    Baseline  1.31 m/sec ;01/15/18= 1.31 m/sec, 02/20/18 1.31 m/sec 03/20/18=1.31    Time  8    Period  Weeks    Status  Achieved    Target Date  04/25/18      PT LONG TERM GOAL #4   Title  Patient (> 787years old) will complete five times sit to stand test in < 15 seconds indicating an increased LE strength and improved balance.    Baseline  18.22 sec,  01/15/18=  18.22, 02/20/17 =18.22 sec 03/20/18 = 14.83 sec    Time  8    Period  Weeks    Status  Achieved    Target Date  04/25/18      PT LONG TERM GOAL #5   Title  Patient will ambulate 50 feet indoors with spc without falls or using the furniture for support.    Baseline  spc 75 % of the time indoors    Time  8    Period  Weeks    Status  Achieved    Target Date  04/25/18            Plan - 04/03/18 1610    Clinical Impression Statement  Pt was able to progress dynamic balance exercises today, noting improved postural reactions with LOB and moving outside normal BOS.  Pt was able to perform all exercises on uneven surfaces with minimal LOB noted.  Dynamic balance stability activities were progressed today, with decrease control noted when attempting to perform tasks with  the R > L.  Pt would continue to benefit from skilled therapy services to address further balance impairments and decrease falls risk.    Rehab Potential  Good    Clinical Impairments Affecting Rehab Potential  chronicity of condition, Parkinson's disease, TKR BLE    PT Frequency  2x / week    PT Duration  8 weeks    PT Treatment/Interventions  Neuromuscular re-education;Manual techniques;Therapeutic exercise;Therapeutic activities;Aquatic Therapy;Gait training;Patient/family education;Cryotherapy;Stair training;Balance training;Dry needling;Taping    PT Next Visit Plan  Knee extension ROM, gait, functional strengthening, balance training    PT Home Exercise Plan  needs HEP next visit, 6 MW test, TUG test,     Consulted and Agree with Plan of Care  Patient       Patient will benefit from skilled therapeutic intervention in order to improve the following deficits and impairments:  Pain, Postural dysfunction, Improper body mechanics, Difficulty walking, Decreased strength, Decreased range of motion, Abnormal gait, Decreased balance, Decreased endurance, Decreased mobility, Impaired sensation, Decreased activity tolerance, Impaired flexibility  Visit Diagnosis: Muscle weakness (generalized)  Difficulty in walking, not elsewhere classified  Chronic pain of right knee     Problem List Patient Active Problem List   Diagnosis Date Noted  . Moderate recurrent major depression ([place]) 08/15/2016  . Chest pain due to myocardial ischemia 08/14/2016  . Chest pain, rule out acute myocardial infarction 08/14/2016    Alanson Puls, [place] DPT 04/03/2018, 4:19 PM  [place] MAIN [facility] SERVICES [place], [place], [address] Phone: [phone]   Fax:  [phone]  Name: Charles Choi MRN: [id number] Date of Birth: 1951/06/19

## 2018-04-05 ENCOUNTER — Ambulatory Visit: Payer: Medicare Other | Admitting: Physical Therapy

## 2018-04-10 ENCOUNTER — Ambulatory Visit: Payer: Medicare Other | Admitting: Physical Therapy

## 2018-04-10 ENCOUNTER — Encounter: Payer: Self-pay | Admitting: Physical Therapy

## 2018-04-10 ENCOUNTER — Ambulatory Visit: Payer: Medicare Other | Attending: Internal Medicine | Admitting: Physical Therapy

## 2018-04-10 DIAGNOSIS — M6281 Muscle weakness (generalized): Secondary | ICD-10-CM | POA: Insufficient documentation

## 2018-04-10 DIAGNOSIS — R262 Difficulty in walking, not elsewhere classified: Secondary | ICD-10-CM | POA: Diagnosis present

## 2018-04-10 DIAGNOSIS — G8929 Other chronic pain: Secondary | ICD-10-CM | POA: Insufficient documentation

## 2018-04-10 DIAGNOSIS — M25561 Pain in right knee: Secondary | ICD-10-CM | POA: Insufficient documentation

## 2018-04-10 NOTE — Therapy (Signed)
[place] MAIN [facility] SERVICES [address] [place], [place], [address] Phone: [phone]   Fax:  [phone]  Physical Therapy Treatment Physical Therapy Progress Note   Dates of reporting period 02/07/18   to   04/10/18  Patient Details  Name: Charles Choi MRN: [id number] Date of Birth: 09-15-1951 Referring Provider: Fulton Reek D    Encounter Date: 04/10/2018  PT End of Session - 04/10/18 1549    Visit Number  30    Number of Visits  33    Date for PT Re-Evaluation  04/25/18    PT Start Time  0315    PT Stop Time  0400    PT Time Calculation (min)  45 min    Equipment Utilized During Treatment  Gait belt    Activity Tolerance  Patient limited by fatigue    Behavior During Therapy  WFL for tasks assessed/performed       Past Medical History:  Diagnosis Date  . Anxiety   . Cancer ([place])   . Depression   . Hypertension   . Parkinson's disease [facility])     Past Surgical History:  Procedure Laterality Date  . BACK SURGERY    . CHOLECYSTECTOMY    . DEEP BRAIN STIMULATOR PLACEMENT     for Parkinson's disease  . JOINT REPLACEMENT     left knee x2    There were no vitals filed for this visit.  Subjective Assessment - 04/10/18 1547    Subjective  Patient reports to PT that he is doing his stretches. He has a callous on his right toe second toe and it is burning.     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  Had PT at [facility] for 7 days.  Used the bike 6-10 minutes, as well as worked on walking, ROM for his R knee.  Started walking with a rw and is currently using a rollator walker.  Pt also adds that he was in a wc since October 2017 until the surgery due to pain. Patient has been on antiobiotic now due to the left knee being infected. The MD will test him in april to see if he continues to have an infection and if he needs to have any more surgery on the left knee. Patients  baseline for ambulation is that he was able to ambulate with a spc outdoors. He had PT in July for strengthening to be able to walk with a cane instead of the rollator.     Limitations  Walking;Standing    Patient Stated Goals  I want to walk without a walker, improve endurance.    Currently in Pain?  Yes    Pain Score  3     Pain Location  Toe (Comment which one)    Pain Orientation  Right    Pain Descriptors / Indicators  Burning    Pain Type  Acute pain    Pain Onset  More than a month ago    Aggravating Factors   exercise, walking    Pain Relieving Factors  nothing    Multiple Pain Sites  --  left shoulder 1/10       Therapeutic exercise: Supine hip abd/add x 15 x 2 BLE; cues to keep LE straight and perform exercise slowly Supine SLR x 10 x 2 BLE; cues to tighten quad muscle and perform exercise to 60 deg and stop hooklying hip  abd/ER x 20 x 2; cues to tighten TA and perform slowly  Bridging x 10 x 2; cues to raise up high enough and descend slowly Trunk rotation left and right x 20; cues to tighten TA and perform slowly   1/2 foam flat side up and balance with head turns left and right feet apart and feet together,; cues for keeping balance and using ankle and hips to maintain center of gravit  standing on 1/2 foam  ; cues to maintain balance and posture correction standing hip abd with YTB x 20  ; cues for correct position and technique side stepping left and right in parallel bars 10 feet x 3; rotation cues for upright posture and not rotating hips towards the direction of motion step ups from floor to 6 inch stool x 20 bilateral; cues to try not to use UE and to keep correct head position l Patient needs occasional verbal cueing to improve posture and cueing to correctly perform exercises slowly, holding at end of range to increase motor firing of desired muscle to encourage fatigue.                        PT Education - 04/10/18 1549    Education  provided  Yes    Education Details  HEP    Person(s) Educated  Patient    Methods  Explanation    Comprehension  Verbalized understanding       PT Short Term Goals - 11/14/17 0848      PT SHORT TERM GOAL #1   Title  Pt will be able to perform sit <> stand transfer from a regular chair independently without UE assist to promote function.     Baseline  needs UE support    Time  4    Period  Weeks    Status  New    Target Date  12/11/17      PT SHORT TERM GOAL #2   Title  Patient will be able to stand at counter for 15 minutes to be able to complete a standing activity    Baseline  Patient has limited standing time with UE support and AD    Time  4    Period  Weeks    Status  New    Target Date  12/11/17        PT Long Term Goals - 03/20/18 1600      PT LONG TERM GOAL #1   Title  Patient will be independent in home exercise program to improve strength/mobility for better functional independence with ADLs    Baseline  stretching and strengthening    Time  8    Period  Weeks    Status  Partially Met    Target Date  04/25/18      PT LONG TERM GOAL #2   Title  Patient will ascend/descend 4 stairs with rail assist independently and ability to bring his rollator to the top or bottom of the steps , without loss of balance to improve ability to get in/out of home    Baseline  Patient is able to ascend and descend steps with railing and CGA    Time  8    Period  Weeks    Status  Partially Met    Target Date  04/25/18      PT LONG TERM GOAL #3   Title  Patient will increase 10 meter walk test to >1.47ms as to  improve gait speed for better community ambulation and to reduce fall risk.    Baseline  1.31 m/sec ;01/15/18= 1.31 m/sec, 02/20/18 1.31 m/sec 03/20/18=1.31    Time  8    Period  Weeks    Status  Achieved    Target Date  04/25/18      PT LONG TERM GOAL #4   Title  Patient (> 9 years old) will complete five times sit to stand test in < 15 seconds indicating an increased  LE strength and improved balance.    Baseline  18.22 sec,  01/15/18=  18.22, 02/20/17 =18.22 sec 03/20/18 = 14.83 sec    Time  8    Period  Weeks    Status  Achieved    Target Date  04/25/18      PT LONG TERM GOAL #5   Title  Patient will ambulate 50 feet indoors with spc without falls or using the furniture for support.    Baseline  spc 75 % of the time indoors    Time  8    Period  Weeks    Status  Achieved    Target Date  04/25/18            Plan - 04/10/18 1550    Clinical Impression Statement Patient's condition has the potential to improve in response to therapy. Maximum improvement is yet to be obtained. The anticipated improvement is attainable and reasonable in a generally predictable time. Start date of reporting period  02/07/18 end date of reporting period  04/10/18. Patient reports that balance is getting better and he is walking without the Rolator in the house and outside longer distances.  Patient required min verbal cueing during strengthening exercise to correct posture and form. Patient demonstrates decreased gait speed with deviations and requires verbal and tactile cueing to maintain center of gravity during ambulation.  Patient demonstrates ability to perform strengthening exercises with minimal pain but with moderate fatigue. Patient will continue to benefit from continued skilled therapy in order to improve dynamic standing balance and increase strength in order to improve gait and mobility.    Rehab Potential  Good    Clinical Impairments Affecting Rehab Potential  chronicity of condition, Parkinson's disease, TKR BLE    PT Frequency  2x / week    PT Duration  8 weeks    PT Treatment/Interventions  Neuromuscular re-education;Manual techniques;Therapeutic exercise;Therapeutic activities;Aquatic Therapy;Gait training;Patient/family education;Cryotherapy;Stair training;Balance training;Dry needling;Taping    PT Next Visit Plan  Knee extension ROM, gait, functional  strengthening, balance training    PT Home Exercise Plan  needs HEP next visit, 6 MW test, TUG test,     Consulted and Agree with Plan of Care  Patient       Patient will benefit from skilled therapeutic intervention in order to improve the following deficits and impairments:  Pain, Postural dysfunction, Improper body mechanics, Difficulty walking, Decreased strength, Decreased range of motion, Abnormal gait, Decreased balance, Decreased endurance, Decreased mobility, Impaired sensation, Decreased activity tolerance, Impaired flexibility  Visit Diagnosis: Muscle weakness (generalized)  Difficulty in walking, not elsewhere classified  Chronic pain of right knee     Problem List Patient Active Problem List   Diagnosis Date Noted  . Moderate recurrent major depression ([place]) 08/15/2016  . Chest pain due to myocardial ischemia 08/14/2016  . Chest pain, rule out acute myocardial infarction 08/14/2016    Alanson Puls, PT DPT 04/10/2018, 3:53 PM  [place] MAIN [facility] SERVICES  [place], [place], [address] Phone: [phone]   Fax:  [phone]  Name: SCHUYLER OLDEN MRN: [id number] Date of Birth: 1951-08-16

## 2018-04-12 ENCOUNTER — Ambulatory Visit: Payer: Medicare Other | Admitting: Physical Therapy

## 2018-04-17 ENCOUNTER — Ambulatory Visit: Payer: Medicare Other | Admitting: Physical Therapy

## 2018-04-17 ENCOUNTER — Encounter: Payer: Self-pay | Admitting: Physical Therapy

## 2018-04-17 DIAGNOSIS — G8929 Other chronic pain: Secondary | ICD-10-CM

## 2018-04-17 DIAGNOSIS — M6281 Muscle weakness (generalized): Secondary | ICD-10-CM

## 2018-04-17 DIAGNOSIS — R262 Difficulty in walking, not elsewhere classified: Secondary | ICD-10-CM

## 2018-04-17 DIAGNOSIS — M25561 Pain in right knee: Secondary | ICD-10-CM

## 2018-04-17 NOTE — Therapy (Signed)
[place] MAIN [facility] SERVICES [address] [place], [place], [address] Phone: [phone]   Fax:  [phone]  Physical Therapy Treatment  Patient Details  Name: Charles Choi MRN: [id number] Date of Birth: 07/30/1951 Referring Provider: Fulton Reek D    Encounter Date: 04/17/2018  PT End of Session - 04/17/18 1527    Visit Number  31    Number of Visits  33    Date for PT Re-Evaluation  04/25/18    PT Start Time  0312    PT Stop Time  0352    PT Time Calculation (min)  40 min    Equipment Utilized During Treatment  Gait belt    Activity Tolerance  Patient limited by fatigue    Behavior During Therapy  [facility] for tasks assessed/performed       Past Medical History:  Diagnosis Date  . Anxiety   . Cancer ([place])   . Depression   . Hypertension   . Parkinson's disease [facility])     Past Surgical History:  Procedure Laterality Date  . BACK SURGERY    . CHOLECYSTECTOMY    . DEEP BRAIN STIMULATOR PLACEMENT     for Parkinson's disease  . JOINT REPLACEMENT     left knee x2    There were no vitals filed for this visit.  Subjective Assessment - 04/17/18 1523    Subjective  Patient reports to PT that he is doing his stretches. He had a callous on his right toe second toe that he pulled off last night,  and it is burning.     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  Had PT at [facility] for 7 days.  Used the bike 6-10 minutes, as well as worked on walking, ROM for his R knee.  Started walking with a rw and is currently using a rollator walker.  Pt also adds that he was in a wc since October 2017 until the surgery due to pain. Patient has been on antiobiotic now due to the left knee being infected. The MD will test him in april to see if he continues to have an infection and if he needs to have any more surgery on the left knee. Patients baseline for ambulation is that he was able to  ambulate with a spc outdoors. He had PT in July for strengthening to be able to walk with a cane instead of the rollator.     Limitations  Walking;Standing    Patient Stated Goals  I want to walk without a walker, improve endurance.    Currently in Pain?  Yes    Pain Score  3     Pain Location  Toe (Comment which one)    Pain Orientation  Right    Pain Descriptors / Indicators  Burning    Pain Onset  More than a month ago    Aggravating Factors   standing    Pain Relieving Factors  sitting    Multiple Pain Sites  -- right foot 1/10, left shoulder 1/10       Treatment: Bridges with 5 sec hold x 15 Single leg bridges with 5 sec hold x 15  Left leg and right leg SLR BLE x 15  Hooklying marching with TA x 40 with TA hooklying hip abd/ER with GTB x 20 with TA sidelying hip abd BLE x 15  SAQ with 3 lbs x 15  with 5 sec hold LAQ with 3 lbs x 15 with 5 sec hold Right sided plank x 30 sec x hold and patient had a bad cramp  Patient is able to perform all exercises with correction for technique without increasing pain to right toe, left shoulder or right foot. He is fatigued but continues to give 100%. He lacks 15 deg of left knee extension and was reminded to keep up his hamstring and calf stretches.                           PT Education - 04/17/18 1527    Education provided  Yes    Education Details  HEP    Person(s) Educated  Patient    Methods  Explanation    Comprehension  Verbalized understanding       PT Short Term Goals - 11/14/17 0848      PT SHORT TERM GOAL #1   Title  Pt will be able to perform sit <> stand transfer from a regular chair independently without UE assist to promote function.     Baseline  needs UE support    Time  4    Period  Weeks    Status  New    Target Date  12/11/17      PT SHORT TERM GOAL #2   Title  Patient will be able to stand at counter for 15 minutes to be able to complete a standing activity    Baseline  Patient has  limited standing time with UE support and AD    Time  4    Period  Weeks    Status  New    Target Date  12/11/17        PT Long Term Goals - 03/20/18 1600      PT LONG TERM GOAL #1   Title  Patient will be independent in home exercise program to improve strength/mobility for better functional independence with ADLs    Baseline  stretching and strengthening    Time  8    Period  Weeks    Status  Partially Met    Target Date  04/25/18      PT LONG TERM GOAL #2   Title  Patient will ascend/descend 4 stairs with rail assist independently and ability to bring his rollator to the top or bottom of the steps , without loss of balance to improve ability to get in/out of home    Baseline  Patient is able to ascend and descend steps with railing and CGA    Time  8    Period  Weeks    Status  Partially Met    Target Date  04/25/18      PT LONG TERM GOAL #3   Title  Patient will increase 10 meter walk test to >1.78ms as to improve gait speed for better community ambulation and to reduce fall risk.    Baseline  1.31 m/sec ;01/15/18= 1.31 m/sec, 02/20/18 1.31 m/sec 03/20/18=1.31    Time  8    Period  Weeks    Status  Achieved    Target Date  04/25/18      PT LONG TERM GOAL #4   Title  Patient (> 635years old) will complete five times sit to stand test in < 15 seconds indicating an increased LE strength and improved balance.    Baseline  18.22 sec,  01/15/18=  18.22, 02/20/17 =18.22 sec  03/20/18 = 14.83 sec    Time  8    Period  Weeks    Status  Achieved    Target Date  04/25/18      PT LONG TERM GOAL #5   Title  Patient will ambulate 50 feet indoors with spc without falls or using the furniture for support.    Baseline  spc 75 % of the time indoors    Time  8    Period  Weeks    Status  Achieved    Target Date  04/25/18            Plan - 04/17/18 1528    Clinical Impression Statement  Patient instructed in advanced strengthening exercises due to difficulty with gait today and  poor motor control. Patient fatigues quickly requiring short rest breaks in between advanced exercise. Patient instructed in advanced strengthening with increased repetition/resistance.Patient is having a right foot orthotic made to help support his flat foot and right foot pain. He is also going to schedule L shoulder surgery.  Patient would benefit from additional skilled PT intervention to improve gait safety and reduce fall risk.    Rehab Potential  Good    Clinical Impairments Affecting Rehab Potential  chronicity of condition, Parkinson's disease, TKR BLE    PT Frequency  2x / week    PT Duration  8 weeks    PT Treatment/Interventions  Neuromuscular re-education;Manual techniques;Therapeutic exercise;Therapeutic activities;Aquatic Therapy;Gait training;Patient/family education;Cryotherapy;Stair training;Balance training;Dry needling;Taping    PT Next Visit Plan  Knee extension ROM, gait, functional strengthening, balance training    PT Home Exercise Plan  needs HEP next visit, 6 MW test, TUG test,     Consulted and Agree with Plan of Care  Patient       Patient will benefit from skilled therapeutic intervention in order to improve the following deficits and impairments:  Pain, Postural dysfunction, Improper body mechanics, Difficulty walking, Decreased strength, Decreased range of motion, Abnormal gait, Decreased balance, Decreased endurance, Decreased mobility, Impaired sensation, Decreased activity tolerance, Impaired flexibility  Visit Diagnosis: Muscle weakness (generalized)  Difficulty in walking, not elsewhere classified  Chronic pain of right knee     Problem List Patient Active Problem List   Diagnosis Date Noted  . Moderate recurrent major depression ([place]) 08/15/2016  . Chest pain due to myocardial ischemia 08/14/2016  . Chest pain, rule out acute myocardial infarction 08/14/2016    Alanson Puls, [place] DPT 04/17/2018, 3:31 PM  [place] MAIN [facility] SERVICES [address] [place], [place], [address] Phone: [phone]   Fax:  [phone]  Name: Charles Choi MRN: [id number] Date of Birth: [birth year]

## 2018-04-19 ENCOUNTER — Ambulatory Visit: Payer: Medicare Other | Admitting: Physical Therapy

## 2018-04-19 ENCOUNTER — Encounter: Payer: Self-pay | Admitting: Physical Therapy

## 2018-04-19 DIAGNOSIS — M25561 Pain in right knee: Secondary | ICD-10-CM

## 2018-04-19 DIAGNOSIS — R262 Difficulty in walking, not elsewhere classified: Secondary | ICD-10-CM

## 2018-04-19 DIAGNOSIS — G8929 Other chronic pain: Secondary | ICD-10-CM

## 2018-04-19 DIAGNOSIS — M6281 Muscle weakness (generalized): Secondary | ICD-10-CM

## 2018-04-19 NOTE — Therapy (Signed)
[place] MAIN [facility] SERVICES [address] [place], [place], [address] Phone: [phone]   Fax:  [phone]  Physical Therapy Treatment  Patient Details  Name: Charles Choi MRN: [id number] Date of Birth: 1951/01/27 Referring Provider: Fulton Reek D    Encounter Date: 04/19/2018  PT End of Session - 04/19/18 1529    Visit Number  32    Number of Visits  33    Date for PT Re-Evaluation  04/25/18    PT Start Time  1529    PT Stop Time  1559    PT Time Calculation (min)  30 min    Equipment Utilized During Treatment  Gait belt    Activity Tolerance  Patient limited by fatigue    Behavior During Therapy  [facility] for tasks assessed/performed       Past Medical History:  Diagnosis Date  . Anxiety   . Cancer ([place])   . Depression   . Hypertension   . Parkinson's disease [facility])     Past Surgical History:  Procedure Laterality Date  . BACK SURGERY    . CHOLECYSTECTOMY    . DEEP BRAIN STIMULATOR PLACEMENT     for Parkinson's disease  . JOINT REPLACEMENT     left knee x2    There were no vitals filed for this visit.  Subjective Assessment - 04/19/18 1530    Subjective  Pt arrived 15 minutes late, limiting session. Pt had an appointment with his orthotist with 3 weeks time before it is complete.      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  Had PT at [facility] for 7 days.  Used the bike 6-10 minutes, as well as worked on walking, ROM for his R knee.  Started walking with a rw and is currently using a rollator walker.  Pt also adds that he was in a wc since October 2017 until the surgery due to pain. Patient has been on antiobiotic now due to the left knee being infected. The MD will test him in april to see if he continues to have an infection and if he needs to have any more surgery on the left knee. Patients baseline for ambulation is that he was able to ambulate with a spc  outdoors. He had PT in July for strengthening to be able to walk with a cane instead of the rollator.     Limitations  Walking;Standing    Patient Stated Goals  I want to walk without a walker, improve endurance.    Currently in Pain?  Yes    Pain Score  1     Pain Location  Foot pinky toe on R    Pain Orientation  Right    Pain Descriptors / Indicators  Aching    Pain Type  Chronic pain    Pain Onset  More than a month ago        TREATMENT  Vitals taken at start of session: BP 145/74, SpO2 95, pulse 96  Bridges with 5 sec hold x 15. Cues to place weight through heels for greater glute recruitment.  Single leg bridges with 5 sec hold x 15 Left leg and right leg  SLR BLE x 15  Hooklying hip abd/ER with GTB x 20 with 5 second holds  SAQ with 3 lbs x 15 with 5 sec hold  Hooklying marching with TA 2x15 each LE with GTB around  knees                         PT Education - 04/19/18 1529    Education provided  Yes    Education Details  Exercise technique    Person(s) Educated  Patient    Methods  Explanation;Demonstration;Verbal cues    Comprehension  Verbalized understanding;Returned demonstration;Verbal cues required;Need further instruction       PT Short Term Goals - 11/14/17 0848      PT SHORT TERM GOAL #1   Title  Pt will be able to perform sit <> stand transfer from a regular chair independently without UE assist to promote function.     Baseline  needs UE support    Time  4    Period  Weeks    Status  New    Target Date  12/11/17      PT SHORT TERM GOAL #2   Title  Patient will be able to stand at counter for 15 minutes to be able to complete a standing activity    Baseline  Patient has limited standing time with UE support and AD    Time  4    Period  Weeks    Status  New    Target Date  12/11/17        PT Long Term Goals - 03/20/18 1600      PT LONG TERM GOAL #1   Title  Patient will be independent in home exercise program to improve  strength/mobility for better functional independence with ADLs    Baseline  stretching and strengthening    Time  8    Period  Weeks    Status  Partially Met    Target Date  04/25/18      PT LONG TERM GOAL #2   Title  Patient will ascend/descend 4 stairs with rail assist independently and ability to bring his rollator to the top or bottom of the steps , without loss of balance to improve ability to get in/out of home    Baseline  Patient is able to ascend and descend steps with railing and CGA    Time  8    Period  Weeks    Status  Partially Met    Target Date  04/25/18      PT LONG TERM GOAL #3   Title  Patient will increase 10 meter walk test to >1.24ms as to improve gait speed for better community ambulation and to reduce fall risk.    Baseline  1.31 m/sec ;01/15/18= 1.31 m/sec, 02/20/18 1.31 m/sec 03/20/18=1.31    Time  8    Period  Weeks    Status  Achieved    Target Date  04/25/18      PT LONG TERM GOAL #4   Title  Patient (> 743years old) will complete five times sit to stand test in < 15 seconds indicating an increased LE strength and improved balance.    Baseline  18.22 sec,  01/15/18=  18.22, 02/20/17 =18.22 sec 03/20/18 = 14.83 sec    Time  8    Period  Weeks    Status  Achieved    Target Date  04/25/18      PT LONG TERM GOAL #5   Title  Patient will ambulate 50 feet indoors with spc without falls or using the furniture for support.    Baseline  spc 75 % of the time indoors  Time  8    Period  Weeks    Status  Achieved    Target Date  04/25/18            Plan - 04/19/18 1533    Clinical Impression Statement  Pt arrived late, limiting session.  When performing bridges the pt places weight through forefoot.  Cues to push through heel for improved glute recruitment.  Pt demonstrates fatigue with LE strengthening exercises in supine. Pt will benefit from continued skiled PT interventions for improved strength and independence.     Rehab Potential  Good    Clinical  Impairments Affecting Rehab Potential  chronicity of condition, Parkinson's disease, TKR BLE    PT Frequency  2x / week    PT Duration  8 weeks    PT Treatment/Interventions  Neuromuscular re-education;Manual techniques;Therapeutic exercise;Therapeutic activities;Aquatic Therapy;Gait training;Patient/family education;Cryotherapy;Stair training;Balance training;Dry needling;Taping    PT Next Visit Plan  Knee extension ROM, gait, functional strengthening, balance training    PT Home Exercise Plan  needs HEP next visit, 6 MW test, TUG test,     Consulted and Agree with Plan of Care  Patient       Patient will benefit from skilled therapeutic intervention in order to improve the following deficits and impairments:  Pain, Postural dysfunction, Improper body mechanics, Difficulty walking, Decreased strength, Decreased range of motion, Abnormal gait, Decreased balance, Decreased endurance, Decreased mobility, Impaired sensation, Decreased activity tolerance, Impaired flexibility  Visit Diagnosis: Muscle weakness (generalized)  Difficulty in walking, not elsewhere classified  Chronic pain of right knee     Problem List Patient Active Problem List   Diagnosis Date Noted  . Moderate recurrent major depression ([place]) 08/15/2016  . Chest pain due to myocardial ischemia 08/14/2016  . Chest pain, rule out acute myocardial infarction 08/14/2016    Collie Siad PT, DPT 04/19/2018, 3:57 PM  [place] MAIN [facility] SERVICES [address] [place], [place], [address] Phone: [phone]   Fax:  [phone]  Name: Charles Choi MRN: [id number] Date of Birth: 09/10/1951

## 2018-04-23 ENCOUNTER — Ambulatory Visit: Payer: Medicare Other | Admitting: Physical Therapy

## 2018-04-23 ENCOUNTER — Encounter: Payer: Self-pay | Admitting: Physical Therapy

## 2018-04-23 DIAGNOSIS — M6281 Muscle weakness (generalized): Secondary | ICD-10-CM

## 2018-04-23 DIAGNOSIS — R262 Difficulty in walking, not elsewhere classified: Secondary | ICD-10-CM

## 2018-04-23 DIAGNOSIS — G8929 Other chronic pain: Secondary | ICD-10-CM

## 2018-04-23 DIAGNOSIS — M25561 Pain in right knee: Secondary | ICD-10-CM

## 2018-04-23 NOTE — Therapy (Signed)
[place] MAIN [facility] SERVICES [address] [place], [place], [address] Phone: [phone]   Fax:  [phone]  Physical Therapy Treatment  Patient Details  Name: Charles Choi MRN: [id number] Date of Birth: 1951-10-24 Referring Provider: Fulton Reek D    Encounter Date: 04/23/2018  PT End of Session - 04/23/18 1541    Visit Number  33    Number of Visits  33    Date for PT Re-Evaluation  04/25/18    Equipment Utilized During Treatment  Gait belt    Activity Tolerance  Patient limited by fatigue    Behavior During Therapy  [facility] for tasks assessed/performed       Past Medical History:  Diagnosis Date  . Anxiety   . Cancer ([place])   . Depression   . Hypertension   . Parkinson's disease [facility])     Past Surgical History:  Procedure Laterality Date  . BACK SURGERY    . CHOLECYSTECTOMY    . DEEP BRAIN STIMULATOR PLACEMENT     for Parkinson's disease  . JOINT REPLACEMENT     left knee x2    There were no vitals filed for this visit.  Subjective Assessment - 04/23/18 1526    Subjective  Pt had an appointment with his orthotist and is getting a built up orthotic.       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  Had PT at [facility] for 7 days.  Used the bike 6-10 minutes, as well as worked on walking, ROM for his R knee.  Started walking with a rw and is currently using a rollator walker.  Pt also adds that he was in a wc since October 2017 until the surgery due to pain. Patient has been on antiobiotic now due to the left knee being infected. The MD will test him in april to see if he continues to have an infection and if he needs to have any more surgery on the left knee. Patients baseline for ambulation is that he was able to ambulate with a spc outdoors. He had PT in July for strengthening to be able to walk with a cane instead of the rollator.     Limitations  Walking;Standing     Patient Stated Goals  I want to walk without a walker, improve endurance.    Currently in Pain?  Yes    Pain Score  1     Pain Location  Foot    Pain Orientation  Right    Pain Descriptors / Indicators  Aching    Pain Type  Chronic pain    Pain Onset  More than a month ago    Aggravating Factors   walking    Multiple Pain Sites  No       Therapeutic Exercise:   Toe taps from foam to stool x 20 , cues for correct posture and intermediate need of UE's  Standing on foam with head turns and trunk rotation x 2 mins x 2   Seated hip Abd/ER with RTB around knees, and TA  3x10. Cues to control band during eccentric phase   Hooklying marching with TA x 40   Hooklying knee to chest with TA x 20  Bridging x 20   Standing back extension over parallel bars   Patient has no increased pain but left knee is 1/10 and right toe is 1/10.   Patient needs  cues for correct technique for above exercises.                        PT Education - 04/23/18 1528    Education provided  Yes    Education Details  HEP    Person(s) Educated  Patient    Methods  Explanation    Comprehension  Verbalized understanding       PT Short Term Goals - 11/14/17 0848      PT SHORT TERM GOAL #1   Title  Pt will be able to perform sit <> stand transfer from a regular chair independently without UE assist to promote function.     Baseline  needs UE support    Time  4    Period  Weeks    Status  New    Target Date  12/11/17      PT SHORT TERM GOAL #2   Title  Patient will be able to stand at counter for 15 minutes to be able to complete a standing activity    Baseline  Patient has limited standing time with UE support and AD    Time  4    Period  Weeks    Status  New    Target Date  12/11/17        PT Long Term Goals - 03/20/18 1600      PT LONG TERM GOAL #1   Title  Patient will be independent in home exercise program to improve strength/mobility for better functional  independence with ADLs    Baseline  stretching and strengthening    Time  8    Period  Weeks    Status  Partially Met    Target Date  04/25/18      PT LONG TERM GOAL #2   Title  Patient will ascend/descend 4 stairs with rail assist independently and ability to bring his rollator to the top or bottom of the steps , without loss of balance to improve ability to get in/out of home    Baseline  Patient is able to ascend and descend steps with railing and CGA    Time  8    Period  Weeks    Status  Partially Met    Target Date  04/25/18      PT LONG TERM GOAL #3   Title  Patient will increase 10 meter walk test to >1.75ms as to improve gait speed for better community ambulation and to reduce fall risk.    Baseline  1.31 m/sec ;01/15/18= 1.31 m/sec, 02/20/18 1.31 m/sec 03/20/18=1.31    Time  8    Period  Weeks    Status  Achieved    Target Date  04/25/18      PT LONG TERM GOAL #4   Title  Patient (> 732years old) will complete five times sit to stand test in < 15 seconds indicating an increased LE strength and improved balance.    Baseline  18.22 sec,  01/15/18=  18.22, 02/20/17 =18.22 sec 03/20/18 = 14.83 sec    Time  8    Period  Weeks    Status  Achieved    Target Date  04/25/18      PT LONG TERM GOAL #5   Title  Patient will ambulate 50 feet indoors with spc without falls or using the furniture for support.    Baseline  spc 75 % of the time indoors  Time  8    Period  Weeks    Status  Achieved    Target Date  04/25/18            Plan - 04/23/18 1546    Clinical Impression Statement  Pt presents with unsteadiness on uneven surfaces and fatigues with therapeutic exercises. Patient needs assist with beginning moderate balance activities and needs CGA assist with standing activities. Patient demonstrates difficulty with dynamic standing balance and with narrow  base of support and increased challenges for LE.  Patient tolerated all interventions well this date and skilled PT will  continue to improve mobility and strength.     Rehab Potential  Good    Clinical Impairments Affecting Rehab Potential  chronicity of condition, Parkinson's disease, TKR BLE    PT Frequency  2x / week    PT Duration  8 weeks    PT Treatment/Interventions  Neuromuscular re-education;Manual techniques;Therapeutic exercise;Therapeutic activities;Aquatic Therapy;Gait training;Patient/family education;Cryotherapy;Stair training;Balance training;Dry needling;Taping    PT Next Visit Plan  Knee extension ROM, gait, functional strengthening, balance training    PT Home Exercise Plan  needs HEP next visit, 6 MW test, TUG test,     Consulted and Agree with Plan of Care  Patient       Patient will benefit from skilled therapeutic intervention in order to improve the following deficits and impairments:  Pain, Postural dysfunction, Improper body mechanics, Difficulty walking, Decreased strength, Decreased range of motion, Abnormal gait, Decreased balance, Decreased endurance, Decreased mobility, Impaired sensation, Decreased activity tolerance, Impaired flexibility  Visit Diagnosis: Muscle weakness (generalized)  Difficulty in walking, not elsewhere classified  Chronic pain of right knee     Problem List Patient Active Problem List   Diagnosis Date Noted  . Moderate recurrent major depression ([place]) 08/15/2016  . Chest pain due to myocardial ischemia 08/14/2016  . Chest pain, rule out acute myocardial infarction 08/14/2016    Alanson Puls 04/23/2018, 3:48 PM  [place] MAIN [facility] SERVICES [address] [place], [place], [address] Phone: [phone]   Fax:  [phone]  Name: ULICES MAACK MRN: [id number] Date of Birth: 05/27/1951

## 2018-04-26 ENCOUNTER — Encounter: Payer: Self-pay | Admitting: Physical Therapy

## 2018-04-26 ENCOUNTER — Ambulatory Visit: Payer: Medicare Other | Admitting: Physical Therapy

## 2018-04-26 DIAGNOSIS — R262 Difficulty in walking, not elsewhere classified: Secondary | ICD-10-CM

## 2018-04-26 DIAGNOSIS — M25561 Pain in right knee: Secondary | ICD-10-CM

## 2018-04-26 DIAGNOSIS — M6281 Muscle weakness (generalized): Secondary | ICD-10-CM | POA: Diagnosis not present

## 2018-04-26 DIAGNOSIS — G8929 Other chronic pain: Secondary | ICD-10-CM

## 2018-04-26 NOTE — Therapy (Signed)
[place] MAIN [facility] SERVICES [address] [place], [place], [address] Phone: [phone]   Fax:  [phone]  Physical Therapy Treatment  Patient Details  Name: Charles Choi MRN: [id number] Date of Birth: [birth year] Referring Provider: Fulton Reek D    Encounter Date: 04/26/2018  PT End of Session - 04/26/18 1528    Visit Number  33    Number of Visits  33    Date for PT Re-Evaluation  06/21/18    PT Start Time  0315    PT Stop Time  0400    PT Time Calculation (min)  45 min    Equipment Utilized During Treatment  Gait belt    Activity Tolerance  Patient limited by fatigue    Behavior During Therapy  [facility] for tasks assessed/performed       Past Medical History:  Diagnosis Date  . Anxiety   . Cancer ([place])   . Depression   . Hypertension   . Parkinson's disease [facility])     Past Surgical History:  Procedure Laterality Date  . BACK SURGERY    . CHOLECYSTECTOMY    . DEEP BRAIN STIMULATOR PLACEMENT     for Parkinson's disease  . JOINT REPLACEMENT     left knee x2    There were no vitals filed for this visit.  Subjective Assessment - 04/26/18 1526    Subjective  Pt is doing well and has 1/10 pain to right foot. He is doing his stretching.     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  Had PT at [facility] for 7 days.  Used the bike 6-10 minutes, as well as worked on walking, ROM for his R knee.  Started walking with a rw and is currently using a rollator walker.  Pt also adds that he was in a wc since October 2017 until the surgery due to pain. Patient has been on antiobiotic now due to the left knee being infected. The MD will test him in april to see if he continues to have an infection and if he needs to have any more surgery on the left knee. Patients baseline for ambulation is that he was able to ambulate with a spc outdoors. He had PT in July for strengthening to be able  to walk with a cane instead of the rollator.     Limitations  Walking;Standing    Patient Stated Goals  I want to walk without a walker, improve endurance.    Currently in Pain?  Yes    Pain Score  1     Pain Location  Foot    Pain Orientation  Right    Pain Onset  More than a month ago      Treatment :   OUTCOME MEASURES: TEST  Outcome  Interpretation   5 times sit<>stand  14.83sec  >44 yo, >15 sec indicates increased risk for falls   10 meter walk test   1.3            m/s spc <1.0 m/s indicates increased risk for falls; limited community ambulator       6 minute walk test     550          Feet  1000 feet is community ambulator          Patient performs outcome measures and makes progress towards goals.  Neuromuscular training: Stepping over and back  x`10 bilaterally with verbal cueing for posture Side step and back to 6 inch stool x10 bilaterally fwd and bil side stepping over 1/2 foam roll with no UE support, 1x15 each, CGA for balance, min cues for heel strike with fwd step over BLE toe taps on 6" step, 2x10 each; cues for control of LE   CGA and Min  verbal cues used throughout with increased in postural sway and LOB most seen with narrow base of support and while on uneven surfaces. Continues to have balance deficits typical with diagnosis. Patient performs intermediate level exercises without pain behaviors but he does have minimal right toe pain.                    PT Education - 04/26/18 1527    Education provided  Yes    Education Details  HEP    Person(s) Educated  Patient    Methods  Explanation;Demonstration;Tactile cues;Verbal cues    Comprehension  Verbalized understanding;Returned demonstration;Verbal cues required       PT Short Term Goals - 11/14/17 0848      PT SHORT TERM GOAL #1   Title  Pt will be able to perform sit <> stand transfer from a regular chair independently without UE assist to promote function.     Baseline  needs UE  support    Time  4    Period  Weeks    Status  New    Target Date  12/11/17      PT SHORT TERM GOAL #2   Title  Patient will be able to stand at counter for 15 minutes to be able to complete a standing activity    Baseline  Patient has limited standing time with UE support and AD    Time  4    Period  Weeks    Status  New    Target Date  12/11/17        PT Long Term Goals - 04/26/18 1532      PT LONG TERM GOAL #1   Title  Patient will be independent in home exercise program to improve strength/mobility for better functional independence with ADLs    Baseline  stretching and strengthening    Time  8    Period  Weeks    Status  Partially Met    Target Date  06/21/18      PT LONG TERM GOAL #2   Title  Patient will ascend/descend 4 stairs with rail assist independently and ability to bring his rollator to the top or bottom of the steps , without loss of balance to improve ability to get in/out of home    Baseline  Patient is able to ascend and descend steps with railing and CGA    Time  8    Period  Weeks    Status  Partially Met    Target Date  06/21/18      PT LONG TERM GOAL #3   Title  Patient will increase 10 meter walk test to >1.65ms as to improve gait speed for better community ambulation and to reduce fall risk.    Baseline  1.31 m/sec ;01/15/18= 1.31 m/sec, 02/20/18 1.31 m/sec 03/20/18=1.31    Time  8    Period  Weeks    Status  Achieved      PT LONG TERM GOAL #4   Title  Patient (> 754years old) will complete five times sit to stand test in < 15  seconds indicating an increased LE strength and improved balance.    Baseline  18.22 sec,  01/15/18=  18.22, 02/20/17 =18.22 sec 03/20/18 = 14.83 sec    Time  8    Period  Weeks    Status  Achieved      PT LONG TERM GOAL #5   Title  Patient will ambulate 50 feet indoors with spc without falls or using the furniture for support.    Baseline  spc 75 % of the time indoors    Time  8    Period  Weeks    Status  Achieved       Additional Long Term Goals   Additional Long Term Goals  Yes Patient will increase six minute walk test distance to >1000 for progression to community ambulator and improve gait ability            Plan - 04/26/18 1530    Clinical Impression Statement  Patient instructed in advanced strengthening exercises due to difficulty with gait today and poor motor control. Patient fatigues quickly requiring short rest breaks in between advanced exercise. Patient instructed in advanced strengthening with increased repetition/resistance.Patient is having a right foot orthotic made to help support his flat foot and right foot pain. He is also going to schedule L shoulder surgery. Patient would benefit from additional skilled PT intervention to improve gait safety and reduce fall risk.    Rehab Potential  Good    Clinical Impairments Affecting Rehab Potential  chronicity of condition, Parkinson's disease, TKR BLE    PT Frequency  2x / week    PT Duration  8 weeks    PT Treatment/Interventions  Neuromuscular re-education;Manual techniques;Therapeutic exercise;Therapeutic activities;Aquatic Therapy;Gait training;Patient/family education;Cryotherapy;Stair training;Balance training;Dry needling;Taping    PT Next Visit Plan  Knee extension ROM, gait, functional strengthening, balance training    PT Home Exercise Plan  needs HEP next visit, 6 MW test, TUG test,     Consulted and Agree with Plan of Care  Patient       Patient will benefit from skilled therapeutic intervention in order to improve the following deficits and impairments:  Pain, Postural dysfunction, Improper body mechanics, Difficulty walking, Decreased strength, Decreased range of motion, Abnormal gait, Decreased balance, Decreased endurance, Decreased mobility, Impaired sensation, Decreased activity tolerance, Impaired flexibility  Visit Diagnosis: Muscle weakness (generalized)  Difficulty in walking, not elsewhere classified  Chronic pain  of right knee     Problem List Patient Active Problem List   Diagnosis Date Noted  . Moderate recurrent major depression ([place]) 08/15/2016  . Chest pain due to myocardial ischemia 08/14/2016  . Chest pain, rule out acute myocardial infarction 08/14/2016    Alanson Puls, [place] DPT 04/26/2018, 4:02 PM  [place] MAIN [facility] SERVICES [address] [place], [place], [address] Phone: [phone]   Fax:  [phone]  Name: Charles Choi MRN: [id number] Date of Birth: 06-10-1951

## 2018-04-30 ENCOUNTER — Encounter: Payer: Self-pay | Admitting: Physical Therapy

## 2018-04-30 ENCOUNTER — Ambulatory Visit: Payer: Medicare Other | Admitting: Physical Therapy

## 2018-04-30 DIAGNOSIS — G8929 Other chronic pain: Secondary | ICD-10-CM

## 2018-04-30 DIAGNOSIS — M25561 Pain in right knee: Secondary | ICD-10-CM

## 2018-04-30 DIAGNOSIS — M6281 Muscle weakness (generalized): Secondary | ICD-10-CM

## 2018-04-30 DIAGNOSIS — R262 Difficulty in walking, not elsewhere classified: Secondary | ICD-10-CM

## 2018-04-30 NOTE — Therapy (Addendum)
[place] MAIN [facility] SERVICES [address] [place], [place], [address] Phone: [phone]   Fax:  [phone]  Physical Therapy Treatment  Patient Details  Name: Charles Choi MRN: [id number] Date of Birth: [birth year] Referring Provider: Fulton Reek    Encounter Date: 04/30/2018  PT End of Session - 04/30/18 1501    Visit Number  35    Number of Visits  51    Date for PT Re-Evaluation  06/21/18    Authorization Type  Medicare     Authorization Time Period  04/25/18-06/21/18    PT Start Time  1435    PT Stop Time  1515    PT Time Calculation (min)  40 min    Activity Tolerance  Patient limited by fatigue;No increased pain    Behavior During Therapy  WFL for tasks assessed/performed       Past Medical History:  Diagnosis Date  . Anxiety   . Cancer ([place])   . Depression   . Hypertension   . Parkinson's disease [facility])     Past Surgical History:  Procedure Laterality Date  . BACK SURGERY    . CHOLECYSTECTOMY    . DEEP BRAIN STIMULATOR PLACEMENT     for Parkinson's disease  . JOINT REPLACEMENT     left knee x2    There were no vitals filed for this visit.  Subjective Assessment - 04/30/18 1436    Subjective  Pt reports he is happy with progress overall. He notes the progress has been slow, but he is very pleased with how far he has come. He notes that some othe rmedicla issues have limited his progress but theses have been addressed. He continues to wait for his custom orthotic for his Rt foot, which should come in on July 3.     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  Had PT at [facility] for 7 days.  Used the bike 6-10 minutes, as well as worked on walking, ROM for his R knee.  Started walking with a rw and is currently using a rollator walker.  Pt also adds that he was in a wc since October 2017 until the surgery due to pain. Patient has been on antiobiotic now due to the  left knee being infected. The MD will test him in april to see if he continues to have an infection and if he needs to have any more surgery on the left knee. Patients baseline for ambulation is that he was able to ambulate with a spc outdoors. He had PT in July for strengthening to be able to walk with a cane instead of the rollator.     Limitations  Walking;Standing    How long can you sit comfortably?  Not limited.     How long can you stand comfortably?  60-90 seconds.     How long can you walk comfortably?  15-30 minutes, limited by Right foot pain which has declined since starting PT.     Patient Stated Goals  I want to walk without a walker, improve endurance.    Currently in Pain?  Yes    Pain Score  1     Pain Location  -- right foot     Pain Onset  More than a month ago    Pain Frequency  Intermittent very few periods of pain free.     Aggravating Factors   walking  [facility] PT Assessment - 04/30/18 0001      Assessment   Medical Diagnosis  ataxia     Referring Provider  Fulton Reek     Onset Date/Surgical Date  10/26/17    Hand Dominance  Right    Next MD Visit  05/01/18    Prior Therapy  Patient had outpatient therapy in july following his knee surgery       Precautions   Precautions  Fall      Balance Screen   Has the patient fallen in the past 6 months  Yes    How many times?  1-2    Has the patient had a decrease in activity level because of a fear of falling?   No    Is the patient reluctant to leave their home because of a fear of falling?   Yes         Interventions This Date: -Hooklying Marching 2x10 alternating sides, VC for abdominal engagement -Hooklying bridge 2x15 -Straight leg bridge 2x10 bilat elevated on chair, slight knee flexion -Dead Bug (legs only): 3x5 bilat -STS from chair, shoulders in flexion with ball 1x10 + overhead reach with ball for balance and weight shift.    PT Short Term Goals - 11/14/17 0848      PT SHORT TERM GOAL  #1   Title  Pt will be able to perform sit <> stand transfer from a regular chair independently without UE assist to promote function.     Baseline  needs UE support    Time  4    Period  Weeks    Status  New    Target Date  12/11/17      PT SHORT TERM GOAL #2   Title  Patient will be able to stand at counter for 15 minutes to be able to complete a standing activity    Baseline  Patient has limited standing time with UE support and AD    Time  4    Period  Weeks    Status  New    Target Date  12/11/17        PT Long Term Goals - 04/26/18 1532      PT LONG TERM GOAL #1   Title  Patient will be independent in home exercise program to improve strength/mobility for better functional independence with ADLs    Baseline  stretching and strengthening    Time  8    Period  Weeks    Status  Partially Met    Target Date  06/21/18      PT LONG TERM GOAL #2   Title  Patient will ascend/descend 4 stairs with rail assist independently and ability to bring his rollator to the top or bottom of the steps , without loss of balance to improve ability to get in/out of home    Baseline  Patient is able to ascend and descend steps with railing and CGA    Time  8    Period  Weeks    Status  Partially Met    Target Date  06/21/18      PT LONG TERM GOAL #3   Title  Patient will increase 10 meter walk test to >1.57ms as to improve gait speed for better community ambulation and to reduce fall risk.    Baseline  1.31 m/sec ;01/15/18= 1.31 m/sec, 02/20/18 1.31 m/sec 03/20/18=1.31    Time  8    Period  Weeks    Status  Achieved      PT LONG TERM GOAL #4   Title  Patient (> 67 years old) will complete five times sit to stand test in < 15 seconds indicating an increased LE strength and improved balance.    Baseline  18.22 sec,  01/15/18=  18.22, 02/20/17 =18.22 sec 03/20/18 = 14.83 sec    Time  8    Period  Weeks    Status  Achieved      PT LONG TERM GOAL #5   Title  Patient will ambulate 50 feet  indoors with spc without falls or using the furniture for support.    Baseline  spc 75 % of the time indoors    Time  8    Period  Weeks    Status  Achieved      Additional Long Term Goals   Additional Long Term Goals  Yes Patient will increase six minute walk test distance to >1000 for progression to community ambulator and improve gait ability            Plan - 04/30/18 1504    Clinical Impression Statement  Recertification discussed with patient this date, planned for addition 8 weeks do to continued progress in overall funciton. Pt remains somewhat limited by foot condition, but is able to continue to progress. Added in more balance components this session as well,     Rehab Potential  Good    Clinical Impairments Affecting Rehab Potential  chronicity of condition, Parkinson's disease, TKR BLE    PT Frequency  2x / week    PT Duration  8 weeks    PT Treatment/Interventions  Neuromuscular re-education;Manual techniques;Therapeutic exercise;Therapeutic activities;Aquatic Therapy;Gait training;Patient/family education;Cryotherapy;Stair training;Balance training;Dry needling;Taping    PT Next Visit Plan  Knee extension ROM, gait, functional strengthening, balance training    PT Home Exercise Plan  Update HEP next visit.     Consulted and Agree with Plan of Care  Patient       Patient will benefit from skilled therapeutic intervention in order to improve the following deficits and impairments:  Pain, Postural dysfunction, Improper body mechanics, Difficulty walking, Decreased strength, Decreased range of motion, Abnormal gait, Decreased balance, Decreased endurance, Decreased mobility, Impaired sensation, Decreased activity tolerance, Impaired flexibility  Visit Diagnosis: Muscle weakness (generalized)  Difficulty in walking, not elsewhere classified  Chronic pain of right knee     Problem List Patient Active Problem List   Diagnosis Date Noted  . Moderate recurrent major  depression ([place]) 08/15/2016  . Chest pain due to myocardial ischemia 08/14/2016  . Chest pain, rule out acute myocardial infarction 08/14/2016     3:15 PM, 04/30/18 Etta Grandchild, PT, DPT Physical Therapist - [place] [facility] 04/30/2018, 3:13 PM  [place] MAIN [facility] SERVICES [address] [place], [place], [address] Phone: [phone]   Fax:  [phone]  Name: ZAHEER WAGEMAN MRN: [id number] Date of Birth: 21-Feb-1951

## 2018-05-02 ENCOUNTER — Ambulatory Visit: Payer: Medicare Other | Admitting: Physical Therapy

## 2018-05-08 ENCOUNTER — Ambulatory Visit: Payer: Medicare Other | Admitting: Physical Therapy

## 2018-05-10 ENCOUNTER — Emergency Department
Admission: EM | Admit: 2018-05-10 | Discharge: 2018-05-10 | Disposition: A | Discharge status: Home / Self Care | Payer: Medicare Other | Attending: Emergency Medicine | Admitting: Emergency Medicine

## 2018-05-10 ENCOUNTER — Encounter: Payer: Self-pay | Admitting: Emergency Medicine

## 2018-05-10 ENCOUNTER — Emergency Department: Payer: Medicare Other

## 2018-05-10 ENCOUNTER — Other Ambulatory Visit: Payer: Self-pay

## 2018-05-10 DIAGNOSIS — R1031 Right lower quadrant pain: Secondary | ICD-10-CM | POA: Diagnosis not present

## 2018-05-10 DIAGNOSIS — Z79899 Other long term (current) drug therapy: Secondary | ICD-10-CM | POA: Diagnosis not present

## 2018-05-10 DIAGNOSIS — N200 Calculus of kidney: Secondary | ICD-10-CM | POA: Insufficient documentation

## 2018-05-10 DIAGNOSIS — G20 Parkinson's disease: Secondary | ICD-10-CM | POA: Diagnosis not present

## 2018-05-10 DIAGNOSIS — I10 Essential (primary) hypertension: Secondary | ICD-10-CM | POA: Insufficient documentation

## 2018-05-10 DIAGNOSIS — Z7982 Long term (current) use of aspirin: Secondary | ICD-10-CM | POA: Insufficient documentation

## 2018-05-10 LAB — CBC WITH DIFFERENTIAL/PLATELET
BASOS PCT: 1 %
Basophils Absolute: 0 10*3/uL (ref 0–0.1)
Eosinophils Absolute: 0.4 10*3/uL (ref 0–0.7)
Eosinophils Relative: 4 %
HEMATOCRIT: 39 % — AB (ref 40.0–52.0)
HEMOGLOBIN: 13 g/dL (ref 13.0–18.0)
Lymphocytes Relative: 22 %
Lymphs Abs: 1.8 10*3/uL (ref 1.0–3.6)
MCH: 25.5 pg — ABNORMAL LOW (ref 26.0–34.0)
MCHC: 33.3 g/dL (ref 32.0–36.0)
MCV: 76.4 fL — ABNORMAL LOW (ref 80.0–100.0)
Monocytes Absolute: 1.1 10*3/uL — ABNORMAL HIGH (ref 0.2–1.0)
Monocytes Relative: 13 %
NEUTROS ABS: 4.9 10*3/uL (ref 1.4–6.5)
NEUTROS PCT: 60 %
Platelets: 195 10*3/uL (ref 150–440)
RBC: 5.11 MIL/uL (ref 4.40–5.90)
RDW: 16.7 % — ABNORMAL HIGH (ref 11.5–14.5)
WBC: 8.2 10*3/uL (ref 3.8–10.6)

## 2018-05-10 LAB — COMPREHENSIVE METABOLIC PANEL
ALBUMIN: 3.9 g/dL (ref 3.5–5.0)
ALT: 25 U/L (ref 0–44)
ANION GAP: 9 (ref 5–15)
AST: 32 U/L (ref 15–41)
Alkaline Phosphatase: 72 U/L (ref 38–126)
BUN: 22 mg/dL (ref 8–23)
CO2: 24 mmol/L (ref 22–32)
Calcium: 8.7 mg/dL — ABNORMAL LOW (ref 8.9–10.3)
Chloride: 108 mmol/L (ref 98–111)
Creatinine, Ser: 1.33 mg/dL — ABNORMAL HIGH (ref 0.61–1.24)
GFR calc Af Amer: >60 mL/min (ref >60)
GFR calc non Af Amer: 54 mL/min — ABNORMAL LOW (ref >60)
GLUCOSE: 120 mg/dL — AB (ref 70–99)
POTASSIUM: 4 mmol/L (ref 3.5–5.1)
Sodium: 141 mmol/L (ref 135–145)
Total Bilirubin: 1 mg/dL (ref 0.3–1.2)
Total Protein: 6.6 g/dL (ref 6.5–8.1)

## 2018-05-10 LAB — LIPASE, BLOOD: LIPASE: 29 U/L (ref 11–51)

## 2018-05-10 LAB — LACTIC ACID, PLASMA: Lactic Acid, Venous: 1.7 mmol/L (ref 0.5–1.9)

## 2018-05-10 MED ORDER — SODIUM CHLORIDE 0.9 % IV BOLUS
1000.0000 mL | Freq: Once | INTRAVENOUS | Status: AC
  Administered 2018-05-10: 1000 mL via INTRAVENOUS

## 2018-05-10 MED ORDER — ONDANSETRON 4 MG PO TBDP: 4.0000 mg | ORAL_TABLET | Freq: Three times a day (TID) | ORAL | 0 refills | Status: DC | PRN

## 2018-05-10 MED ORDER — IBUPROFEN 600 MG PO TABS: 600.0000 mg | ORAL_TABLET | Freq: Three times a day (TID) | ORAL | 0 refills | Status: DC | PRN

## 2018-05-10 MED ORDER — TAMSULOSIN HCL 0.4 MG PO CAPS: 0.4000 mg | ORAL_CAPSULE | Freq: Every day | ORAL | 0 refills | Status: DC

## 2018-05-10 MED ORDER — IOHEXOL 300 MG/ML  SOLN
100.0000 mL | Freq: Once | INTRAMUSCULAR | Status: AC | PRN
  Administered 2018-05-10: 100 mL via INTRAVENOUS

## 2018-05-10 MED ORDER — HYDROCODONE-ACETAMINOPHEN 5-325 MG PO TABS: 1.0000 | ORAL_TABLET | Freq: Four times a day (QID) | ORAL | 0 refills | Status: DC | PRN

## 2018-05-10 MED ORDER — MORPHINE SULFATE (PF) 4 MG/ML IV SOLN
6.0000 mg | Freq: Once | INTRAVENOUS | Status: AC
Start: 2018-05-10 — End: 2018-05-10
  Administered 2018-05-10: 6 mg via INTRAVENOUS
  Filled 2018-05-10: qty 2

## 2018-05-10 MED ORDER — KETOROLAC TROMETHAMINE 30 MG/ML IJ SOLN
15.0000 mg | Freq: Once | INTRAMUSCULAR | Status: AC
  Administered 2018-05-10: 08:00:00 via INTRAVENOUS
  Filled 2018-05-10: qty 1

## 2018-05-10 MED ORDER — ONDANSETRON HCL 4 MG/2ML IJ SOLN
4.0000 mg | Freq: Once | INTRAMUSCULAR | Status: AC
  Administered 2018-05-10: 4 mg via INTRAVENOUS
  Filled 2018-05-10: qty 2

## 2018-05-10 NOTE — ED Notes (Signed)
Patient transported to CT 

## 2018-05-10 NOTE — ED Notes (Signed)
Pt taken to POV in wheelchair. His sister in law is with him and DD. VSS. NAD. Discharge instructions, RX and follow up given. All questions addressed.

## 2018-05-10 NOTE — Discharge Instructions (Signed)
It was a pleasure to take care of you today, and thank you for coming to our emergency department.  If you have any questions or concerns before leaving please ask the nurse to grab me and I'm more than happy to go through your aftercare instructions again.  If you were prescribed any opioid pain medication today such as Norco, Vicodin, Percocet, morphine, hydrocodone, or oxycodone please make sure you do not drive when you are taking this medication as it can alter your ability to drive safely.  If you have any concerns once you are home that you are not improving or are in fact getting worse before you can make it to your follow-up appointment, please do not hesitate to call [phone] and come back for further evaluation.  Darel Hong, MD  Results for orders placed or performed during the hospital encounter of 05/10/18  Comprehensive metabolic panel  Result Value Ref Range   Sodium 141 135 - 145 mmol/L   Potassium 4.0 3.5 - 5.1 mmol/L   Chloride 108 98 - 111 mmol/L   CO2 24 22 - 32 mmol/L   Glucose, Bld 120 (H) 70 - 99 mg/dL   BUN 22 8 - 23 mg/dL   Creatinine, Ser 1.33 (H) 0.61 - 1.24 mg/dL   Calcium 8.7 (L) 8.9 - 10.3 mg/dL   Total Protein 6.6 6.5 - 8.1 g/dL   Albumin 3.9 3.5 - 5.0 g/dL   AST 32 15 - 41 U/L   ALT 25 0 - 44 U/L   Alkaline Phosphatase 72 38 - 126 U/L   Total Bilirubin 1.0 0.3 - 1.2 mg/dL   GFR calc non Af Amer 54 (L) >60 mL/min   GFR calc Af Amer >60 >60 mL/min   Anion gap 9 5 - 15  Lipase, blood  Result Value Ref Range   Lipase 29 11 - 51 U/L  CBC with Differential  Result Value Ref Range   WBC 8.2 3.8 - 10.6 K/uL   RBC 5.11 4.40 - 5.90 MIL/uL   Hemoglobin 13.0 13.0 - 18.0 g/dL   HCT 39.0 (L) 40.0 - 52.0 %   MCV 76.4 (L) 80.0 - 100.0 fL   MCH 25.5 (L) 26.0 - 34.0 pg   MCHC 33.3 32.0 - 36.0 g/dL   RDW 16.7 (H) 11.5 - 14.5 %   Platelets 195 150 - 440 K/uL   Neutrophils Relative % 60 %   Neutro Abs 4.9 1.4 - 6.5 K/uL   Lymphocytes Relative 22 %   Lymphs Abs  1.8 1.0 - 3.6 K/uL   Monocytes Relative 13 %   Monocytes Absolute 1.1 (H) 0.2 - 1.0 K/uL   Eosinophils Relative 4 %   Eosinophils Absolute 0.4 0 - 0.7 K/uL   Basophils Relative 1 %   Basophils Absolute 0.0 0 - 0.1 K/uL  Lactic acid, plasma  Result Value Ref Range   Lactic Acid, Venous 1.7 0.5 - 1.9 mmol/L   Ct Abdomen Pelvis W Contrast  Result Date: 05/10/2018 CLINICAL DATA:  Right lower quadrant pain for several hours, initial encounter EXAM: CT ABDOMEN AND PELVIS WITH CONTRAST TECHNIQUE: Multidetector CT imaging of the abdomen and pelvis was performed using the standard protocol following bolus administration of intravenous contrast. CONTRAST:  166mL OMNIPAQUE IOHEXOL 300 MG/ML  SOLN COMPARISON:  None. FINDINGS: Lower chest: Mild bibasilar atelectatic changes are noted likely of a dependent nature. Hepatobiliary: Mild fatty infiltration of the liver is noted. The gallbladder has been surgically removed. Mild fullness of the  biliary ductal system is noted related to the post cholecystectomy state. Pancreas: Unremarkable. No pancreatic ductal dilatation or surrounding inflammatory changes. Spleen: Normal in size without focal abnormality. Adrenals/Urinary Tract: Adrenal glands are within normal limits bilaterally. The left kidney demonstrates no renal calculi or obstructive changes. The ureter is within normal limits. The bladder is partially distended. Right kidney demonstrates hydronephrosis as well as hydroureter at extends to the level of the right ureterovesical junction. There is a small 4 mm obstructing stone identified at that level best seen on image number 72 of series 2. Stomach/Bowel: No obstructive or inflammatory changes are seen. The appendix is within normal limits. Vascular/Lymphatic: Aortic atherosclerosis. No enlarged abdominal or pelvic lymph nodes. Reproductive: Prostate is unremarkable. Other: No abdominal wall hernia or abnormality. No abdominopelvic ascites. Musculoskeletal:  Postsurgical and degenerative changes of lumbar spine are noted. No acute bony abnormality is seen. IMPRESSION: 4 mm right UVJ stone with obstructive change. Chronic changes as described above. Electronically Signed   By: Inez Catalina M.D.   On: 05/10/2018 07:44

## 2018-05-10 NOTE — ED Triage Notes (Signed)
Patient coming in from home with [facility] for RT lower abd pain that woke patient up around 430am. Had some nausea. EMS gave 4 zofran. Has had gallbladder removed.

## 2018-05-10 NOTE — ED Provider Notes (Addendum)
[facility] Emergency Department Provider Note  ____________________________________________   First MD Initiated Contact with Patient 05/10/18 [phone]     (approximate)  I have reviewed the triage vital signs and the nursing notes.   HISTORY  Chief Complaint Abdominal Pain   HPI Charles Choi is a 67 y.o. male who comes to the emergency department by EMS with severe right lower quadrant pain that awoke him from sleep.  Associated with nausea.  The pain is nonradiating.  He has not vomited.  No diarrhea.  His last bowel movement flatus were about an hour prior to arrival.  He does have a remote history of cholecystectomy as well as an anterior approach to a back fusion.  He was pain-free last night when he went to bed.  His pain is now constant and severe.  Nonradiating.  Worse with movement somewhat improved with rest.  No testicular pain.  No trauma.  No back pain.  No dysuria.  No hematuria.    Past Medical History:  Diagnosis Date  . Anxiety   . Cancer ([place])   . Depression   . Hypertension   . Parkinson's disease [facility])     Patient Active Problem List   Diagnosis Date Noted  . Moderate recurrent major depression ([place]) 08/15/2016  . Chest pain due to myocardial ischemia 08/14/2016  . Chest pain, rule out acute myocardial infarction 08/14/2016    Past Surgical History:  Procedure Laterality Date  . BACK SURGERY    . CHOLECYSTECTOMY    . DEEP BRAIN STIMULATOR PLACEMENT     for Parkinson's disease  . JOINT REPLACEMENT     left knee x2    Prior to Admission medications   Medication Sig Start Date End Date Taking? Authorizing Provider  amantadine (SYMMETREL) 100 MG capsule Take 100 mg by mouth daily. 07/09/15   [provider]  BABY ASPIRIN PO Take 81 mg by mouth.    [provider]  carbidopa-levodopa (SINEMET IR) 25-100 MG per tablet Take 0.5-1 tablets by mouth See admin instructions. Take 1 tablet orally two times a day at 7am,  and 10pm. Take 0.5 tablet orally at 10am, 1pm, 4pm, and 7pm. 07/20/15   [provider]  escitalopram (LEXAPRO) 10 MG tablet Take 1 tablet (10 mg total) by mouth daily. 08/15/16   Clapacs, Madie Reno, MD  etodolac (LODINE) 200 MG capsule Take 200 mg by mouth every 8 (eight) hours.    [provider]  gabapentin (NEURONTIN) 300 MG capsule Take 300 mg by mouth 3 (three) times daily.    [provider]  HYDROcodone-acetaminophen (NORCO) 5-325 MG tablet Take 1 tablet by mouth every 6 (six) hours as needed for up to 15 doses for severe pain. 05/10/18   Darel Hong, MD  ibuprofen (ADVIL,MOTRIN) 600 MG tablet Take 1 tablet (600 mg total) by mouth every 8 (eight) hours as needed. 05/10/18   Darel Hong, MD  losartan (COZAAR) 50 MG tablet Take 50 mg by mouth daily. 07/07/15   [provider]  Melatonin 3 MG CAPS Take by mouth.    [provider]  meloxicam (MOBIC) 7.5 MG tablet Take 7.5 mg by mouth daily.    [provider]  omeprazole (PRILOSEC OTC) 20 MG tablet Take 20 mg by mouth 2 (two) times daily.    [provider]  ondansetron (ZOFRAN ODT) 4 MG disintegrating tablet Take 1 tablet (4 mg total) by mouth every 8 (eight) hours as needed for nausea  or vomiting. 05/10/18   Darel Hong, MD  rOPINIRole (REQUIP) 2 MG tablet Take 2 mg by mouth 6 (six) times daily. At 7am, and 1pm. 07/20/15   [provider]  selegiline (ELDEPRYL) 5 MG capsule Take 5 mg by mouth 2 (two) times daily. At 7am and 1pm. 07/09/15   [provider]  tamsulosin (FLOMAX) 0.4 MG CAPS capsule Take 1 capsule (0.4 mg total) by mouth daily. 05/10/18   Darel Hong, MD  traMADol (ULTRAM) 50 MG tablet Take 50 mg by mouth every 6 (six) hours as needed.    [provider]  traZODone (DESYREL) 150 MG tablet Take 100 mg by mouth at bedtime.  06/18/15   [provider]    Allergies Phenergan [promethazine hcl] and Reglan [metoclopramide]  No family  history on file.  Social History Social History   Tobacco Use  . Smoking status: Never Smoker  . Smokeless tobacco: Never Used  Substance Use Topics  . Alcohol use: Yes    Comment: 1 drink per week  . Drug use: Yes    Types: Marijuana    Review of Systems Constitutional: No fever/chills Eyes: No visual changes. ENT: No sore throat. Cardiovascular: Denies chest pain. Respiratory: Denies shortness of breath. Gastrointestinal: Positive for abdominal pain.  Positive for nausea, no vomiting.  No diarrhea.  No constipation. Genitourinary: Negative for dysuria. Musculoskeletal: Negative for back pain. Skin: Negative for rash. Neurological: Negative for headaches, focal weakness or numbness.   ____________________________________________   PHYSICAL EXAM:  VITAL SIGNS: ED Triage Vitals  Enc Vitals Group     BP      Pulse      Resp      Temp      Temp src      SpO2      Weight      Height      Head Circumference      Peak Flow      Pain Score      Pain Loc      Pain Edu?      Excl. in [place]?     Constitutional: Alert and oriented x4 appears extremely uncomfortable moaning in bed splinting and holding his right lower quadrant Eyes: PERRL EOMI. Head: Atraumatic. Nose: No congestion/rhinnorhea. Mouth/Throat: No trismus Neck: No stridor.   Cardiovascular: Normal rate, regular rhythm. Grossly normal heart sounds.  Good peripheral circulation. Respiratory: Normal respiratory effort.  No retractions. Lungs CTAB and moving good air Gastrointestinal: Soft abdomen somewhat tender in the right lower quadrant although with no frank peritonitis.  Negative Rovsing's.  No costovertebral tenderness Musculoskeletal: No lower extremity edema   Neurologic:  Normal speech and language. No gross focal neurologic deficits are appreciated. Skin:  Skin is warm, dry and intact. No rash noted. Psychiatric: Mood and affect are normal. Speech and behavior are  normal.    ____________________________________________   DIFFERENTIAL includes but not limited to  Appendicitis, small bowel obstruction, volvulus, nephrolithiasis ____________________________________________   LABS (all labs ordered are listed, but only abnormal results are displayed)  Labs Reviewed  COMPREHENSIVE METABOLIC PANEL - Abnormal; Notable for the following components:      Result Value   Glucose, Bld 120 (*)    Creatinine, Ser 1.33 (*)    Calcium 8.7 (*)    GFR calc non Af Amer 54 (*)    All other components within normal limits  CBC WITH DIFFERENTIAL/PLATELET - Abnormal; Notable for the following components:   HCT 39.0 (*)  MCV 76.4 (*)    MCH 25.5 (*)    RDW 16.7 (*)    Monocytes Absolute 1.1 (*)    All other components within normal limits  LIPASE, BLOOD  LACTIC ACID, PLASMA  URINALYSIS, COMPLETE (UACMP) WITH MICROSCOPIC    Lab work reviewed by me with no acute disease __________________________________________  EKG   ____________________________________________  RADIOLOGY  CT abdomen pelvis reviewed by me shows 4 mm kidney stone on the right stuck at the UVJ ____________________________________________   PROCEDURES  Procedure(s) performed: no  Procedures  Critical Care performed: no  ____________________________________________   INITIAL IMPRESSION / ASSESSMENT AND PLAN / ED COURSE  Pertinent labs & imaging results that were available during my care of the patient were reviewed by me and considered in my medical decision making (see chart for details).   The patient arrives extremely uncomfortable appearing although hemodynamically stable.  He is somewhat tender to his right lower quadrant.  Differential is broad but I have to consider appendicitis versus obstruction etc.  6 mg of IV morphine and 4 mg of IV ondansetron now for pain and nausea.  Fluids.  The patient is n.p.o. and will require a CT scan with IV contrast.  Care signed  over to Dr. Cherylann Banas who will follow up on the results and determine the ultimate disposition.     ______----------------------------------------- 7:17 AM on 05/10/2018 -----------------------------------------  Following morphine the patient's pain is improved.  CT scan is pending.  ______________________________________  ----------------------------------------- 7:55 AM on 05/10/2018 -----------------------------------------  CT shows a 4 mm right-sided stone.  Pain has returned as well give him Toradol now.  No evidence of renal dysfunction or infection.  I anticipate outpatient management with urological follow-up.   FINAL CLINICAL IMPRESSION(S) / ED DIAGNOSES  Final diagnoses:  Right lower quadrant abdominal pain  Kidney stone      NEW MEDICATIONS STARTED DURING THIS VISIT:  New Prescriptions   HYDROCODONE-ACETAMINOPHEN (NORCO) 5-325 MG TABLET    Take 1 tablet by mouth every 6 (six) hours as needed for up to 15 doses for severe pain.   IBUPROFEN (ADVIL,MOTRIN) 600 MG TABLET    Take 1 tablet (600 mg total) by mouth every 8 (eight) hours as needed.   ONDANSETRON (ZOFRAN ODT) 4 MG DISINTEGRATING TABLET    Take 1 tablet (4 mg total) by mouth every 8 (eight) hours as needed for nausea or vomiting.   TAMSULOSIN (FLOMAX) 0.4 MG CAPS CAPSULE    Take 1 capsule (0.4 mg total) by mouth daily.     Note:  This document was prepared using Dragon voice recognition software and may include unintentional dictation errors.     Darel Hong, MD 05/10/18 8416    Darel Hong, MD 05/10/18 [phone]

## 2018-05-10 NOTE — ED Notes (Signed)
Pt returned from Ct at this time

## 2018-05-14 ENCOUNTER — Ambulatory Visit: Payer: Medicare Other | Attending: Internal Medicine

## 2018-05-14 VITALS — BP 134/87 | HR 104

## 2018-05-14 DIAGNOSIS — R262 Difficulty in walking, not elsewhere classified: Secondary | ICD-10-CM | POA: Insufficient documentation

## 2018-05-14 DIAGNOSIS — M25561 Pain in right knee: Secondary | ICD-10-CM | POA: Diagnosis present

## 2018-05-14 DIAGNOSIS — G8929 Other chronic pain: Secondary | ICD-10-CM | POA: Insufficient documentation

## 2018-05-14 DIAGNOSIS — M6281 Muscle weakness (generalized): Secondary | ICD-10-CM | POA: Insufficient documentation

## 2018-05-14 NOTE — Therapy (Signed)
[place] MAIN [facility] SERVICES [address] [place], [place], [address] Phone: [phone]   Fax:  [phone]  Physical Therapy Treatment  Patient Details  Name: Charles Choi MRN: [id number] Date of Birth: Apr 06, 1951 Referring Provider: Fulton Reek    Encounter Date: 05/14/2018  PT End of Session - 05/14/18 1445    Visit Number  36    Number of Visits  51    Date for PT Re-Evaluation  06/21/18    Authorization Type  Medicare     Authorization Time Period  04/25/18-06/21/18    PT Start Time  1438    PT Stop Time  1516    PT Time Calculation (min)  38 min    Activity Tolerance  Patient limited by fatigue;No increased pain    Behavior During Therapy  WFL for tasks assessed/performed       Past Medical History:  Diagnosis Date  . Anxiety   . Cancer ([place])   . Depression   . Hypertension   . Parkinson's disease [facility])     Past Surgical History:  Procedure Laterality Date  . BACK SURGERY    . CHOLECYSTECTOMY    . DEEP BRAIN STIMULATOR PLACEMENT     for Parkinson's disease  . JOINT REPLACEMENT     left knee x2    Vitals:   05/14/18 1443  BP: 134/87  Pulse: (!) 104  SpO2: 95%    Subjective Assessment - 05/14/18 1439    Subjective  Pt reports that he passed his kidney stone 05/11/18. No more abdominal pain. He is complaining of some pain in his R great toe and second toe today. He reports that he fractured his R 5th digit 3 weeks ago and it is still sore but is healing. He and his wife are meeting with a divorce attorney tomorrow at 16:15 and then he will be going out of town for a couple weeks.    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  Had PT at [facility] for 7 days.  Used the bike 6-10 minutes, as well as worked on walking, ROM for his R knee.  Started walking with a rw and is currently using a rollator walker.  Pt also adds that he was in a wc since October 2017 until the  surgery due to pain. Patient has been on antiobiotic now due to the left knee being infected. The MD will test him in april to see if he continues to have an infection and if he needs to have any more surgery on the left knee. Patients baseline for ambulation is that he was able to ambulate with a spc outdoors. He had PT in July for strengthening to be able to walk with a cane instead of the rollator.     Limitations  Walking;Standing    How long can you sit comfortably?  Not limited.     How long can you stand comfortably?  60-90 seconds.     How long can you walk comfortably?  15-30 minutes, limited by Right foot pain which has declined since starting PT.     Patient Stated Goals  I want to walk without a walker, improve endurance.    Currently in Pain?  Yes    Pain Score  3     Pain Location  Toe (Comment which one) R great toe and R second toe    Pain Orientation  Right    Pain Descriptors / Indicators  Aching    Pain Type  Chronic pain    Pain Onset  More than a month ago        TREATMENT  Ther-ex  Hooklying SLR 2 x 10 bilateral; Hooklying bridge 3 x 15; Bicycles x 15 bilateral; Manually resisted lumbar rotation 3s hold x 10; STS from chair, shoulders in flexion with 4# weighted ball 2 x 10  Neuromuscular Re-education Stepping laterally as well as forward/backwards over 1/2 foam roller (round side up) with verbal cueing for posture and alternating LEs; BLE toe taps on 6" step, 2x10 each; cues for control of LE;  CGA and Min  verbal cues used throughout with increased in postural sway and LOB most seen with narrow base of support. Continues to have balance deficits typical with diagnosis. Patient performs intermediate level exercises without pain behaviors but he does have minimal right toe pain which does not appear to worsen during session.                  PT Education - 05/14/18 1444    Education provided  Yes    Education Details  exercise form/technique     Person(s) Educated  Patient    Methods  Explanation    Comprehension  Verbalized understanding       PT Short Term Goals - 11/14/17 0848      PT SHORT TERM GOAL #1   Title  Pt will be able to perform sit <> stand transfer from a regular chair independently without UE assist to promote function.     Baseline  needs UE support    Time  4    Period  Weeks    Status  New    Target Date  12/11/17      PT SHORT TERM GOAL #2   Title  Patient will be able to stand at counter for 15 minutes to be able to complete a standing activity    Baseline  Patient has limited standing time with UE support and AD    Time  4    Period  Weeks    Status  New    Target Date  12/11/17        PT Long Term Goals - 04/26/18 1532      PT LONG TERM GOAL #1   Title  Patient will be independent in home exercise program to improve strength/mobility for better functional independence with ADLs    Baseline  stretching and strengthening    Time  8    Period  Weeks    Status  Partially Met    Target Date  06/21/18      PT LONG TERM GOAL #2   Title  Patient will ascend/descend 4 stairs with rail assist independently and ability to bring his rollator to the top or bottom of the steps , without loss of balance to improve ability to get in/out of home    Baseline  Patient is able to ascend and descend steps with railing and CGA    Time  8    Period  Weeks    Status  Partially Met    Target Date  06/21/18      PT LONG TERM GOAL #3   Title  Patient will increase 10 meter walk test to >1.95ms as to improve gait speed for better community ambulation and to reduce fall risk.    Baseline  1.31 m/sec ;01/15/18= 1.31 m/sec,  02/20/18 1.31 m/sec 03/20/18=1.31    Time  8    Period  Weeks    Status  Achieved      PT LONG TERM GOAL #4   Title  Patient (> 84 years old) will complete five times sit to stand test in < 15 seconds indicating an increased LE strength and improved balance.    Baseline  18.22 sec,  01/15/18=   18.22, 02/20/17 =18.22 sec 03/20/18 = 14.83 sec    Time  8    Period  Weeks    Status  Achieved      PT LONG TERM GOAL #5   Title  Patient will ambulate 50 feet indoors with spc without falls or using the furniture for support.    Baseline  spc 75 % of the time indoors    Time  8    Period  Weeks    Status  Achieved      Additional Long Term Goals   Additional Long Term Goals  Yes Patient will increase six minute walk test distance to >1000 for progression to community ambulator and improve gait ability            Plan - 05/14/18 1446    Clinical Impression Statement  Pt thinks that he may be ready to stop therapy sometime soon. He is leaving town for the next two weeks and then will readdress this with his primary therapist upon return. He is able to complete all exercises and balance activities as instructed without any increase in difficulty due to his toe pain. No reported increase in pain during session. Pt encouraged to continue his HEP and follow-up as scheduled.    Rehab Potential  Good    Clinical Impairments Affecting Rehab Potential  chronicity of condition, Parkinson's disease, TKR BLE    PT Frequency  2x / week    PT Duration  8 weeks    PT Treatment/Interventions  Neuromuscular re-education;Manual techniques;Therapeutic exercise;Therapeutic activities;Aquatic Therapy;Gait training;Patient/family education;Cryotherapy;Stair training;Balance training;Dry needling;Taping    PT Next Visit Plan  Knee extension ROM, gait, functional strengthening, balance training    PT Home Exercise Plan  Update HEP next visit.     Consulted and Agree with Plan of Care  Patient       Patient will benefit from skilled therapeutic intervention in order to improve the following deficits and impairments:  Pain, Postural dysfunction, Improper body mechanics, Difficulty walking, Decreased strength, Decreased range of motion, Abnormal gait, Decreased balance, Decreased endurance, Decreased  mobility, Impaired sensation, Decreased activity tolerance, Impaired flexibility  Visit Diagnosis: Muscle weakness (generalized)  Difficulty in walking, not elsewhere classified     Problem List Patient Active Problem List   Diagnosis Date Noted  . Moderate recurrent major depression ([place]) 08/15/2016  . Chest pain due to myocardial ischemia 08/14/2016  . Chest pain, rule out acute myocardial infarction 08/14/2016   Phillips Grout PT, DPT, GCS  Huprich,Jason 05/15/2018, 1:29 PM  [place] MAIN [facility] SERVICES [address] [place], [place], [address] Phone: [phone]   Fax:  [phone]  Name: HAROUN COTHAM MRN: [id number] Date of Birth: 16-Nov-1950

## 2018-05-16 ENCOUNTER — Ambulatory Visit: Payer: Medicare Other | Admitting: Physical Therapy

## 2018-05-22 ENCOUNTER — Ambulatory Visit: Payer: Medicare Other | Admitting: Physical Therapy

## 2018-05-24 ENCOUNTER — Ambulatory Visit: Payer: Medicare Other | Admitting: Physical Therapy

## 2018-05-28 ENCOUNTER — Ambulatory Visit: Payer: Medicare Other | Admitting: Physical Therapy

## 2018-05-28 ENCOUNTER — Encounter: Payer: Self-pay | Admitting: Physical Therapy

## 2018-05-28 DIAGNOSIS — M25561 Pain in right knee: Secondary | ICD-10-CM

## 2018-05-28 DIAGNOSIS — M6281 Muscle weakness (generalized): Secondary | ICD-10-CM

## 2018-05-28 DIAGNOSIS — G8929 Other chronic pain: Secondary | ICD-10-CM

## 2018-05-28 DIAGNOSIS — R262 Difficulty in walking, not elsewhere classified: Secondary | ICD-10-CM

## 2018-05-28 NOTE — Therapy (Signed)
[place] MAIN [facility] SERVICES [address] [place], [place], [address] Phone: [phone]   Fax:  [phone]  Physical Therapy Treatment  Patient Details  Name: Charles Choi MRN: [id number] Date of Birth: [birth year] Referring Provider: Fulton Reek    Encounter Date: 05/28/2018  PT End of Session - 05/28/18 1731    Visit Number  37    Number of Visits  83    Date for PT Re-Evaluation  06/21/18    Authorization Type  Medicare     Authorization Time Period  04/25/18-06/21/18    PT Start Time  0500    PT Stop Time  0540    PT Time Calculation (min)  40 min    Activity Tolerance  Patient limited by fatigue;No increased pain    Behavior During Therapy  WFL for tasks assessed/performed       Past Medical History:  Diagnosis Date  . Anxiety   . Cancer ([place])   . Depression   . Hypertension   . Parkinson's disease [facility])     Past Surgical History:  Procedure Laterality Date  . BACK SURGERY    . CHOLECYSTECTOMY    . DEEP BRAIN STIMULATOR PLACEMENT     for Parkinson's disease  . JOINT REPLACEMENT     left knee x2    There were no vitals filed for this visit.  Subjective Assessment - 05/28/18 1731    Subjective  Patient is feeling down today. having right foot pain.     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  Had PT at [facility] for 7 days.  Used the bike 6-10 minutes, as well as worked on walking, ROM for his R knee.  Started walking with a rw and is currently using a rollator walker.  Pt also adds that he was in a wc since October 2017 until the surgery due to pain. Patient has been on antiobiotic now due to the left knee being infected. The MD will test him in april to see if he continues to have an infection and if he needs to have any more surgery on the left knee. Patients baseline for ambulation is that he was able to ambulate with a spc outdoors. He had PT in July for  strengthening to be able to walk with a cane instead of the rollator.     Limitations  Walking;Standing    How long can you sit comfortably?  Not limited.     How long can you stand comfortably?  60-90 seconds.     How long can you walk comfortably?  15-30 minutes, limited by Right foot pain which has declined since starting PT.     Patient Stated Goals  I want to walk without a walker, improve endurance.    Currently in Pain?  Yes    Pain Score  5     Pain Location  Foot    Pain Orientation  Right    Pain Descriptors / Indicators  Burning    Pain Onset  More than a month ago    Multiple Pain Sites  Yes lefft shoulder 2/10         Therapeutic exercise: LAQ with 2 # BLE x 15 x 2 with 3 sec hold Marching with 2 # x 15 x 2 BLE Hip extension standing with kneeextx 15 BLE Hip abd sidelying left and right x 15 BLE hooklying abd/ER x  15 x 2 with RTB Hooklying marching with 2 lbs x 15 x 2 Bridges x 10 Standing hip abdx 15 x 2BLE Leg press 90 lbs x 20 x 2  Lunges to BOSU ball x 15 BLE and UE asssit in parallel bars ankle inversion with YTB x 10 x 2   Patient is having right foot pain today and not able to perform standing exercises as well. .                    PT Education - 05/28/18 1731    Education provided  Yes    Person(s) Educated  Patient    Methods  Explanation    Comprehension  Verbalized understanding;Returned demonstration       PT Short Term Goals - 11/14/17 0848      PT SHORT TERM GOAL #1   Title  Pt will be able to perform sit <> stand transfer from a regular chair independently without UE assist to promote function.     Baseline  needs UE support    Time  4    Period  Weeks    Status  New    Target Date  12/11/17      PT SHORT TERM GOAL #2   Title  Patient will be able to stand at counter for 15 minutes to be able to complete a standing activity    Baseline  Patient has limited standing time with UE support and AD    Time  4     Period  Weeks    Status  New    Target Date  12/11/17        PT Long Term Goals - 05/28/18 1739      PT LONG TERM GOAL #1   Title  Patient will be independent in home exercise program to improve strength/mobility for better functional independence with ADLs    Baseline  stretching and strengthening    Time  8    Period  Weeks    Status  On-going    Target Date  06/21/18      PT LONG TERM GOAL #2   Title  Patient will ascend/descend 4 stairs with rail assist independently and ability to bring his rollator to the top or bottom of the steps , without loss of balance to improve ability to get in/out of home    Baseline  Patient is able to ascend and descend steps with railing and CGA    Time  8    Period  Weeks    Status  On-going    Target Date  06/21/18      PT LONG TERM GOAL #3   Title  Patient will increase 10 meter walk test to >1.29m/s as to improve gait speed for better community ambulation and to reduce fall risk.    Baseline  1.31 m/sec ;01/15/18= 1.31 m/sec, 02/20/18 1.31 m/sec 03/20/18=1.31    Time  8    Period  Weeks    Status  Achieved    Target Date  06/21/18      PT LONG TERM GOAL #4   Title  Patient (> 66 years old) will complete five times sit to stand test in < 15 seconds indicating an increased LE strength and improved balance.    Baseline  18.22 sec,  01/15/18=  18.22, 02/20/17 =18.22 sec 03/20/18 = 14.83 sec    Time  8    Period  Weeks    Status  Achieved    Target Date  06/21/18      PT LONG TERM GOAL #5   Title  Patient will ambulate 50 feet indoors with spc without falls or using the furniture for support.    Baseline  spc 75 % of the time indoors    Time  8    Period  Weeks    Status  Achieved    Target Date  06/21/18            Plan - 05/28/18 1732    Clinical Impression Statement  Pt presents with unsteadiness on uneven surfaces and fatigues with therapeutic exercises. Patient needs assist with beginning moderate balance activities and needs  CGA assist with standing activities. Patient demonstrates difficulty with dynamic standing balance and with narrow base of support and increased challenges for LE. Patient tolerated all interventions well this date and skilled PT will continue to improve mobility and strength    Rehab Potential  Good    Clinical Impairments Affecting Rehab Potential  chronicity of condition, Parkinson's disease, TKR BLE    PT Frequency  2x / week    PT Duration  8 weeks    PT Treatment/Interventions  Neuromuscular re-education;Manual techniques;Therapeutic exercise;Therapeutic activities;Aquatic Therapy;Gait training;Patient/family education;Cryotherapy;Stair training;Balance training;Dry needling;Taping    PT Next Visit Plan  Knee extension ROM, gait, functional strengthening, balance training    PT Home Exercise Plan  Update HEP next visit.     Consulted and Agree with Plan of Care  Patient       Patient will benefit from skilled therapeutic intervention in order to improve the following deficits and impairments:  Pain, Postural dysfunction, Improper body mechanics, Difficulty walking, Decreased strength, Decreased range of motion, Abnormal gait, Decreased balance, Decreased endurance, Decreased mobility, Impaired sensation, Decreased activity tolerance, Impaired flexibility  Visit Diagnosis: Muscle weakness (generalized)  Difficulty in walking, not elsewhere classified  Chronic pain of right knee     Problem List Patient Active Problem List   Diagnosis Date Noted  . Moderate recurrent major depression ([place]) 08/15/2016  . Chest pain due to myocardial ischemia 08/14/2016  . Chest pain, rule out acute myocardial infarction 08/14/2016    Alanson Puls, [place] DPT 05/28/2018, 5:40 PM  [place] MAIN [facility] SERVICES [address] [place], [place], [address] Phone: [phone]   Fax:  [phone]  Name: JAMALL STROHMEIER MRN: [id number] Date of Birth:  05/03/1951

## 2018-05-30 ENCOUNTER — Encounter: Payer: Self-pay | Admitting: Physical Therapy

## 2018-05-30 ENCOUNTER — Ambulatory Visit: Payer: Medicare Other | Admitting: Physical Therapy

## 2018-05-30 DIAGNOSIS — R262 Difficulty in walking, not elsewhere classified: Secondary | ICD-10-CM

## 2018-05-30 DIAGNOSIS — M25561 Pain in right knee: Secondary | ICD-10-CM

## 2018-05-30 DIAGNOSIS — M6281 Muscle weakness (generalized): Secondary | ICD-10-CM | POA: Diagnosis not present

## 2018-05-30 DIAGNOSIS — G8929 Other chronic pain: Secondary | ICD-10-CM

## 2018-05-30 NOTE — Therapy (Signed)
[place] MAIN [facility] SERVICES [address] [place], [place], [address] Phone: [phone]   Fax:  [phone]  Physical Therapy Treatment  Patient Details  Name: Charles Choi MRN: [id number] Date of Birth: 02/20/1951 Referring Provider: Fulton Reek    Encounter Date: 05/30/2018  PT End of Session - 05/30/18 1429    Visit Number  38    Number of Visits  51    Date for PT Re-Evaluation  06/21/18    Authorization Type  Medicare     Authorization Time Period  04/25/18-06/21/18    PT Start Time  0232    PT Stop Time  0315    PT Time Calculation (min)  43 min    Equipment Utilized During Treatment  Gait belt    Activity Tolerance  Patient limited by fatigue;No increased pain    Behavior During Therapy  WFL for tasks assessed/performed       Past Medical History:  Diagnosis Date  . Anxiety   . Cancer ([place])   . Depression   . Hypertension   . Parkinson's disease [facility])     Past Surgical History:  Procedure Laterality Date  . BACK SURGERY    . CHOLECYSTECTOMY    . DEEP BRAIN STIMULATOR PLACEMENT     for Parkinson's disease  . JOINT REPLACEMENT     left knee x2    There were no vitals filed for this visit.  Subjective Assessment - 05/30/18 1428    Subjective  Patient is feeling better today. having right foot pain.     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  Had PT at [facility] for 7 days.  Used the bike 6-10 minutes, as well as worked on walking, ROM for his R knee.  Started walking with a rw and is currently using a rollator walker.  Pt also adds that he was in a wc since October 2017 until the surgery due to pain. Patient has been on antiobiotic now due to the left knee being infected. The MD will test him in april to see if he continues to have an infection and if he needs to have any more surgery on the left knee. Patients baseline for ambulation is that he was able to  ambulate with a spc outdoors. He had PT in July for strengthening to be able to walk with a cane instead of the rollator.     Limitations  Walking;Standing    How long can you sit comfortably?  Not limited.     How long can you stand comfortably?  60-90 seconds.     How long can you walk comfortably?  15-30 minutes, limited by Right foot pain which has declined since starting PT.     Patient Stated Goals  I want to walk without a walker, improve endurance.    Pain Onset  More than a month ago       Therapeutic Exercise:   Toe taps from foam to stool x 20 , cues for correct posture and intermediate need of UE's  Standing on foam with head turns and trunk rotation x 2 mins x 2   Seated hip Abd/ER with RTB around knees, and TA  3x10. Cues to control band during eccentric phase   Hooklying marching with TA x 40   Hooklying knee to chest with TA x 20  Bridging x 20   Standing back extension over  parallel bars   Patient has no increased pain but left knee is 1/10 and right toe is 1/10.   Patient needs cues for correct technique for above exercises.                         PT Education - 05/30/18 1428    Education provided  Yes    Education Details  HEP    Person(s) Educated  Patient    Methods  Explanation;Tactile cues    Comprehension  Verbalized understanding;Returned demonstration       PT Short Term Goals - 11/14/17 0848      PT SHORT TERM GOAL #1   Title  Pt will be able to perform sit <> stand transfer from a regular chair independently without UE assist to promote function.     Baseline  needs UE support    Time  4    Period  Weeks    Status  New    Target Date  12/11/17      PT SHORT TERM GOAL #2   Title  Patient will be able to stand at counter for 15 minutes to be able to complete a standing activity    Baseline  Patient has limited standing time with UE support and AD    Time  4    Period  Weeks    Status  New    Target Date   12/11/17        PT Long Term Goals - 05/28/18 1739      PT LONG TERM GOAL #1   Title  Patient will be independent in home exercise program to improve strength/mobility for better functional independence with ADLs    Baseline  stretching and strengthening    Time  8    Period  Weeks    Status  On-going    Target Date  06/21/18      PT LONG TERM GOAL #2   Title  Patient will ascend/descend 4 stairs with rail assist independently and ability to bring his rollator to the top or bottom of the steps , without loss of balance to improve ability to get in/out of home    Baseline  Patient is able to ascend and descend steps with railing and CGA    Time  8    Period  Weeks    Status  On-going    Target Date  06/21/18      PT LONG TERM GOAL #3   Title  Patient will increase 10 meter walk test to >1.39m/s as to improve gait speed for better community ambulation and to reduce fall risk.    Baseline  1.31 m/sec ;01/15/18= 1.31 m/sec, 02/20/18 1.31 m/sec 03/20/18=1.31    Time  8    Period  Weeks    Status  Achieved    Target Date  06/21/18      PT LONG TERM GOAL #4   Title  Patient (> 62 years old) will complete five times sit to stand test in < 15 seconds indicating an increased LE strength and improved balance.    Baseline  18.22 sec,  01/15/18=  18.22, 02/20/17 =18.22 sec 03/20/18 = 14.83 sec    Time  8    Period  Weeks    Status  Achieved    Target Date  06/21/18      PT LONG TERM GOAL #5   Title  Patient will ambulate 50 feet indoors with  spc without falls or using the furniture for support.    Baseline  spc 75 % of the time indoors    Time  8    Period  Weeks    Status  Achieved    Target Date  06/21/18            Plan - 05/30/18 1429    Clinical Impression Statement  Patient instructed in advanced strengthening exercises due to difficulty with gait today and poor motor control. Patient fatigues quickly requiring short rest breaks in between advanced exercise. Patient  instructed in advanced strengthening with increased repetition/resistance.Patient is having a right foot orthotic made to help support his flat foot and right foot pain. He is also going to schedule L shoulder surgery. Patient would benefit from additional skilled PT intervention to improve gait safety and reduce fall risk    Rehab Potential  Good    Clinical Impairments Affecting Rehab Potential  chronicity of condition, Parkinson's disease, TKR BLE    PT Frequency  2x / week    PT Duration  8 weeks    PT Treatment/Interventions  Neuromuscular re-education;Manual techniques;Therapeutic exercise;Therapeutic activities;Aquatic Therapy;Gait training;Patient/family education;Cryotherapy;Stair training;Balance training;Dry needling;Taping    PT Next Visit Plan  Knee extension ROM, gait, functional strengthening, balance training    PT Home Exercise Plan  Update HEP next visit.     Consulted and Agree with Plan of Care  Patient       Patient will benefit from skilled therapeutic intervention in order to improve the following deficits and impairments:  Pain, Postural dysfunction, Improper body mechanics, Difficulty walking, Decreased strength, Decreased range of motion, Abnormal gait, Decreased balance, Decreased endurance, Decreased mobility, Impaired sensation, Decreased activity tolerance, Impaired flexibility  Visit Diagnosis: Muscle weakness (generalized)  Difficulty in walking, not elsewhere classified  Chronic pain of right knee     Problem List Patient Active Problem List   Diagnosis Date Noted  . Moderate recurrent major depression ([place]) 08/15/2016  . Chest pain due to myocardial ischemia 08/14/2016  . Chest pain, rule out acute myocardial infarction 08/14/2016    Alanson Puls, [place] DPT 05/30/2018, 3:08 PM  [place] MAIN [facility] SERVICES [address] [place], [place], [address] Phone: [phone]   Fax:  [phone]  Name: Charles Choi MRN: [id number] Date of Birth: [birth year]

## 2018-06-04 ENCOUNTER — Encounter: Payer: Self-pay | Admitting: Physical Therapy

## 2018-06-04 ENCOUNTER — Ambulatory Visit: Payer: Medicare Other | Admitting: Physical Therapy

## 2018-06-04 DIAGNOSIS — M6281 Muscle weakness (generalized): Secondary | ICD-10-CM | POA: Diagnosis not present

## 2018-06-04 DIAGNOSIS — R262 Difficulty in walking, not elsewhere classified: Secondary | ICD-10-CM

## 2018-06-04 DIAGNOSIS — G8929 Other chronic pain: Secondary | ICD-10-CM

## 2018-06-04 DIAGNOSIS — M25561 Pain in right knee: Secondary | ICD-10-CM

## 2018-06-04 NOTE — Therapy (Signed)
[place] MAIN [facility] SERVICES [address] [place], [place], [address] Phone: [phone]   Fax:  [phone]  Physical Therapy Treatment  Patient Details  Name: Charles Choi MRN: [id number] Date of Birth: 1950/12/22 Referring Provider: Fulton Reek    Encounter Date: 06/04/2018  PT End of Session - 06/04/18 1517    Visit Number  39    Number of Visits  51    Date for PT Re-Evaluation  06/21/18    Authorization Type  Medicare     Authorization Time Period  04/25/18-06/21/18    PT Start Time  0310    PT Stop Time  0350    PT Time Calculation (min)  40 min    Equipment Utilized During Treatment  Gait belt    Activity Tolerance  Patient limited by fatigue;No increased pain    Behavior During Therapy  WFL for tasks assessed/performed       Past Medical History:  Diagnosis Date  . Anxiety   . Cancer ([place])   . Depression   . Hypertension   . Parkinson's disease [facility]     Past Surgical History:  Procedure Laterality Date  . BACK SURGERY    . CHOLECYSTECTOMY    . DEEP BRAIN STIMULATOR PLACEMENT     for Parkinson's disease  . JOINT REPLACEMENT     left knee x2    There were no vitals filed for this visit.  Therapeutic exercise:  Nu-step x 5 mins ( warm up)   sidelying hip add x 15 x 2   Hooklying marching with TA x 40   Hooklying knee to chest with TA x 20  SLR with intervals BLE x 15 x 2   Bridging x 20 x 2   Lunges from foam to BOSU ball x 15 x 2 sets  Patient has no increased pain but left knee is 1/10 and right toe is 1/10.   Patient needs cues for correct technique for above exercises.                           PT Short Term Goals - 11/14/17 0848      PT SHORT TERM GOAL #1   Title  Pt will be able to perform sit <> stand transfer from a regular chair independently without UE assist to promote function.     Baseline  needs UE support    Time  4    Period  Weeks    Status  New     Target Date  12/11/17      PT SHORT TERM GOAL #2   Title  Patient will be able to stand at counter for 15 minutes to be able to complete a standing activity    Baseline  Patient has limited standing time with UE support and AD    Time  4    Period  Weeks    Status  New    Target Date  12/11/17        PT Long Term Goals - 05/28/18 1739      PT LONG TERM GOAL #1   Title  Patient will be independent in home exercise program to improve strength/mobility for better functional independence with ADLs    Baseline  stretching and strengthening    Time  8    Period  Weeks    Status  On-going    Target Date  06/21/18  PT LONG TERM GOAL #2   Title  Patient will ascend/descend 4 stairs with rail assist independently and ability to bring his rollator to the top or bottom of the steps , without loss of balance to improve ability to get in/out of home    Baseline  Patient is able to ascend and descend steps with railing and CGA    Time  8    Period  Weeks    Status  On-going    Target Date  06/21/18      PT LONG TERM GOAL #3   Title  Patient will increase 10 meter walk test to >1.67m/s as to improve gait speed for better community ambulation and to reduce fall risk.    Baseline  1.31 m/sec ;01/15/18= 1.31 m/sec, 02/20/18 1.31 m/sec 03/20/18=1.31    Time  8    Period  Weeks    Status  Achieved    Target Date  06/21/18      PT LONG TERM GOAL #4   Title  Patient (> 59 years old) will complete five times sit to stand test in < 15 seconds indicating an increased LE strength and improved balance.    Baseline  18.22 sec,  01/15/18=  18.22, 02/20/17 =18.22 sec 03/20/18 = 14.83 sec    Time  8    Period  Weeks    Status  Achieved    Target Date  06/21/18      PT LONG TERM GOAL #5   Title  Patient will ambulate 50 feet indoors with spc without falls or using the furniture for support.    Baseline  spc 75 % of the time indoors    Time  8    Period  Weeks    Status  Achieved    Target Date   06/21/18            Plan - 06/04/18 1518    Clinical Impression Statement  Patient instructed in intermediate balance/strengthening exercise. Patient fatigues quickly requiring short rest breaks in between intermediate exercise. Patient instructed in advanced strengthening with increased repetition/resistance. Patient requires CGA for intermediate balance exercise especially with less rail assist. Patient would benefit from additional skilled PT intervention to improve balance/gait safety and reduce fall risk.    Rehab Potential  Good    Clinical Impairments Affecting Rehab Potential  chronicity of condition, Parkinson's disease, TKR BLE    PT Frequency  2x / week    PT Duration  8 weeks    PT Treatment/Interventions  Neuromuscular re-education;Manual techniques;Therapeutic exercise;Therapeutic activities;Aquatic Therapy;Gait training;Patient/family education;Cryotherapy;Stair training;Balance training;Dry needling;Taping    PT Next Visit Plan  Knee extension ROM, gait, functional strengthening, balance training    PT Home Exercise Plan  Update HEP next visit.     Consulted and Agree with Plan of Care  Patient       Patient will benefit from skilled therapeutic intervention in order to improve the following deficits and impairments:  Pain, Postural dysfunction, Improper body mechanics, Difficulty walking, Decreased strength, Decreased range of motion, Abnormal gait, Decreased balance, Decreased endurance, Decreased mobility, Impaired sensation, Decreased activity tolerance, Impaired flexibility  Visit Diagnosis: Muscle weakness (generalized)  Difficulty in walking, not elsewhere classified  Chronic pain of right knee     Problem List Patient Active Problem List   Diagnosis Date Noted  . Moderate recurrent major depression ([place]) 08/15/2016  . Chest pain due to myocardial ischemia 08/14/2016  . Chest pain, rule out acute myocardial infarction 08/14/2016  [address] [place] DPT 06/04/2018, 3:19 PM  [place] MAIN [facility] SERVICES [address] [place], [place], [address] Phone: [phone]   Fax:  [phone]  Name: Charles Choi MRN: [id number] Date of Birth: [birth year]

## 2018-06-06 ENCOUNTER — Encounter: Payer: Self-pay | Admitting: Physical Therapy

## 2018-06-06 ENCOUNTER — Ambulatory Visit: Payer: Medicare Other | Admitting: Physical Therapy

## 2018-06-06 DIAGNOSIS — R262 Difficulty in walking, not elsewhere classified: Secondary | ICD-10-CM

## 2018-06-06 DIAGNOSIS — M25561 Pain in right knee: Secondary | ICD-10-CM

## 2018-06-06 DIAGNOSIS — G8929 Other chronic pain: Secondary | ICD-10-CM

## 2018-06-06 DIAGNOSIS — M6281 Muscle weakness (generalized): Secondary | ICD-10-CM | POA: Diagnosis not present

## 2018-06-06 NOTE — Therapy (Signed)
[place] MAIN [facility] SERVICES [address] [place], [place], [address] Phone: [phone]   Fax:  [phone]  Physical Therapy Treatment. Physical Therapy Progress Note   Dates of reporting period  04/10/18  to  06/06/18  Patient Details  Name: Charles Choi MRN: [id number] Date of Birth: 07-Sep-1951 Referring Provider: Fulton Reek    Encounter Date: 06/06/2018  PT End of Session - 06/06/18 1616    Visit Number  40    Number of Visits  51    Date for PT Re-Evaluation  06/21/18    Authorization Type  Medicare     Authorization Time Period  04/25/18-06/21/18    PT Start Time  0411    PT Stop Time  0449    PT Time Calculation (min)  38 min    Equipment Utilized During Treatment  Gait belt    Activity Tolerance  Patient limited by fatigue;No increased pain    Behavior During Therapy  WFL for tasks assessed/performed       Past Medical History:  Diagnosis Date  . Anxiety   . Cancer ([place])   . Depression   . Hypertension   . Parkinson's disease [facility])     Past Surgical History:  Procedure Laterality Date  . BACK SURGERY    . CHOLECYSTECTOMY    . DEEP BRAIN STIMULATOR PLACEMENT     for Parkinson's disease  . JOINT REPLACEMENT     left knee x2    There were no vitals filed for this visit.  Subjective Assessment - 06/06/18 1614    Subjective  Patient is feeling better today. having right foot pain.     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  Had PT at [facility] for 7 days.  Used the bike 6-10 minutes, as well as worked on walking, ROM for his R knee.  Started walking with a rw and is currently using a rollator walker.  Pt also adds that he was in a wc since October 2017 until the surgery due to pain. Patient has been on antiobiotic now due to the left knee being infected. The MD will test him in april to see if he continues to have an infection and if he needs to have any more  surgery on the left knee. Patients baseline for ambulation is that he was able to ambulate with a spc outdoors. He had PT in July for strengthening to be able to walk with a cane instead of the rollator.     Limitations  Walking;Standing    How long can you sit comfortably?  Not limited.     How long can you stand comfortably?  60-90 seconds.     How long can you walk comfortably?  15-30 minutes, limited by Right foot pain which has declined since starting PT.     Patient Stated Goals  I want to walk without a walker, improve endurance.    Currently in Pain?  Yes    Pain Score  1     Pain Orientation  Right    Pain Descriptors / Indicators  Aching    Pain Type  Chronic pain    Pain Onset  More than a month ago    Aggravating Factors   walking    Effect of Pain on Daily Activities  needs to rest     Multiple Pain Sites  -- left shoulder 1/10  NEUROMUSCULAR RE-EDUCATION Rocker board x 2 mins with right LE fwd, and then 2 mins with Left LE fwd  foam  standing and tapping to 6 inch stool x 20 leading with left LE and then right LE standing hip abd with YTB x 20   side stepping left and right in parallel bars 10 feet x 3 Stepping fwd and bwd without ue support and weight shift over stance leg x 10 BLE marching in parallel bars x 20 SLR with intervals 30 deg , 60 deg x 15 BLE hooklying hip ER/ abd with TA x 40 with RTB  Trunk rotation x 15 left and right sidelying hip abd x 15 BLE sidelying hip flex/ ext x 15 BLE  Patient has decreased standing tolerance due to right foot pain and fatigue in standing position.                         PT Education - 06/06/18 1616    Education provided  Yes    Education Details  HEP    Person(s) Educated  Patient    Methods  Explanation    Comprehension  Verbalized understanding       PT Short Term Goals - 11/14/17 0848      PT SHORT TERM GOAL #1   Title  Pt will be able to perform sit <> stand transfer from a regular  chair independently without UE assist to promote function.     Baseline  needs UE support    Time  4    Period  Weeks    Status  New    Target Date  12/11/17      PT SHORT TERM GOAL #2   Title  Patient will be able to stand at counter for 15 minutes to be able to complete a standing activity    Baseline  Patient has limited standing time with UE support and AD    Time  4    Period  Weeks    Status  New    Target Date  12/11/17        PT Long Term Goals - 05/28/18 1739      PT LONG TERM GOAL #1   Title  Patient will be independent in home exercise program to improve strength/mobility for better functional independence with ADLs    Baseline  stretching and strengthening    Time  8    Period  Weeks    Status  On-going    Target Date  06/21/18      PT LONG TERM GOAL #2   Title  Patient will ascend/descend 4 stairs with rail assist independently and ability to bring his rollator to the top or bottom of the steps , without loss of balance to improve ability to get in/out of home    Baseline  Patient is able to ascend and descend steps with railing and CGA    Time  8    Period  Weeks    Status  On-going    Target Date  06/21/18      PT LONG TERM GOAL #3   Title  Patient will increase 10 meter walk test to >1.42m/s as to improve gait speed for better community ambulation and to reduce fall risk.    Baseline  1.31 m/sec ;01/15/18= 1.31 m/sec, 02/20/18 1.31 m/sec 03/20/18=1.31    Time  8    Period  Weeks    Status  Achieved  Target Date  06/21/18      PT LONG TERM GOAL #4   Title  Patient (> 56 years old) will complete five times sit to stand test in < 15 seconds indicating an increased LE strength and improved balance.    Baseline  18.22 sec,  01/15/18=  18.22, 02/20/17 =18.22 sec 03/20/18 = 14.83 sec    Time  8    Period  Weeks    Status  Achieved    Target Date  06/21/18      PT LONG TERM GOAL #5   Title  Patient will ambulate 50 feet indoors with spc without falls or using  the furniture for support.    Baseline  spc 75 % of the time indoors    Time  8    Period  Weeks    Status  Achieved    Target Date  06/21/18            Plan - 06/06/18 1618    Clinical Impression Statement Patient's condition has the potential to improve in response to therapy. Maximum improvement is yet to be obtained. The anticipated improvement is attainable and reasonable in a generally predictable time. Start date of reporting period  04/10/18 end date of reporting period 06/06/18 . Patient reports that his foot discomfort is slowing him down and he fell today in his kitchen. Patient demonstrates improved stability and strength allowing patient to perform short duration standing interventions with rest periods.  Patient performs beginning gait training balance activities  To encourage  shifting weight and performing standing activities with min assist. Patient fatigues quickly with exercises requiring rest breaks at this time. Patient will continue to benefit from skilled physical therapy to improve strength and mobility.    Rehab Potential  Good    Clinical Impairments Affecting Rehab Potential  chronicity of condition, Parkinson's disease, TKR BLE    PT Frequency  2x / week    PT Duration  8 weeks    PT Treatment/Interventions  Neuromuscular re-education;Manual techniques;Therapeutic exercise;Therapeutic activities;Aquatic Therapy;Gait training;Patient/family education;Cryotherapy;Stair training;Balance training;Dry needling;Taping    PT Next Visit Plan  Knee extension ROM, gait, functional strengthening, balance training    PT Home Exercise Plan  Update HEP next visit.     Consulted and Agree with Plan of Care  Patient       Patient will benefit from skilled therapeutic intervention in order to improve the following deficits and impairments:  Pain, Postural dysfunction, Improper body mechanics, Difficulty walking, Decreased strength, Decreased range of motion, Abnormal gait,  Decreased balance, Decreased endurance, Decreased mobility, Impaired sensation, Decreased activity tolerance, Impaired flexibility  Visit Diagnosis: Difficulty in walking, not elsewhere classified  Muscle weakness (generalized)  Chronic pain of right knee     Problem List Patient Active Problem List   Diagnosis Date Noted  . Moderate recurrent major depression ([place]) 08/15/2016  . Chest pain due to myocardial ischemia 08/14/2016  . Chest pain, rule out acute myocardial infarction 08/14/2016    Alanson Puls, [place] DPT 06/06/2018, 4:20 PM  [place] MAIN [facility] SERVICES [address] [place], [place], [address] Phone: [phone]   Fax:  [phone]  Name: Charles Choi MRN: [id number] Date of Birth: 11-16-1950

## 2018-06-12 ENCOUNTER — Ambulatory Visit: Payer: Medicare Other | Attending: Internal Medicine | Admitting: Physical Therapy

## 2018-06-12 ENCOUNTER — Encounter: Payer: Self-pay | Admitting: Physical Therapy

## 2018-06-12 DIAGNOSIS — G8929 Other chronic pain: Secondary | ICD-10-CM | POA: Diagnosis present

## 2018-06-12 DIAGNOSIS — R262 Difficulty in walking, not elsewhere classified: Secondary | ICD-10-CM | POA: Diagnosis not present

## 2018-06-12 DIAGNOSIS — M25561 Pain in right knee: Secondary | ICD-10-CM | POA: Diagnosis present

## 2018-06-12 DIAGNOSIS — M6281 Muscle weakness (generalized): Secondary | ICD-10-CM | POA: Diagnosis present

## 2018-06-12 NOTE — Therapy (Signed)
[place] MAIN [facility] SERVICES [address] [place], [place], [address] Phone: [phone]   Fax:  [phone]  Physical Therapy Treatment  Patient Details  Name: Charles Choi MRN: [id number] Date of Birth: 01-24-1951 Referring Provider: Fulton Reek    Encounter Date: 06/12/2018  PT End of Session - 06/12/18 1524    Visit Number  40    Number of Visits  51    Date for PT Re-Evaluation  06/21/18    Authorization Type  Medicare     Authorization Time Period  04/25/18-06/21/18    PT Start Time  0225    PT Stop Time  0305    PT Time Calculation (min)  40 min    Equipment Utilized During Treatment  Gait belt    Activity Tolerance  Patient limited by fatigue;No increased pain    Behavior During Therapy  WFL for tasks assessed/performed       Past Medical History:  Diagnosis Date  . Anxiety   . Cancer ([place])   . Depression   . Hypertension   . Parkinson's disease [facility])     Past Surgical History:  Procedure Laterality Date  . BACK SURGERY    . CHOLECYSTECTOMY    . DEEP BRAIN STIMULATOR PLACEMENT     for Parkinson's disease  . JOINT REPLACEMENT     left knee x2    There were no vitals filed for this visit.  Subjective Assessment - 06/12/18 1521    Subjective  Patient is feeling better today. having right foot pain.     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  Had PT at [facility] for 7 days.  Used the bike 6-10 minutes, as well as worked on walking, ROM for his R knee.  Started walking with a rw and is currently using a rollator walker.  Pt also adds that he was in a wc since October 2017 until the surgery due to pain. Patient has been on antiobiotic now due to the left knee being infected. The MD will test him in april to see if he continues to have an infection and if he needs to have any more surgery on the left knee. Patients baseline for ambulation is that he was able to  ambulate with a spc outdoors. He had PT in July for strengthening to be able to walk with a cane instead of the rollator.     Limitations  Walking;Standing    How long can you sit comfortably?  Not limited.     How long can you stand comfortably?  60-90 seconds.     How long can you walk comfortably?  15-30 minutes, limited by Right foot pain which has declined since starting PT.     Patient Stated Goals  I want to walk without a walker, improve endurance.    Currently in Pain?  Yes    Pain Score  2     Pain Location  Foot    Pain Orientation  Right    Pain Descriptors / Indicators  Aching    Pain Type  Chronic pain    Pain Onset  More than a month ago    Pain Frequency  Constant    Aggravating Factors   walking    Pain Relieving Factors  NA    Effect of Pain on Daily Activities  difficult to walk long distnaces  Therapeutic exercise and neuromuscular training:  Bosulunge alternating LE without UE support x 10 BOSULunge from foamalternating LE without UE support x 10  Airex stance trunk rotation with physio ball: 1x10 bilateral Toe tapping 6 inch stool without UE assist, cues for technique  1/2 foam flat side up and balance with head turns left and right feet apart and feet together,cues for better posture Side stepping on blue balance beam left and right x 10 lengths, cues for going slowly and she needs UE support with LOB backwards Tilt board fwd/bwd, side to side left and rightx 2 minsneeds UE support Stepping overhurdleleft and right and fwd/bwd x 20, needs UE support with LOB backwards 3 way hip with YTB x 15 BLE, UE support   CGA and Min to mod verbal cues used throughout with increased in postural sway and LOB most seen with narrow base of support and while on uneven surfaces. Continues to have balance deficits typical with diagnosis                         PT Education - 06/12/18 1523    Education provided  Yes    Education  Details  HEP    Person(s) Educated  Patient    Methods  Explanation    Comprehension  Verbalized understanding       PT Short Term Goals - 11/14/17 0848      PT SHORT TERM GOAL #1   Title  Pt will be able to perform sit <> stand transfer from a regular chair independently without UE assist to promote function.     Baseline  needs UE support    Time  4    Period  Weeks    Status  New    Target Date  12/11/17      PT SHORT TERM GOAL #2   Title  Patient will be able to stand at counter for 15 minutes to be able to complete a standing activity    Baseline  Patient has limited standing time with UE support and AD    Time  4    Period  Weeks    Status  New    Target Date  12/11/17        PT Long Term Goals - 05/28/18 1739      PT LONG TERM GOAL #1   Title  Patient will be independent in home exercise program to improve strength/mobility for better functional independence with ADLs    Baseline  stretching and strengthening    Time  8    Period  Weeks    Status  On-going    Target Date  06/21/18      PT LONG TERM GOAL #2   Title  Patient will ascend/descend 4 stairs with rail assist independently and ability to bring his rollator to the top or bottom of the steps , without loss of balance to improve ability to get in/out of home    Baseline  Patient is able to ascend and descend steps with railing and CGA    Time  8    Period  Weeks    Status  On-going    Target Date  06/21/18      PT LONG TERM GOAL #3   Title  Patient will increase 10 meter walk test to >1.43m/s as to improve gait speed for better community ambulation and to reduce fall risk.    Baseline  1.31 m/sec ;01/15/18= 1.31  m/sec, 02/20/18 1.31 m/sec 03/20/18=1.31    Time  8    Period  Weeks    Status  Achieved    Target Date  06/21/18      PT LONG TERM GOAL #4   Title  Patient (> 66 years old) will complete five times sit to stand test in < 15 seconds indicating an increased LE strength and improved balance.     Baseline  18.22 sec,  01/15/18=  18.22, 02/20/17 =18.22 sec 03/20/18 = 14.83 sec    Time  8    Period  Weeks    Status  Achieved    Target Date  06/21/18      PT LONG TERM GOAL #5   Title  Patient will ambulate 50 feet indoors with spc without falls or using the furniture for support.    Baseline  spc 75 % of the time indoors    Time  8    Period  Weeks    Status  Achieved    Target Date  06/21/18            Plan - 06/12/18 1525    Clinical Impression Statement  Pt presents with unsteadiness on uneven surfaces and fatigues with therapeutic exercises. Patient needs assist with  moderate balance activities and needs CGA assist with standing activities. Patient demonstrates difficulty with dynamic standing balance and with narrow base of support and increased challenges for LE. Patient tolerated all interventions well this date and skilled PT will continue to improve mobility and strength. He is waiting to have surgery on right foot.    Rehab Potential  Good    Clinical Impairments Affecting Rehab Potential  chronicity of condition, Parkinson's disease, TKR BLE    PT Frequency  2x / week    PT Duration  8 weeks    PT Treatment/Interventions  Neuromuscular re-education;Manual techniques;Therapeutic exercise;Therapeutic activities;Aquatic Therapy;Gait training;Patient/family education;Cryotherapy;Stair training;Balance training;Dry needling;Taping    PT Next Visit Plan  Knee extension ROM, gait, functional strengthening, balance training    PT Home Exercise Plan  Update HEP next visit.     Consulted and Agree with Plan of Care  Patient       Patient will benefit from skilled therapeutic intervention in order to improve the following deficits and impairments:  Pain, Postural dysfunction, Improper body mechanics, Difficulty walking, Decreased strength, Decreased range of motion, Abnormal gait, Decreased balance, Decreased endurance, Decreased mobility, Impaired sensation, Decreased activity  tolerance, Impaired flexibility  Visit Diagnosis: Difficulty in walking, not elsewhere classified  Muscle weakness (generalized)  Chronic pain of right knee     Problem List Patient Active Problem List   Diagnosis Date Noted  . Moderate recurrent major depression ([place]) 08/15/2016  . Chest pain due to myocardial ischemia 08/14/2016  . Chest pain, rule out acute myocardial infarction 08/14/2016    Alanson Puls, PT DPT 06/12/2018, 3:26 PM  [place] MAIN [facility] SERVICES [address] [place], [place], [address] Phone: [phone]   Fax:  [phone]  Name: Charles Choi MRN: [id number] Date of Birth: 1951-03-31

## 2018-06-13 ENCOUNTER — Ambulatory Visit (INDEPENDENT_AMBULATORY_CARE_PROVIDER_SITE_OTHER): Payer: Medicare Other | Admitting: Urology

## 2018-06-13 ENCOUNTER — Encounter: Payer: Self-pay | Admitting: Urology

## 2018-06-13 VITALS — BP 137/75 | HR 101 | Ht 69.0 in | Wt 179.0 lb

## 2018-06-13 DIAGNOSIS — G20 Parkinson's disease: Secondary | ICD-10-CM

## 2018-06-13 DIAGNOSIS — E291 Testicular hypofunction: Secondary | ICD-10-CM

## 2018-06-13 DIAGNOSIS — F32A Depression, unspecified: Secondary | ICD-10-CM | POA: Insufficient documentation

## 2018-06-13 DIAGNOSIS — F329 Major depressive disorder, single episode, unspecified: Secondary | ICD-10-CM | POA: Insufficient documentation

## 2018-06-13 DIAGNOSIS — C6290 Malignant neoplasm of unspecified testis, unspecified whether descended or undescended: Secondary | ICD-10-CM | POA: Insufficient documentation

## 2018-06-13 DIAGNOSIS — I10 Essential (primary) hypertension: Secondary | ICD-10-CM | POA: Insufficient documentation

## 2018-06-13 DIAGNOSIS — N5201 Erectile dysfunction due to arterial insufficiency: Secondary | ICD-10-CM

## 2018-06-13 DIAGNOSIS — E785 Hyperlipidemia, unspecified: Secondary | ICD-10-CM | POA: Insufficient documentation

## 2018-06-13 DIAGNOSIS — F419 Anxiety disorder, unspecified: Secondary | ICD-10-CM | POA: Insufficient documentation

## 2018-06-14 ENCOUNTER — Encounter: Payer: Self-pay | Admitting: Physical Therapy

## 2018-06-14 ENCOUNTER — Ambulatory Visit: Payer: Medicare Other | Admitting: Physical Therapy

## 2018-06-14 DIAGNOSIS — G8929 Other chronic pain: Secondary | ICD-10-CM

## 2018-06-14 DIAGNOSIS — M6281 Muscle weakness (generalized): Secondary | ICD-10-CM

## 2018-06-14 DIAGNOSIS — M25561 Pain in right knee: Secondary | ICD-10-CM

## 2018-06-14 DIAGNOSIS — R262 Difficulty in walking, not elsewhere classified: Secondary | ICD-10-CM | POA: Diagnosis not present

## 2018-06-14 NOTE — Therapy (Signed)
[facility] MAIN [facility] SERVICES [address] [place], [place], [address] Phone: [phone]   Fax:  [phone]  Physical Therapy Treatment  Patient Details  Name: Charles Choi MRN: [id number] Date of Birth: 02-May-1951 Referring Provider: Fulton Reek    Encounter Date: 06/14/2018  PT End of Session - 06/14/18 1409    Visit Number  41    Number of Visits  51    Date for PT Re-Evaluation  06/21/18    Authorization Type  Medicare     Authorization Time Period  04/25/18-06/21/18    PT Start Time  0150    PT Stop Time  0230    PT Time Calculation (min)  40 min    Equipment Utilized During Treatment  Gait belt    Activity Tolerance  Patient limited by fatigue;No increased pain    Behavior During Therapy  WFL for tasks assessed/performed       Past Medical History:  Diagnosis Date  . Anxiety   . Cancer ([place])   . Depression   . Hypertension   . Parkinson's disease [facility])     Past Surgical History:  Procedure Laterality Date  . BACK SURGERY    . CHOLECYSTECTOMY    . DEEP BRAIN STIMULATOR PLACEMENT     for Parkinson's disease  . JOINT REPLACEMENT     left knee x2  . ORCHIECTOMY  1983    There were no vitals filed for this visit.  Subjective Assessment - 06/14/18 1408    Subjective  Patient is feeling better today. having right foot pain.     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  Had PT at [facility] for 7 days.  Used the bike 6-10 minutes, as well as worked on walking, ROM for his R knee.  Started walking with a rw and is currently using a rollator walker.  Pt also adds that he was in a wc since October 2017 until the surgery due to pain. Patient has been on antiobiotic now due to the left knee being infected. The MD will test him in april to see if he continues to have an infection and if he needs to have any more surgery on the left knee. Patients baseline for ambulation is  that he was able to ambulate with a spc outdoors. He had PT in July for strengthening to be able to walk with a cane instead of the rollator.     Limitations  Walking;Standing    How long can you sit comfortably?  Not limited.     How long can you stand comfortably?  60-90 seconds.     How long can you walk comfortably?  15-30 minutes, limited by Right foot pain which has declined since starting PT.     Patient Stated Goals  I want to walk without a walker, improve endurance.    Currently in Pain?  Yes    Pain Score  2     Pain Onset  More than a month ago         NEUROMUSCULAR RE-EDUCATION  Stepping fwd and bwd without ue support and weight shift over stance leg x 10 BLE marching in parallel bars x 20 SLR with intervals 30 deg , 60 deg x 15 BLE hooklying hip ER/ abd with TA x 40 with RTB  sidelying hip abd x 15 BLE sidelying hip flex/ ext x 15 BLE Quad set  LLE x 5 sec hold x 10 SAQ small bolster x 10 with 5 sec hold Standing with knee extensio with GTB and 5 sec Hamstring stretch BLE, calf stretch BLE  Hip flexor stretch BLE 30 sec    Patient has decreased standing tolerance due to right foot pain and fatigue in standing position.                       PT Education - 06/14/18 1408    Education provided  Yes    Education Details  HEP    Person(s) Educated  Patient    Methods  Explanation;Demonstration    Comprehension  Verbalized understanding;Returned demonstration       PT Short Term Goals - 11/14/17 0848      PT SHORT TERM GOAL #1   Title  Pt will be able to perform sit <> stand transfer from a regular chair independently without UE assist to promote function.     Baseline  needs UE support    Time  4    Period  Weeks    Status  New    Target Date  12/11/17      PT SHORT TERM GOAL #2   Title  Patient will be able to stand at counter for 15 minutes to be able to complete a standing activity    Baseline  Patient has limited standing time with  UE support and AD    Time  4    Period  Weeks    Status  New    Target Date  12/11/17        PT Long Term Goals - 05/28/18 1739      PT LONG TERM GOAL #1   Title  Patient will be independent in home exercise program to improve strength/mobility for better functional independence with ADLs    Baseline  stretching and strengthening    Time  8    Period  Weeks    Status  On-going    Target Date  06/21/18      PT LONG TERM GOAL #2   Title  Patient will ascend/descend 4 stairs with rail assist independently and ability to bring his rollator to the top or bottom of the steps , without loss of balance to improve ability to get in/out of home    Baseline  Patient is able to ascend and descend steps with railing and CGA    Time  8    Period  Weeks    Status  On-going    Target Date  06/21/18      PT LONG TERM GOAL #3   Title  Patient will increase 10 meter walk test to >1.37m/s as to improve gait speed for better community ambulation and to reduce fall risk.    Baseline  1.31 m/sec ;01/15/18= 1.31 m/sec, 02/20/18 1.31 m/sec 03/20/18=1.31    Time  8    Period  Weeks    Status  Achieved    Target Date  06/21/18      PT LONG TERM GOAL #4   Title  Patient (> 66 years old) will complete five times sit to stand test in < 15 seconds indicating an increased LE strength and improved balance.    Baseline  18.22 sec,  01/15/18=  18.22, 02/20/17 =18.22 sec 03/20/18 = 14.83 sec    Time  8    Period  Weeks    Status  Achieved    Target Date  06/21/18      PT LONG TERM GOAL #5   Title  Patient will ambulate 50 feet indoors with spc without falls or using the furniture for support.    Baseline  spc 75 % of the time indoors    Time  8    Period  Weeks    Status  Achieved    Target Date  06/21/18            Plan - 06/14/18 1410    Clinical Impression Statement  Pt requires direction and verbal cues for correct performance of exercises. Patient demonstrates weakness in BLE and performs open  and closed chain exercises with minimal reports of pain to left knee. Pt was able to perform all exercises with max assist and VC for technique..Discussed left knee lag in extension during standing and ways to strengthen it. Patient struggles with speed during movement as well as balance with unstable surfaces.  Pt encouraged continuing new supine HEP .Follow-up as scheduled.    Rehab Potential  Good    Clinical Impairments Affecting Rehab Potential  chronicity of condition, Parkinson's disease, TKR BLE    PT Frequency  2x / week    PT Duration  8 weeks    PT Treatment/Interventions  Neuromuscular re-education;Manual techniques;Therapeutic exercise;Therapeutic activities;Aquatic Therapy;Gait training;Patient/family education;Cryotherapy;Stair training;Balance training;Dry needling;Taping    PT Next Visit Plan  Knee extension ROM, gait, functional strengthening, balance training    PT Home Exercise Plan  Update HEP next visit.     Consulted and Agree with Plan of Care  Patient       Patient will benefit from skilled therapeutic intervention in order to improve the following deficits and impairments:  Pain, Postural dysfunction, Improper body mechanics, Difficulty walking, Decreased strength, Decreased range of motion, Abnormal gait, Decreased balance, Decreased endurance, Decreased mobility, Impaired sensation, Decreased activity tolerance, Impaired flexibility  Visit Diagnosis: Difficulty in walking, not elsewhere classified  Muscle weakness (generalized)  Chronic pain of right knee     Problem List Patient Active Problem List   Diagnosis Date Noted  . Testicular cancer ([place]) 06/13/2018  . Hypertension 06/13/2018  . Hyperlipidemia 06/13/2018  . Depression 06/13/2018  . Anxiety 06/13/2018  . Callus of foot 03/27/2018  . Pes planovalgus, acquired, right 01/30/2018  . Arthritis of midfoot 01/30/2018  . Achilles tendon contracture, right 01/30/2018  . Left shoulder pain 07/26/2017  .  Infection of prosthetic left knee joint ([place]) 07/10/2017  . 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[place]) 05/15/2017  . GERD (gastroesophageal reflux disease) 03/20/2017  . Primary osteoarthritis of one knee, right 12/30/2016  . H/O total knee replacement, left 12/30/2016  . Moderate recurrent major depression ([place]) 08/15/2016  . Chest pain due to myocardial ischemia 08/14/2016  . Chest pain, rule out acute myocardial infarction 08/14/2016  . Chest x-ray abnormality 10/17/2013  . Scoliosis 07/11/2012  . Arthritis of wrist, left, degenerative 07/11/2012  . Parkinson's disease ([place]) 01/07/2012    Alanson Puls PT DPT 06/14/2018, 2:12 PM  [place] MAIN [facility] SERVICES [address] [place], [place], [address] Phone: [phone]   Fax:  [phone]  Name: Charles Choi MRN: [id number] Date of Birth: [birth year]

## 2018-06-15 NOTE — Progress Notes (Signed)
06/13/2018 7:17 AM   Charles Choi 06-28-1951 [id number]  Referring provider: Idelle Crouch, MD [facility] [place], [place] [address]  Chief Complaint  Patient presents with  . Hypogonadism    HPI: 67 year old male presents for evaluation of hypogonadism.  A testosterone level drawn on 05/03/2018 was low at 218 ng/dL. PSA was stable at 2.45.  His symptoms include tiredness, fatigue and decreased libido.  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  He has previously been treated for erectile dysfunction with tadalafil starting in 2018.  He states this medication has been effective.  He presented to the ED on 05/10/2018 complaining of severe right lower quadrant pain associated with nausea.  A CT of the abdomen pelvis showed a 4 mm right distal ureteral calculus near the UVJ.  No renal calculi were seen on CT.  He subsequently passed the stone with resolution of his symptoms.  There is no previous history of stone disease.  Serum calcium level was normal.  He has Parkinson's disease and takes tamsulosin for lower urinary tract symptoms.   PMH: Past Medical History:  Diagnosis Date  . Anxiety   . Cancer ([place])   . Depression   . Hypertension   . Parkinson's disease [facility])     Surgical History: Past Surgical History:  Procedure Laterality Date  . BACK SURGERY    . CHOLECYSTECTOMY    . DEEP BRAIN STIMULATOR PLACEMENT     for Parkinson's disease  . JOINT REPLACEMENT     left knee x2  . ORCHIECTOMY  1983    Home Medications:  Allergies as of 06/13/2018      Reactions   Phenergan [promethazine Hcl] Other (See Comments)   Unknown    Reglan [metoclopramide] Other (See Comments)   Pass out       Medication List        Accurate as of 06/13/18 11:59 PM. Always use your most recent med list.          amantadine 100 MG capsule Commonly known as:  SYMMETREL Take 100 mg by mouth daily.   BABY ASPIRIN  PO Take 81 mg by mouth.   carbidopa-levodopa 25-100 MG tablet Commonly known as:  SINEMET IR Take 0.5-1 tablets by mouth See admin instructions. Take 1 tablet orally two times a day at 7am, and 10pm. Take 0.5 tablet orally at 10am, 1pm, 4pm, and 7pm.   HYDROcodone-acetaminophen 5-325 MG tablet Commonly known as:  NORCO/VICODIN Take 1 tablet by mouth every 6 (six) hours as needed for up to 15 doses for severe pain.   ibuprofen 600 MG tablet Commonly known as:  ADVIL,MOTRIN Take 1 tablet (600 mg total) by mouth every 8 (eight) hours as needed.   losartan 50 MG tablet Commonly known as:  COZAAR Take 50 mg by mouth daily.   Melatonin 3 MG Caps Take by mouth.   meloxicam 7.5 MG tablet Commonly known as:  MOBIC Take 7.5 mg by mouth daily.   omeprazole 20 MG tablet Commonly known as:  PRILOSEC OTC Take 20 mg by mouth 2 (two) times daily.   ondansetron 4 MG disintegrating tablet Commonly known as:  ZOFRAN-ODT Take 1 tablet (4 mg total) by mouth every 8 (eight) hours as needed for nausea or vomiting.   rOPINIRole 2 MG tablet Commonly known as:  REQUIP Take 2 mg by mouth 6 (six) times daily. At 7am, and 1pm.   selegiline 5  MG capsule Commonly known as:  ELDEPRYL Take 5 mg by mouth 2 (two) times daily. At 7am and 1pm.   tamsulosin 0.4 MG Caps capsule Commonly known as:  FLOMAX Take 1 capsule (0.4 mg total) by mouth daily.   traZODone 150 MG tablet Commonly known as:  DESYREL Take 100 mg by mouth at bedtime.       Allergies:  Allergies  Allergen Reactions  . Phenergan [Promethazine Hcl] Other (See Comments)    Unknown   . Reglan [Metoclopramide] Other (See Comments)    Pass out     Family History: History reviewed. No pertinent family history.  Social History:  reports that he has never smoked. He has never used smokeless tobacco. He reports that he drinks alcohol. He reports that he has current or past drug history. Drug: Marijuana.  ROS: UROLOGY Frequent  Urination?: No Hard to postpone urination?: No Burning/pain with urination?: No Get up at night to urinate?: Yes Leakage of urine?: No Urine stream starts and stops?: No Trouble starting stream?: No Do you have to strain to urinate?: No Blood in urine?: No Urinary tract infection?: No Sexually transmitted disease?: No Injury to kidneys or bladder?: No Painful intercourse?: No Weak stream?: No Erection problems?: Yes Penile pain?: No  Gastrointestinal Nausea?: No Vomiting?: No Indigestion/heartburn?: No Diarrhea?: No Constipation?: No  Constitutional Fever: No Night sweats?: No Weight loss?: No Fatigue?: No  Skin Skin rash/lesions?: No Itching?: No  Eyes Blurred vision?: No Double vision?: No  Ears/Nose/Throat Sore throat?: No Sinus problems?: No  Hematologic/Lymphatic Swollen glands?: No Easy bruising?: No  Cardiovascular Leg swelling?: No Chest pain?: No  Respiratory Cough?: No Shortness of breath?: No  Endocrine Excessive thirst?: No  Musculoskeletal Back pain?: No Joint pain?: Yes  Neurological Headaches?: No Dizziness?: No  Psychologic Depression?: Yes Anxiety?: Yes  Physical Exam: BP 137/75   Pulse (!) 101   Ht 5\' 9"  (1.753 m)   Wt 179 lb (81.2 kg)   BMI 26.43 kg/m   Constitutional:  Alert and oriented, No acute distress. HEENT:  AT, moist mucus membranes.  Trachea midline, no masses. Cardiovascular: No clubbing, cyanosis, or edema. Respiratory: Normal respiratory effort, no increased work of breathing. GI: Abdomen is soft, nontender, nondistended, no abdominal masses GU: No CVA tenderness Lymph: No cervical or inguinal lymphadenopathy. Skin: No rashes, bruises or suspicious lesions. Neurologic: Grossly intact, no focal deficits, moving all 4 extremities. Psychiatric: Normal mood and affect.   Pertinent Imaging: CT reviewed   Assessment & Plan:   1.  Hypogonadism Will repeat an a.m. testosterone level along with an  LH.  He was informed that insurance companies typically will not cover TRT unless he has to abnormal levels.  We discussed various methods of testosterone replacement including injections, topicals and Xyosted.  Potential side effects of testosterone replacement were discussed including stimulation of benign prostatic growth with lower urinary tract symptoms; erythrocytosis; edema; gynecomastia; worsening sleep apnea; venous thromboembolism; testicular atrophy and infertility. Recent studies suggesting an increased incidence of heart attack and stroke in patients taking testosterone was discussed. He was informed there is conflicting evidence regarding the impact of testosterone therapy on cardiovascular risk. The theoretical risk of growth stimulation of an undetected prostate cancer was also discussed.  He was informed that current evidence does not provide any definitive answers regarding the risks of testosterone therapy on prostate cancer and cardiovascular disease. The need for periodic monitoring of his testosterone level, PSA, hematocrit and DRE was discussed.  2. Ureteral calculus/renal  colic Symptoms have resolved since passing the stone.  He brought in the stent today and will be sent for analysis.  General stone prevention guidelines were discussed as this is his first episode.  Recommend KUB 6 months.   Abbie Sons, [place] [address] [place], [place] [address] [phone]

## 2018-06-18 ENCOUNTER — Ambulatory Visit: Payer: Medicare Other | Admitting: Physical Therapy

## 2018-06-18 ENCOUNTER — Encounter: Payer: Self-pay | Admitting: Physical Therapy

## 2018-06-18 ENCOUNTER — Encounter: Payer: Self-pay | Admitting: Urology

## 2018-06-18 DIAGNOSIS — R262 Difficulty in walking, not elsewhere classified: Secondary | ICD-10-CM

## 2018-06-18 DIAGNOSIS — G8929 Other chronic pain: Secondary | ICD-10-CM

## 2018-06-18 DIAGNOSIS — M25561 Pain in right knee: Secondary | ICD-10-CM

## 2018-06-18 DIAGNOSIS — M6281 Muscle weakness (generalized): Secondary | ICD-10-CM

## 2018-06-18 DIAGNOSIS — E291 Testicular hypofunction: Secondary | ICD-10-CM | POA: Insufficient documentation

## 2018-06-18 NOTE — Therapy (Signed)
[place] MAIN [facility] SERVICES [address] [place], [place], [address] Phone: [phone]   Fax:  [phone]  Physical Therapy Treatment  Patient Details  Name: Charles Choi MRN: [id number] Date of Birth: 1951/10/06 Referring Provider: Fulton Reek    Encounter Date: 06/18/2018  PT End of Session - 06/18/18 1521    Visit Number  42    Number of Visits  51    Date for PT Re-Evaluation  06/21/18    Authorization Type  Medicare     Authorization Time Period  04/25/18-06/21/18    PT Start Time  0315    PT Stop Time  0400    PT Time Calculation (min)  45 min    Equipment Utilized During Treatment  Gait belt    Activity Tolerance  Patient limited by fatigue;No increased pain    Behavior During Therapy  WFL for tasks assessed/performed       Past Medical History:  Diagnosis Date  . Anxiety   . Cancer ([place])   . Depression   . Hypertension   . Parkinson's disease [facility])     Past Surgical History:  Procedure Laterality Date  . BACK SURGERY    . CHOLECYSTECTOMY    . DEEP BRAIN STIMULATOR PLACEMENT     for Parkinson's disease  . JOINT REPLACEMENT     left knee x2  . ORCHIECTOMY  1983    There were no vitals filed for this visit.  Subjective Assessment - 06/18/18 1521    Subjective  Patient is feeling better today. having right foot pain.     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  Had PT at [facility] for 7 days.  Used the bike 6-10 minutes, as well as worked on walking, ROM for his R knee.  Started walking with a rw and is currently using a rollator walker.  Pt also adds that he was in a wc since October 2017 until the surgery due to pain. Patient has been on antiobiotic now due to the left knee being infected. The MD will test him in april to see if he continues to have an infection and if he needs to have any more surgery on the left knee. Patients baseline for ambulation is  that he was able to ambulate with a spc outdoors. He had PT in July for strengthening to be able to walk with a cane instead of the rollator.     Limitations  Walking;Standing    How long can you sit comfortably?  Not limited.     How long can you stand comfortably?  60-90 seconds.     How long can you walk comfortably?  15-30 minutes, limited by Right foot pain which has declined since starting PT.     Patient Stated Goals  I want to walk without a walker, improve endurance.    Currently in Pain?  Yes    Pain Score  2     Pain Location  Foot    Pain Orientation  Right    Pain Descriptors / Indicators  Aching    Pain Type  Chronic pain    Pain Onset  More than a month ago        NEUROMUSCULAR RE-EDUCATION nu-step x 5 mins  Stepping fwd and bwd without ue support and weight shift over stance leg x 10 BLE SLR with intervals 30 deg , 60 deg x  15 BLE hooklying hip ER/ abd with TA x 40 with RTB sidelying hip abd x 15 BLE sidelying hip flex/ ext x 15 BLE Quad set LLE x 5 sec hold x 10 SAQ small bolster x 10 with 5 sec hold Standing with knee extensio with GTB and 5 sec Hamstring stretch BLE supine 30 sec x 3 sets left and right  calf stretch BLE x 30 sec x 3  Hip flexor stretch BLE 30 sec  Roller stick to hip abd left x 5 mins  Patient needs cues for correct form and technique.     Patient tolerates stretching and strengthening in closed chain and open chain positions with no increased pain following treatment.                     PT Education - 06/18/18 1521    Education provided  Yes    Education Details  HEP    Person(s) Educated  Patient    Methods  Explanation;Demonstration;Tactile cues    Comprehension  Returned demonstration;Verbalized understanding       PT Short Term Goals - 11/14/17 0848      PT SHORT TERM GOAL #1   Title  Pt will be able to perform sit <> stand transfer from a regular chair independently without UE assist to promote function.      Baseline  needs UE support    Time  4    Period  Weeks    Status  New    Target Date  12/11/17      PT SHORT TERM GOAL #2   Title  Patient will be able to stand at counter for 15 minutes to be able to complete a standing activity    Baseline  Patient has limited standing time with UE support and AD    Time  4    Period  Weeks    Status  New    Target Date  12/11/17        PT Long Term Goals - 05/28/18 1739      PT LONG TERM GOAL #1   Title  Patient will be independent in home exercise program to improve strength/mobility for better functional independence with ADLs    Baseline  stretching and strengthening    Time  8    Period  Weeks    Status  On-going    Target Date  06/21/18      PT LONG TERM GOAL #2   Title  Patient will ascend/descend 4 stairs with rail assist independently and ability to bring his rollator to the top or bottom of the steps , without loss of balance to improve ability to get in/out of home    Baseline  Patient is able to ascend and descend steps with railing and CGA    Time  8    Period  Weeks    Status  On-going    Target Date  06/21/18      PT LONG TERM GOAL #3   Title  Patient will increase 10 meter walk test to >1.84m/s as to improve gait speed for better community ambulation and to reduce fall risk.    Baseline  1.31 m/sec ;01/15/18= 1.31 m/sec, 02/20/18 1.31 m/sec 03/20/18=1.31    Time  8    Period  Weeks    Status  Achieved    Target Date  06/21/18      PT LONG TERM GOAL #4   Title  Patient (>  50 years old) will complete five times sit to stand test in < 15 seconds indicating an increased LE strength and improved balance.    Baseline  18.22 sec,  01/15/18=  18.22, 02/20/17 =18.22 sec 03/20/18 = 14.83 sec    Time  8    Period  Weeks    Status  Achieved    Target Date  06/21/18      PT LONG TERM GOAL #5   Title  Patient will ambulate 50 feet indoors with spc without falls or using the furniture for support.    Baseline  spc 75 % of the  time indoors    Time  8    Period  Weeks    Status  Achieved    Target Date  06/21/18            Plan - 06/18/18 1522    Clinical Impression Statement  Pt was able to progress through exercises today with decreases in loss of dynamic standing during reaching activities.  Pt continues to demonstrate improvement with dynamic and static balance, with decreased LOB noted with dynamic activities on even and uneven surfaces.  Pt has decreased strength and single leg stability.  Pt would continue to benefit from skilled therapy services in order to continue strengthening LE's and improving dynamic and static balance.    Rehab Potential  Good    Clinical Impairments Affecting Rehab Potential  chronicity of condition, Parkinson's disease, TKR BLE    PT Frequency  2x / week    PT Duration  8 weeks    PT Treatment/Interventions  Neuromuscular re-education;Manual techniques;Therapeutic exercise;Therapeutic activities;Aquatic Therapy;Gait training;Patient/family education;Cryotherapy;Stair training;Balance training;Dry needling;Taping    PT Next Visit Plan  Knee extension ROM, gait, functional strengthening, balance training    PT Home Exercise Plan  Update HEP next visit.     Consulted and Agree with Plan of Care  Patient       Patient will benefit from skilled therapeutic intervention in order to improve the following deficits and impairments:  Pain, Postural dysfunction, Improper body mechanics, Difficulty walking, Decreased strength, Decreased range of motion, Abnormal gait, Decreased balance, Decreased endurance, Decreased mobility, Impaired sensation, Decreased activity tolerance, Impaired flexibility  Visit Diagnosis: Difficulty in walking, not elsewhere classified  Muscle weakness (generalized)  Chronic pain of right knee     Problem List Patient Active Problem List   Diagnosis Date Noted  . Hypogonadism in male 06/18/2018  . Testicular cancer ([place]) 06/13/2018  . Hypertension  06/13/2018  . Hyperlipidemia 06/13/2018  . Depression 06/13/2018  . Anxiety 06/13/2018  . Callus of foot 03/27/2018  . Pes planovalgus, acquired, right 01/30/2018  . Arthritis of midfoot 01/30/2018  . Achilles tendon contracture, right 01/30/2018  . Left shoulder pain 07/26/2017  . Infection of prosthetic left knee joint ([place]) 07/10/2017  . 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[place]) 05/15/2017  . GERD (gastroesophageal reflux disease) 03/20/2017  . Primary osteoarthritis of one knee, right 12/30/2016  . H/O total knee replacement, left 12/30/2016  . Moderate recurrent major depression ([place]) 08/15/2016  . Chest pain due to myocardial ischemia 08/14/2016  . Chest pain, rule out acute myocardial infarction 08/14/2016  . Chest x-ray abnormality 10/17/2013  . Scoliosis 07/11/2012  . Arthritis of wrist, left, degenerative 07/11/2012  . Parkinson's disease ([place]) 01/07/2012    Arelia Sneddon SPT DPT 06/18/2018, 3:24 PM  [place] MAIN [facility] SERVICES [address] [place], [place], [address] Phone: [phone]   Fax:  [phone]  Name: Charles Choi MRN: [id number] Date of Birth: August 06, 1951

## 2018-06-19 ENCOUNTER — Other Ambulatory Visit: Payer: Medicare Other

## 2018-06-19 ENCOUNTER — Other Ambulatory Visit: Payer: Self-pay

## 2018-06-19 DIAGNOSIS — N400 Enlarged prostate without lower urinary tract symptoms: Secondary | ICD-10-CM

## 2018-06-19 DIAGNOSIS — E291 Testicular hypofunction: Secondary | ICD-10-CM

## 2018-06-20 ENCOUNTER — Other Ambulatory Visit: Payer: Self-pay | Admitting: Urology

## 2018-06-20 ENCOUNTER — Ambulatory Visit: Payer: Medicare Other

## 2018-06-20 DIAGNOSIS — R262 Difficulty in walking, not elsewhere classified: Secondary | ICD-10-CM | POA: Diagnosis not present

## 2018-06-20 DIAGNOSIS — M6281 Muscle weakness (generalized): Secondary | ICD-10-CM

## 2018-06-20 LAB — HEMATOCRIT: Hematocrit: 40.7 % (ref 37.5–51.0)

## 2018-06-20 LAB — PSA: Prostate Specific Ag, Serum: 2.1 ng/mL (ref 0.0–4.0)

## 2018-06-20 LAB — TESTOSTERONE: TESTOSTERONE: 193 ng/dL — AB (ref 264–916)

## 2018-06-20 NOTE — Therapy (Signed)
[place] MAIN [facility] SERVICES [address] [place], [place], [address] Phone: [phone]   Fax:  [phone]  Physical Therapy Treatment  Patient Details  Name: Charles Choi MRN: [id number] Date of Birth: 22-Jun-1951 Referring Provider: Fulton Reek    Encounter Date: 06/20/2018  PT End of Session - 06/20/18 1719    Visit Number  43    Number of Visits  51    Date for PT Re-Evaluation  06/21/18    Authorization Type  Medicare     Authorization Time Period  04/25/18-06/21/18    PT Start Time  1515    PT Stop Time  1558    PT Time Calculation (min)  43 min    Equipment Utilized During Treatment  Gait belt    Activity Tolerance  No increased pain;Patient tolerated treatment well    Behavior During Therapy  [facility] for tasks assessed/performed       Past Medical History:  Diagnosis Date  . Anxiety   . Cancer ([place])   . Depression   . Hypertension   . Parkinson's disease [facility])     Past Surgical History:  Procedure Laterality Date  . BACK SURGERY    . CHOLECYSTECTOMY    . DEEP BRAIN STIMULATOR PLACEMENT     for Parkinson's disease  . JOINT REPLACEMENT     left knee x2  . ORCHIECTOMY  1983    There were no vitals filed for this visit.  Subjective Assessment - 06/20/18 1718    Subjective  Pt reports that he is doing well today. He states that he had 1 fall since his last therapy session. He bruised his R buttock and reports 1/10 pain at this time. No pain bearing weight through RLE. No specific questions or concerns at this time.     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  Had PT at [facility] for 7 days.  Used the bike 6-10 minutes, as well as worked on walking, ROM for his R knee.  Started walking with a rw and is currently using a rollator walker.  Pt also adds that he was in a wc since October 2017 until the surgery due to pain. Patient has been on antiobiotic now due to the  left knee being infected. The MD will test him in april to see if he continues to have an infection and if he needs to have any more surgery on the left knee. Patients baseline for ambulation is that he was able to ambulate with a spc outdoors. He had PT in July for strengthening to be able to walk with a cane instead of the rollator.     Limitations  Walking;Standing    How long can you sit comfortably?  Not limited.     How long can you stand comfortably?  60-90 seconds.     How long can you walk comfortably?  15-30 minutes, limited by Right foot pain which has declined since starting PT.     Patient Stated Goals  I want to walk without a walker, improve endurance.    Currently in Pain?  Yes    Pain Score  1     Pain Location  Buttocks    Pain Orientation  Right    Pain Type  Acute pain    Pain Onset  In the past 7 days    Pain Frequency  Intermittent  TREATMENT  Ther-ex  NuStep L3 x 5 mins for warm-up during history (3 min unbilled); Hamstring stretch BLE 30s x 2; Calf stretch BLE 30s x 2; Single knee to chest stretch BLE 30s x 2; Piriformis stretch BLE 30s x 2; Figure 4 stretch BLE 30s x 2; SLR with intervals 30 deg , 60 deg x 15 BLE Lower abdominal leg lifts in hooklying with mini crunch 3s hold x 15; Hooklying clams with manual resistance 2s hold x 15; Sidelying SLR hip abduction BLE x 15 each, cues to avoid hip flexor substitution; Hooklying bridges 3s hold x 15; Sit to stand from mat table with 4# weighted bar press to shoulder height 2 x 15;  Patient tolerates stretching with no increased pain following treatment and reported improvement in flexibility. Pt requires assistance with stretching as he is unable to perform sufficiently himself.                     PT Education - 06/20/18 1719    Education provided  Yes    Education Details  exercise form/technique    Person(s) Educated  Patient    Methods  Explanation    Comprehension  Verbalized  understanding       PT Short Term Goals - 11/14/17 0848      PT SHORT TERM GOAL #1   Title  Pt will be able to perform sit <> stand transfer from a regular chair independently without UE assist to promote function.     Baseline  needs UE support    Time  4    Period  Weeks    Status  New    Target Date  12/11/17      PT SHORT TERM GOAL #2   Title  Patient will be able to stand at counter for 15 minutes to be able to complete a standing activity    Baseline  Patient has limited standing time with UE support and AD    Time  4    Period  Weeks    Status  New    Target Date  12/11/17        PT Long Term Goals - 05/28/18 1739      PT LONG TERM GOAL #1   Title  Patient will be independent in home exercise program to improve strength/mobility for better functional independence with ADLs    Baseline  stretching and strengthening    Time  8    Period  Weeks    Status  On-going    Target Date  06/21/18      PT LONG TERM GOAL #2   Title  Patient will ascend/descend 4 stairs with rail assist independently and ability to bring his rollator to the top or bottom of the steps , without loss of balance to improve ability to get in/out of home    Baseline  Patient is able to ascend and descend steps with railing and CGA    Time  8    Period  Weeks    Status  On-going    Target Date  06/21/18      PT LONG TERM GOAL #3   Title  Patient will increase 10 meter walk test to >1.32m/s as to improve gait speed for better community ambulation and to reduce fall risk.    Baseline  1.31 m/sec ;01/15/18= 1.31 m/sec, 02/20/18 1.31 m/sec 03/20/18=1.31    Time  8    Period  Weeks  Status  Achieved    Target Date  06/21/18      PT LONG TERM GOAL #4   Title  Patient (> 66 years old) will complete five times sit to stand test in < 15 seconds indicating an increased LE strength and improved balance.    Baseline  18.22 sec,  01/15/18=  18.22, 02/20/17 =18.22 sec 03/20/18 = 14.83 sec    Time  8    Period   Weeks    Status  Achieved    Target Date  06/21/18      PT LONG TERM GOAL #5   Title  Patient will ambulate 50 feet indoors with spc without falls or using the furniture for support.    Baseline  spc 75 % of the time indoors    Time  8    Period  Weeks    Status  Achieved    Target Date  06/21/18            Plan - 06/20/18 1720    Clinical Impression Statement  Pt able to complete all exercises as instructed. He does have notable weakness with sidelying hip abduction and does attempt to compensate with hip flexors. He fatigues with sit to stand with weighted bar press. Pt reports notable improvement in flexibility after stretching. Pt requires assist with stretching as he is unable to complete adequately at home. Pt would continue to benefit from skilled therapy services in order to continue strengthening LE's and improving dynamic and static balance.    Rehab Potential  Good    Clinical Impairments Affecting Rehab Potential  chronicity of condition, Parkinson's disease, TKR BLE    PT Frequency  2x / week    PT Duration  8 weeks    PT Treatment/Interventions  Neuromuscular re-education;Manual techniques;Therapeutic exercise;Therapeutic activities;Aquatic Therapy;Gait training;Patient/family education;Cryotherapy;Stair training;Balance training;Dry needling;Taping    PT Next Visit Plan  Knee extension ROM, gait, functional strengthening, balance training    PT Home Exercise Plan  Update HEP next visit.     Consulted and Agree with Plan of Care  Patient       Patient will benefit from skilled therapeutic intervention in order to improve the following deficits and impairments:  Pain, Postural dysfunction, Improper body mechanics, Difficulty walking, Decreased strength, Decreased range of motion, Abnormal gait, Decreased balance, Decreased endurance, Decreased mobility, Impaired sensation, Decreased activity tolerance, Impaired flexibility  Visit Diagnosis: Difficulty in walking, not  elsewhere classified  Muscle weakness (generalized)     Problem List Patient Active Problem List   Diagnosis Date Noted  . Hypogonadism in male 06/18/2018  . Testicular cancer ([place]) 06/13/2018  . Hypertension 06/13/2018  . Hyperlipidemia 06/13/2018  . Depression 06/13/2018  . Anxiety 06/13/2018  . Callus of foot 03/27/2018  . Pes planovalgus, acquired, right 01/30/2018  . Arthritis of midfoot 01/30/2018  . Achilles tendon contracture, right 01/30/2018  . Left shoulder pain 07/26/2017  . Infection of prosthetic left knee joint ([place]) 07/10/2017  . 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[place]) 05/15/2017  . GERD (gastroesophageal reflux disease) 03/20/2017  . Primary osteoarthritis of one knee, right 12/30/2016  . H/O total knee replacement, left 12/30/2016  . Moderate recurrent major depression ([place]) 08/15/2016  . Chest pain due to myocardial ischemia 08/14/2016  . Chest pain, rule out acute myocardial infarction 08/14/2016  . Chest x-ray abnormality 10/17/2013  . Scoliosis 07/11/2012  . Arthritis of wrist, left, degenerative 07/11/2012  . Parkinson's disease ([place]) 01/07/2012   Phillips Grout PT, DPT,  GCS  Janelle Spellman 06/20/2018, 5:23 PM  [place] MAIN [facility] SERVICES [address] [place], [place], [address] Phone: [phone]   Fax:  [phone]  Name: Charles Choi MRN: [id number] Date of Birth: 04/19/1951

## 2018-06-26 ENCOUNTER — Encounter: Payer: Self-pay | Admitting: Physical Therapy

## 2018-06-26 ENCOUNTER — Telehealth: Payer: Self-pay

## 2018-06-26 ENCOUNTER — Ambulatory Visit: Payer: Medicare Other | Admitting: Physical Therapy

## 2018-06-26 DIAGNOSIS — M6281 Muscle weakness (generalized): Secondary | ICD-10-CM

## 2018-06-26 DIAGNOSIS — R262 Difficulty in walking, not elsewhere classified: Secondary | ICD-10-CM

## 2018-06-26 DIAGNOSIS — M25561 Pain in right knee: Secondary | ICD-10-CM

## 2018-06-26 DIAGNOSIS — G8929 Other chronic pain: Secondary | ICD-10-CM

## 2018-06-26 NOTE — Telephone Encounter (Signed)
-----   Message from Abbie Sons, MD sent at 06/24/2018  9:32 AM EDT ----- Testosterone remains low at 193.  He was supposed to have an [place] drawn.  It was in the orders but not sent.  Please add on if this was not drawn.  Does he have a preference for testosterone replacement gels versus injections?  His stone analysis was also present uric acid and would recommend a metabolic evaluation.

## 2018-06-26 NOTE — Patient Instructions (Signed)
Calf Stretch    With towel around forefoot, keep knee straight and pull back on towel until a stretch is felt in the calf. Hold __30_ seconds. Repeat ___3_ times. Do __2__ sessions per day.  Copyright  VHI. All rights reserved.  HIP: Extension, Bridging Bilateral    Place feet on surface. Tighten glutes, raise hips up. _10__ reps per set, __2_ sets per day, _7__ days per week   Copyright  VHI. All rights reserved.  Hip Extension: 2-4 Inches    Tighten gluteal muscle. Lift one leg __10_ times. Restabilize pelvis. Repeat with other leg. Keep pelvis still. Be sure pelvis does not rotate and back does not arch. Do __2_ sets, ___2 times per day.  http://ss.exer.us/62   Copyright  VHI. All rights reserved.  HIP: Extension / KNEE: Flexion - Prone (Weight)    Place weight around leg. Bend knee, squeeze glutes. Raise leg up. Hold _5__ seconds.. _10__ reps per set, __2_ sets per day, _2__ days per week   Copyright  VHI. All rights reserved.  Hip Abduction: Modified    Lying on right side with pillow between thighs, raise top leg from pillow, rotating slightly out. Repeat __15__ times per set. Do __2__ sets per session. Do __2__ sessions per day.  http://orth.exer.us/704   Copyright  VHI. All rights reserved.  HIP: Abduction / External Rotation (Band)    Place band around knees. Lie on side with hips and knees bent. Raise top knee up, squeezing glutes. Keep feet together. Hold ___ seconds. Use ___red_____ band. __15_ reps per set, 2___ sets per day, _7__ days per week   Copyright  VHI. All rights reserved.  Straight Leg Raise    Tighten stomach and slowly raise locked right leg ___12_ inches from floor. Repeat _15___ times per set. Do _15___ sets per session. Do __2__ sessions per day.  http://orth.exer.us/1102   Copyright  VHI. All rights reserved.  Quad Set    With other leg bent, foot flat, slowly tighten muscles on thigh of straight leg while counting  out loud to ___5_. Repeat with other leg. Repeat __20__ times. Do 2____ sessions per day.  http://gt2.exer.us/275   Copyright  VHI. All rights reserved.

## 2018-06-26 NOTE — Telephone Encounter (Signed)
Called pt no answer. Called [facility] specimen has been disposed of, pt will need to come in for an additional draw

## 2018-06-26 NOTE — Therapy (Addendum)
[place] MAIN [facility] SERVICES [address] [place], [place], [address] Phone: [phone]   Fax:  [phone]  Physical Therapy Treatment  Patient Details  Name: Charles Choi MRN: [id number] Date of Birth: 08/15/1951 Referring Provider: Fulton Reek    Encounter Date: 06/26/2018  PT End of Session - 06/26/18 1549    Visit Number  44    Number of Visits  51    Date for PT Re-Evaluation  06/21/18    Authorization Type  Medicare     Authorization Time Period  04/25/18-06/21/18    PT Start Time  0300    PT Stop Time  0345    PT Time Calculation (min)  45 min    Equipment Utilized During Treatment  Gait belt    Activity Tolerance  No increased pain;Patient tolerated treatment well    Behavior During Therapy  [facility] for tasks assessed/performed       Past Medical History:  Diagnosis Date  . Anxiety   . Cancer ([place])   . Depression   . Hypertension   . Parkinson's disease [facility])     Past Surgical History:  Procedure Laterality Date  . BACK SURGERY    . CHOLECYSTECTOMY    . DEEP BRAIN STIMULATOR PLACEMENT     for Parkinson's disease  . JOINT REPLACEMENT     left knee x2  . ORCHIECTOMY  1983    There were no vitals filed for this visit.  Subjective Assessment - 06/26/18 1547    Subjective  Pt reports that he is doing well today.No pain bearing weight through RLE. No specific questions or concerns at this time.     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  Had PT at [facility] for 7 days.  Used the bike 6-10 minutes, as well as worked on walking, ROM for his R knee.  Started walking with a rw and is currently using a rollator walker.  Pt also adds that he was in a wc since October 2017 until the surgery due to pain. Patient has been on antiobiotic now due to the left knee being infected. The MD will test him in april to see if he continues to have an infection and if he needs to have  any more surgery on the left knee. Patients baseline for ambulation is that he was able to ambulate with a spc outdoors. He had PT in July for strengthening to be able to walk with a cane instead of the rollator.     Limitations  Walking;Standing    How long can you sit comfortably?  Not limited.     How long can you stand comfortably?  60-90 seconds.     How long can you walk comfortably?  15-30 minutes, limited by Right foot pain which has declined since starting PT.     Patient Stated Goals  I want to walk without a walker, improve endurance.    Currently in Pain?  Yes    Pain Score  2     Pain Location  Foot    Pain Orientation  Right    Pain Descriptors / Indicators  Aching    Pain Onset  In the past 7 days    Pain Frequency  Constant    Multiple Pain Sites  No       TREATMENT  Ther-ex  NuStep L3 x 5 minsfor warm-up during history  Hamstring  stretch BLE 30s x 2; Calf stretch BLE 30s x 2; Single knee to chest stretch BLE 30s x 2; Piriformis stretch BLE 30s x 2; Figure 4 stretch BLE 30s x 2; SLR with intervals 30 deg , 60 deg x 15 BLE Lower abdominal leg lifts in hooklying with mini crunch 3s hold x 15; Hooklying clams with manual resistance 2s hold x 15; Sidelying SLR hip abduction BLE x 15 each, cues to avoid hip flexor substitution; Hooklying bridges 3s hold x 15; Standing foot fwd/ foot bwd big step alternating LE x 10 without UE supportl Lunges into BOSU ball x 10 BLE Side stepping in parallel bars x 4 laps Stepping over hurdles fwd/bwd, side to side x 10  Written HEP given and instructed   Patient tolerates stretching with no increased pain following treatment and reported improvement in flexibility. Pt requires assistance with stretching as he is unable to perform sufficiently himself.                         PT Education - 06/26/18 1548    Education provided  Yes    Education Details  HEP    Person(s) Educated  Patient    Methods   Explanation    Comprehension  Verbalized understanding       PT Short Term Goals - 11/14/17 0848      PT SHORT TERM GOAL #1   Title  Pt will be able to perform sit <> stand transfer from a regular chair independently without UE assist to promote function.     Baseline  needs UE support    Time  4    Period  Weeks    Status  New    Target Date  12/11/17      PT SHORT TERM GOAL #2   Title  Patient will be able to stand at counter for 15 minutes to be able to complete a standing activity    Baseline  Patient has limited standing time with UE support and AD    Time  4    Period  Weeks    Status  New    Target Date  12/11/17        PT Long Term Goals - 06/26/18 1802      PT LONG TERM GOAL #1   Title  Patient will be independent in home exercise program to improve strength/mobility for better functional independence with ADLs    Baseline  stretching and strengthening    Time  8    Period  Weeks    Status  On-going    Target Date  08/16/18      PT LONG TERM GOAL #2   Title  Patient will ascend/descend 4 stairs with rail assist independently and ability to bring his rollator to the top or bottom of the steps , without loss of balance to improve ability to get in/out of home    Baseline  Patient is able to ascend and descend steps with railing and CGA    Time  8    Period  Weeks    Status  On-going    Target Date  08/16/18      PT LONG TERM GOAL #3   Title  Patient will increase 10 meter walk test to >1.106m/s as to improve gait speed for better community ambulation and to reduce fall risk.    Baseline  1.31 m/sec ;01/15/18= 1.31 m/sec, 02/20/18 1.31 m/sec 03/20/18=1.31  Time  8    Period  Weeks    Status  Achieved    Target Date  08/16/18      PT LONG TERM GOAL #4   Title  Patient (> 55 years old) will complete five times sit to stand test in < 15 seconds indicating an increased LE strength and improved balance.    Baseline  18.22 sec,  01/15/18=  18.22, 02/20/17 =18.22 sec  03/20/18 = 14.83 sec    Time  8    Period  Weeks    Status  Achieved    Target Date  08/16/18      PT LONG TERM GOAL #5   Title  Patient will ambulate 50 feet indoors with spc without falls or using the furniture for support.    Baseline  spc 75 % of the time indoors    Time  8    Period  Weeks    Status  Achieved    Target Date  08/16/18            Plan - 06/26/18 1549    Clinical Impression Statement  Pt requires direction and verbal cues for correct performance of exercises. Patient demonstrates weakness in BLE and performs open and closed chain exercises with minimal reports of pain to left knee. Pt was able to perform all exercises with max assist and VC for technique..Discussed left knee lag in extension during standing and ways to strengthen it. Patient struggles with speed during movement as well as balance with unstable surfaces. Pt encouraged continuing new supine HEP .Follow-up as scheduled.    Rehab Potential  Good    Clinical Impairments Affecting Rehab Potential  chronicity of condition, Parkinson's disease, TKR BLE    PT Frequency  2x / week    PT Duration  8 weeks    PT Treatment/Interventions  Neuromuscular re-education;Manual techniques;Therapeutic exercise;Therapeutic activities;Aquatic Therapy;Gait training;Patient/family education;Cryotherapy;Stair training;Balance training;Dry needling;Taping    PT Next Visit Plan  Knee extension ROM, gait, functional strengthening, balance training    PT Home Exercise Plan  Update HEP next visit.     Consulted and Agree with Plan of Care  Patient       Patient will benefit from skilled therapeutic intervention in order to improve the following deficits and impairments:  Pain, Postural dysfunction, Improper body mechanics, Difficulty walking, Decreased strength, Decreased range of motion, Abnormal gait, Decreased balance, Decreased endurance, Decreased mobility, Impaired sensation, Decreased activity tolerance, Impaired  flexibility  Visit Diagnosis: Difficulty in walking, not elsewhere classified - Plan: PT plan of care cert/re-cert  Muscle weakness (generalized) - Plan: PT plan of care cert/re-cert  Chronic pain of right knee - Plan: PT plan of care cert/re-cert     Problem List Patient Active Problem List   Diagnosis Date Noted  . Hypogonadism in male 06/18/2018  . Testicular cancer ([place]) 06/13/2018  . Hypertension 06/13/2018  . Hyperlipidemia 06/13/2018  . Depression 06/13/2018  . Anxiety 06/13/2018  . Callus of foot 03/27/2018  . Pes planovalgus, acquired, right 01/30/2018  . Arthritis of midfoot 01/30/2018  . Achilles tendon contracture, right 01/30/2018  . Left shoulder pain 07/26/2017  . Infection of prosthetic left knee joint ([place]) 07/10/2017  . 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[place]) 05/15/2017  . GERD (gastroesophageal reflux disease) 03/20/2017  . Primary osteoarthritis of one knee, right 12/30/2016  . H/O total knee replacement, left 12/30/2016  . Moderate recurrent major depression ([place]) 08/15/2016  . Chest pain due to myocardial ischemia 08/14/2016  .  Chest pain, rule out acute myocardial infarction 08/14/2016  . Chest x-ray abnormality 10/17/2013  . Scoliosis 07/11/2012  . Arthritis of wrist, left, degenerative 07/11/2012  . Parkinson's disease ([place]) 01/07/2012    Alanson Puls, PT DPT 06/27/2018, 6:03 PM  [place] MAIN [facility] SERVICES [address] [place], [place], [address] Phone: [phone]   Fax:  [phone]  Name: Charles Choi MRN: [id number] Date of Birth: Oct 24, 1951

## 2018-06-27 NOTE — Addendum Note (Signed)
Addended by: Alanson Puls on: 06/27/2018 06:03 PM   Modules accepted: Orders

## 2018-06-28 ENCOUNTER — Ambulatory Visit: Payer: Medicare Other | Admitting: Physical Therapy

## 2018-06-28 NOTE — Telephone Encounter (Signed)
Pt informed, he will come back for [facility]. He will proceed with Litholink

## 2018-06-29 ENCOUNTER — Telehealth: Payer: Self-pay

## 2018-06-29 DIAGNOSIS — E291 Testicular hypofunction: Secondary | ICD-10-CM

## 2018-06-29 NOTE — Telephone Encounter (Signed)
Pt informed, Litholink ordered, pt states he has no preference in therapy, whatever you suggest is fine with him.

## 2018-06-30 NOTE — Telephone Encounter (Signed)
Still awaiting LH results to determine best therapy.  Has his LH been drawn?

## 2018-07-03 ENCOUNTER — Ambulatory Visit: Payer: Medicare Other | Admitting: Physical Therapy

## 2018-07-04 NOTE — Addendum Note (Signed)
Addended by: Garnette Gunner on: 07/04/2018 01:48 PM   Modules accepted: Orders

## 2018-07-04 NOTE — Telephone Encounter (Signed)
Pt is going to try and come in on Tuesday 07/10/18.

## 2018-07-05 ENCOUNTER — Ambulatory Visit: Payer: Medicare Other | Admitting: Physical Therapy

## 2018-07-10 ENCOUNTER — Telehealth: Payer: Self-pay | Admitting: Urology

## 2018-07-10 NOTE — Telephone Encounter (Signed)
Called and spoke with pt, he wanted to let us know that he received his litholink and will start it tomorrow. Pt will bring urine in on Thursday and have labs drawn.

## 2018-07-10 NOTE — Telephone Encounter (Signed)
Pt lmom asking to speak to chris, pt did not leave specific details other than d.o.b and call back number of [phone]. Please advise. Thanks

## 2018-07-11 ENCOUNTER — Ambulatory Visit: Payer: Medicare Other | Attending: Internal Medicine | Admitting: Physical Therapy

## 2018-07-11 ENCOUNTER — Encounter: Payer: Self-pay | Admitting: Physical Therapy

## 2018-07-11 DIAGNOSIS — R262 Difficulty in walking, not elsewhere classified: Secondary | ICD-10-CM | POA: Insufficient documentation

## 2018-07-11 DIAGNOSIS — M25561 Pain in right knee: Secondary | ICD-10-CM | POA: Diagnosis present

## 2018-07-11 DIAGNOSIS — G8929 Other chronic pain: Secondary | ICD-10-CM | POA: Diagnosis present

## 2018-07-11 NOTE — Therapy (Signed)
[place] MAIN [facility] SERVICES [address] [place], [place], [address] Phone: [phone]   Fax:  [phone]  Physical Therapy Treatment  Patient Details  Name: Charles Choi MRN: [id number] Date of Birth: 1951-09-07 Referring Provider: Fulton Reek    Encounter Date: 07/11/2018  PT End of Session - 07/11/18 1702    Visit Number  45    Number of Visits  51    Date for PT Re-Evaluation  08/16/18    Authorization Type  Medicare     Authorization Time Period  04/25/18-06/21/18    PT Start Time  0400    PT Stop Time  0438    PT Time Calculation (min)  38 min    Equipment Utilized During Treatment  Gait belt    Activity Tolerance  No increased pain;Patient tolerated treatment well    Behavior During Therapy  [facility] for tasks assessed/performed       Past Medical History:  Diagnosis Date  . Anxiety   . Cancer ([place])   . Depression   . Hypertension   . Parkinson's disease [facility])     Past Surgical History:  Procedure Laterality Date  . BACK SURGERY    . CHOLECYSTECTOMY    . DEEP BRAIN STIMULATOR PLACEMENT     for Parkinson's disease  . JOINT REPLACEMENT     left knee x2  . ORCHIECTOMY  1983    There were no vitals filed for this visit.  Subjective Assessment - 07/11/18 1659    Subjective  Pt reports that he is doing well today. Patient has pain in R ankle and foot 2/10.  No specific questions or concerns at this time.     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  Had PT at [facility] for 7 days.  Used the bike 6-10 minutes, as well as worked on walking, ROM for his R knee.  Started walking with a rw and is currently using a rollator walker.  Pt also adds that he was in a wc since October 2017 until the surgery due to pain. Patient has been on antiobiotic now due to the left knee being infected. The MD will test him in april to see if he continues to have an infection and if he needs  to have any more surgery on the left knee. Patients baseline for ambulation is that he was able to ambulate with a spc outdoors. He had PT in July for strengthening to be able to walk with a cane instead of the rollator.     Limitations  Walking;Standing    How long can you sit comfortably?  Not limited.     How long can you stand comfortably?  60-90 seconds.     How long can you walk comfortably?  15-30 minutes, limited by Right foot pain which has declined since starting PT.     Patient Stated Goals  I want to walk without a walker, improve endurance.    Currently in Pain?  Yes    Pain Score  2     Pain Location  Ankle    Pain Orientation  Right    Pain Descriptors / Indicators  Aching    Pain Onset  In the past 7 days    Pain Frequency  Intermittent          NEUROMUSCULAR RE-EDUCATION  Stepping fwd and bwd without UE support and weight shift over  stance leg x 10 BLE marching in parallel bars x 20 SLR with intervals 30 deg , 60 deg x 15 BLE hooklying hip ER/ abd with TA x 40 with RTB sidelying hip abd x 15 BLE sidelying hip flex/ ext x 15 BLE Quad set LLE x 5 sec hold x 10 SAQ small bolster x 10 with 5 sec hold Standing with knee extensio with GTB and 5 sec Hamstring stretch BLE, calf stretch BLE 30 sec x 3  Hip flexor stretch BLE 30 sec    Patient has decreased standing tolerance due to right foot pain and fatigue in standing position.                       PT Education - 07/11/18 1700    Education provided  Yes    Education Details  HEP, stretching    Person(s) Educated  Patient    Methods  Explanation    Comprehension  Verbalized understanding;Returned demonstration       PT Short Term Goals - 11/14/17 0848      PT SHORT TERM GOAL #1   Title  Pt will be able to perform sit <> stand transfer from a regular chair independently without UE assist to promote function.     Baseline  needs UE support    Time  4    Period  Weeks    Status  New     Target Date  12/11/17      PT SHORT TERM GOAL #2   Title  Patient will be able to stand at counter for 15 minutes to be able to complete a standing activity    Baseline  Patient has limited standing time with UE support and AD    Time  4    Period  Weeks    Status  New    Target Date  12/11/17        PT Long Term Goals - 06/26/18 1802      PT LONG TERM GOAL #1   Title  Patient will be independent in home exercise program to improve strength/mobility for better functional independence with ADLs    Baseline  stretching and strengthening    Time  8    Period  Weeks    Status  On-going    Target Date  08/16/18      PT LONG TERM GOAL #2   Title  Patient will ascend/descend 4 stairs with rail assist independently and ability to bring his rollator to the top or bottom of the steps , without loss of balance to improve ability to get in/out of home    Baseline  Patient is able to ascend and descend steps with railing and CGA    Time  8    Period  Weeks    Status  On-going    Target Date  08/16/18      PT LONG TERM GOAL #3   Title  Patient will increase 10 meter walk test to >1.37m/s as to improve gait speed for better community ambulation and to reduce fall risk.    Baseline  1.31 m/sec ;01/15/18= 1.31 m/sec, 02/20/18 1.31 m/sec 03/20/18=1.31    Time  8    Period  Weeks    Status  Achieved    Target Date  08/16/18      PT LONG TERM GOAL #4   Title  Patient (> 59 years old) will complete five times sit to stand test in <  15 seconds indicating an increased LE strength and improved balance.    Baseline  18.22 sec,  01/15/18=  18.22, 02/20/17 =18.22 sec 03/20/18 = 14.83 sec    Time  8    Period  Weeks    Status  Achieved    Target Date  08/16/18      PT LONG TERM GOAL #5   Title  Patient will ambulate 50 feet indoors with spc without falls or using the furniture for support.    Baseline  spc 75 % of the time indoors    Time  8    Period  Weeks    Status  Achieved    Target Date   08/16/18            Plan - 07/11/18 1703    Clinical Impression Statement  Patient  ha right foot and toe pain and does not tolerate therapy session well today. Patient performed static balance challenges  as well as stepping activities; required CGA to prevent LOB with VCs for proper technique and positioning. Pt performed supine strengthening exercises required VCs for proper technique and maintaining correct techniques throughout exercises. Pt did require rest breaks secondary to fatigue throughout the session between many activities. Pt will benefit from additional skilled PT intervention for improvements in balance, strength, and gait safety    Rehab Potential  Good    Clinical Impairments Affecting Rehab Potential  chronicity of condition, Parkinson's disease, TKR BLE    PT Frequency  2x / week    PT Duration  8 weeks    PT Treatment/Interventions  Neuromuscular re-education;Manual techniques;Therapeutic exercise;Therapeutic activities;Aquatic Therapy;Gait training;Patient/family education;Cryotherapy;Stair training;Balance training;Dry needling;Taping    PT Next Visit Plan  Knee extension ROM, gait, functional strengthening, balance training    PT Home Exercise Plan  Update HEP next visit.     Consulted and Agree with Plan of Care  Patient       Patient will benefit from skilled therapeutic intervention in order to improve the following deficits and impairments:  Pain, Postural dysfunction, Improper body mechanics, Difficulty walking, Decreased strength, Decreased range of motion, Abnormal gait, Decreased balance, Decreased endurance, Decreased mobility, Impaired sensation, Decreased activity tolerance, Impaired flexibility  Visit Diagnosis: Difficulty in walking, not elsewhere classified  Chronic pain of right knee     Problem List Patient Active Problem List   Diagnosis Date Noted  . Hypogonadism in male 06/18/2018  . Testicular cancer ([place]) 06/13/2018  . Hypertension  06/13/2018  . Hyperlipidemia 06/13/2018  . Depression 06/13/2018  . Anxiety 06/13/2018  . Callus of foot 03/27/2018  . Pes planovalgus, acquired, right 01/30/2018  . Arthritis of midfoot 01/30/2018  . Achilles tendon contracture, right 01/30/2018  . Left shoulder pain 07/26/2017  . Infection of prosthetic left knee joint ([place]) 07/10/2017  . 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[place]) 05/15/2017  . GERD (gastroesophageal reflux disease) 03/20/2017  . Primary osteoarthritis of one knee, right 12/30/2016  . H/O total knee replacement, left 12/30/2016  . Moderate recurrent major depression ([place]) 08/15/2016  . Chest pain due to myocardial ischemia 08/14/2016  . Chest pain, rule out acute myocardial infarction 08/14/2016  . Chest x-ray abnormality 10/17/2013  . Scoliosis 07/11/2012  . Arthritis of wrist, left, degenerative 07/11/2012  . Parkinson's disease ([place]) 01/07/2012    Alanson Puls, PT DPT 07/11/2018, 5:06 PM  [place] MAIN [facility] SERVICES [address] [place], [place], [address] Phone: [phone]   Fax:  [phone]  Name:  Charles Choi MRN: [id number] Date of Birth: 10-24-1951

## 2018-07-12 ENCOUNTER — Other Ambulatory Visit: Payer: Medicare Other

## 2018-07-13 DIAGNOSIS — N183 Chronic kidney disease, stage 3 unspecified: Secondary | ICD-10-CM | POA: Insufficient documentation

## 2018-07-13 DIAGNOSIS — N529 Male erectile dysfunction, unspecified: Secondary | ICD-10-CM | POA: Insufficient documentation

## 2018-07-13 DIAGNOSIS — G4700 Insomnia, unspecified: Secondary | ICD-10-CM | POA: Insufficient documentation

## 2018-07-18 ENCOUNTER — Ambulatory Visit: Payer: Medicare Other | Admitting: Physical Therapy

## 2018-07-20 DIAGNOSIS — I2510 Atherosclerotic heart disease of native coronary artery without angina pectoris: Secondary | ICD-10-CM | POA: Insufficient documentation

## 2018-07-20 DIAGNOSIS — R9439 Abnormal result of other cardiovascular function study: Secondary | ICD-10-CM | POA: Insufficient documentation

## 2018-07-24 ENCOUNTER — Ambulatory Visit: Payer: Medicare Other | Admitting: Physical Therapy

## 2018-07-26 ENCOUNTER — Ambulatory Visit: Payer: Medicare Other | Admitting: Physical Therapy

## 2018-07-31 ENCOUNTER — Ambulatory Visit: Payer: Medicare Other | Admitting: Physical Therapy

## 2018-08-02 ENCOUNTER — Ambulatory Visit: Payer: Medicare Other | Admitting: Physical Therapy

## 2018-08-06 ENCOUNTER — Other Ambulatory Visit: Payer: Self-pay | Admitting: Urology

## 2018-08-07 ENCOUNTER — Other Ambulatory Visit: Payer: Self-pay | Admitting: Family Medicine

## 2018-08-07 ENCOUNTER — Ambulatory Visit: Payer: Medicare Other | Admitting: Physical Therapy

## 2018-08-07 ENCOUNTER — Telehealth: Payer: Self-pay | Admitting: Family Medicine

## 2018-08-07 NOTE — Telephone Encounter (Signed)
He had 2 significant abnormalities which predispose him to forming uric acid stones.  His 24-hour urine volume was low at 910 mL.  Recommend he increase his water intake keep urine output greater than 2 L/day.  Typically 3 L of water/day is enough volume to produce this output.  His urine pH is also low at 5.1.  Recommend starting potassium citrate 15 M EQ twice daily with food.  Recommend a urinalysis and approximately 6 weeks to recheck his pH.  Recommend a follow-up office visit in 4 months.

## 2018-08-07 NOTE — Telephone Encounter (Signed)
Spoke to patient, he is requesting the results on his LithoLink, scanned in to chart. Please review and advise.

## 2018-08-08 ENCOUNTER — Other Ambulatory Visit: Payer: Self-pay | Admitting: Family Medicine

## 2018-08-08 DIAGNOSIS — N209 Urinary calculus, unspecified: Secondary | ICD-10-CM

## 2018-08-08 MED ORDER — POTASSIUM CITRATE ER 15 MEQ (1620 MG) PO TBCR: 1.0000 | EXTENDED_RELEASE_TABLET | Freq: Two times a day (BID) | ORAL | 2 refills | Status: DC

## 2018-08-08 NOTE — Addendum Note (Signed)
Addended by: Kyra Manges on: 08/08/2018 09:52 AM   Modules accepted: Orders

## 2018-08-08 NOTE — Telephone Encounter (Signed)
Patient notified, RX sent to pharmacy, lab ordered and scheduled, follow up scheduled.

## 2018-08-09 ENCOUNTER — Ambulatory Visit: Payer: Medicare Other | Admitting: Physical Therapy

## 2018-08-10 ENCOUNTER — Other Ambulatory Visit: Payer: Medicare Other

## 2018-08-10 ENCOUNTER — Other Ambulatory Visit: Payer: Self-pay

## 2018-08-10 DIAGNOSIS — N209 Urinary calculus, unspecified: Secondary | ICD-10-CM

## 2018-08-10 DIAGNOSIS — E291 Testicular hypofunction: Secondary | ICD-10-CM

## 2018-08-10 LAB — URINALYSIS, COMPLETE
Bilirubin, UA: NEGATIVE
Glucose, UA: NEGATIVE
KETONES UA: NEGATIVE
LEUKOCYTES UA: NEGATIVE
Nitrite, UA: NEGATIVE
PH UA: 5.5 (ref 5.0–7.5)
RBC, UA: NEGATIVE
UUROB: 0.2 mg/dL (ref 0.2–1.0)

## 2018-08-10 LAB — MICROSCOPIC EXAMINATION
Epithelial Cells (non renal): NONE SEEN /hpf (ref 0–10)
RBC, UA: NONE SEEN /hpf (ref 0–2)
WBC UA: NONE SEEN /HPF (ref 0–5)

## 2018-08-11 LAB — LUTEINIZING HORMONE: LH: 7.7 m[IU]/mL (ref 1.7–8.6)

## 2018-08-13 ENCOUNTER — Ambulatory Visit: Payer: Medicare Other | Admitting: Physical Therapy

## 2018-08-13 ENCOUNTER — Telehealth: Payer: Self-pay

## 2018-08-13 NOTE — Telephone Encounter (Signed)
Patient notified he states he has only been taking for 3 days.  Patient also wanted to know about LH level? Is he ok to start injection therapy?

## 2018-08-13 NOTE — Telephone Encounter (Signed)
-----   Message from Abbie Sons, MD sent at 08/10/2018  4:39 PM EDT ----- Urine pH was still acidic.  How long has he been taking the potassium citrate?

## 2018-08-15 ENCOUNTER — Ambulatory Visit: Payer: Medicare Other | Admitting: Physical Therapy

## 2018-08-19 NOTE — Telephone Encounter (Signed)
LH level was normal.  He is okay to start testosterone injections.  Recommend testosterone level 6 weeks after starting therapy and a repeat urinalysis.

## 2018-08-29 ENCOUNTER — Encounter
Admission: RE | Admit: 2018-08-29 | Discharge: 2018-08-29 | Disposition: A | Discharge status: Home / Self Care | Payer: Medicare Other | Source: Ambulatory Visit | Attending: Internal Medicine | Admitting: Internal Medicine

## 2018-09-10 ENCOUNTER — Encounter: Payer: Self-pay | Admitting: Urology

## 2018-09-11 ENCOUNTER — Encounter
Admission: RE | Admit: 2018-09-11 | Discharge: 2018-09-11 | Disposition: A | Discharge status: Home / Self Care | Payer: Medicare Other | Source: Ambulatory Visit | Attending: Internal Medicine | Admitting: Internal Medicine

## 2018-09-12 ENCOUNTER — Telehealth: Payer: Self-pay | Admitting: Family Medicine

## 2018-09-12 MED ORDER — TESTOSTERONE CYPIONATE 200 MG/ML IM SOLN: 140.0000 mg | INTRAMUSCULAR | 0 refills | Status: DC

## 2018-09-12 NOTE — Telephone Encounter (Signed)
I spoke to patient and he is aware that he can start the injection therapy. He states he is in Rehab for Post foot surgery and he will fill the RX and start the testosterone therapy when he recovers. The RX has not yet been sent to [facility] pharmacy.

## 2018-09-12 NOTE — Telephone Encounter (Signed)
Rx was sent.  Does he need to schedule visits for injection teaching?

## 2018-09-13 ENCOUNTER — Non-Acute Institutional Stay (SKILLED_NURSING_FACILITY): Payer: Medicare Other | Admitting: Adult Health

## 2018-09-13 ENCOUNTER — Encounter: Payer: Self-pay | Admitting: Adult Health

## 2018-09-13 DIAGNOSIS — E291 Testicular hypofunction: Secondary | ICD-10-CM

## 2018-09-13 DIAGNOSIS — I10 Essential (primary) hypertension: Secondary | ICD-10-CM

## 2018-09-13 DIAGNOSIS — K219 Gastro-esophageal reflux disease without esophagitis: Secondary | ICD-10-CM | POA: Diagnosis not present

## 2018-09-13 DIAGNOSIS — M6701 Short Achilles tendon (acquired), right ankle: Secondary | ICD-10-CM | POA: Diagnosis not present

## 2018-09-13 NOTE — Progress Notes (Signed)
Location:   [facility] Room Number: 205 A Place of Service:  SNF (31)   CODE STATUS: Full Code  Allergies  Allergen Reactions  . Phenergan [Promethazine Hcl] Other (See Comments)    Unknown   . Reglan [Metoclopramide] Other (See Comments)    Pass out     Chief Complaint  Patient presents with  . Medical Management of Chronic Issues    Essential hypertension, benign; gastroesophageal without esophagitis; hypogonadism in male; achilles tendon contracture right.   Weekly follow up for the first 30 days post hospitalization      HPI:  He is a 67 year old short term rehab patient being seen for the management of his chronic illnesses: hypertension; gerd; hypogonadism; right achilles tendon contracture. He denies any uncontrolled pain; no changes in appetite; no heart burn.   Past Medical History:  Diagnosis Date  . Anxiety   . Cancer ([place])   . Depression   . Hypertension   . Parkinson's disease [facility])     Past Surgical History:  Procedure Laterality Date  . BACK SURGERY    . CHOLECYSTECTOMY    . DEEP BRAIN STIMULATOR PLACEMENT     for Parkinson's disease  . JOINT REPLACEMENT     left knee x2  . ORCHIECTOMY  1983    Social History   Socioeconomic History  . Marital status: Legally Separated    Spouse name: Not on file  . Number of children: Not on file  . Years of education: Not on file  . Highest education level: Not on file  Occupational History  . Not on file  Social Needs  . Financial resource strain: Not on file  . Food insecurity:    Worry: Not on file    Inability: Not on file  . Transportation needs:    Medical: Not on file    Non-medical: Not on file  Tobacco Use  . Smoking status: Never Smoker  . Smokeless tobacco: Never Used  Substance and Sexual Activity  . Alcohol use: Yes    Comment: 1 drink per week  . Drug use: Yes    Types: Marijuana  . Sexual activity: Not on file  Lifestyle  . Physical activity:    Days per week:  Not on file    Minutes per session: Not on file  . Stress: Not on file  Relationships  . Social connections:    Talks on phone: Not on file    Gets together: Not on file    Attends religious service: Not on file    Active member of club or organization: Not on file    Attends meetings of clubs or organizations: Not on file    Relationship status: Not on file  . Intimate partner violence:    Fear of current or ex partner: Not on file    Emotionally abused: Not on file    Physically abused: Not on file    Forced sexual activity: Not on file  Other Topics Concern  . Not on file  Social History Narrative  . Not on file   History reviewed. No pertinent family history.    VITAL SIGNS BP 130/88   Pulse 91   Temp 97.7 F (36.5 C)   Resp 20   Ht 5\' 9"  (1.753 m)   Wt 176 lb 9.6 oz (80.1 kg)   SpO2 98%   BMI 26.08 kg/m   Outpatient Encounter Medications as of 09/13/2018  Medication Sig  . acetaminophen (  TYLENOL) 325 MG tablet Take 925 mg by mouth every 6 (six) hours as needed for mild pain or moderate pain.  Marland Kitchen amantadine (SYMMETREL) 100 MG capsule Take 100 mg by mouth daily.  Marland Kitchen aspirin 325 MG tablet Take 325 mg by mouth 2 (two) times daily.  Marland Kitchen atorvastatin (LIPITOR) 20 MG tablet Take 20 mg by mouth daily.  . carbidopa-levodopa (SINEMET IR) 25-100 MG per tablet Take 1 tablet by mouth 2 (two) times daily. Take 1 tablet orally two times a day at 7am, and 10pm. Take 0.5 tablet orally at 10am, 1pm, 4pm, and 7pm.  . cholecalciferol (VITAMIN D3) 25 MCG (1000 UT) tablet Take 1,000 Units by mouth daily.  . cyanocobalamin 1000 MCG tablet Take 1,000 mcg by mouth daily.  . hydrocortisone 2.5 % cream Apply I application topically to face daily as needed for dry skin  . ketoconazole (NIZORAL) 2 % cream Apply 1 application topically daily. Apply to flaky areas daily as needed  . losartan (COZAAR) 100 MG tablet Take 100 mg by mouth daily.   . Melatonin 3 MG CAPS Take 1-2 tablets by mouth 2 hours  before bedtime  . meloxicam (MOBIC) 7.5 MG tablet Take 7.5 mg by mouth daily.  . methocarbamol (ROBAXIN) 500 MG tablet Take 500 mg by mouth every 8 (eight) hours as needed for muscle spasms.  . metoprolol succinate (TOPROL-XL) 25 MG 24 hr tablet Take 25 mg by mouth 2 (two) times daily.  . NON FORMULARY Diet Type: Regular  . omeprazole (PRILOSEC OTC) 20 MG tablet Take 40 mg by mouth daily.   . ondansetron (ZOFRAN ODT) 4 MG disintegrating tablet Take 1 tablet (4 mg total) by mouth every 8 (eight) hours as needed for nausea or vomiting.  . Potassium Citrate 15 MEQ (1620 MG) TBCR Take 1 tablet by mouth 2 (two) times daily.  Marland Kitchen rOPINIRole (REQUIP) 2 MG tablet Take 2 mg by mouth every 3 (three) hours. At 7am, and 1pm.  . selegiline (ELDEPRYL) 5 MG capsule Take 5 mg by mouth 2 (two) times daily. At 7am and 1pm.  . senna-docusate (SENOKOT-S) 8.6-50 MG tablet Take 1 tablet by mouth 2 (two) times daily.  Marland Kitchen testosterone cypionate (DEPOTESTOSTERONE CYPIONATE) 200 MG/ML injection Inject 0.7 mLs (140 mg total) into the muscle every 14 (fourteen) days.  . traZODone (DESYREL) 150 MG tablet Take 100 mg by mouth at bedtime as needed.    No facility-administered encounter medications on file as of 09/13/2018.      SIGNIFICANT DIAGNOSTIC EXAMS  TODAY;   08-25-18: wbc 8.4; hgb 12.9; hc6 43.1; mcv 84; glucose 114; bun 17; creat 1.2; k+ 4.1; na++140    Review of Systems  Constitutional: Negative for malaise/fatigue.  Respiratory: Negative for cough and shortness of breath.   Cardiovascular: Negative for chest pain, palpitations and leg swelling.  Gastrointestinal: Negative for abdominal pain, constipation and heartburn.  Musculoskeletal: Negative for back pain, joint pain and myalgias.  Skin: Negative.   Neurological: Negative for dizziness.  Psychiatric/Behavioral: The patient is not nervous/anxious.    Physical Exam  Constitutional: He is oriented to person, place, and time. He appears well-developed  and well-nourished. No distress.  Neck: No thyromegaly present.  Cardiovascular: Normal rate, regular rhythm, normal heart sounds and intact distal pulses.  Pulmonary/Chest: Effort normal and breath sounds normal. No respiratory distress.  Abdominal: Soft. Bowel sounds are normal. He exhibits no distension. There is no tenderness.  Musculoskeletal: He exhibits no edema.  New cast on right leg Is able  to move all extremities   Lymphadenopathy:    He has no cervical adenopathy.  Neurological: He is alert and oriented to person, place, and time.  Skin: Skin is warm and dry. He is not diaphoretic.  Psychiatric: He has a normal mood and affect.      ASSESSMENT/ PLAN:  TODAY:   1. Essential hypertension, benign: is stable b/p 130/88: will continue toprol xl 25 mg daily cozaar 100 mg daily   2. GERD without esophagitis: is stable will continue prilosec 40 mg daily   3. 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 Parkinson's disease: is stable will continue amantadine 100 mg daily sinemet 25/100 twice daily and 1/2 tab four times daily requip 2 mg twice daily and selegiline 5 mg twice daily   5. Dyslipidemia: is stable will continue lipitor 20 mg daily   6. Moderate recurrent major depression: is stable takes trazodone 100 mg nightly as needed take melatonin 3 mg nightly   7. Chronic constipation: is stable will continue senna s twice daily   8. Hypokalemia: is stable will continue potassium citrate 15 meq twice daily   9. 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 We will attempt to wean resident as apropriate   Ok Edwards NP [facility]  Contact [phone] Monday through Friday 8am- 5pm  After hours call [phone]

## 2018-09-15 DIAGNOSIS — I10 Essential (primary) hypertension: Secondary | ICD-10-CM | POA: Insufficient documentation

## 2018-09-15 DIAGNOSIS — E785 Hyperlipidemia, unspecified: Secondary | ICD-10-CM | POA: Insufficient documentation

## 2018-09-17 ENCOUNTER — Telehealth: Payer: Self-pay | Admitting: Urology

## 2018-09-17 NOTE — Telephone Encounter (Signed)
I am not sure if he wanted to learn injections or not.  Would verify with patient and if he wants to learn give training appointment.

## 2018-09-17 NOTE — Telephone Encounter (Signed)
Patient has his medication does he need an app for an injection only or does his need an app for injection training?   Sharyn Lull

## 2018-09-18 NOTE — Telephone Encounter (Signed)
Lm for patient to cb to discuss what he wants to do   [place]

## 2018-09-18 NOTE — Telephone Encounter (Signed)
2 weeks for next injection requested from the patient via my chart. App made for patient  Patient got his first depo injection today It was given while he was at rehab by one of the nurse's there.   Sharyn Lull

## 2018-09-18 NOTE — Telephone Encounter (Signed)
Patient is in rehab and said that he was going to get the nurse there to give him his first injection and then once he has been released he will start getting them here with [place] every two weeks. I asked him to make sure he brings the documentation from the first injection so that we can have a record of it for his chart.   Sharyn Lull

## 2018-09-19 ENCOUNTER — Non-Acute Institutional Stay (SKILLED_NURSING_FACILITY): Payer: Medicare Other | Admitting: Adult Health

## 2018-09-19 ENCOUNTER — Encounter: Payer: Self-pay | Admitting: Adult Health

## 2018-09-19 ENCOUNTER — Other Ambulatory Visit: Payer: Self-pay | Admitting: Adult Health

## 2018-09-19 DIAGNOSIS — F331 Major depressive disorder, recurrent, moderate: Secondary | ICD-10-CM | POA: Diagnosis not present

## 2018-09-19 DIAGNOSIS — I10 Essential (primary) hypertension: Secondary | ICD-10-CM | POA: Diagnosis not present

## 2018-09-19 DIAGNOSIS — G20 Parkinson's disease: Secondary | ICD-10-CM | POA: Diagnosis not present

## 2018-09-19 DIAGNOSIS — M6701 Short Achilles tendon (acquired), right ankle: Secondary | ICD-10-CM

## 2018-09-19 MED ORDER — ONDANSETRON 4 MG PO TBDP: 4.0000 mg | ORAL_TABLET | Freq: Three times a day (TID) | ORAL | 0 refills | Status: DC | PRN

## 2018-09-19 MED ORDER — CARBIDOPA-LEVODOPA 25-100 MG PO TABS: ORAL_TABLET | ORAL | 0 refills | Status: DC

## 2018-09-19 MED ORDER — LOSARTAN POTASSIUM 100 MG PO TABS: 100.0000 mg | ORAL_TABLET | Freq: Every day | ORAL | 0 refills | Status: AC

## 2018-09-19 MED ORDER — ATORVASTATIN CALCIUM 20 MG PO TABS: 20.0000 mg | ORAL_TABLET | Freq: Every day | ORAL | 0 refills | Status: DC

## 2018-09-19 MED ORDER — METHOCARBAMOL 500 MG PO TABS: 500.0000 mg | ORAL_TABLET | Freq: Three times a day (TID) | ORAL | 0 refills | Status: DC | PRN

## 2018-09-19 MED ORDER — ROPINIROLE HCL 2 MG PO TABS: 2.0000 mg | ORAL_TABLET | ORAL | 0 refills | Status: DC

## 2018-09-19 MED ORDER — TRAZODONE HCL 150 MG PO TABS: 75.0000 mg | ORAL_TABLET | Freq: Every evening | ORAL | 0 refills | Status: DC | PRN

## 2018-09-19 MED ORDER — AMANTADINE HCL 100 MG PO CAPS: 100.0000 mg | ORAL_CAPSULE | Freq: Every day | ORAL | 0 refills | Status: DC

## 2018-09-19 MED ORDER — POTASSIUM CITRATE ER 15 MEQ (1620 MG) PO TBCR: 1.0000 | EXTENDED_RELEASE_TABLET | Freq: Two times a day (BID) | ORAL | 0 refills | Status: DC

## 2018-09-19 MED ORDER — MELOXICAM 7.5 MG PO TABS: 7.5000 mg | ORAL_TABLET | Freq: Every day | ORAL | 0 refills | Status: DC

## 2018-09-19 MED ORDER — METOPROLOL SUCCINATE ER 25 MG PO TB24: 25.0000 mg | ORAL_TABLET | Freq: Two times a day (BID) | ORAL | 0 refills | Status: DC

## 2018-09-19 MED ORDER — SELEGILINE HCL 5 MG PO CAPS: 5.0000 mg | ORAL_CAPSULE | Freq: Two times a day (BID) | ORAL | 0 refills | Status: DC

## 2018-09-19 NOTE — Progress Notes (Signed)
Location:   [place] Room Number: 329 Place of Service:  SNF (31)    CODE STATUS: full code   Allergies  Allergen Reactions  . Phenergan [Promethazine Hcl] Other (See Comments)    Unknown   . Reglan [Metoclopramide] Other (See Comments)    Pass out     Chief Complaint  Patient presents with  . Discharge Note    discharge on 09-20-18     HPI:  He is being discharge to home with home health for pt/ot. He will not need any dme; he has all necessary equipment. He will need his prescriptions written and will need to follow up with his medical provider.  He had been hospitalized for a right achilles tendon rupture. He was admitted to this facility for short term rehab and is now ready for discharge to home.     Past Medical History:  Diagnosis Date  . Anxiety   . Cancer ([place])   . Depression   . Hypertension   . Parkinson's disease [facility])     Past Surgical History:  Procedure Laterality Date  . BACK SURGERY    . CHOLECYSTECTOMY    . DEEP BRAIN STIMULATOR PLACEMENT     for Parkinson's disease  . JOINT REPLACEMENT     left knee x2  . ORCHIECTOMY  1983    Social History   Socioeconomic History  . Marital status: Legally Separated    Spouse name: Not on file  . Number of children: Not on file  . Years of education: Not on file  . Highest education level: Not on file  Occupational History  . Not on file  Social Needs  . Financial resource strain: Not on file  . Food insecurity:    Worry: Not on file    Inability: Not on file  . Transportation needs:    Medical: Not on file    Non-medical: Not on file  Tobacco Use  . Smoking status: Never Smoker  . Smokeless tobacco: Never Used  Substance and Sexual Activity  . Alcohol use: Yes    Comment: 1 drink per week  . Drug use: Yes    Types: Marijuana  . Sexual activity: Not on file  Lifestyle  . Physical activity:    Days per week: Not on file    Minutes per session: Not on file  . Stress:  Not on file  Relationships  . Social connections:    Talks on phone: Not on file    Gets together: Not on file    Attends religious service: Not on file    Active member of club or organization: Not on file    Attends meetings of clubs or organizations: Not on file    Relationship status: Not on file  . Intimate partner violence:    Fear of current or ex partner: Not on file    Emotionally abused: Not on file    Physically abused: Not on file    Forced sexual activity: Not on file  Other Topics Concern  . Not on file  Social History Narrative  . Not on file   History reviewed. No pertinent family history.  VITAL SIGNS BP 140/78   Pulse 98   Temp 97.8 F (36.6 C)   Resp 16   Ht 5\' 9"  (1.753 m)   Wt 175 lb (79.4 kg)   SpO2 98%   BMI 25.84 kg/m   Patient's Medications  New Prescriptions   No medications  on file  Previous Medications   ACETAMINOPHEN (TYLENOL) 325 MG TABLET    Take 925 mg by mouth every 6 (six) hours as needed for mild pain or moderate pain.   AMANTADINE (SYMMETREL) 100 MG CAPSULE    Take 100 mg by mouth daily.   ASPIRIN 325 MG TABLET    Take 325 mg by mouth 2 (two) times daily.   ATORVASTATIN (LIPITOR) 20 MG TABLET    Take 20 mg by mouth daily.   CARBIDOPA-LEVODOPA (SINEMET IR) 25-100 MG PER TABLET    Take 1 tablet by mouth 2 (two) times daily. Take 1 tablet orally two times a day at 7am, and 10pm. Take 0.5 tablet orally at 10am, 1pm, 4pm, and 7pm.   CHOLECALCIFEROL (VITAMIN D3) 25 MCG (1000 UT) TABLET    Take 1,000 Units by mouth daily.   CYANOCOBALAMIN 1000 MCG TABLET    Take 1,000 mcg by mouth daily.   HYDROCORTISONE 2.5 % CREAM    Apply I application topically to face daily as needed for dry skin   KETOCONAZOLE (NIZORAL) 2 % CREAM    Apply 1 application topically daily. Apply to flaky areas daily as needed   LOSARTAN (COZAAR) 100 MG TABLET    Take 100 mg by mouth daily.    MELATONIN 3 MG CAPS    Take 1-2 tablets by mouth 2 hours before bedtime    MELOXICAM (MOBIC) 7.5 MG TABLET    Take 7.5 mg by mouth daily.   METHOCARBAMOL (ROBAXIN) 500 MG TABLET    Take 500 mg by mouth every 8 (eight) hours as needed for muscle spasms.   METOPROLOL SUCCINATE (TOPROL-XL) 25 MG 24 HR TABLET    Take 25 mg by mouth 2 (two) times daily.   NON FORMULARY    Diet Type: Regular   OMEPRAZOLE (PRILOSEC OTC) 20 MG TABLET    Take 40 mg by mouth daily.    ONDANSETRON (ZOFRAN ODT) 4 MG DISINTEGRATING TABLET    Take 1 tablet (4 mg total) by mouth every 8 (eight) hours as needed for nausea or vomiting.   POTASSIUM CITRATE 15 MEQ (1620 MG) TBCR    Take 1 tablet by mouth 2 (two) times daily.   ROPINIROLE (REQUIP) 2 MG TABLET    Take 2 mg by mouth every 3 (three) hours. At 7am, and 1pm.   SELEGILINE (ELDEPRYL) 5 MG CAPSULE    Take 5 mg by mouth 2 (two) times daily. At 7am and 1pm.   SENNA-DOCUSATE (SENOKOT-S) 8.6-50 MG TABLET    Take 1 tablet by mouth 2 (two) times daily.   TESTOSTERONE CYPIONATE (DEPOTESTOSTERONE CYPIONATE) 200 MG/ML INJECTION    Inject 0.7 mLs (140 mg total) into the muscle every 14 (fourteen) days.   TRAZODONE (DESYREL) 150 MG TABLET    Take 100 mg by mouth at bedtime as needed.   Modified Medications   No medications on file  Discontinued Medications   No medications on file     SIGNIFICANT DIAGNOSTIC EXAMS   PREVIOUS ;   08-25-18: wbc 8.4; hgb 12.9; hc6 43.1; mcv 84; glucose 114; bun 17; creat 1.2; k+ 4.1; na++140   NO NEW LABS.     Review of Systems  Constitutional: Negative for malaise/fatigue.  Respiratory: Negative for cough and shortness of breath.   Cardiovascular: Negative for chest pain, palpitations and leg swelling.  Gastrointestinal: Negative for abdominal pain, constipation and heartburn.  Musculoskeletal: Negative for back pain, joint pain and myalgias.  Skin: Negative.   Neurological:  Negative for dizziness.  Psychiatric/Behavioral: The patient is not nervous/anxious.     Physical Exam  Constitutional: He is  oriented to person, place, and time. He appears well-developed and well-nourished. No distress.  Neck: No thyromegaly present.  Cardiovascular: Normal rate, regular rhythm, normal heart sounds and intact distal pulses.  Pulmonary/Chest: Effort normal and breath sounds normal. No respiratory distress.  Abdominal: Soft. Bowel sounds are normal. He exhibits no distension. There is no tenderness.  Musculoskeletal: He exhibits no edema.  Cast with boot on right leg Is able to move all extremities   Lymphadenopathy:    He has no cervical adenopathy.  Neurological: He is alert and oriented to person, place, and time.  Skin: Skin is warm and dry. He is not diaphoretic.  Psychiatric: He has a normal mood and affect.     ASSESSMENT/ PLAN:   Patient is being discharged with the following home health services:  Pt/ot: to evaluate and treat as indicated for gait balance strength adl training  Patient is being discharged with the following durable medical equipment:  None needed   Patient has been advised to f/u with their PCP in 1-2 weeks to bring them up to date on their rehab stay.  Social services at facility was responsible for arranging this appointment.  Pt was provided with a 30 day supply of prescriptions for medications and refills must be obtained from their PCP.  For controlled substances, a more limited supply may be provided adequate until PCP appointment only.  A 30 day supply os his prescription medications have been sent electronically to [facility]   Time spent with patient 35 minutes: discussed medications; home health needs and dme; verbalized understaind.    Ok Edwards NP [facility]  Contact [phone] Monday through Friday 8am- 5pm  After hours call [phone]

## 2018-09-26 ENCOUNTER — Other Ambulatory Visit: Payer: Self-pay

## 2018-10-02 ENCOUNTER — Encounter: Payer: Self-pay | Admitting: Urology

## 2018-10-02 ENCOUNTER — Ambulatory Visit (INDEPENDENT_AMBULATORY_CARE_PROVIDER_SITE_OTHER): Payer: Medicare Other

## 2018-10-02 DIAGNOSIS — E291 Testicular hypofunction: Secondary | ICD-10-CM | POA: Diagnosis not present

## 2018-10-02 MED ORDER — TESTOSTERONE CYPIONATE 200 MG/ML IM SOLN
140.0000 mg | Freq: Once | INTRAMUSCULAR | Status: AC
  Administered 2018-10-02: 140 mg via INTRAMUSCULAR

## 2018-10-02 NOTE — Progress Notes (Signed)
Testosterone IM Injection  Due to Hypogonadism patient is present today for a Testosterone Injection.  Medication: Testosterone Cypionate Dose: 0.37ml Location: left upper outer buttocks Lot: [id number] Exp:12/2019  Patient tolerated well, no complications were noted  Preformed by: Fonnie Jarvis, CMA  Follow up: 2weeks for next injection and testosterone drawn for 6 wk recheck on dose, order placed

## 2018-10-03 ENCOUNTER — Other Ambulatory Visit: Payer: Self-pay

## 2018-10-03 DIAGNOSIS — N5201 Erectile dysfunction due to arterial insufficiency: Secondary | ICD-10-CM

## 2018-10-03 MED ORDER — TADALAFIL 10 MG PO TABS: ORAL_TABLET | ORAL | 6 refills | Status: DC

## 2018-10-03 NOTE — Telephone Encounter (Signed)
See my chart encounter.

## 2018-10-16 ENCOUNTER — Ambulatory Visit: Payer: Self-pay

## 2018-10-16 ENCOUNTER — Other Ambulatory Visit: Payer: Self-pay | Admitting: Family Medicine

## 2018-10-16 MED ORDER — "SYRINGE/NEEDLE (DISP) 21G X 1"" 3 ML MISC": 0.7000 mL | 0 refills | Status: DC

## 2018-10-16 MED ORDER — "NEEDLE (DISP) 18G X 1"" MISC": 0.7000 mL | 0 refills | Status: DC

## 2018-11-03 ENCOUNTER — Other Ambulatory Visit: Payer: Self-pay | Admitting: Adult Health

## 2018-11-14 ENCOUNTER — Ambulatory Visit: Payer: Medicare Other | Attending: Internal Medicine | Admitting: Physical Therapy

## 2018-11-14 ENCOUNTER — Encounter: Payer: Self-pay | Admitting: Physical Therapy

## 2018-11-14 DIAGNOSIS — G8929 Other chronic pain: Secondary | ICD-10-CM | POA: Diagnosis present

## 2018-11-14 DIAGNOSIS — R262 Difficulty in walking, not elsewhere classified: Secondary | ICD-10-CM | POA: Insufficient documentation

## 2018-11-14 DIAGNOSIS — M25561 Pain in right knee: Secondary | ICD-10-CM | POA: Insufficient documentation

## 2018-11-14 DIAGNOSIS — R278 Other lack of coordination: Secondary | ICD-10-CM | POA: Diagnosis present

## 2018-11-14 DIAGNOSIS — R2681 Unsteadiness on feet: Secondary | ICD-10-CM | POA: Diagnosis present

## 2018-11-14 DIAGNOSIS — M6281 Muscle weakness (generalized): Secondary | ICD-10-CM | POA: Diagnosis present

## 2018-11-14 NOTE — Therapy (Signed)
[place] MAIN [facility] SERVICES [address] [place], [place], [address] Phone: [phone]   Fax:  [phone]  Physical Therapy Evaluation  Patient Details  Name: Charles Choi MRN: [id number] Date of Birth: 15-May-1951 Referring Provider (PT): Fulton Reek    Encounter Date: 11/14/2018  PT End of Session - 11/14/18 1611    Visit Number  1    Number of Visits  17    Date for PT Re-Evaluation  01/02/19    Authorization Type  Medicare     Authorization Time Period  --    PT Start Time  0400    PT Stop Time  0500    PT Time Calculation (min)  60 min    Equipment Utilized During Treatment  Gait belt    Activity Tolerance  No increased pain;Patient tolerated treatment well    Behavior During Therapy  [facility] for tasks assessed/performed       Past Medical History:  Diagnosis Date  . Anxiety   . Cancer ([place])   . Depression   . Hypertension   . Parkinson's disease [facility])     Past Surgical History:  Procedure Laterality Date  . BACK SURGERY    . CHOLECYSTECTOMY    . DEEP BRAIN STIMULATOR PLACEMENT     for Parkinson's disease  . JOINT REPLACEMENT     left knee x2  . ORCHIECTOMY  1983    There were no vitals filed for this visit.   Subjective Assessment - 11/14/18 1604    Subjective  Patient reports that he had surgery on his right foot september 2019. He was in a cast 2 months, then a walking boot for 4 weeks. Now he is wearing a soft brace for lateral stabiity. Patient had a heart cath. last friday 11/09/18.    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  Had PT at [facility] for 7 days.  Used the bike 6-10 minutes, as well as worked on walking, ROM for his R knee.  Started walking with a rw and is currently using a rollator walker.  Pt also adds that he was in a wc since October 2017 until the surgery due to pain. Patient has been on antiobiotic now due to the left knee being infected.  The MD will test him in april to see if he continues to have an infection and if he needs to have any more surgery on the left knee. Patients baseline for ambulation is that he was able to ambulate with a spc outdoors. He had PT in July for strengthening to be able to walk with a cane instead of the rollator.  He had PT in outpatient prior to ankle surgery to strengthen his LE's and improve his balance. In September he had ankle surgery , was in a cast then a boot and now is wearing his ankle brace.    Limitations  Walking;Standing    How long can you sit comfortably?  Not limited.     How long can you stand comfortably?  60-90 seconds.     How long can you walk comfortably?  15-30 minutes, limited by Right foot pain which has declined since starting PT.     Patient Stated Goals  I want to walk without a walker, improve endurance.    Currently in Pain?  Yes    Pain Score  10-Worst pain ever    Pain Location  Hand    Pain Orientation  Right    Pain Descriptors / Indicators  Aching    Pain Type  Chronic pain    Pain Onset  In the past 7 days         [facility] PT Assessment - 11/14/18 1555      Assessment   Medical Diagnosis  ataxia     Referring Provider (PT)  Fulton Reek     Hand Dominance  Right    Prior Therapy  Patient had outpatient therapy in july following his knee surgery       Precautions   Precautions  Fall      Balance Screen   Has the patient fallen in the past 6 months  Yes    How many times?  2    Has the patient had a decrease in activity level because of a fear of falling?   Yes    Is the patient reluctant to leave their home because of a fear of falling?   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    Leisure  play with grandson      Cognition   Overall Cognitive Status  Within Functional Limits for tasks assessed        POSTURE: flexed trunk  PROM/AROM: WFL BUE and BLE  STRENGTH:  Graded on a 0-5 scale Muscle Group Left Right                          Hip Flex 3/5 3/5  Hip Abd 3/5 3/5  Hip Add 3/5 3/5  Hip Ext 3/5 3/5  Hip IR/ER 3/5 3/5  Knee Flex 3/5 3/5  Knee Ext 3/5 3/5  Ankle DF 3/5 3/5  Ankle PF 3/5 3/5   SENSATION:WNL  FUNCTIONAL MOBILITY: transfers independent with UE support Independent with bed mobility    BALANCE: Standing Dynamic Balance  Normal Stand independently unsupported, able to weight shift and cross midline maximally   Good Stand independently unsupported, able to weight shift and cross midline moderately x  Good-/Fair+ Stand independently unsupported, able to weight shift across midline minimally   Fair Stand independently unsupported, weight shift, and reach ipsilaterally, loss of balance when crossing midline   Poor+ Able to stand with Min A and reach ipsilaterally, unable to weight shift   Poor Able to stand with Mod A and minimally reach ipsilaterally, unable to cross midline.    Static Standing Balance  Normal Able to maintain standing balance against maximal resistance   Good Able to maintain standing balance against moderate resistance x  Good-/Fair+ Able to maintain standing balance against minimal resistance   Fair Able to stand unsupported without UE support and without LOB for 1-2 min   Fair- Requires Min A and UE  support to maintain standing without loss of balance   Poor+ Requires mod A and UE support to maintain standing without loss of balance   Poor Requires max A and UE support to maintain standing  balance without loss       GAIT: Patient ambulates with rollator with limp and frequent stops and starts due to incoordination and decreased motor control of BLE for 300 feet and R ankle support brace to assist with lateral stability.  OUTCOME MEASURES: TEST Outcome Interpretation  5 times sit<>stand 15.81sec >60 yo, >15 sec indicates increased risk for falls  10 meter walk test    .74             m/s <1.0 m/s indicates increased risk for falls; limited community ambulator  Timed up and Go  18.72               sec <14 sec indicates increased risk for falls  6 minute walk test   430             Feet 1000 feet is community ambulator         Treatment: Instructed in stretching hamstrings bilaterally 30 sec x 3 reps Good tolerance        Objective measurements completed on examination: See above findings.              PT Education - 11/14/18 1610    Education provided  Yes    Education Details  HEP    Person(s) Educated  Patient    Methods  Explanation    Comprehension  Verbalized understanding;Returned demonstration       PT Short Term Goals - 11/14/17 0848      PT SHORT TERM GOAL #1   Title  Pt will be able to perform sit <> stand transfer from a regular chair independently without UE assist to promote function.     Baseline  needs UE support    Time  4    Period  Weeks    Status  New    Target Date  12/11/17      PT SHORT TERM GOAL #2   Title  Patient will be able to stand at counter for 15 minutes to be able to complete a standing activity    Baseline  Patient has limited standing time with UE support and AD    Time  4    Period  Weeks    Status  New    Target Date  12/11/17        PT Long Term Goals - 11/15/18 0818      PT LONG TERM GOAL #1   Title  Patient will be independent in home exercise program to improve strength/mobility for better functional independence with ADLs    Baseline  --    Time  8    Period  Weeks    Status  New     Target Date  01/09/19      PT LONG TERM GOAL #2   Title  Patient will ascend/descend 4 stairs with rail assist independently and ability to bring his rollator to the top or bottom of the steps , without loss of balance to improve ability to get in/out of home    Baseline  Patient is able to ascend and descend steps with railing and CGA    Time  8    Period  Weeks    Status  New    Target Date  01/09/19  PT LONG TERM GOAL #3   Title  Patient will increase 10 meter walk test to >1.72m/s as to improve gait speed for better community ambulation and to reduce fall risk.    Baseline  --    Time  8    Period  Weeks    Status  New    Target Date  01/09/19      PT LONG TERM GOAL #4   Title  Patient (> 48 years old) will complete five times sit to stand test in < 15 seconds indicating an increased LE strength and improved balance.    Baseline  --    Time  8    Period  Weeks    Status  New    Target Date  01/09/19      PT LONG TERM GOAL #5   Title  Patient will ambulate 50 feet indoors with spc without falls or using the furniture for support.    Baseline  --    Time  8    Period  Weeks    Status  New    Target Date  01/09/19               Patient will benefit from skilled therapeutic intervention in order to improve the following deficits and impairments:     Visit Diagnosis: Difficulty in walking, not elsewhere classified  Chronic pain of right knee  Muscle weakness (generalized)     Problem List Patient Active Problem List   Diagnosis Date Noted  . Essential hypertension, benign 09/15/2018  . Dyslipidemia 09/15/2018  . Hypogonadism in male 06/18/2018  . Testicular cancer ([place]) 06/13/2018  . Hyperlipidemia 06/13/2018  . Depression 06/13/2018  . Anxiety 06/13/2018  . Callus of foot 03/27/2018  . Pes planovalgus, acquired, right 01/30/2018  . Arthritis of midfoot 01/30/2018  . Achilles tendon contracture, right 01/30/2018  . Left shoulder pain 07/26/2017   . Infection of prosthetic left knee joint ([place]) 07/10/2017  . 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[place]) 05/15/2017  . GERD (gastroesophageal reflux disease) 03/20/2017  . Primary osteoarthritis of one knee, right 12/30/2016  . H/O total knee replacement, left 12/30/2016  . Moderate recurrent major depression ([place]) 08/15/2016  . Chest pain due to myocardial ischemia 08/14/2016  . Chest pain, rule out acute myocardial infarction 08/14/2016  . Chest x-ray abnormality 10/17/2013  . Scoliosis 07/11/2012  . Arthritis of wrist, left, degenerative 07/11/2012  . Parkinson's disease ([place]) 01/07/2012    Alanson Puls, PT DPT 11/15/2018, 8:39 AM  [place] MAIN [facility] SERVICES [address] [place], [place], [address] Phone: [phone]   Fax:  [phone]  Name: DARVIS CROFT MRN: [id number] Date of Birth: December 24, 1950

## 2018-11-20 ENCOUNTER — Ambulatory Visit: Payer: Medicare Other | Admitting: Physical Therapy

## 2018-11-22 ENCOUNTER — Encounter: Payer: Self-pay | Admitting: Physical Therapy

## 2018-11-22 ENCOUNTER — Ambulatory Visit: Payer: Medicare Other | Admitting: Physical Therapy

## 2018-11-22 DIAGNOSIS — R262 Difficulty in walking, not elsewhere classified: Secondary | ICD-10-CM | POA: Diagnosis not present

## 2018-11-22 DIAGNOSIS — G8929 Other chronic pain: Secondary | ICD-10-CM

## 2018-11-22 DIAGNOSIS — M6281 Muscle weakness (generalized): Secondary | ICD-10-CM

## 2018-11-22 DIAGNOSIS — M25561 Pain in right knee: Secondary | ICD-10-CM

## 2018-11-22 NOTE — Therapy (Signed)
[place] MAIN [facility] SERVICES [address] [place], [place], [address] Phone: [phone]   Fax:  [phone]  Physical Therapy Treatment  Patient Details  Name: Charles Choi MRN: [id number] Date of Birth: September 26, 1951 Referring Provider (PT): Fulton Reek    Encounter Date: 11/22/2018    Past Medical History:  Diagnosis Date  . Anxiety   . Cancer ([place])   . Depression   . Hypertension   . Parkinson's disease [facility])     Past Surgical History:  Procedure Laterality Date  . BACK SURGERY    . CHOLECYSTECTOMY    . DEEP BRAIN STIMULATOR PLACEMENT     for Parkinson's disease  . JOINT REPLACEMENT     left knee x2  . ORCHIECTOMY  1983    There were no vitals filed for this visit.  Subjective Assessment - 11/22/18 1510    Subjective  Patient reports that he had surgery on his right foot september 2019. He was in a cast 2 months, then a walking boot for 4 weeks. Now he is wearing a soft brace for lateral stabiity. Patient had a heart cath. last friday 11/09/18.    Pertinent History   Patient reports that he had surgery on his right foot september 2019. He was in a cast 2 months, then a walking boot for 4 weeks. Now he is wearing a soft brace for lateral stabiity. Patient had a heart cath. last friday 11/09/18.     Limitations  Walking;Standing    How long can you sit comfortably?  Not limited.     How long can you stand comfortably?  60-90 seconds.     How long can you walk comfortably?  15-30 minutes, limited by Right foot pain which has declined since starting PT.     Patient Stated Goals  I want to walk without a walker, improve endurance.    Currently in Pain?  Yes    Pain Score  3     Pain Location  Shoulder    Pain Orientation  Left    Pain Descriptors / Indicators  Aching    Pain Onset  In the past 7 days    Multiple Pain Sites  --   right wrist 3/10, low back 1/10      NEUROMUSCULAR RE-EDUCATION nu-step x 5 mins  Stretching  hamstrings bilaterally x 30 sec x 3 Stretching gastro/soleus bilaterally x 30 sec x 3  SLR with intervals 30 deg , 60 deg x 15 BLE Leg press single leg L x 75 lbs x 20 x 2  Step ups to 6 inch stool BLE x 20  Backward steps to 6 inch stool x 20  Gait training with rollator with vc to tighten left knee in stance Patient needs cues for correct form and technique. Instructed in stretching for HEP Patient tolerates stretching and strengthening in closed chain and open chain positions with no increased pain following treatment.                         PT Education - 11/22/18 1511    Education provided  Yes    Education Details  HEP    Person(s) Educated  Patient    Methods  Explanation    Comprehension  Verbalized understanding       PT Short Term Goals - 11/14/17 0848      PT SHORT TERM GOAL #1   Title  Pt will be able  to perform sit <> stand transfer from a regular chair independently without UE assist to promote function.     Baseline  needs UE support    Time  4    Period  Weeks    Status  New    Target Date  12/11/17      PT SHORT TERM GOAL #2   Title  Patient will be able to stand at counter for 15 minutes to be able to complete a standing activity    Baseline  Patient has limited standing time with UE support and AD    Time  4    Period  Weeks    Status  New    Target Date  12/11/17        PT Long Term Goals - 11/15/18 0818      PT LONG TERM GOAL #1   Title  Patient will be independent in home exercise program to improve strength/mobility for better functional independence with ADLs    Baseline  --    Time  8    Period  Weeks    Status  New    Target Date  01/09/19      PT LONG TERM GOAL #2   Title  Patient will ascend/descend 4 stairs with rail assist independently and ability to bring his rollator to the top or bottom of the steps , without loss of balance to improve ability to get in/out of home    Baseline  Patient is able to ascend  and descend steps with railing and CGA    Time  8    Period  Weeks    Status  New    Target Date  01/09/19      PT LONG TERM GOAL #3   Title  Patient will increase 10 meter walk test to >1.65m/s as to improve gait speed for better community ambulation and to reduce fall risk.    Baseline  --    Time  8    Period  Weeks    Status  New    Target Date  01/09/19      PT LONG TERM GOAL #4   Title  Patient (> 59 years old) will complete five times sit to stand test in < 15 seconds indicating an increased LE strength and improved balance.    Baseline  --    Time  8    Period  Weeks    Status  New    Target Date  01/09/19      PT LONG TERM GOAL #5   Title  Patient will ambulate 50 feet indoors with spc without falls or using the furniture for support.    Baseline  --    Time  8    Period  Weeks    Status  New    Target Date  01/09/19            Plan - 11/22/18 1512    Clinical Impression Statement  Pt requires direction and verbal cues for correct performance of exercises. Patient demonstrates weakness in BLE and performs open and closed chain exercises with no reports of pain . Pt was able to perform all exercises with mod assist and VC for technique. Patient struggles with speed and control of movement during as well as balance with unstable surfaces.  Pt encouraged continuing  HEP.    Rehab Potential  Good    Clinical Impairments Affecting Rehab Potential  chronicity of condition, Parkinson's disease,  TKR BLE    PT Frequency  2x / week    PT Duration  8 weeks    PT Treatment/Interventions  Neuromuscular re-education;Manual techniques;Therapeutic exercise;Therapeutic activities;Aquatic Therapy;Gait training;Patient/family education;Cryotherapy;Stair training;Balance training;Dry needling;Taping    PT Next Visit Plan  Knee extension ROM, gait, functional strengthening, balance training    PT Home Exercise Plan  Update HEP next visit.     Consulted and Agree with Plan of Care   Patient       Patient will benefit from skilled therapeutic intervention in order to improve the following deficits and impairments:  Pain, Postural dysfunction, Improper body mechanics, Difficulty walking, Decreased strength, Decreased range of motion, Abnormal gait, Decreased balance, Decreased endurance, Decreased mobility, Impaired sensation, Decreased activity tolerance, Impaired flexibility  Visit Diagnosis: Difficulty in walking, not elsewhere classified  Chronic pain of right knee  Muscle weakness (generalized)     Problem List Patient Active Problem List   Diagnosis Date Noted  . Essential hypertension, benign 09/15/2018  . Dyslipidemia 09/15/2018  . Hypogonadism in male 06/18/2018  . Testicular cancer ([place]) 06/13/2018  . Hyperlipidemia 06/13/2018  . Depression 06/13/2018  . Anxiety 06/13/2018  . Callus of foot 03/27/2018  . Pes planovalgus, acquired, right 01/30/2018  . Arthritis of midfoot 01/30/2018  . Achilles tendon contracture, right 01/30/2018  . Left shoulder pain 07/26/2017  . Infection of prosthetic left knee joint ([place]) 07/10/2017  . 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[place]) 05/15/2017  . GERD (gastroesophageal reflux disease) 03/20/2017  . Primary osteoarthritis of one knee, right 12/30/2016  . H/O total knee replacement, left 12/30/2016  . Moderate recurrent major depression ([place]) 08/15/2016  . Chest pain due to myocardial ischemia 08/14/2016  . Chest pain, rule out acute myocardial infarction 08/14/2016  . Chest x-ray abnormality 10/17/2013  . Scoliosis 07/11/2012  . Arthritis of wrist, left, degenerative 07/11/2012  . Parkinson's disease ([place]) 01/07/2012    Alanson Puls, PT DPT 11/22/2018, 3:14 PM  [place] MAIN [facility] SERVICES [address] [place], [place], [address] Phone: [phone]   Fax:  [phone]  Name: Charles Choi MRN: [id number] Date of Birth: [birth year]

## 2018-11-26 ENCOUNTER — Ambulatory Visit: Payer: Medicare Other

## 2018-11-26 DIAGNOSIS — M25561 Pain in right knee: Secondary | ICD-10-CM

## 2018-11-26 DIAGNOSIS — M6281 Muscle weakness (generalized): Secondary | ICD-10-CM

## 2018-11-26 DIAGNOSIS — R262 Difficulty in walking, not elsewhere classified: Secondary | ICD-10-CM

## 2018-11-26 DIAGNOSIS — G8929 Other chronic pain: Secondary | ICD-10-CM

## 2018-11-26 NOTE — Therapy (Signed)
[place] MAIN [facility] SERVICES [address] [place], [place], [address] Phone: [phone]   Fax:  [phone]  Physical Therapy Treatment  Patient Details  Name: Charles Choi MRN: [id number] Date of Birth: 04-10-1951 Referring Provider (PT): Fulton Reek    Encounter Date: 11/26/2018  PT End of Session - 11/26/18 1525    Visit Number  3    Number of Visits  17    Date for PT Re-Evaluation  01/02/19    Authorization Type  Medicare     Authorization Time Period  04/25/18-06/21/18    PT Start Time  1515    PT Stop Time  1600   PT Time Calculation (min)  40 min    Equipment Utilized During Treatment  Gait belt    Activity Tolerance  No increased pain;Patient tolerated treatment well    Behavior During Therapy  [facility] for tasks assessed/performed       Past Medical History:  Diagnosis Date  . Anxiety   . Cancer ([place])   . Depression   . Hypertension   . Parkinson's disease [facility])     Past Surgical History:  Procedure Laterality Date  . BACK SURGERY    . CHOLECYSTECTOMY    . DEEP BRAIN STIMULATOR PLACEMENT     for Parkinson's disease  . JOINT REPLACEMENT     left knee x2  . ORCHIECTOMY  1983    There were no vitals filed for this visit.  Subjective Assessment - 11/26/18 1522    Subjective  Pt doing alrght today, reports conitnued pain in Left shoulder and Right wrist. He denies pain in ankle. Reports HEP is going well.     Pertinent History   Patient reports that he had surgery on his right foot september 2019. He was in a cast 2 months, then a walking boot for 4 weeks. Now he is wearing a soft brace for lateral stabiity. Patient had a heart cath. last friday 11/09/18.     Currently in Pain?  Yes    Pain Score  1     Pain Location  --   Right wrist, Left shoulder    Pain Orientation  Left    Pain Descriptors / Indicators  Aching       Therapeutic Activity  -nu-step x 5 mins, level 3 (avoids LUE use)  -Supine Left knee Quads  set into towel roll, 1x15x3secH -[place] LAQ hamstrings stretch 15x3secH -Stretching hamstrings bilaterally x 30 sec x3 -Stretching gastro/soleus bilaterally (deferred as patient has hypermobility into dorsiflexion)  -Glute max Bridge 1x12  -SLR 1x15 BLE -Step ups to 6-inch step right, 4-inch step left 1x10 bilat, BUE support  Unable to get to for time:  -Leg press single leg L x 75 lbs x 20 x 2    Patient needs cues for correct form and technique. Instructed in stretching for HEP Patient tolerates stretching and strengthening in closed chain and open chain positions with no increased pain following treatment    PT Short Term Goals - 11/14/17 0848      PT SHORT TERM GOAL #1   Title  Pt will be able to perform sit <> stand transfer from a regular chair independently without UE assist to promote function.     Baseline  needs UE support    Time  4    Period  Weeks    Status  New    Target Date  12/11/17      PT SHORT TERM  GOAL #2   Title  Patient will be able to stand at counter for 15 minutes to be able to complete a standing activity    Baseline  Patient has limited standing time with UE support and AD    Time  4    Period  Weeks    Status  New    Target Date  12/11/17        PT Long Term Goals - 11/15/18 0818      PT LONG TERM GOAL #1   Title  Patient will be independent in home exercise program to improve strength/mobility for better functional independence with ADLs    Baseline  --    Time  8    Period  Weeks    Status  New    Target Date  01/09/19      PT LONG TERM GOAL #2   Title  Patient will ascend/descend 4 stairs with rail assist independently and ability to bring his rollator to the top or bottom of the steps , without loss of balance to improve ability to get in/out of home    Baseline  Patient is able to ascend and descend steps with railing and CGA    Time  8    Period  Weeks    Status  New    Target Date  01/09/19      PT LONG TERM GOAL #3   Title   Patient will increase 10 meter walk test to >1.3m/s as to improve gait speed for better community ambulation and to reduce fall risk.    Baseline  --    Time  8    Period  Weeks    Status  New    Target Date  01/09/19      PT LONG TERM GOAL #4   Title  Patient (> 57 years old) will complete five times sit to stand test in < 15 seconds indicating an increased LE strength and improved balance.    Baseline  --    Time  8    Period  Weeks    Status  New    Target Date  01/09/19      PT LONG TERM GOAL #5   Title  Patient will ambulate 50 feet indoors with spc without falls or using the furniture for support.    Baseline  --    Time  8    Period  Weeks    Status  New    Target Date  01/09/19            Plan - 11/26/18 1528    Clinical Impression Statement  Pt tolerating session well this date, minimal rest breaks require due to fatigue (similar ot prior sessions). Attempted to teach some of the stretches with independence. Pt making progress thus far, but continues to remain somewhat weak and limited. ROM deficits noted in Lt knee as chronic, addressed mobility restrictions from a capsular and muscular stand point. Functional strength a balance continue to improve overall.    Rehab Potential  Good    Clinical Impairments Affecting Rehab Potential  chronicity of condition, Parkinson's disease, TKR BLE    PT Frequency  2x / week    PT Duration  8 weeks    PT Treatment/Interventions  Neuromuscular re-education;Manual techniques;Therapeutic exercise;Therapeutic activities;Aquatic Therapy;Gait training;Patient/family education;Cryotherapy;Stair training;Balance training;Dry needling;Taping    PT Next Visit Plan  Knee extension ROM, gait, functional strengthening, balance training    PT Home Exercise Plan  Update HEP next visit.     Consulted and Agree with Plan of Care  Patient       Patient will benefit from skilled therapeutic intervention in order to improve the following deficits  and impairments:  Pain, Postural dysfunction, Improper body mechanics, Difficulty walking, Decreased strength, Decreased range of motion, Abnormal gait, Decreased balance, Decreased endurance, Decreased mobility, Impaired sensation, Decreased activity tolerance, Impaired flexibility  Visit Diagnosis: Difficulty in walking, not elsewhere classified  Chronic pain of right knee  Muscle weakness (generalized)     Problem List Patient Active Problem List   Diagnosis Date Noted  . Essential hypertension, benign 09/15/2018  . Dyslipidemia 09/15/2018  . Hypogonadism in male 06/18/2018  . Testicular cancer ([place]) 06/13/2018  . Hyperlipidemia 06/13/2018  . Depression 06/13/2018  . Anxiety 06/13/2018  . Callus of foot 03/27/2018  . Pes planovalgus, acquired, right 01/30/2018  . Arthritis of midfoot 01/30/2018  . Achilles tendon contracture, right 01/30/2018  . Left shoulder pain 07/26/2017  . Infection of prosthetic left knee joint ([place]) 07/10/2017  . 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[place]) 05/15/2017  . GERD (gastroesophageal reflux disease) 03/20/2017  . Primary osteoarthritis of one knee, right 12/30/2016  . H/O total knee replacement, left 12/30/2016  . Moderate recurrent major depression ([place]) 08/15/2016  . Chest pain due to myocardial ischemia 08/14/2016  . Chest pain, rule out acute myocardial infarction 08/14/2016  . Chest x-ray abnormality 10/17/2013  . Scoliosis 07/11/2012  . Arthritis of wrist, left, degenerative 07/11/2012  . Parkinson's disease ([place]) 01/07/2012   4:00 PM, 11/26/18 Etta Grandchild, PT, DPT Physical Therapist - [facility]  Outpatient Physical Therapy- [place] [phone]     Etta Grandchild 11/26/2018, 3:40 PM  [place] MAIN [facility] SERVICES [address] [place], [place], [address] Phone: [phone]   Fax:  [phone]  Name: Charles Choi MRN: [id number] Date of  Birth: Dec 22, 1950

## 2018-11-28 ENCOUNTER — Ambulatory Visit: Payer: Medicare Other | Admitting: Physical Therapy

## 2018-11-28 DIAGNOSIS — R262 Difficulty in walking, not elsewhere classified: Secondary | ICD-10-CM | POA: Diagnosis not present

## 2018-11-28 DIAGNOSIS — M6281 Muscle weakness (generalized): Secondary | ICD-10-CM

## 2018-11-28 DIAGNOSIS — G8929 Other chronic pain: Secondary | ICD-10-CM

## 2018-11-28 DIAGNOSIS — M25561 Pain in right knee: Secondary | ICD-10-CM

## 2018-11-28 NOTE — Therapy (Signed)
[place] MAIN [facility] SERVICES [address] [place], [place], [address] Phone: [phone]   Fax:  [phone]  Physical Therapy Treatment  Patient Details  Name: Charles Choi MRN: [id number] Date of Birth: [birth year] Referring Provider (PT): Fulton Reek    Encounter Date: 11/28/2018  PT End of Session - 11/28/18 1555    Visit Number  4    Number of Visits  17    Date for PT Re-Evaluation  01/02/19    Authorization Type  Medicare     Authorization Time Period  04/25/18-06/21/18    PT Start Time  0315    PT Stop Time  0400    PT Time Calculation (min)  45 min    Equipment Utilized During Treatment  Gait belt    Activity Tolerance  No increased pain;Patient tolerated treatment well    Behavior During Therapy  [facility] for tasks assessed/performed       Past Medical History:  Diagnosis Date  . Anxiety   . Cancer ([place])   . Depression   . Hypertension   . Parkinson's disease [facility])     Past Surgical History:  Procedure Laterality Date  . BACK SURGERY    . CHOLECYSTECTOMY    . DEEP BRAIN STIMULATOR PLACEMENT     for Parkinson's disease  . JOINT REPLACEMENT     left knee x2  . ORCHIECTOMY  1983    There were no vitals filed for this visit.  Subjective Assessment - 11/28/18 1545    Subjective  Patient continues to have left shoulder pain .     Pertinent History   Patient reports that he had surgery on his right foot september 2019. He was in a cast 2 months, then a walking boot for 4 weeks. Now he is wearing a soft brace for lateral stabiity. Patient had a heart cath. last friday 11/09/18.     Limitations  Walking;Standing    How long can you sit comfortably?  Not limited.     How long can you stand comfortably?  60-90 seconds.     How long can you walk comfortably?  15-30 minutes, limited by Right foot pain which has declined since starting PT.     Patient Stated Goals  I want to walk without a walker, improve endurance.     Currently in Pain?  Yes    Pain Score  1     Pain Location  Shoulder    Pain Orientation  Left    Pain Descriptors / Indicators  Aching    Pain Onset  In the past 7 days    Multiple Pain Sites  No       Treatment: Nu-step x 5 mins Matrix fwd/bwd x 5 reps with 22. 5 lbs and rollator and min assist Standing in parallel bars and lunges into BOSU ball x 15 BLE Standing on foam one foot and 6 inch stool one foot and head turns , unable to stand with L leg behind Tapping 6 inch stool x 20 alternating LE Stepping over hurdle fwd/ bwd x 20 , cGA  Supine bridges x 20  Supine hip abd/ER with GTB x 20  Hamstring stretches x 30 sec x 3 BLE  CGA and Min to mod verbal cues used throughout with increased in postural sway and LOB most seen with narrow base of support and while on uneven surfaces. Continues to have balance deficits typical with diagnosis. Patient is able to complete all exercises  as instructed but requires intermittent rest breaks. Fatigue monitored during session                       PT Education - 11/28/18 1547    Education provided  Yes    Education Details  HEP    Person(s) Educated  Patient    Methods  Explanation    Comprehension  Returned demonstration       PT Short Term Goals - 11/14/17 0848      PT SHORT TERM GOAL #1   Title  Pt will be able to perform sit <> stand transfer from a regular chair independently without UE assist to promote function.     Baseline  needs UE support    Time  4    Period  Weeks    Status  New    Target Date  12/11/17      PT SHORT TERM GOAL #2   Title  Patient will be able to stand at counter for 15 minutes to be able to complete a standing activity    Baseline  Patient has limited standing time with UE support and AD    Time  4    Period  Weeks    Status  New    Target Date  12/11/17        PT Long Term Goals - 11/15/18 0818      PT LONG TERM GOAL #1   Title  Patient will be independent in home  exercise program to improve strength/mobility for better functional independence with ADLs    Baseline  --    Time  8    Period  Weeks    Status  New    Target Date  01/09/19      PT LONG TERM GOAL #2   Title  Patient will ascend/descend 4 stairs with rail assist independently and ability to bring his rollator to the top or bottom of the steps , without loss of balance to improve ability to get in/out of home    Baseline  Patient is able to ascend and descend steps with railing and CGA    Time  8    Period  Weeks    Status  New    Target Date  01/09/19      PT LONG TERM GOAL #3   Title  Patient will increase 10 meter walk test to >1.61m/s as to improve gait speed for better community ambulation and to reduce fall risk.    Baseline  --    Time  8    Period  Weeks    Status  New    Target Date  01/09/19      PT LONG TERM GOAL #4   Title  Patient (> 58 years old) will complete five times sit to stand test in < 15 seconds indicating an increased LE strength and improved balance.    Baseline  --    Time  8    Period  Weeks    Status  New    Target Date  01/09/19      PT LONG TERM GOAL #5   Title  Patient will ambulate 50 feet indoors with spc without falls or using the furniture for support.    Baseline  --    Time  8    Period  Weeks    Status  New    Target Date  01/09/19  Plan - 11/28/18 1555    Clinical Impression Statement  Patient performs standing balance training and strengthening in open and closed chain positions with no pain behaviors. He will conitnue to benefit from skilled PT to improve mobiity and safety.     Rehab Potential  Good    Clinical Impairments Affecting Rehab Potential  chronicity of condition, Parkinson's disease, TKR BLE    PT Frequency  2x / week    PT Duration  8 weeks    PT Treatment/Interventions  Neuromuscular re-education;Manual techniques;Therapeutic exercise;Therapeutic activities;Aquatic Therapy;Gait training;Patient/family  education;Cryotherapy;Stair training;Balance training;Dry needling;Taping    PT Next Visit Plan  Knee extension ROM, gait, functional strengthening, balance training    PT Home Exercise Plan  Update HEP next visit.     Consulted and Agree with Plan of Care  Patient       Patient will benefit from skilled therapeutic intervention in order to improve the following deficits and impairments:  Pain, Postural dysfunction, Improper body mechanics, Difficulty walking, Decreased strength, Decreased range of motion, Abnormal gait, Decreased balance, Decreased endurance, Decreased mobility, Impaired sensation, Decreased activity tolerance, Impaired flexibility  Visit Diagnosis: Difficulty in walking, not elsewhere classified  Chronic pain of right knee  Muscle weakness (generalized)     Problem List Patient Active Problem List   Diagnosis Date Noted  . Essential hypertension, benign 09/15/2018  . Dyslipidemia 09/15/2018  . Hypogonadism in male 06/18/2018  . Testicular cancer ([place]) 06/13/2018  . Hyperlipidemia 06/13/2018  . Depression 06/13/2018  . Anxiety 06/13/2018  . Callus of foot 03/27/2018  . Pes planovalgus, acquired, right 01/30/2018  . Arthritis of midfoot 01/30/2018  . Achilles tendon contracture, right 01/30/2018  . Left shoulder pain 07/26/2017  . Infection of prosthetic left knee joint ([place]) 07/10/2017  . 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[place]) 05/15/2017  . GERD (gastroesophageal reflux disease) 03/20/2017  . Primary osteoarthritis of one knee, right 12/30/2016  . H/O total knee replacement, left 12/30/2016  . Moderate recurrent major depression ([place]) 08/15/2016  . Chest pain due to myocardial ischemia 08/14/2016  . Chest pain, rule out acute myocardial infarction 08/14/2016  . Chest x-ray abnormality 10/17/2013  . Scoliosis 07/11/2012  . Arthritis of wrist, left, degenerative 07/11/2012  . Parkinson's disease ([place]) 01/07/2012    Alanson Puls, PT DPT   11/28/2018, 4:09 PM  [place] MAIN [facility] SERVICES [address] [place], [place], [address] Phone: [phone]   Fax:  [phone]  Name: Charles Choi MRN: [id number] Date of Birth: [birth year]

## 2018-12-03 ENCOUNTER — Encounter: Payer: Medicare Other | Admitting: Physical Therapy

## 2018-12-05 ENCOUNTER — Encounter: Payer: Medicare Other | Admitting: Physical Therapy

## 2018-12-05 DIAGNOSIS — R7309 Other abnormal glucose: Secondary | ICD-10-CM | POA: Insufficient documentation

## 2018-12-07 ENCOUNTER — Ambulatory Visit: Payer: Medicare Other | Admitting: Occupational Therapy

## 2018-12-07 ENCOUNTER — Encounter: Payer: Self-pay | Admitting: Occupational Therapy

## 2018-12-07 DIAGNOSIS — R2681 Unsteadiness on feet: Secondary | ICD-10-CM

## 2018-12-07 DIAGNOSIS — R262 Difficulty in walking, not elsewhere classified: Secondary | ICD-10-CM

## 2018-12-07 DIAGNOSIS — M6281 Muscle weakness (generalized): Secondary | ICD-10-CM

## 2018-12-07 DIAGNOSIS — R278 Other lack of coordination: Secondary | ICD-10-CM

## 2018-12-07 NOTE — Therapy (Signed)
[place] MAIN [facility] SERVICES [address] [place], [place], [address] Phone: [phone]   Fax:  [phone]  Occupational Therapy Evaluation  Patient Details  Name: Charles Choi MRN: [id number] Date of Birth: 1951-08-24 No data recorded  Encounter Date: 12/07/2018  OT End of Session - 12/07/18 1724    Visit Number  1    Number of Visits  17    Date for OT Re-Evaluation  01/11/19    Authorization Type  Medicare    OT Start Time  1100    OT Stop Time  1158    OT Time Calculation (min)  58 min    Activity Tolerance  Patient tolerated treatment well;Patient limited by fatigue    Behavior During Therapy  [facility] for tasks assessed/performed       Past Medical History:  Diagnosis Date  . Anxiety   . Cancer ([place])   . Depression   . Hypertension   . Parkinson's disease [facility])     Past Surgical History:  Procedure Laterality Date  . BACK SURGERY    . CHOLECYSTECTOMY    . DEEP BRAIN STIMULATOR PLACEMENT     for Parkinson's disease  . JOINT REPLACEMENT     left knee x2  . ORCHIECTOMY  1983    There were no vitals filed for this visit.  Subjective Assessment - 12/07/18 1110    Subjective   Patient reports he is really having a hard time with movement today and had to be brought to the clinic in a transport chair.     Pertinent History  Patient reports he was diagnosed 40 years ago with Parkinson's at the age of 57 and has been a natural progression, DBS Aug 2006 with dramatic changes, has it bilaterally.  Involuntary movements worse on left than right.     Patient Stated Goals  Patient would like to be able to sustain his independence as long as possible.      Currently in Pain?  Yes    Pain Score  2     Pain Location  Wrist    Pain Orientation  Right    Pain Descriptors / Indicators  Aching    Pain Type  Chronic pain    Pain Onset  More than a month ago    Pain Frequency  Intermittent    Multiple Pain Sites  No        [facility] OT  Assessment - 12/07/18 1120      Assessment   Medical Diagnosis  ataxia     Hand Dominance  Right    Prior Therapy  Patient had outpatient therapy in july following his knee surgery       Precautions   Precautions  Fall      Balance Screen   Has the patient fallen in the past 6 months  Yes    How many times?  2    Has the patient had a decrease in activity level because of a fear of falling?   Yes    Is the patient reluctant to leave their home because of a fear of falling?   No      Home  Environment   Family/patient expects to be discharged to:  Private residence    Living Arrangements  Alone    Available Help at Discharge  Family    Type of [place]  Lives With  Alone      Prior Function   Level of Independence  Independent with basic ADLs    Vocation  Retired    Biomedical scientist  PLOF used a RW    Leisure  play with grandson      ADL   Eating/Feeding  Modified independent    Grooming  Modified independent    Upper Body Bathing  Modified independent    Lower Body Bathing  Modified independent    Upper Body Dressing  Increased time;Needs assist for fasteners    Lower Body Dressing  Increased time    Toilet Transfer  Modified independent    Toileting - Clothing Manipulation  Modified independent;Increased time    Toileting -  Hygiene  Increase time;Modified Independent      IADL   Prior Level of Function Shopping  independent    Shopping  Shops independently for small purchases    Prior Level of Function Light Housekeeping  independent    Light Housekeeping  Performs light daily tasks such as dishwashing, bed making;Needs help with all home maintenance tasks    Prior Level of Function Meal Prep  independent    Prior Level of Function Medication Managment  independent     Medication Management  Is responsible for taking medication in correct dosages at correct time    Prior Level of Function Financial  Management  independent    Psychiatrist financial matters independently (budgets, writes checks, pays rent, bills goes to bank), collects and keeps track of income      Mobility   Mobility Status  History of falls      Written Expression   Dominant Hand  Right      Vision - History   Baseline Vision  Wears glasses only for reading      Cognition   Overall Cognitive Status  Within Functional Limits for tasks assessed      Coordination   9 Hole Peg Test  Right;Left      ROM / Strength   AROM / PROM / Strength  AROM;Strength      Hand Function   Right Hand Grip (lbs)  35    Right Hand Lateral Pinch  12 lbs    Right Hand 3 Point Pinch  9 lbs    Left Hand Grip (lbs)  30    Left Hand Lateral Pinch  12 lbs    Left 3 point pinch  14 lbs    Comment  right 2 point 6#, left 14#         Patient reports five past surgeries on his left knee and had a right knee replacement in June 2018. He also had a right foot operation in September 2019 for a hammertoe and to fix the arch of his foot. He reports a total of 17 operations in his history and current need for a left shoulder replacement. Patient lives in a two-story house with a basement however all of his needs are met on the main floor. He is recently divorced and has a supportive brother and sister who comes to take him to his appointments. He has a tub bench with 2 grab bars and one grab bar by his toilet which is handicapped accessible. He has a cane, Rollator, wheelchair, as well as a Museum/gallery conservator long handled sponge and a sock aid.   He has difficulty with cutting food, managing buttons, tying shoes at times, homemaking task, standing at the stove,  opening jars and containers, strength to open the oven door. He uses a rollator at all times in the home and outside in the community and has increased difficulty with functional mobility in the past month. He reports he is starting to have more freezing of gait episodes  which last at least 3 to 10 seconds and occur around one time a day. Active range of motion for shoulder flexion right 145 left 90. Abduction right 160 left 90. Patient has full elbow extension on the right side and full flexion but difficulty with flexion on the left side.  Coordination impaired with rapid alternating movements worse on the left and right, finger to nose right intact.  Left side he was unable to complete due to shoulder range of motion.  Nine hole peg test was completed on the left with the board in his lap due to limitations in range of motion. In writing demonstrates poor legibility and micrographia.   Patient was unable to perform six minute walk test this date.  5 times sit to stand 17 secs                   OT Long Term Goals - 12/07/18 1724      OT LONG TERM GOAL #1   Title  Patient will complete HEP for maximal daily exercises with modified independence in 4 weeks    Baseline  no current program at eval    Time  4    Period  Weeks    Status  New    Target Date  01/11/19      OT LONG TERM GOAL #2   Title  Patient will transfer from sit to stand without the use of arms safely and independently from a variety of lower chairs/surfaces in 4 weeks.    Baseline  difficulty with low surfaces    Time  4    Period  Weeks    Status  New    Target Date  01/11/19      OT LONG TERM GOAL #3   Title  Patient will demonstrate decreased freezing of gait behaviors with score of less than 10 on freezing of gait questionnaire.    Baseline  score of 12 at eval    Time  4    Period  Weeks    Status  New    Target Date  01/11/19      OT LONG TERM GOAL #4   Title  Patient will improve gait speed and endurance and be able to walk 525 feet in 6 minutes to negotiate around the home and community safely in 4 weeks    Baseline  Unable to perform 6 min test at eval, performed with PT 430 feet on 11/14/2018    Time  4    Period  Weeks    Status  New    Target  Date  01/11/19      OT LONG TERM GOAL #5   Title  Patient will complete buttons with modified independence     Baseline  increased difficulty at eval    Time  4    Period  Weeks    Status  New    Target Date  01/11/19            Plan - 12/07/18 1722    Clinical Impression Statement  Patient is an 68 yo male diagnosed with Parkinson's disease and was referred by his physician for LSVT BIG program. Patient presents with  difficulty walking, history of falls, freezing of gait behaviors, decreased step length with gait patterns, short/shuffling gait, decreased balance, decreased gross motor and fine motor coordination, dyskinesia and tremors which affect his ability to engage in daily tasks and has affected his ability to perform ADL and IADL tasks.  Patient lives alone.  The patient is judged to be a good candidate for the LSVT BIG program. He would benefit from and was referred for the LSVT BIG program which is an intensive program designed specifically for Parkinson's patients with a focus on increasing amplitude and speed of movements, improving self-care and daily tasks and providing patients with daily exercises to improve overall function. It is recommended that the patient receive the LSVT BIG program which is comprised of 16 intensive sessions (4 times per week for 4 weeks, one hour sessions). Prognosis for improvement is good based on his motivation and family support. LSVT BIG has been documented in the literature as efficacious for individuals with Parkinson's disease.    Occupational Profile and client history currently impacting functional performance  history of 17 surgeries, multiple surgeries to both knees, recent foot surgery, lives alone, deep brain stimulator bilaterally. multiple comorbidities    Occupational performance deficits (Please refer to evaluation for details):  ADL's;IADL's;Leisure;Social Participation    Rehab Potential  Good    Current Impairments/barriers affecting  progress:  recent decline in functional mobility, recent divorce, lives alone and needs to remain independent, history of falls    OT Frequency  4x / week    OT Duration  4 weeks    OT Treatment/Interventions  Self-care/ADL training;Therapeutic exercise;DME and/or AE instruction;Functional Mobility Training;Balance training;Neuromuscular education;Gait Training;Manual Therapy;Moist Heat;Energy conservation;Therapeutic activities;Patient/family education    Clinical Decision Making  Several treatment options, min-mod task modification necessary    Consulted and Agree with Plan of Care  Patient       Patient will benefit from skilled therapeutic intervention in order to improve the following deficits and impairments:  Abnormal gait, Decreased balance, Decreased endurance, Decreased mobility, Difficulty walking, Decreased range of motion, Decreased coordination, Decreased strength, Impaired flexibility, Impaired UE functional use  Visit Diagnosis: Difficulty in walking, not elsewhere classified  Muscle weakness (generalized)  Other lack of coordination  Unsteadiness on feet    Problem List Patient Active Problem List   Diagnosis Date Noted  . Essential hypertension, benign 09/15/2018  . Dyslipidemia 09/15/2018  . Hypogonadism in male 06/18/2018  . Testicular cancer ([place]) 06/13/2018  . Hyperlipidemia 06/13/2018  . Depression 06/13/2018  . Anxiety 06/13/2018  . Callus of foot 03/27/2018  . Pes planovalgus, acquired, right 01/30/2018  . Arthritis of midfoot 01/30/2018  . Achilles tendon contracture, right 01/30/2018  . Left shoulder pain 07/26/2017  . Infection of prosthetic left knee joint ([place]) 07/10/2017  . 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[place]) 05/15/2017  . GERD (gastroesophageal reflux disease) 03/20/2017  . Primary osteoarthritis of one knee, right 12/30/2016  . H/O total knee replacement, left 12/30/2016  . Moderate recurrent major depression ([place]) 08/15/2016  . Chest  pain due to myocardial ischemia 08/14/2016  . Chest pain, rule out acute myocardial infarction 08/14/2016  . Chest x-ray abnormality 10/17/2013  . Scoliosis 07/11/2012  . Arthritis of wrist, left, degenerative 07/11/2012  . Parkinson's disease ([place]) 01/07/2012   Fahmida Jurich T Tomasita Morrow, OTR/L, CLT  Damyah Gugel 12/07/2018, 5:48 PM  [place] MAIN [facility] SERVICES [address] [place], [place], [address] Phone: [phone]   Fax:  [phone]  Name: Charles Choi  Charles Choi MRN: [id number] Date of Birth: Aug 08, 1951

## 2018-12-10 ENCOUNTER — Ambulatory Visit: Payer: Medicare Other | Attending: Internal Medicine | Admitting: Occupational Therapy

## 2018-12-10 ENCOUNTER — Encounter: Payer: Medicare Other | Admitting: Physical Therapy

## 2018-12-10 DIAGNOSIS — M25561 Pain in right knee: Secondary | ICD-10-CM | POA: Insufficient documentation

## 2018-12-10 DIAGNOSIS — G8929 Other chronic pain: Secondary | ICD-10-CM | POA: Diagnosis present

## 2018-12-10 DIAGNOSIS — R278 Other lack of coordination: Secondary | ICD-10-CM | POA: Diagnosis present

## 2018-12-10 DIAGNOSIS — M6281 Muscle weakness (generalized): Secondary | ICD-10-CM | POA: Diagnosis present

## 2018-12-10 DIAGNOSIS — R2681 Unsteadiness on feet: Secondary | ICD-10-CM | POA: Insufficient documentation

## 2018-12-10 DIAGNOSIS — R262 Difficulty in walking, not elsewhere classified: Secondary | ICD-10-CM | POA: Insufficient documentation

## 2018-12-11 ENCOUNTER — Ambulatory Visit: Payer: Medicare Other | Admitting: Occupational Therapy

## 2018-12-11 DIAGNOSIS — R278 Other lack of coordination: Secondary | ICD-10-CM

## 2018-12-11 DIAGNOSIS — R262 Difficulty in walking, not elsewhere classified: Secondary | ICD-10-CM | POA: Diagnosis not present

## 2018-12-11 DIAGNOSIS — M6281 Muscle weakness (generalized): Secondary | ICD-10-CM

## 2018-12-11 DIAGNOSIS — R2681 Unsteadiness on feet: Secondary | ICD-10-CM

## 2018-12-12 ENCOUNTER — Encounter: Payer: Self-pay | Admitting: Urology

## 2018-12-12 ENCOUNTER — Ambulatory Visit: Payer: Medicare Other | Admitting: Occupational Therapy

## 2018-12-12 ENCOUNTER — Ambulatory Visit: Payer: Self-pay | Admitting: Urology

## 2018-12-12 ENCOUNTER — Encounter: Payer: Medicare Other | Admitting: Physical Therapy

## 2018-12-12 DIAGNOSIS — M25561 Pain in right knee: Secondary | ICD-10-CM

## 2018-12-12 DIAGNOSIS — G8929 Other chronic pain: Secondary | ICD-10-CM

## 2018-12-12 DIAGNOSIS — R278 Other lack of coordination: Secondary | ICD-10-CM

## 2018-12-12 DIAGNOSIS — R2681 Unsteadiness on feet: Secondary | ICD-10-CM

## 2018-12-12 DIAGNOSIS — R262 Difficulty in walking, not elsewhere classified: Secondary | ICD-10-CM

## 2018-12-12 DIAGNOSIS — M6281 Muscle weakness (generalized): Secondary | ICD-10-CM

## 2018-12-13 ENCOUNTER — Ambulatory Visit: Payer: Medicare Other | Admitting: Occupational Therapy

## 2018-12-13 DIAGNOSIS — M6281 Muscle weakness (generalized): Secondary | ICD-10-CM

## 2018-12-13 DIAGNOSIS — R278 Other lack of coordination: Secondary | ICD-10-CM

## 2018-12-13 DIAGNOSIS — R262 Difficulty in walking, not elsewhere classified: Secondary | ICD-10-CM | POA: Diagnosis not present

## 2018-12-13 DIAGNOSIS — R2681 Unsteadiness on feet: Secondary | ICD-10-CM

## 2018-12-17 ENCOUNTER — Ambulatory Visit: Payer: Medicare Other | Admitting: Occupational Therapy

## 2018-12-17 ENCOUNTER — Encounter: Payer: Medicare Other | Admitting: Physical Therapy

## 2018-12-17 DIAGNOSIS — R2681 Unsteadiness on feet: Secondary | ICD-10-CM

## 2018-12-17 DIAGNOSIS — R278 Other lack of coordination: Secondary | ICD-10-CM

## 2018-12-17 DIAGNOSIS — R262 Difficulty in walking, not elsewhere classified: Secondary | ICD-10-CM

## 2018-12-17 DIAGNOSIS — M6281 Muscle weakness (generalized): Secondary | ICD-10-CM

## 2018-12-19 ENCOUNTER — Encounter: Payer: Self-pay | Admitting: Occupational Therapy

## 2018-12-19 ENCOUNTER — Encounter: Payer: Medicare Other | Admitting: Physical Therapy

## 2018-12-19 ENCOUNTER — Ambulatory Visit: Payer: Medicare Other | Admitting: Occupational Therapy

## 2018-12-19 DIAGNOSIS — R262 Difficulty in walking, not elsewhere classified: Secondary | ICD-10-CM | POA: Diagnosis not present

## 2018-12-19 DIAGNOSIS — R278 Other lack of coordination: Secondary | ICD-10-CM

## 2018-12-19 DIAGNOSIS — R2681 Unsteadiness on feet: Secondary | ICD-10-CM

## 2018-12-19 DIAGNOSIS — M6281 Muscle weakness (generalized): Secondary | ICD-10-CM

## 2018-12-19 NOTE — Therapy (Signed)
[place] MAIN [facility] SERVICES [address] [place], [place], [address] Phone: [phone]   Fax:  [phone]  Occupational Therapy Treatment  Patient Details  Name: Charles Choi MRN: [id number] Date of Birth: 01/15/1951 No data recorded  Encounter Date: 12/13/2018  OT End of Session - 12/19/18 2206    Visit Number  5    Number of Visits  17    Date for OT Re-Evaluation  01/11/19    Authorization Type  Medicare    OT Start Time  1303    OT Stop Time  1400    OT Time Calculation (min)  57 min    Activity Tolerance  Patient tolerated treatment well;Patient limited by fatigue    Behavior During Therapy  [facility] for tasks assessed/performed       Past Medical History:  Diagnosis Date  . Anxiety   . Cancer ([place])   . Depression   . Hypertension   . Parkinson's disease [facility])     Past Surgical History:  Procedure Laterality Date  . BACK SURGERY    . CHOLECYSTECTOMY    . DEEP BRAIN STIMULATOR PLACEMENT     for Parkinson's disease  . JOINT REPLACEMENT     left knee x2  . ORCHIECTOMY  1983    There were no vitals filed for this visit.  Subjective Assessment - 12/19/18 2159    Subjective   Patient reports he tried the exercises at home last night and felt it went well, performed from seated position and used papers to refer to    Pertinent History  Patient reports he was diagnosed 40 years ago with Parkinson's at the age of 43 and has been a natural progression, DBS Aug 2006 with dramatic changes, has it bilaterally.  Involuntary movements worse on left than right.     Patient Stated Goals  Patient would like to be able to sustain his independence as long as possible.      Currently in Pain?  No/denies    Pain Score  0-No pain      Neuromuscular reeducation:  Patient seen forinitialinstruction of LSVT BIG exercises: LSVT Daily Session Maximal Daily Exercises:  Sustained movements are designed to rescale the amplitude of movement  output for generalization  to daily functional activities. Performed as follows for 1 set of 10 repetitions each: Multi directional  sustained movements- 1) Floor to ceiling, 2) Side to side. Multi directional Repetitive movements  performed in standing and are designed to provide retraining effort needed for sustained muscle activation in tasks Performed as follows: 3) Step and reach forward, 4) Step and Reach Backwards,  5) Step and reach sideways, 6) Rock and reach forward/backward, 7) Rock and reach sideways.  Patient performed in the clinic from seated position with therapist cues and demonstration for proper form and technique.   Patient is able to demo understanding of first 2 exercises with decreased cues, other exercises require moderate cues.   Sit to stand with emphasis on controlling decent from standing to sitting.  Squats performed with therapist directing patient movements and working on depth of squat to improve performance with control.    Discussed functional component tasks and patient to think more over the weekend about the things he wants to focus on the most.  Will revisit next time and formulate list.    Patient seen for functional mobility for 2 trials of 150 feet with use of rollator and cues for increasing step length.  Patient continues to require increased assist with maximal daily exercises.  Will continue to work towards transitioning to higher level performance in standing as patient is able.                      OT Education - 12/19/18 2206    Education Details  LSVT BIG maximal daily exercises seated version, functional component tasks    Person(s) Educated  Patient    Methods  Explanation;Demonstration;Verbal cues;Handout    Comprehension  Verbalized understanding;Returned demonstration;Verbal cues required          OT Long Term Goals - 12/07/18 1724      OT LONG TERM GOAL #1   Title  Patient will complete HEP for maximal daily  exercises with modified independence in 4 weeks    Baseline  no current program at eval    Time  4    Period  Weeks    Status  New    Target Date  01/11/19      OT LONG TERM GOAL #2   Title  Patient will transfer from sit to stand without the use of arms safely and independently from a variety of lower chairs/surfaces in 4 weeks.    Baseline  difficulty with low surfaces    Time  4    Period  Weeks    Status  New    Target Date  01/11/19      OT LONG TERM GOAL #3   Title  Patient will demonstrate decreased freezing of gait behaviors with score of less than 10 on freezing of gait questionnaire.    Baseline  score of 12 at eval    Time  4    Period  Weeks    Status  New    Target Date  01/11/19      OT LONG TERM GOAL #4   Title  Patient will improve gait speed and endurance and be able to walk 525 feet in 6 minutes to negotiate around the home and community safely in 4 weeks    Baseline  Unable to perform 6 min test at eval, performed with PT 430 feet on 11/14/2018    Time  4    Period  Weeks    Status  New    Target Date  01/11/19      OT LONG TERM GOAL #5   Title  Patient will complete buttons with modified independence     Baseline  increased difficulty at eval    Time  4    Period  Weeks    Status  New    Target Date  01/11/19            Plan - 12/19/18 2207    Clinical Impression Statement  Patient continues to progress in all areas and is pleased with his progress this week.  Improved control with sit to stand and stand to sit with directed exercises.  Patient is progressing well with exercises from seated position and continues to work towards progressing to exercises in standing in adapted version.  Functional mobility  flucuates daily and patient still has some limitations from right foot and surgery months ago.  Continue to work towards goals in [place], formulate functional component tasks next session.      Occupational Profile and client history currently  impacting functional performance  history of 17 surgeries, multiple surgeries to both knees, recent foot surgery, lives alone, deep brain stimulator bilaterally. multiple comorbidities  Occupational performance deficits (Please refer to evaluation for details):  ADL's;IADL's;Leisure;Social Participation    Rehab Potential  Good    OT Frequency  4x / week    OT Duration  4 weeks    OT Treatment/Interventions  Self-care/ADL training;Therapeutic exercise;DME and/or AE instruction;Functional Mobility Training;Balance training;Neuromuscular education;Gait Training;Manual Therapy;Moist Heat;Energy conservation;Therapeutic activities;Patient/family education    Consulted and Agree with Plan of Care  Patient       Patient will benefit from skilled therapeutic intervention in order to improve the following deficits and impairments:  Abnormal gait, Decreased balance, Decreased endurance, Decreased mobility, Difficulty walking, Decreased range of motion, Decreased coordination, Decreased strength, Impaired flexibility, Impaired UE functional use  Visit Diagnosis: Difficulty in walking, not elsewhere classified  Muscle weakness (generalized)  Other lack of coordination  Unsteadiness on feet    Problem List Patient Active Problem List   Diagnosis Date Noted  . Essential hypertension, benign 09/15/2018  . Dyslipidemia 09/15/2018  . Hypogonadism in male 06/18/2018  . Testicular cancer ([place]) 06/13/2018  . Hyperlipidemia 06/13/2018  . Depression 06/13/2018  . Anxiety 06/13/2018  . Callus of foot 03/27/2018  . Pes planovalgus, acquired, right 01/30/2018  . Arthritis of midfoot 01/30/2018  . Achilles tendon contracture, right 01/30/2018  . Left shoulder pain 07/26/2017  . Infection of prosthetic left knee joint ([place]) 07/10/2017  . 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[place]) 05/15/2017  . GERD (gastroesophageal reflux disease) 03/20/2017  . Primary osteoarthritis of one knee, right 12/30/2016  .  H/O total knee replacement, left 12/30/2016  . Moderate recurrent major depression ([place]) 08/15/2016  . Chest pain due to myocardial ischemia 08/14/2016  . Chest pain, rule out acute myocardial infarction 08/14/2016  . Chest x-ray abnormality 10/17/2013  . Scoliosis 07/11/2012  . Arthritis of wrist, left, degenerative 07/11/2012  . Parkinson's disease ([place]) 01/07/2012   Amario Longmore T Tomasita Morrow, OTR/L, CLT  Eldana Isip 12/19/2018, 10:21 PM  [place] MAIN [facility] SERVICES [address] [place], [place], [address] Phone: [phone]   Fax:  [phone]  Name: Charles Choi MRN: [id number] Date of Birth: 19-Jul-1951

## 2018-12-19 NOTE — Therapy (Signed)
[place] MAIN [facility] SERVICES [address] [place], [place], [address] Phone: [phone]   Fax:  [phone]  Occupational Therapy Treatment  Patient Details  Name: Charles Choi MRN: [id number] Date of Birth: 1951/02/24 No data recorded  Encounter Date: 12/12/2018  OT End of Session - 12/19/18 2039    Visit Number  4    Number of Visits  17    Date for OT Re-Evaluation  01/11/19    Authorization Type  Medicare    OT Start Time  1300    OT Stop Time  1400    OT Time Calculation (min)  60 min    Activity Tolerance  Patient tolerated treatment well;Patient limited by fatigue    Behavior During Therapy  [facility] for tasks assessed/performed       Past Medical History:  Diagnosis Date  . Anxiety   . Cancer ([place])   . Depression   . Hypertension   . Parkinson's disease [facility])     Past Surgical History:  Procedure Laterality Date  . BACK SURGERY    . CHOLECYSTECTOMY    . DEEP BRAIN STIMULATOR PLACEMENT     for Parkinson's disease  . JOINT REPLACEMENT     left knee x2  . ORCHIECTOMY  1983    There were no vitals filed for this visit.  Subjective Assessment - 12/19/18 2038    Subjective   Patient reports he is feeling better about his performance and exercises today.  "I am really hopeful this program will work for me."      Pertinent History  Patient reports he was diagnosed 40 years ago with Parkinson's at the age of 77 and has been a natural progression, DBS Aug 2006 with dramatic changes, has it bilaterally.  Involuntary movements worse on left than right.     Patient Stated Goals  Patient would like to be able to sustain his independence as long as possible.      Currently in Pain?  Yes    Pain Score  2     Pain Location  Knee    Pain Orientation  Right    Pain Descriptors / Indicators  Aching    Pain Type  Chronic pain    Pain Onset  More than a month ago    Pain Frequency  Intermittent    Multiple Pain Sites  No              Patient seen for initial instruction of LSVT BIG exercises: LSVT Daily Session Maximal Daily Exercises:  Sustained movements are designed to rescale the amplitude of movement output for generalization  to daily functional activities. Performed as follows for 1 set of 10 repetitions each: Multi directional  sustained movements- 1) Floor to ceiling, 2) Side to side. Multi directional Repetitive movements  performed in standing and are designed to provide retraining effort needed for sustained muscle activation in tasks Performed as follows: 3) Step and reach forward, 4) Step and Reach Backwards,  5) Step and reach sideways, 6) Rock and reach forward/backward, 7) Rock and reach sideways.    Patient unable to perform in a standard version. Patient has mod to max difficulty with adapted version of exercises and will not be able to perform safely at home with this version due to poor balance and weight shifting, worked on exercises 3-7 in standing with therapist assist.   Sit to stand for 2 sets of 5 repetitions, focused on control of decent back  to seated position.  Will need additional work in this area.  Functional mobility for 1 trial of 200 feet, next trial of 100 feet with rest breaks between trials.  Use of rollator and cues for step length.      Additional focus on seated version of exercises, issued written and pictoral handout.  Requires verbal cues and therapist demo for proper form and technique.  Attempted adapted exercises in standing with moderate assist for weight shifting and stepping patterns.  He has difficulty with weight shift to left to step with right foot which is his foot he had surgery on in the fall.  Continue to work to advance exercises and establish consistency with home program.             OT Education - 12/19/18 2039    Education Details  LSVT BIG maximal daily exercises seated version    Person(s) Educated  Patient    Methods   Explanation;Demonstration;Verbal cues;Handout    Comprehension  Verbalized understanding;Returned demonstration;Verbal cues required          OT Long Term Goals - 12/07/18 1724      OT LONG TERM GOAL #1   Title  Patient will complete HEP for maximal daily exercises with modified independence in 4 weeks    Baseline  no current program at eval    Time  4    Period  Weeks    Status  New    Target Date  01/11/19      OT LONG TERM GOAL #2   Title  Patient will transfer from sit to stand without the use of arms safely and independently from a variety of lower chairs/surfaces in 4 weeks.    Baseline  difficulty with low surfaces    Time  4    Period  Weeks    Status  New    Target Date  01/11/19      OT LONG TERM GOAL #3   Title  Patient will demonstrate decreased freezing of gait behaviors with score of less than 10 on freezing of gait questionnaire.    Baseline  score of 12 at eval    Time  4    Period  Weeks    Status  New    Target Date  01/11/19      OT LONG TERM GOAL #4   Title  Patient will improve gait speed and endurance and be able to walk 525 feet in 6 minutes to negotiate around the home and community safely in 4 weeks    Baseline  Unable to perform 6 min test at eval, performed with PT 430 feet on 11/14/2018    Time  4    Period  Weeks    Status  New    Target Date  01/11/19      OT LONG TERM GOAL #5   Title  Patient will complete buttons with modified independence     Baseline  increased difficulty at eval    Time  4    Period  Weeks    Status  New    Target Date  01/11/19            Plan - 12/19/18 2040    Clinical Impression Statement  Additional focus on seated version of exercises, issued written and pictoral handout.  Requires verbal cues and therapist demo for proper form and technique.  Attempted adapted exercises in standing with moderate assist for weight shifting and stepping patterns.  He has difficulty with weight shift to left to step with  right foot which is his foot he had surgery on in the fall.  Continue to work to advance exercises and establish consistency with home program.      Occupational Profile and client history currently impacting functional performance  history of 17 surgeries, multiple surgeries to both knees, recent foot surgery, lives alone, deep brain stimulator bilaterally. multiple comorbidities    Occupational performance deficits (Please refer to evaluation for details):  ADL's;IADL's;Leisure;Social Participation    Rehab Potential  Good    Current Impairments/barriers affecting progress:  recent decline in functional mobility, recent divorce, lives alone and needs to remain independent, history of falls    OT Frequency  4x / week    OT Duration  4 weeks    OT Treatment/Interventions  Self-care/ADL training;Therapeutic exercise;DME and/or AE instruction;Functional Mobility Training;Balance training;Neuromuscular education;Gait Training;Manual Therapy;Moist Heat;Energy conservation;Therapeutic activities;Patient/family education    Consulted and Agree with Plan of Care  Patient       Patient will benefit from skilled therapeutic intervention in order to improve the following deficits and impairments:  Abnormal gait, Decreased balance, Decreased endurance, Decreased mobility, Difficulty walking, Decreased range of motion, Decreased coordination, Decreased strength, Impaired flexibility, Impaired UE functional use  Visit Diagnosis: Difficulty in walking, not elsewhere classified  Muscle weakness (generalized)  Other lack of coordination  Unsteadiness on feet  Chronic pain of right knee    Problem List Patient Active Problem List   Diagnosis Date Noted  . Essential hypertension, benign 09/15/2018  . Dyslipidemia 09/15/2018  . Hypogonadism in male 06/18/2018  . Testicular cancer ([place]) 06/13/2018  . Hyperlipidemia 06/13/2018  . Depression 06/13/2018  . Anxiety 06/13/2018  . Callus of foot  03/27/2018  . Pes planovalgus, acquired, right 01/30/2018  . Arthritis of midfoot 01/30/2018  . Achilles tendon contracture, right 01/30/2018  . Left shoulder pain 07/26/2017  . Infection of prosthetic left knee joint ([place]) 07/10/2017  . 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[place]) 05/15/2017  . GERD (gastroesophageal reflux disease) 03/20/2017  . Primary osteoarthritis of one knee, right 12/30/2016  . H/O total knee replacement, left 12/30/2016  . Moderate recurrent major depression ([place]) 08/15/2016  . Chest pain due to myocardial ischemia 08/14/2016  . Chest pain, rule out acute myocardial infarction 08/14/2016  . Chest x-ray abnormality 10/17/2013  . Scoliosis 07/11/2012  . Arthritis of wrist, left, degenerative 07/11/2012  . Parkinson's disease [facility]) 01/07/2012   Atul Delucia T Tomasita Morrow, OTR/L, CLT  Corazon Nickolas 12/19/2018, 8:48 PM  [place] MAIN [facility] SERVICES [address] [place], [place], [address] Phone: [phone]   Fax:  [phone]  Name: Charles Choi MRN: [id number] Date of Birth: Nov 28, 1950

## 2018-12-19 NOTE — Therapy (Signed)
[place] MAIN [facility] SERVICES [address] [place], [place], [address] Phone: [phone]   Fax:  [phone]  Occupational Therapy Treatment  Patient Details  Name: Charles Choi MRN: [id number] Date of Birth: [birth year] No data recorded  Encounter Date: 12/11/2018  OT End of Session - 12/19/18 2029    Visit Number  3    Number of Visits  17    Date for OT Re-Evaluation  01/11/19    Authorization Type  Medicare    OT Start Time  1305    OT Stop Time  1401    OT Time Calculation (min)  56 min    Activity Tolerance  Patient tolerated treatment well;Patient limited by fatigue    Behavior During Therapy  [facility] for tasks assessed/performed       Past Medical History:  Diagnosis Date  . Anxiety   . Cancer ([place])   . Depression   . Hypertension   . Parkinson's disease [facility])     Past Surgical History:  Procedure Laterality Date  . BACK SURGERY    . CHOLECYSTECTOMY    . DEEP BRAIN STIMULATOR PLACEMENT     for Parkinson's disease  . JOINT REPLACEMENT     left knee x2  . ORCHIECTOMY  1983    There were no vitals filed for this visit.  Subjective Assessment - 12/19/18 2027    Subjective   Patient reports he is a little sore today from the exercises yesterday.  He was disappointed in his walk test and would like to be able to move more.      Pertinent History  Patient reports he was diagnosed 40 years ago with Parkinson's at the age of 5 and has been a natural progression, DBS Aug 2006 with dramatic changes, has it bilaterally.  Involuntary movements worse on left than right.     Patient Stated Goals  Patient would like to be able to sustain his independence as long as possible.      Currently in Pain?  Yes    Pain Score  2     Pain Location  Wrist    Pain Orientation  Right    Pain Descriptors / Indicators  Aching    Pain Type  Chronic pain    Pain Onset  More than a month ago    Pain Frequency  Intermittent    Multiple Pain Sites   No       Patient seen for initial instruction of LSVT BIG exercises: LSVT Daily Session Maximal Daily Exercises:  Sustained movements are designed to rescale the amplitude of movement output for generalization  to daily functional activities. Performed as follows for 1 set of 10 repetitions each: Multi directional  sustained movements- 1) Floor to ceiling, 2) Side to side. Multi directional Repetitive movements  performed in standing and are designed to provide retraining effort needed for sustained muscle activation in tasks Performed as follows: 3) Step and reach forward, 4) Step and Reach Backwards,  5) Step and reach sideways, 6) Rock and reach forward/backward, 7) Rock and reach sideways.   Patient unable to perform in a standard version. Patient has mod to max difficulty with adapted version of exercises and will not be able to perform safely at home with this version due to poor balance and weight shifting.            Focused this date on maximal daily exercises from a seated version due to balance and weight  shifting difficulties.  Patient currently unable to perform standard or adapted version with a chair safely at home.  Will provide written/pictorial exercises next date once patient has another opportunity to have instruction for proper form and technique.  Functional mobility this date for 2 trials of 150 feet with cues for amplitude of gait, steps tend to be short, shuffled and often walks with a limp.  Able to correct pattern briefly but reverts back to old pattern quickly.  Rest breaks as needed.  Continue to work towards goals and increase independence in daily home exercise program.            OT Education - 12/19/18 2028    Education Details  LSVT BIG maximal daily exercises seated version    Person(s) Educated  Patient    Methods  Explanation;Demonstration;Verbal cues    Comprehension  Verbalized understanding;Returned demonstration;Verbal cues required           OT Long Term Goals - 12/07/18 1724      OT LONG TERM GOAL #1   Title  Patient will complete HEP for maximal daily exercises with modified independence in 4 weeks    Baseline  no current program at eval    Time  4    Period  Weeks    Status  New    Target Date  01/11/19      OT LONG TERM GOAL #2   Title  Patient will transfer from sit to stand without the use of arms safely and independently from a variety of lower chairs/surfaces in 4 weeks.    Baseline  difficulty with low surfaces    Time  4    Period  Weeks    Status  New    Target Date  01/11/19      OT LONG TERM GOAL #3   Title  Patient will demonstrate decreased freezing of gait behaviors with score of less than 10 on freezing of gait questionnaire.    Baseline  score of 12 at eval    Time  4    Period  Weeks    Status  New    Target Date  01/11/19      OT LONG TERM GOAL #4   Title  Patient will improve gait speed and endurance and be able to walk 525 feet in 6 minutes to negotiate around the home and community safely in 4 weeks    Baseline  Unable to perform 6 min test at eval, performed with PT 430 feet on 11/14/2018    Time  4    Period  Weeks    Status  New    Target Date  01/11/19      OT LONG TERM GOAL #5   Title  Patient will complete buttons with modified independence     Baseline  increased difficulty at eval    Time  4    Period  Weeks    Status  New    Target Date  01/11/19            Plan - 12/19/18 2029    Clinical Impression Statement  Focused this date on maximal daily exercises from a seated version due to balance and weight shifting difficulties.  Patient currently unable to perform standard or adapted version with a chair safely at home.  Will provide written/pictorial exercises next date once patient has another opportunity to have instruction for proper form and technique.  Functional mobility this date for 2 trials of  150 feet with cues for amplitude of gait, steps tend to be  short, shuffled and often walks with a limp.  Able to correct pattern briefly but reverts back to old pattern quickly.  Rest breaks as needed.  Continue to work towards goals and increase independence in daily home exercise program.     Occupational Profile and client history currently impacting functional performance  history of 17 surgeries, multiple surgeries to both knees, recent foot surgery, lives alone, deep brain stimulator bilaterally. multiple comorbidities    Occupational performance deficits (Please refer to evaluation for details):  ADL's;IADL's;Leisure;Social Participation    Rehab Potential  Good    Current Impairments/barriers affecting progress:  recent decline in functional mobility, recent divorce, lives alone and needs to remain independent, history of falls    OT Frequency  4x / week    OT Duration  4 weeks    OT Treatment/Interventions  Self-care/ADL training;Therapeutic exercise;DME and/or AE instruction;Functional Mobility Training;Balance training;Neuromuscular education;Gait Training;Manual Therapy;Moist Heat;Energy conservation;Therapeutic activities;Patient/family education    Consulted and Agree with Plan of Care  Patient       Patient will benefit from skilled therapeutic intervention in order to improve the following deficits and impairments:  Abnormal gait, Decreased balance, Decreased endurance, Decreased mobility, Difficulty walking, Decreased range of motion, Decreased coordination, Decreased strength, Impaired flexibility, Impaired UE functional use  Visit Diagnosis: Difficulty in walking, not elsewhere classified  Muscle weakness (generalized)  Other lack of coordination  Unsteadiness on feet    Problem List Patient Active Problem List   Diagnosis Date Noted  . Essential hypertension, benign 09/15/2018  . Dyslipidemia 09/15/2018  . Hypogonadism in male 06/18/2018  . Testicular cancer ([place]) 06/13/2018  . Hyperlipidemia 06/13/2018  . Depression  06/13/2018  . Anxiety 06/13/2018  . Callus of foot 03/27/2018  . Pes planovalgus, acquired, right 01/30/2018  . Arthritis of midfoot 01/30/2018  . Achilles tendon contracture, right 01/30/2018  . Left shoulder pain 07/26/2017  . Infection of prosthetic left knee joint ([place]) 07/10/2017  . 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[place]) 05/15/2017  . GERD (gastroesophageal reflux disease) 03/20/2017  . Primary osteoarthritis of one knee, right 12/30/2016  . H/O total knee replacement, left 12/30/2016  . Moderate recurrent major depression ([place]) 08/15/2016  . Chest pain due to myocardial ischemia 08/14/2016  . Chest pain, rule out acute myocardial infarction 08/14/2016  . Chest x-ray abnormality 10/17/2013  . Scoliosis 07/11/2012  . Arthritis of wrist, left, degenerative 07/11/2012  . Parkinson's disease [facility]) 01/07/2012   Amy T Tomasita Morrow, OTR/L, CLT  Lovett,Amy 12/19/2018, 8:34 PM  [place] MAIN [facility] SERVICES [address] [place], [place], [address] Phone: [phone]   Fax:  [phone]  Name: BIRAN MAYBERRY MRN: [id number] Date of Birth: 11-Sep-1951

## 2018-12-19 NOTE — Therapy (Signed)
[place] MAIN [facility] SERVICES [address] [place], [place], [address] Phone: [phone]   Fax:  [phone]  Occupational Therapy Treatment  Patient Details  Name: Charles Choi MRN: [id number] Date of Birth: 1951-08-06 No data recorded  Encounter Date: 12/10/2018  OT End of Session - 12/19/18 1906    Visit Number  2    Number of Visits  17    Date for OT Re-Evaluation  01/11/19    Authorization Type  Medicare    OT Start Time  1303    OT Stop Time  1400    OT Time Calculation (min)  57 min    Activity Tolerance  Patient tolerated treatment well;Patient limited by fatigue    Behavior During Therapy  [facility] for tasks assessed/performed       Past Medical History:  Diagnosis Date  . Anxiety   . Cancer ([place])   . Depression   . Hypertension   . Parkinson's disease [facility])     Past Surgical History:  Procedure Laterality Date  . BACK SURGERY    . CHOLECYSTECTOMY    . DEEP BRAIN STIMULATOR PLACEMENT     for Parkinson's disease  . JOINT REPLACEMENT     left knee x2  . ORCHIECTOMY  1983    There were no vitals filed for this visit.  Subjective Assessment - 12/19/18 1904    Subjective   Patient reports he is feeling better today than he was last week at his evaluation and is willing to try to do his 6 minute walk test.  Reports he is disappointed that he is having more difficulty with functional mobility.  Brought in his rollator this date.      Pertinent History  Patient reports he was diagnosed 40 years ago with Parkinson's at the age of 61 and has been a natural progression, DBS Aug 2006 with dramatic changes, has it bilaterally.  Involuntary movements worse on left than right.     Patient Stated Goals  Patient would like to be able to sustain his independence as long as possible.      Currently in Pain?  No/denies    Pain Score  0-No pain    Multiple Pain Sites  No      Neuromuscular Reeducation:   Patient seen for initial  instruction of LSVT BIG exercises: LSVT Daily Session Maximal Daily Exercises:  Sustained movements are designed to rescale the amplitude of movement output for generalization  to daily functional activities. Performed as follows for 1 set of 10 repetitions each: Multi directional  sustained movements- 1) Floor to ceiling, 2) Side to side. Multi directional Repetitive movements  performed in standing and are designed to provide retraining effort needed for sustained muscle activation in tasks Performed as follows: 3) Step and reach forward, 4) Step and Reach Backwards,  5) Step and reach sideways, 6) Rock and reach forward/backward, 7) Rock and reach sideways.    Patient unable to perform in a standard version. Patient has mod to max difficulty with adapted version of exercises and will not be able to perform safely at home with this version due to poor balance and weight shifting.   Will attempt seated version next session and progress as able.   6 minute walk test 280 feet this date with rollator and rest breaks as needed with clock running. (1000 feet is community ambulator)  OT Education - 12/19/18 1905    Education Details  review of goals, results of 6 min walk test, LSVT BIG exercises    Methods  Explanation;Demonstration;Verbal cues    Comprehension  Verbalized understanding;Returned demonstration;Verbal cues required          OT Long Term Goals - 12/07/18 1724      OT LONG TERM GOAL #1   Title  Patient will complete HEP for maximal daily exercises with modified independence in 4 weeks    Baseline  no current program at eval    Time  4    Period  Weeks    Status  New    Target Date  01/11/19      OT LONG TERM GOAL #2   Title  Patient will transfer from sit to stand without the use of arms safely and independently from a variety of lower chairs/surfaces in 4 weeks.    Baseline  difficulty with low surfaces    Time  4    Period   Weeks    Status  New    Target Date  01/11/19      OT LONG TERM GOAL #3   Title  Patient will demonstrate decreased freezing of gait behaviors with score of less than 10 on freezing of gait questionnaire.    Baseline  score of 12 at eval    Time  4    Period  Weeks    Status  New    Target Date  01/11/19      OT LONG TERM GOAL #4   Title  Patient will improve gait speed and endurance and be able to walk 525 feet in 6 minutes to negotiate around the home and community safely in 4 weeks    Baseline  Unable to perform 6 min test at eval, performed with PT 430 feet on 11/14/2018    Time  4    Period  Weeks    Status  New    Target Date  01/11/19      OT LONG TERM GOAL #5   Title  Patient will complete buttons with modified independence     Baseline  increased difficulty at eval    Time  4    Period  Weeks    Status  New    Target Date  01/11/19            Plan - 12/19/18 1906    Clinical Impression Statement  initiated  instructions for  LSVT BIG maximal daily exercises this session.  Patient is unable to perform in a standard version and has increased difficulty with balance and weight shifting to perform in an adapted version.  Patient may required a seated version to start at home and then attempt to progress over the next sessions to an adapted version.   Patient lives alone and does not have assistance with exercises on a daily basis and will require extensive modifications initially.  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  Continue to work towards establishing his home program for LSVT BIG maximal daily exercises.      Occupational Profile and client history currently impacting functional performance  history of 17 surgeries, multiple surgeries to both knees, recent foot surgery, lives alone, deep brain stimulator bilaterally. multiple comorbidities    Occupational performance deficits (Please refer to evaluation for details):   ADL's;IADL's;Leisure;Social Participation    Rehab Potential  Good  Current Impairments/barriers affecting progress:  recent decline in functional mobility, recent divorce, lives alone and needs to remain independent, history of falls    OT Frequency  4x / week    OT Duration  4 weeks    OT Treatment/Interventions  Self-care/ADL training;Therapeutic exercise;DME and/or AE instruction;Functional Mobility Training;Balance training;Neuromuscular education;Gait Training;Manual Therapy;Moist Heat;Energy conservation;Therapeutic activities;Patient/family education    Consulted and Agree with Plan of Care  Patient       Patient will benefit from skilled therapeutic intervention in order to improve the following deficits and impairments:  Abnormal gait, Decreased balance, Decreased endurance, Decreased mobility, Difficulty walking, Decreased range of motion, Decreased coordination, Decreased strength, Impaired flexibility, Impaired UE functional use  Visit Diagnosis: Difficulty in walking, not elsewhere classified  Muscle weakness (generalized)  Other lack of coordination  Unsteadiness on feet    Problem List Patient Active Problem List   Diagnosis Date Noted  . Essential hypertension, benign 09/15/2018  . Dyslipidemia 09/15/2018  . Hypogonadism in male 06/18/2018  . Testicular cancer ([place]) 06/13/2018  . Hyperlipidemia 06/13/2018  . Depression 06/13/2018  . Anxiety 06/13/2018  . Callus of foot 03/27/2018  . Pes planovalgus, acquired, right 01/30/2018  . Arthritis of midfoot 01/30/2018  . Achilles tendon contracture, right 01/30/2018  . Left shoulder pain 07/26/2017  . Infection of prosthetic left knee joint ([place]) 07/10/2017  . 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[place]) 05/15/2017  . GERD (gastroesophageal reflux disease) 03/20/2017  . Primary osteoarthritis of one knee, right 12/30/2016  . H/O total knee replacement, left 12/30/2016  . Moderate recurrent major depression ([place])  08/15/2016  . Chest pain due to myocardial ischemia 08/14/2016  . Chest pain, rule out acute myocardial infarction 08/14/2016  . Chest x-ray abnormality 10/17/2013  . Scoliosis 07/11/2012  . Arthritis of wrist, left, degenerative 07/11/2012  . Parkinson's disease ([place]) 01/07/2012   Jazmon Kos T Tomasita Morrow, OTR/L, CLT  Shantel Wesely 12/19/2018, 7:18 PM  [place] MAIN [facility] SERVICES [address] [place], [place], [address] Phone: [phone]   Fax:  [phone]  Name: AVERI CACIOPPO MRN: [id number] Date of Birth: 10/26/1951

## 2018-12-20 ENCOUNTER — Ambulatory Visit: Payer: Medicare Other | Admitting: Occupational Therapy

## 2018-12-20 DIAGNOSIS — R262 Difficulty in walking, not elsewhere classified: Secondary | ICD-10-CM

## 2018-12-20 DIAGNOSIS — R278 Other lack of coordination: Secondary | ICD-10-CM

## 2018-12-20 DIAGNOSIS — R2681 Unsteadiness on feet: Secondary | ICD-10-CM

## 2018-12-20 DIAGNOSIS — M6281 Muscle weakness (generalized): Secondary | ICD-10-CM

## 2018-12-21 ENCOUNTER — Ambulatory Visit: Payer: Medicare Other | Admitting: Occupational Therapy

## 2018-12-21 DIAGNOSIS — M6281 Muscle weakness (generalized): Secondary | ICD-10-CM

## 2018-12-21 DIAGNOSIS — R262 Difficulty in walking, not elsewhere classified: Secondary | ICD-10-CM | POA: Diagnosis not present

## 2018-12-21 DIAGNOSIS — R278 Other lack of coordination: Secondary | ICD-10-CM

## 2018-12-21 DIAGNOSIS — R2681 Unsteadiness on feet: Secondary | ICD-10-CM

## 2018-12-22 ENCOUNTER — Encounter: Payer: Self-pay | Admitting: Occupational Therapy

## 2018-12-22 NOTE — Therapy (Signed)
[place] MAIN [facility] SERVICES [address] [place], [place], [address] Phone: [phone]   Fax:  [phone]  Occupational Therapy Treatment  Patient Details  Name: Charles Choi MRN: [id number] Date of Birth: Dec 28, 1950 No data recorded  Encounter Date: 12/21/2018  OT End of Session - 12/22/18 0855    Visit Number  9    Number of Visits  17    Date for OT Re-Evaluation  01/11/19    Authorization Type  Medicare    OT Start Time  1100    OT Stop Time  1200    OT Time Calculation (min)  60 min    Activity Tolerance  Patient tolerated treatment well;Patient limited by fatigue    Behavior During Therapy  [facility] for tasks assessed/performed       Past Medical History:  Diagnosis Date  . Anxiety   . Cancer ([place])   . Depression   . Hypertension   . Parkinson's disease [facility])     Past Surgical History:  Procedure Laterality Date  . BACK SURGERY    . CHOLECYSTECTOMY    . DEEP BRAIN STIMULATOR PLACEMENT     for Parkinson's disease  . JOINT REPLACEMENT     left knee x2  . ORCHIECTOMY  1983    There were no vitals filed for this visit.  Subjective Assessment - 12/22/18 0854    Subjective   Patient reports he has been busy and over the last couple days running errands and getting ready for his company this weekend. He has implemented some energy saving techniques and organization so that he is not feeling stressed or rushed on Saturday which causes him to demonstrate freezing of gait behaviors.     Pertinent History  Patient reports he was diagnosed 40 years ago with Parkinson's at the age of 71 and has been a natural progression, DBS Aug 2006 with dramatic changes, has it bilaterally.  Involuntary movements worse on left than right.     Patient Stated Goals  Patient would like to be able to sustain his independence as long as possible.      Currently in Pain?  Yes    Pain Score  2     Pain Location  Arm    Pain Orientation  Right    Pain  Descriptors / Indicators  Aching    Pain Type  Chronic pain    Pain Onset  More than a month ago    Pain Frequency  Intermittent    Multiple Pain Sites  No        Neuromuscular reeducation: Patient seen for initial instruction of LSVT BIG exercises: LSVT Daily Session Maximal Daily Exercises:  Sustained movements are designed to rescale the amplitude of movement output for generalization  to daily functional activities. Performed as follows for 1 set of 10 repetitions each: Multi directional  sustained movements- 1) Floor to ceiling, 2) Side to side. Multi directional Repetitive movements  performed in standing and are designed to provide retraining effort needed for sustained muscle  activation in tasks Performed as follows: 3) Step and reach forward, 4) Step and Reach Backwards,  5) Step and reach sideways, 6) Rock and reach forward/backward, 7) Rock and reach sideways.    Patient performed in the clinic from seated position with therapist cues and demonstration for proper form and technique.    Exercises 3-7 repeated again in standing with minimal assist for balance and use of chair for an adapted version.  Improved stepping patterns on the right side today.  Functional mobility with rollator this date, 2 trials of 175 feet with cues for amplitude of gait, decreased freezing of gait this date.  ADL: Establishment of Functional component tasks 1.sit to stand-Sit to stand with emphasis on controlling decent from standing to sitting. Squats performed with therapist directing patient movements and working on depth of squat to improve performance with control.2 sets of 5 repetitions each 2.stepping on and off a curb moderate assist 3.reaching down to manage shoelaces verbal cues provided 4. Navigating in crowded areas without freezing moderate cues and variety of techniques used to break freezing pattern 5. Reaching into the closet for clothing min guard and cues            Patient had a difficult day yesterday but after implementing some of the strategies we discussed, he is performing better this date.  He did report an episode when getting out of the car today using his rollator and another car started to move forward beside him and it triggered a freezing of gait response.  The valet staff recognized he needed help and got a transport chair.  Once in the clinic, his performance continued to improve and he did well today with standing exercises with min assist this date from therapist.  Therapist still recommends performing exercises at home over the weekend from a seated version due to continued balance limitations however he was much improved with stepping patterns and weight shifting.  Patient is aware he will need to continue with exercises 2 times a day over the next couple days.  Continues to progress towards goals in plan of care to address deficits and improve independence.           OT Education - 12/22/18 0854    Education Details  managing freezing of gait behaviors, daily exercises     Person(s) Educated  Patient    Methods  Explanation;Demonstration;Verbal cues;Handout    Comprehension  Verbalized understanding;Returned demonstration;Verbal cues required          OT Long Term Goals - 12/07/18 1724      OT LONG TERM GOAL #1   Title  Patient will complete HEP for maximal daily exercises with modified independence in 4 weeks    Baseline  no current program at eval    Time  4    Period  Weeks    Status  New    Target Date  01/11/19      OT LONG TERM GOAL #2   Title  Patient will transfer from sit to stand without the use of arms safely and independently from a variety of lower chairs/surfaces in 4 weeks.    Baseline  difficulty with low surfaces    Time  4    Period  Weeks    Status  New    Target Date  01/11/19      OT LONG TERM GOAL #3   Title  Patient will demonstrate decreased freezing of gait behaviors with score of less than 10  on freezing of gait questionnaire.    Baseline  score of 12 at eval    Time  4    Period  Weeks    Status  New    Target Date  01/11/19      OT LONG TERM GOAL #4   Title  Patient will improve gait speed and endurance and be able to walk 525 feet in 6 minutes to negotiate around the home  and community safely in 4 weeks    Baseline  Unable to perform 6 min test at eval, performed with PT 430 feet on 11/14/2018    Time  4    Period  Weeks    Status  New    Target Date  01/11/19      OT LONG TERM GOAL #5   Title  Patient will complete buttons with modified independence     Baseline  increased difficulty at eval    Time  4    Period  Weeks    Status  New    Target Date  01/11/19            Plan - 12/22/18 0855    Clinical Impression Statement  Patient had a difficult day yesterday but after implementing some of the strategies we discussed, he is performing better this date.  He did report an episode when getting out of the car today using his rollator and another car started to move forward beside him and it triggered a freezing of gait response.  The valet staff recognized he needed help and got a transport chair.  Once in the clinic, his performance continued to improve and he did well today with standing exercises with min assist this date from therapist.  Therapist still recommends performing exercises at home over the weekend from a seated version due to continued balance limitations however he was much improved with stepping patterns and weight shifting.  Patient is aware he will need to continue with exercises 2 times a day over the next couple days.  Continues to progress towards goals in plan of care to address deficits and improve independence.      Occupational Profile and client history currently impacting functional performance  history of 17 surgeries, multiple surgeries to both knees, recent foot surgery, lives alone, deep brain stimulator bilaterally. multiple comorbidities     Occupational performance deficits (Please refer to evaluation for details):  ADL's;IADL's;Leisure;Social Participation    Rehab Potential  Good    Current Impairments/barriers affecting progress:  recent decline in functional mobility, recent divorce, lives alone and needs to remain independent, history of falls    OT Frequency  4x / week    OT Duration  4 weeks    OT Treatment/Interventions  Self-care/ADL training;Therapeutic exercise;DME and/or AE instruction;Functional Mobility Training;Balance training;Neuromuscular education;Gait Training;Manual Therapy;Moist Heat;Energy conservation;Therapeutic activities;Patient/family education    Consulted and Agree with Plan of Care  Patient       Patient will benefit from skilled therapeutic intervention in order to improve the following deficits and impairments:  Abnormal gait, Decreased balance, Decreased endurance, Decreased mobility, Difficulty walking, Decreased range of motion, Decreased coordination, Decreased strength, Impaired flexibility, Impaired UE functional use  Visit Diagnosis: Difficulty in walking, not elsewhere classified  Muscle weakness (generalized)  Other lack of coordination  Unsteadiness on feet    Problem List Patient Active Problem List   Diagnosis Date Noted  . Essential hypertension, benign 09/15/2018  . Dyslipidemia 09/15/2018  . Hypogonadism in male 06/18/2018  . Testicular cancer ([place]) 06/13/2018  . Hyperlipidemia 06/13/2018  . Depression 06/13/2018  . Anxiety 06/13/2018  . Callus of foot 03/27/2018  . Pes planovalgus, acquired, right 01/30/2018  . Arthritis of midfoot 01/30/2018  . Achilles tendon contracture, right 01/30/2018  . Left shoulder pain 07/26/2017  . Infection of prosthetic left knee joint ([place]) 07/10/2017  . 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[place]) 05/15/2017  . GERD (gastroesophageal reflux disease) 03/20/2017  . Primary  osteoarthritis of one knee, right 12/30/2016  . H/O total knee  replacement, left 12/30/2016  . Moderate recurrent major depression ([place]) 08/15/2016  . Chest pain due to myocardial ischemia 08/14/2016  . Chest pain, rule out acute myocardial infarction 08/14/2016  . Chest x-ray abnormality 10/17/2013  . Scoliosis 07/11/2012  . Arthritis of wrist, left, degenerative 07/11/2012  . Parkinson's disease ([place]) 01/07/2012   Mliss Wedin T Tomasita Morrow, OTR/L, CLT  Cyara Devoto 12/22/2018, 9:01 AM  [place] MAIN [facility] SERVICES [address] [place], [place], [address] Phone: [phone]   Fax:  [phone]  Name: THAILAND DUBE MRN: [id number] Date of Birth: Jul 29, 1951

## 2018-12-22 NOTE — Therapy (Signed)
[place] MAIN [facility] SERVICES [address] [place], [place], [address] Phone: [phone]   Fax:  [phone]  Occupational Therapy Treatment  Patient Details  Name: Charles Choi MRN: [id number] Date of Birth: Jun 23, 1951 No data recorded  Encounter Date: 12/19/2018  OT End of Session - 12/22/18 0827    Visit Number  7    Number of Visits  17    Date for OT Re-Evaluation  01/11/19    Authorization Type  Medicare    OT Start Time  1308    OT Stop Time  1405    OT Time Calculation (min)  57 min    Activity Tolerance  Patient tolerated treatment well;Patient limited by fatigue    Behavior During Therapy  [facility] for tasks assessed/performed       Past Medical History:  Diagnosis Date  . Anxiety   . Cancer ([place])   . Depression   . Hypertension   . Parkinson's disease [facility])     Past Surgical History:  Procedure Laterality Date  . BACK SURGERY    . CHOLECYSTECTOMY    . DEEP BRAIN STIMULATOR PLACEMENT     for Parkinson's disease  . JOINT REPLACEMENT     left knee x2  . ORCHIECTOMY  1983    There were no vitals filed for this visit.  Subjective Assessment - 12/22/18 0826    Subjective   Patient reports he has been planning for a friend to visit this weekend and would like to cook a Valentines dinner on Saturday    Pertinent History  Patient reports he was diagnosed 40 years ago with Parkinson's at the age of 89 and has been a natural progression, DBS Aug 2006 with dramatic changes, has it bilaterally.  Involuntary movements worse on left than right.     Patient Stated Goals  Patient would like to be able to sustain his independence as long as possible.      Currently in Pain?  No/denies    Pain Score  0-No pain       Patient seen for initial instruction of LSVT BIG exercises: LSVT Daily Session Maximal Daily Exercises:  Sustained movements are designed to rescale the amplitude of movement output for generalization  to daily  functional activities. Performed as follows for 1 set of 10 repetitions each: Multi directional  sustained movements- 1) Floor to ceiling, 2) Side to side. Multi directional Repetitive movements  performed in standing and are designed to provide retraining effort needed for sustained muscle  activation in tasks Performed as follows: 3) Step and reach forward, 4) Step and Reach Backwards,  5) Step and reach sideways, 6) Rock and reach forward/backward, 7) Rock and reach sideways.    Patient performed in the clinic from seated position version with therapist cues and demonstration for proper form and technique.   Patient unable to recall exercises but familiar when performing and is able to demo understanding of first 2 exercises with decreased cues, other exercises require min to moderate cues.    After all exercises completed in sitting, then performed exercises 3-7 in standing with therapist min assist and chair for balance.  Difficulty at times with motion of left UE and has moderate difficulty with stepping patterns on the right LE.        Patient seen for functional mobility for 2 trials of 150 feet with use of rollator and cues for increasing step length.    Functional component tasks:  1.  Sit to stand with emphasis on controlling decent from standing to sitting.  Squats performed with therapist directing patient movements and working on depth of squat to improve performance with control.   2.  Reaching towards feet to manage shoelaces.  Instructed on planning and organization techniques in the kitchen when preparing food for company this weekend, focused on short cuts using items such as an  Egg slicer for cutting strawberries and browning beef ahead of time for spaghetti.                   OT Education - 12/22/18 0827    Education Details  LSVT BIG maximal daily exercises seated version, functional component tasks    Person(s) Educated  Patient    Methods   Explanation;Demonstration;Verbal cues;Handout    Comprehension  Verbalized understanding;Returned demonstration;Verbal cues required          OT Long Term Goals - 12/07/18 1724      OT LONG TERM GOAL #1   Title  Patient will complete HEP for maximal daily exercises with modified independence in 4 weeks    Baseline  no current program at eval    Time  4    Period  Weeks    Status  New    Target Date  01/11/19      OT LONG TERM GOAL #2   Title  Patient will transfer from sit to stand without the use of arms safely and independently from a variety of lower chairs/surfaces in 4 weeks.    Baseline  difficulty with low surfaces    Time  4    Period  Weeks    Status  New    Target Date  01/11/19      OT LONG TERM GOAL #3   Title  Patient will demonstrate decreased freezing of gait behaviors with score of less than 10 on freezing of gait questionnaire.    Baseline  score of 12 at eval    Time  4    Period  Weeks    Status  New    Target Date  01/11/19      OT LONG TERM GOAL #4   Title  Patient will improve gait speed and endurance and be able to walk 525 feet in 6 minutes to negotiate around the home and community safely in 4 weeks    Baseline  Unable to perform 6 min test at eval, performed with PT 430 feet on 11/14/2018    Time  4    Period  Weeks    Status  New    Target Date  01/11/19      OT LONG TERM GOAL #5   Title  Patient will complete buttons with modified independence     Baseline  increased difficulty at eval    Time  4    Period  Weeks    Status  New    Target Date  01/11/19            Plan - 12/22/18 0160    Clinical Impression Statement  Patient is unable to recall specific exercises but is familiar with each exercise once it's shown. He continues to require the use of handouts at home and continues to perform exercises from a seated position. He demonstrates difficulty with controlling his decent from stand to sit. Focused on control this day with  directed squats. Decreased step length on the right with functional mobility. Continue to work towards goals  to increase independence and safety with daily tasks.    Occupational Profile and client history currently impacting functional performance  history of 17 surgeries, multiple surgeries to both knees, recent foot surgery, lives alone, deep brain stimulator bilaterally. multiple comorbidities    Occupational performance deficits (Please refer to evaluation for details):  ADL's;IADL's;Leisure;Social Participation    Rehab Potential  Good    Current Impairments/barriers affecting progress:  recent decline in functional mobility, recent divorce, lives alone and needs to remain independent, history of falls    OT Frequency  4x / week    OT Duration  4 weeks    OT Treatment/Interventions  Self-care/ADL training;Therapeutic exercise;DME and/or AE instruction;Functional Mobility Training;Balance training;Neuromuscular education;Gait Training;Manual Therapy;Moist Heat;Energy conservation;Therapeutic activities;Patient/family education    Consulted and Agree with Plan of Care  Patient       Patient will benefit from skilled therapeutic intervention in order to improve the following deficits and impairments:  Abnormal gait, Decreased balance, Decreased endurance, Decreased mobility, Difficulty walking, Decreased range of motion, Decreased coordination, Decreased strength, Impaired flexibility, Impaired UE functional use  Visit Diagnosis: Difficulty in walking, not elsewhere classified  Muscle weakness (generalized)  Other lack of coordination  Unsteadiness on feet    Problem List Patient Active Problem List   Diagnosis Date Noted  . Essential hypertension, benign 09/15/2018  . Dyslipidemia 09/15/2018  . Hypogonadism in male 06/18/2018  . Testicular cancer ([place]) 06/13/2018  . Hyperlipidemia 06/13/2018  . Depression 06/13/2018  . Anxiety 06/13/2018  . Callus of foot 03/27/2018  . Pes  planovalgus, acquired, right 01/30/2018  . Arthritis of midfoot 01/30/2018  . Achilles tendon contracture, right 01/30/2018  . Left shoulder pain 07/26/2017  . Infection of prosthetic left knee joint ([place]) 07/10/2017  . 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[place]) 05/15/2017  . GERD (gastroesophageal reflux disease) 03/20/2017  . Primary osteoarthritis of one knee, right 12/30/2016  . H/O total knee replacement, left 12/30/2016  . Moderate recurrent major depression ([place]) 08/15/2016  . Chest pain due to myocardial ischemia 08/14/2016  . Chest pain, rule out acute myocardial infarction 08/14/2016  . Chest x-ray abnormality 10/17/2013  . Scoliosis 07/11/2012  . Arthritis of wrist, left, degenerative 07/11/2012  . Parkinson's disease ([place]) 01/07/2012   Cadden Elizondo T Tomasita Morrow, OTR/L, CLT  Verdell Dykman 12/22/2018, 8:29 AM  [place] MAIN [facility] SERVICES [address] [place], [place], [address] Phone: [phone]   Fax:  [phone]  Name: Charles Choi MRN: [id number] Date of Birth: [birth year]

## 2018-12-22 NOTE — Therapy (Signed)
[place] MAIN [facility] SERVICES [address] [place], [place], [address] Phone: [phone]   Fax:  [phone]  Occupational Therapy Treatment  Patient Details  Name: Charles Choi MRN: [id number] Date of Birth: 09-Mar-1951 No data recorded  Encounter Date: 12/20/2018  OT End of Session - 12/22/18 0840    Visit Number  8    Number of Visits  17    Date for OT Re-Evaluation  01/11/19    Authorization Type  Medicare    OT Start Time  1303    OT Stop Time  1400    OT Time Calculation (min)  57 min    Activity Tolerance  Patient tolerated treatment well;Patient limited by fatigue    Behavior During Therapy  [facility] for tasks assessed/performed       Past Medical History:  Diagnosis Date  . Anxiety   . Cancer ([place])   . Depression   . Hypertension   . Parkinson's disease [facility])     Past Surgical History:  Procedure Laterality Date  . BACK SURGERY    . CHOLECYSTECTOMY    . DEEP BRAIN STIMULATOR PLACEMENT     for Parkinson's disease  . JOINT REPLACEMENT     left knee x2  . ORCHIECTOMY  1983    There were no vitals filed for this visit.  Subjective Assessment - 12/22/18 0837    Subjective   Patient reports "today has not been a good day", difficulty with freezing of gait at times and reports difficulty walking today.    Pertinent History  Patient reports he was diagnosed 40 years ago with Parkinson's at the age of 54 and has been a natural progression, DBS Aug 2006 with dramatic changes, has it bilaterally.  Involuntary movements worse on left than right.     Patient Stated Goals  Patient would like to be able to sustain his independence as long as possible.      Currently in Pain?  Yes    Pain Score  2     Pain Location  Arm    Pain Orientation  Right    Pain Descriptors / Indicators  Aching    Pain Type  Chronic pain    Pain Onset  More than a month ago    Pain Frequency  Intermittent    Multiple Pain Sites  No       Patient seen  for initial instruction of LSVT BIG exercises: LSVT Daily Session Maximal Daily Exercises:  Sustained movements are designed to rescale the amplitude of movement output for generalization  to daily functional activities. Performed as follows for 1 set of 10 repetitions each: Multi directional  sustained movements- 1) Floor to ceiling, 2) Side to side. Multi directional Repetitive movements  performed in standing and are designed to provide retraining effort needed for sustained muscle  activation in tasks Performed as follows: 3) Step and reach forward, 4) Step and Reach Backwards,  5) Step and reach sideways, 6) Rock and reach forward/backward, 7) Rock and reach sideways.    Patient performed in the clinic from seated position with therapist cues and demonstration for proper form and technique.    Attempted exercises in standing this date however, he had increased difficulty, required increased assistance, increased rest breaks required. 5 reps of each standing exercise versus 10.              ADL: Establishment of Functional component tasks 1. sit to stand-Sit to stand with emphasis  on controlling decent from standing to sitting. Squats performed with therapist directing patient movements and working on depth of squat to improve performance with control. 2 sets of 5 repetitions each 2. stepping on and off a curb moderate assist 3. reaching down to manage shoelaces verbal cues provided 4.  Navigating in crowded areas without freezing moderate cues and variety of techniques used to break freezing pattern 5.  Reaching into the closet for clothing difficulty standing this date for longer periods of time          OT Education - 12/22/18 0839    Education Details  managing freezing of gait behaviors, daily exercises    Person(s) Educated  Patient    Methods  Explanation;Demonstration;Verbal cues;Handout    Comprehension  Verbalized understanding;Returned demonstration;Verbal  cues required          OT Long Term Goals - 12/07/18 1724      OT LONG TERM GOAL #1   Title  Patient will complete HEP for maximal daily exercises with modified independence in 4 weeks    Baseline  no current program at eval    Time  4    Period  Weeks    Status  New    Target Date  01/11/19      OT LONG TERM GOAL #2   Title  Patient will transfer from sit to stand without the use of arms safely and independently from a variety of lower chairs/surfaces in 4 weeks.    Baseline  difficulty with low surfaces    Time  4    Period  Weeks    Status  New    Target Date  01/11/19      OT LONG TERM GOAL #3   Title  Patient will demonstrate decreased freezing of gait behaviors with score of less than 10 on freezing of gait questionnaire.    Baseline  score of 12 at eval    Time  4    Period  Weeks    Status  New    Target Date  01/11/19      OT LONG TERM GOAL #4   Title  Patient will improve gait speed and endurance and be able to walk 525 feet in 6 minutes to negotiate around the home and community safely in 4 weeks    Baseline  Unable to perform 6 min test at eval, performed with PT 430 feet on 11/14/2018    Time  4    Period  Weeks    Status  New    Target Date  01/11/19      OT LONG TERM GOAL #5   Title  Patient will complete buttons with modified independence     Baseline  increased difficulty at eval    Time  4    Period  Weeks    Status  New    Target Date  01/11/19            Plan - 12/22/18 0840    Clinical Impression Statement  Patient with increased difficulty this date with all tasks.  He has been working to prepare for a special friend to come and visit for the Valentine's weekend and wants everything to be 'perfect".  We discussed triggers for freezing of gait and it appears patient may be anxious and mildly stressed.  Instructed on ways to streamline preparation, organization and reduce triggers for freezing of gait.  He demonstrates understanding of  principles and will work to apply  them over the next few days.  He required increased assist this date for exercises in standing and was limited with functional mobility skills.  He reports he adjusted his deep brain stimulator today in response.  Continue to work towards goals in [place] to increase safety and independence in necessary daily tasks.     Occupational Profile and client history currently impacting functional performance  history of 17 surgeries, multiple surgeries to both knees, recent foot surgery, lives alone, deep brain stimulator bilaterally. multiple comorbidities    Occupational performance deficits (Please refer to evaluation for details):  ADL's;IADL's;Leisure;Social Participation    Rehab Potential  Good    Current Impairments/barriers affecting progress:  recent decline in functional mobility, recent divorce, lives alone and needs to remain independent, history of falls    OT Frequency  4x / week    OT Duration  4 weeks    OT Treatment/Interventions  Self-care/ADL training;Therapeutic exercise;DME and/or AE instruction;Functional Mobility Training;Balance training;Neuromuscular education;Gait Training;Manual Therapy;Moist Heat;Energy conservation;Therapeutic activities;Patient/family education    Consulted and Agree with Plan of Care  Patient       Patient will benefit from skilled therapeutic intervention in order to improve the following deficits and impairments:  Abnormal gait, Decreased balance, Decreased endurance, Decreased mobility, Difficulty walking, Decreased range of motion, Decreased coordination, Decreased strength, Impaired flexibility, Impaired UE functional use  Visit Diagnosis: Difficulty in walking, not elsewhere classified  Muscle weakness (generalized)  Other lack of coordination  Unsteadiness on feet    Problem List Patient Active Problem List   Diagnosis Date Noted  . Essential hypertension, benign 09/15/2018  . Dyslipidemia 09/15/2018  .  Hypogonadism in male 06/18/2018  . Testicular cancer ([place]) 06/13/2018  . Hyperlipidemia 06/13/2018  . Depression 06/13/2018  . Anxiety 06/13/2018  . Callus of foot 03/27/2018  . Pes planovalgus, acquired, right 01/30/2018  . Arthritis of midfoot 01/30/2018  . Achilles tendon contracture, right 01/30/2018  . Left shoulder pain 07/26/2017  . Infection of prosthetic left knee joint ([place]) 07/10/2017  . 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[place]) 05/15/2017  . GERD (gastroesophageal reflux disease) 03/20/2017  . Primary osteoarthritis of one knee, right 12/30/2016  . H/O total knee replacement, left 12/30/2016  . Moderate recurrent major depression ([place]) 08/15/2016  . Chest pain due to myocardial ischemia 08/14/2016  . Chest pain, rule out acute myocardial infarction 08/14/2016  . Chest x-ray abnormality 10/17/2013  . Scoliosis 07/11/2012  . Arthritis of wrist, left, degenerative 07/11/2012  . Parkinson's disease ([place]) 01/07/2012   Sanav Remer T Tomasita Morrow, OTR/L, CLT  Riyaan Heroux 12/22/2018, 8:48 AM  [place] MAIN [facility] SERVICES [address] [place], [place], [address] Phone: [phone]   Fax:  [phone]  Name: Charles Choi MRN: [id number] Date of Birth: Jan 23, 1951

## 2018-12-22 NOTE — Therapy (Signed)
[place] MAIN [facility] SERVICES [address] [place], [place], [address] Phone: [phone]   Fax:  [phone]  Occupational Therapy Treatment  Patient Details  Name: Charles Choi MRN: [id number] Date of Birth: 07/06/1951 No data recorded  Encounter Date: 12/17/2018  OT End of Session - 12/22/18 0815    Visit Number  6    Number of Visits  17    Date for OT Re-Evaluation  01/11/19    Authorization Type  Medicare    OT Start Time  1302    OT Stop Time  1400    OT Time Calculation (min)  58 min    Activity Tolerance  Patient tolerated treatment well;Patient limited by fatigue    Behavior During Therapy  [facility] for tasks assessed/performed       Past Medical History:  Diagnosis Date  . Anxiety   . Cancer ([place])   . Depression   . Hypertension   . Parkinson's disease [facility])     Past Surgical History:  Procedure Laterality Date  . BACK SURGERY    . CHOLECYSTECTOMY    . DEEP BRAIN STIMULATOR PLACEMENT     for Parkinson's disease  . JOINT REPLACEMENT     left knee x2  . ORCHIECTOMY  1983    There were no vitals filed for this visit.  Subjective Assessment - 12/22/18 0814    Subjective   Monday patient states he had a good weekend and was able to do his exercises from a seated position twice a day each day. He feels a little sore like he has been exercising.     Pertinent History  Patient reports he was diagnosed 40 years ago with Parkinson's at the age of 79 and has been a natural progression, DBS Aug 2006 with dramatic changes, has it bilaterally.  Involuntary movements worse on left than right.     Patient Stated Goals  Patient would like to be able to sustain his independence as long as possible.      Currently in Pain?  Yes    Pain Score  2     Pain Location  Arm    Pain Orientation  Right    Pain Descriptors / Indicators  Aching    Pain Type  Chronic pain    Pain Onset  More than a month ago    Pain Frequency  Intermittent    Multiple Pain Sites  No       Neuromuscular Reeducation: Patient seen for initial instruction of LSVT BIG exercises: LSVT Daily Session Maximal Daily Exercises:  Sustained movements are designed to rescale the amplitude of movement output for generalization  to daily functional activities. Performed as follows for 1 set of 10 repetitions each: Multi directional  sustained movements- 1) Floor to ceiling, 2) Side to side. Multi directional Repetitive movements  performed in standing and are designed to provide retraining effort needed for sustained muscle  activation in tasks Performed as follows: 3) Step and reach forward, 4) Step and Reach Backwards,  5) Step and reach sideways, 6) Rock and reach forward/backward, 7) Rock and reach sideways.   Exercises above performed from a seated version and working towards improving amplitude of movement for UE and LE, cues for form and technique.   Transitioned to performing exercises in standing 3-7 with min assist from therapist for balance and weight shifting with use of chair for stability.   Functional mobility with rollator one trial of 200 feet with  cues for size of step length and weight shifting followed by rest break and another 75 feet.    ADL: Establishment of Functional component tasks 1. sit to stand-Sit to stand with emphasis on controlling decent from standing to sitting.  Squats performed with therapist directing patient movements and working on depth of squat to improve performance with control.   2. stepping on and off a curb 3. reaching down to manage shoelaces 4.  Navigating in crowded areas without freezing 5.  Reaching into the closet for clothing                     OT Education - 12/22/18 0815    Education Details  LSVT BIG maximal daily exercises seated version, functional component tasks    Person(s) Educated  Patient    Methods  Explanation;Demonstration;Verbal cues;Handout    Comprehension  Verbalized  understanding;Returned demonstration;Verbal cues required          OT Long Term Goals - 12/07/18 1724      OT LONG TERM GOAL #1   Title  Patient will complete HEP for maximal daily exercises with modified independence in 4 weeks    Baseline  no current program at eval    Time  4    Period  Weeks    Status  New    Target Date  01/11/19      OT LONG TERM GOAL #2   Title  Patient will transfer from sit to stand without the use of arms safely and independently from a variety of lower chairs/surfaces in 4 weeks.    Baseline  difficulty with low surfaces    Time  4    Period  Weeks    Status  New    Target Date  01/11/19      OT LONG TERM GOAL #3   Title  Patient will demonstrate decreased freezing of gait behaviors with score of less than 10 on freezing of gait questionnaire.    Baseline  score of 12 at eval    Time  4    Period  Weeks    Status  New    Target Date  01/11/19      OT LONG TERM GOAL #4   Title  Patient will improve gait speed and endurance and be able to walk 525 feet in 6 minutes to negotiate around the home and community safely in 4 weeks    Baseline  Unable to perform 6 min test at eval, performed with PT 430 feet on 11/14/2018    Time  4    Period  Weeks    Status  New    Target Date  01/11/19      OT LONG TERM GOAL #5   Title  Patient will complete buttons with modified independence     Baseline  increased difficulty at eval    Time  4    Period  Weeks    Status  New    Target Date  01/11/19            Plan - 12/22/18 0815    Clinical Impression Statement Patient continues to require exercises performed from a seated position at home for safety due to decreased balance. In the clinic he has performed them from a seated position and then performed standing exercises with therapist with use of a chair for an adapted version. Patient requires min assist for balance this date during adaptive exercises in standing. Patient continues  to demonstrate  difficulty with advancing right foot forward. Decreased weight shift to the left side. Continue to work towards goals and plan of care to increase balance, safety, strength and improve independence in daily tasks.     Occupational Profile and client history currently impacting functional performance  history of 17 surgeries, multiple surgeries to both knees, recent foot surgery, lives alone, deep brain stimulator bilaterally. multiple comorbidities    Occupational performance deficits (Please refer to evaluation for details):  ADL's;IADL's;Leisure;Social Participation    Rehab Potential  Good    Current Impairments/barriers affecting progress:  recent decline in functional mobility, recent divorce, lives alone and needs to remain independent, history of falls    OT Frequency  4x / week    OT Duration  4 weeks    OT Treatment/Interventions  Self-care/ADL training;Therapeutic exercise;DME and/or AE instruction;Functional Mobility Training;Balance training;Neuromuscular education;Gait Training;Manual Therapy;Moist Heat;Energy conservation;Therapeutic activities;Patient/family education    Consulted and Agree with Plan of Care  Patient       Patient will benefit from skilled therapeutic intervention in order to improve the following deficits and impairments:  Abnormal gait, Decreased balance, Decreased endurance, Decreased mobility, Difficulty walking, Decreased range of motion, Decreased coordination, Decreased strength, Impaired flexibility, Impaired UE functional use  Visit Diagnosis: Difficulty in walking, not elsewhere classified  Muscle weakness (generalized)  Other lack of coordination  Unsteadiness on feet    Problem List Patient Active Problem List   Diagnosis Date Noted  . Essential hypertension, benign 09/15/2018  . Dyslipidemia 09/15/2018  . Hypogonadism in male 06/18/2018  . Testicular cancer ([place]) 06/13/2018  . Hyperlipidemia 06/13/2018  . Depression 06/13/2018  . Anxiety  06/13/2018  . Callus of foot 03/27/2018  . Pes planovalgus, acquired, right 01/30/2018  . Arthritis of midfoot 01/30/2018  . Achilles tendon contracture, right 01/30/2018  . Left shoulder pain 07/26/2017  . Infection of prosthetic left knee joint ([place]) 07/10/2017  . 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[place]) 05/15/2017  . GERD (gastroesophageal reflux disease) 03/20/2017  . Primary osteoarthritis of one knee, right 12/30/2016  . H/O total knee replacement, left 12/30/2016  . Moderate recurrent major depression ([place]) 08/15/2016  . Chest pain due to myocardial ischemia 08/14/2016  . Chest pain, rule out acute myocardial infarction 08/14/2016  . Chest x-ray abnormality 10/17/2013  . Scoliosis 07/11/2012  . Arthritis of wrist, left, degenerative 07/11/2012  . Parkinson's disease ([place]) 01/07/2012   Amy T Tomasita Morrow, OTR/L, CLT  Lovett,Amy 12/22/2018, 8:17 AM  [place] MAIN [facility] SERVICES [address] [place], [place], [address] Phone: [phone]   Fax:  [phone]  Name: HAEDEN HUDOCK MRN: [id number] Date of Birth: [birth year]

## 2018-12-24 ENCOUNTER — Ambulatory Visit: Payer: Medicare Other | Admitting: Occupational Therapy

## 2018-12-24 ENCOUNTER — Encounter: Payer: Medicare Other | Admitting: Physical Therapy

## 2018-12-24 DIAGNOSIS — R2681 Unsteadiness on feet: Secondary | ICD-10-CM

## 2018-12-24 DIAGNOSIS — M6281 Muscle weakness (generalized): Secondary | ICD-10-CM

## 2018-12-24 DIAGNOSIS — R262 Difficulty in walking, not elsewhere classified: Secondary | ICD-10-CM | POA: Diagnosis not present

## 2018-12-24 DIAGNOSIS — R278 Other lack of coordination: Secondary | ICD-10-CM

## 2018-12-25 ENCOUNTER — Ambulatory Visit: Payer: Medicare Other | Admitting: Occupational Therapy

## 2018-12-25 DIAGNOSIS — R278 Other lack of coordination: Secondary | ICD-10-CM

## 2018-12-25 DIAGNOSIS — R262 Difficulty in walking, not elsewhere classified: Secondary | ICD-10-CM

## 2018-12-25 DIAGNOSIS — R2681 Unsteadiness on feet: Secondary | ICD-10-CM

## 2018-12-25 DIAGNOSIS — M6281 Muscle weakness (generalized): Secondary | ICD-10-CM

## 2018-12-26 ENCOUNTER — Encounter: Payer: Medicare Other | Admitting: Physical Therapy

## 2018-12-26 ENCOUNTER — Ambulatory Visit: Payer: Medicare Other | Admitting: Occupational Therapy

## 2018-12-26 DIAGNOSIS — R262 Difficulty in walking, not elsewhere classified: Secondary | ICD-10-CM | POA: Diagnosis not present

## 2018-12-26 DIAGNOSIS — R278 Other lack of coordination: Secondary | ICD-10-CM

## 2018-12-26 DIAGNOSIS — M6281 Muscle weakness (generalized): Secondary | ICD-10-CM

## 2018-12-26 DIAGNOSIS — R2681 Unsteadiness on feet: Secondary | ICD-10-CM

## 2018-12-27 ENCOUNTER — Ambulatory Visit: Payer: Medicare Other | Admitting: Occupational Therapy

## 2018-12-27 DIAGNOSIS — R262 Difficulty in walking, not elsewhere classified: Secondary | ICD-10-CM | POA: Diagnosis not present

## 2018-12-27 DIAGNOSIS — R2681 Unsteadiness on feet: Secondary | ICD-10-CM

## 2018-12-27 DIAGNOSIS — R278 Other lack of coordination: Secondary | ICD-10-CM

## 2018-12-27 DIAGNOSIS — M6281 Muscle weakness (generalized): Secondary | ICD-10-CM

## 2018-12-28 ENCOUNTER — Encounter: Payer: Self-pay | Admitting: Occupational Therapy

## 2018-12-28 NOTE — Therapy (Signed)
[place] MAIN [facility] SERVICES [address] [place], [place], [address] Phone: [phone]   Fax:  [phone]  Occupational Therapy Treatment  Patient Details  Name: Charles Choi MRN: [id number] Date of Birth: December 31, 1950 No data recorded  Encounter Date: 12/26/2018  OT End of Session - 12/28/18 1333    Visit Number  12    Number of Visits  17    Date for OT Re-Evaluation  01/11/19    OT Start Time  1300    OT Stop Time  1359    OT Time Calculation (min)  59 min    Activity Tolerance  Patient tolerated treatment well;Patient limited by fatigue    Behavior During Therapy  [facility] for tasks assessed/performed       Past Medical History:  Diagnosis Date  . Anxiety   . Cancer ([place])   . Depression   . Hypertension   . Parkinson's disease [facility])     Past Surgical History:  Procedure Laterality Date  . BACK SURGERY    . CHOLECYSTECTOMY    . DEEP BRAIN STIMULATOR PLACEMENT     for Parkinson's disease  . JOINT REPLACEMENT     left knee x2  . ORCHIECTOMY  1983    There were no vitals filed for this visit.  Subjective Assessment - 12/28/18 1330    Subjective   Patient reports each day this week has been better than the last, he spent time with his 17 yo grandson last night and was trying to fly a model plane.     Pertinent History  Patient reports he was diagnosed 40 years ago with Parkinson's at the age of 68 and has been a natural progression, DBS Aug 2006 with dramatic changes, has it bilaterally.  Involuntary movements worse on left than right.     Patient Stated Goals  Patient would like to be able to sustain his independence as long as possible.      Currently in Pain?  Yes    Pain Score  2     Pain Location  Arm    Pain Orientation  Right    Pain Descriptors / Indicators  Aching    Pain Type  Chronic pain    Pain Onset  More than a month ago    Pain Frequency  Intermittent       Neuromuscular reeducation: Patient seen  forinitialinstruction of LSVT BIG exercises: LSVT Daily Session Maximal Daily Exercises:  Sustained movements are designed to rescale the amplitude of movement output for generalization  to daily functional activities. Performed as follows for 1 set of 10 repetitions each: Multi directional  sustained movements- 1) Floor to ceiling, 2) Side to side. Multi directional Repetitive movements  performed in standing and are designed to provide retraining effort needed for sustained muscle activation in tasks Performed as follows: 3) Step and reach forward, 4) Step and Reach Backwards,  5) Step and reach sideways, 6) Rock and reach forward/backward, 7) Rock and reach sideways.  Patient performed in the clinic from seated position with occasional therapist cues, still requires assist with arm patterns versus lower extremity movements with stepping backwards pattern.   Exercises 3-7 repeated again in standing with minimal to CGA assist for balance and use of chair for an adapted version.Continued improvements each day in standing with weight shifting and stepping pattersn with right lower extremity.Able to advance the right leg forward and working towards amplitude of movement.     ADL: Functional  component tasks 1.sit to stand-Sit to stand with emphasis on controlling descent from standing to sitting. 2 sets of 5 repetitions each.  Omprovements noted with descent from standing. 2.stepping on and off a curbperformed in the clinic with simulated situation, patient needs to lead with left foot, unable to lead with right foot with this task. 3.reaching down to manage shoelacesverbal cues providedand crossed leg method utilized with therapist cues and mirroring of task 4. Navigating in crowded areas with minimal freezing, cues for anticipating response and focusing on continuing with movement pattern and allow people to navigate around him rather than him stopping and waiting for crowd to pass  which triggers response of freezing of gait. Cues to weight shift onto heels rather than toes during this task. 5. Reaching into the closet for clothingmin guard and cues              Attempted to add hand flicks to first 2 exercises this date to increase complexity however he has increased pain in his right forearm which he attributes to walker use.  recommended patient wear his forearm wrist support brace to see if this helps to eleviate some of the pain.  He continues to progress in all areas.  Significant improvement this week with exercises performed in standing in an adapted version for LSVT BIG.  Continue to work towards progression of exercises to impact balance and amplitude of movement patterns.          OT Education - 12/28/18 1333    Education Details  LSVT BIG maximal daily exercises, amplitude of movement and functional component tasks.     Person(s) Educated  Patient    Methods  Explanation;Demonstration;Verbal cues;Handout    Comprehension  Verbalized understanding;Returned demonstration;Verbal cues required          OT Long Term Goals - 12/28/18 0952      OT LONG TERM GOAL #1   Title  Patient will complete HEP for maximal daily exercises with modified independence in 4 weeks    Baseline  no current program at eval    Time  4    Period  Weeks    Status  On-going    Target Date  01/11/19      OT LONG TERM GOAL #2   Title  Patient will transfer from sit to stand without the use of arms safely and independently from a variety of lower chairs/surfaces in 4 weeks.    Baseline  difficulty with low surfaces    Time  4    Period  Weeks    Status  Partially Met    Target Date  01/11/19      OT LONG TERM GOAL #3   Title  Patient will demonstrate decreased freezing of gait behaviors with score of less than 10 on freezing of gait questionnaire.    Baseline  score of 12 at eval    Time  4    Period  Weeks    Status  On-going    Target Date  01/11/19       OT LONG TERM GOAL #4   Title  Patient will improve gait speed and endurance and be able to walk 525 feet in 6 minutes to negotiate around the home and community safely in 4 weeks    Baseline  Unable to perform 6 min test at eval, performed with PT 430 feet on 11/14/2018, 12-24-18 unable to perform test this date    Time  4  Period  Weeks    Status  On-going    Target Date  01/11/19      OT LONG TERM GOAL #5   Title  Patient will complete buttons with modified independence     Baseline  increased difficulty at eval    Time  4    Period  Weeks    Status  On-going    Target Date  01/11/19            Plan - 12/28/18 1334    Clinical Impression Statement  Attempted to add hand flicks to first 2 exercises this date to increase complexity however he has increased pain in his right forearm which he attributes to walker use.  recommended patient wear his forearm wrist support brace to see if this helps to eleviate some of the pain.  He continues to progress in all areas.  Significant improvement this week with exercises performed in standing in an adapted version for LSVT BIG.  Continue to work towards progression of exercises to impact balance and amplitude of movement patterns.      Occupational Profile and client history currently impacting functional performance  history of 17 surgeries, multiple surgeries to both knees, recent foot surgery, lives alone, deep brain stimulator bilaterally. multiple comorbidities    Occupational performance deficits (Please refer to evaluation for details):  ADL's;IADL's;Leisure;Social Participation    Rehab Potential  Good    Current Impairments/barriers affecting progress:  recent decline in functional mobility, recent divorce, lives alone and needs to remain independent, history of falls    OT Frequency  4x / week    OT Duration  4 weeks    OT Treatment/Interventions  Self-care/ADL training;Therapeutic exercise;DME and/or AE instruction;Functional  Mobility Training;Balance training;Neuromuscular education;Gait Training;Manual Therapy;Moist Heat;Energy conservation;Therapeutic activities;Patient/family education    Consulted and Agree with Plan of Care  Patient       Patient will benefit from skilled therapeutic intervention in order to improve the following deficits and impairments:  Abnormal gait, Decreased balance, Decreased endurance, Decreased mobility, Difficulty walking, Decreased range of motion, Decreased coordination, Decreased strength, Impaired flexibility, Impaired UE functional use  Visit Diagnosis: Muscle weakness (generalized)  Unsteadiness on feet  Other lack of coordination  Difficulty in walking, not elsewhere classified    Problem List Patient Active Problem List   Diagnosis Date Noted  . Essential hypertension, benign 09/15/2018  . Dyslipidemia 09/15/2018  . Hypogonadism in male 06/18/2018  . Testicular cancer ([place]) 06/13/2018  . Hyperlipidemia 06/13/2018  . Depression 06/13/2018  . Anxiety 06/13/2018  . Callus of foot 03/27/2018  . Pes planovalgus, acquired, right 01/30/2018  . Arthritis of midfoot 01/30/2018  . Achilles tendon contracture, right 01/30/2018  . Left shoulder pain 07/26/2017  . Infection of prosthetic left knee joint ([place]) 07/10/2017  . 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[place]) 05/15/2017  . GERD (gastroesophageal reflux disease) 03/20/2017  . Primary osteoarthritis of one knee, right 12/30/2016  . H/O total knee replacement, left 12/30/2016  . Moderate recurrent major depression ([place]) 08/15/2016  . Chest pain due to myocardial ischemia 08/14/2016  . Chest pain, rule out acute myocardial infarction 08/14/2016  . Chest x-ray abnormality 10/17/2013  . Scoliosis 07/11/2012  . Arthritis of wrist, left, degenerative 07/11/2012  . Parkinson's disease ([place]) 01/07/2012   Amy T Tomasita Morrow, OTR/L, CLT  Lovett,Amy 12/28/2018, 1:39 PM  [place] MAIN [facility]  SERVICES [address] [place], [place], [address] Phone: [phone]   Fax:  [phone]  Name:  Charles Choi MRN: [id number] Date of Birth: Aug 28, 1951

## 2018-12-28 NOTE — Therapy (Signed)
[facility] [address] [place], [place], [address] Phone: [phone]   Fax:  [phone]  Occupational Therapy Treatment/10th visit Reporting period from 12/07/2018 to 12/24/2018  Patient Details  Name: Charles Choi MRN: [id number] Date of Birth: Sep 09, 1951 No data recorded  Encounter Date: 12/24/2018  OT End of Session - 12/28/18 0949    Visit Number  10    Number of Visits  17    Date for OT Re-Evaluation  01/11/19    Authorization Type  Medicare    OT Start Time  1304    OT Stop Time  1400    OT Time Calculation (min)  56 min    Activity Tolerance  Patient tolerated treatment well;Patient limited by fatigue    Behavior During Therapy  [facility] for tasks assessed/performed       Past Medical History:  Diagnosis Date  . Anxiety   . Cancer ([place])   . Depression   . Hypertension   . Parkinson's disease [facility])     Past Surgical History:  Procedure Laterality Date  . BACK SURGERY    . CHOLECYSTECTOMY    . DEEP BRAIN STIMULATOR PLACEMENT     for Parkinson's disease  . JOINT REPLACEMENT     left knee x2  . ORCHIECTOMY  1983    There were no vitals filed for this visit.  Subjective Assessment - 12/28/18 0944    Subjective   Patient reports he had an awesome weekend, spending time with a friend who came into town.  He reports he did not do his exercises for the last 2 days.  "I know I have to get back into the swing of things"    Pertinent History  Patient reports he was diagnosed 40 years ago with Parkinson's at the age of 61 and has been a natural progression, DBS Aug 2006 with dramatic changes, has it bilaterally.  Involuntary movements worse on left than right.     Patient Stated Goals  Patient would like to be able to sustain his independence as long as possible.      Currently in Pain?  No/denies    Pain Score  0-No pain        Goals updated to reflect current progress. Reassessment of skills-5 times sit to  stand 14 secs, freezing of gait score 11, unable to perform 6 minute walk test today.   Last 4 minute walk test he was able to complete 280 feet.  Neuromuscular reeducation: Patient seen forinitialinstruction of LSVT BIG exercises: LSVT Daily Session Maximal Daily Exercises:  Sustained movements are designed to rescale the amplitude of movement output for generalization  to daily functional activities. Performed as follows for 1 set of 10 repetitions each: Multi directional  sustained movements- 1) Floor to ceiling, 2) Side to side. Multi directional Repetitive movements  performed in standing and are designed to provide retraining effort needed for sustained muscle activation in tasks Performed as follows: 3) Step and reach forward, 4) Step and Reach Backwards,  5) Step and reach sideways, 6) Rock and reach forward/backward, 7) Rock and reach sideways.  Patient performed in the clinic from seated position with therapist cues and demonstration for proper form and technique but now able to recall sequence of exercises.  Exercises 3-7 repeated again in standing with minimal assist for balance and use of chair for an adapted version. Continued improvements in standing with weight shifting and stepping pattersn with right lower extremity.  Functional mobility with rollator this date, 2 trials of 50 feet with cues for amplitude of gait, patient had to sit frequently and could not advance with functional mobility due to fatigue.  ADL: Establishment of Functional component tasks 1.sit to stand-Sit to stand with emphasis on controlling decent from standing to sitting. Squats performed with therapist directing patient movements and working on depth of squat to improve performance with control.2 sets of 5 repetitions each 2.stepping on and off a curbmoderate assist 3.reaching down to manage shoelacesverbal cues provided and crossed leg method utilized with therapist demo 4. Navigating  in crowded areas without freezingmoderate cues and variety of techniques used to break freezing pattern 5. Reaching into the closet for clothingmin guard and cues                   OT Education - 12/28/18 0945    Education Details  amplitude of gait, daily exercises.    Person(s) Educated  Patient    Methods  Explanation;Demonstration;Verbal cues;Handout    Comprehension  Verbalized understanding;Returned demonstration;Verbal cues required          OT Long Term Goals - 12/28/18 0952      OT LONG TERM GOAL #1   Title  Patient will complete HEP for maximal daily exercises with modified independence in 4 weeks    Baseline  no current program at eval    Time  4    Period  Weeks    Status  On-going    Target Date  01/11/19      OT LONG TERM GOAL #2   Title  Patient will transfer from sit to stand without the use of arms safely and independently from a variety of lower chairs/surfaces in 4 weeks.    Baseline  difficulty with low surfaces    Time  4    Period  Weeks    Status  Partially Met    Target Date  01/11/19      OT LONG TERM GOAL #3   Title  Patient will demonstrate decreased freezing of gait behaviors with score of less than 10 on freezing of gait questionnaire.    Baseline  score of 12 at eval    Time  4    Period  Weeks    Status  On-going    Target Date  01/11/19      OT LONG TERM GOAL #4   Title  Patient will improve gait speed and endurance and be able to walk 525 feet in 6 minutes to negotiate around the home and community safely in 4 weeks    Baseline  Unable to perform 6 min test at eval, performed with PT 430 feet on 11/14/2018, 12-24-18 unable to perform test this date    Time  4    Period  Weeks    Status  On-going    Target Date  01/11/19      OT LONG TERM GOAL #5   Title  Patient will complete buttons with modified independence     Baseline  increased difficulty at eval    Time  4    Period  Weeks    Status  On-going    Target Date   01/11/19            Plan - 12/28/18 0950    Clinical Impression Statement  Patient progressing in all areas, although today was a difficult day for him.  He was fatigued and had a busy weekend with  a friend coming to visit.  His activity level was greater over the weekend that it has been on a daily basis and he had difficulty with walking today. Will plan to perform 6 minute walk test this week when patient is able.  Patient has progressed with his performance with daily exercises, he is able to recall sequence of exercises with very minimal to no verbal cues in regard to sequence but continues to require cues for technique and form of exercises especially with amplitude of movements.  He has been able to perform exercises in standing with therapist and use of chair for exercises in an adapted version.  Balance remains impaired but has been able to weight shift and advance right foot with greater consistency.  Continue to work towards goals in [place] to maximize safety and independence in daily tasks.       Occupational Profile and client history currently impacting functional performance  history of 17 surgeries, multiple surgeries to both knees, recent foot surgery, lives alone, deep brain stimulator bilaterally. multiple comorbidities    Occupational performance deficits (Please refer to evaluation for details):  ADL's;IADL's;Leisure;Social Participation    Rehab Potential  Good    Current Impairments/barriers affecting progress:  recent decline in functional mobility, recent divorce, lives alone and needs to remain independent, history of falls    OT Frequency  4x / week    OT Duration  4 weeks    OT Treatment/Interventions  Self-care/ADL training;Therapeutic exercise;DME and/or AE instruction;Functional Mobility Training;Balance training;Neuromuscular education;Gait Training;Manual Therapy;Moist Heat;Energy conservation;Therapeutic activities;Patient/family education    Consulted and Agree with  Plan of Care  Patient       Patient will benefit from skilled therapeutic intervention in order to improve the following deficits and impairments:  Abnormal gait, Decreased balance, Decreased endurance, Decreased mobility, Difficulty walking, Decreased range of motion, Decreased coordination, Decreased strength, Impaired flexibility, Impaired UE functional use  Visit Diagnosis: Unsteadiness on feet  Muscle weakness (generalized)  Other lack of coordination  Difficulty in walking, not elsewhere classified    Problem List Patient Active Problem List   Diagnosis Date Noted  . Essential hypertension, benign 09/15/2018  . Dyslipidemia 09/15/2018  . Hypogonadism in male 06/18/2018  . Testicular cancer ([place]) 06/13/2018  . Hyperlipidemia 06/13/2018  . Depression 06/13/2018  . Anxiety 06/13/2018  . Callus of foot 03/27/2018  . Pes planovalgus, acquired, right 01/30/2018  . Arthritis of midfoot 01/30/2018  . Achilles tendon contracture, right 01/30/2018  . Left shoulder pain 07/26/2017  . Infection of prosthetic left knee joint ([place]) 07/10/2017  . 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[place]) 05/15/2017  . GERD (gastroesophageal reflux disease) 03/20/2017  . Primary osteoarthritis of one knee, right 12/30/2016  . H/O total knee replacement, left 12/30/2016  . Moderate recurrent major depression ([place]) 08/15/2016  . Chest pain due to myocardial ischemia 08/14/2016  . Chest pain, rule out acute myocardial infarction 08/14/2016  . Chest x-ray abnormality 10/17/2013  . Scoliosis 07/11/2012  . Arthritis of wrist, left, degenerative 07/11/2012  . Parkinson's disease ([place]) 01/07/2012    T Tomasita Morrow, OTR/L, CLT  , 12/28/2018, 10:08 AM  [place] MAIN [facility] SERVICES [address] [place], [place], [address] Phone: [phone]   Fax:  [phone]  Name: Charles Choi MRN: [id number] Date of Birth: [birth year]

## 2018-12-28 NOTE — Therapy (Signed)
[place] MAIN [facility] SERVICES [address] [place], [place], [address] Phone: [phone]   Fax:  [phone]  Occupational Therapy Treatment  Patient Details  Name: Charles Choi MRN: [id number] Date of Birth: May 07, 1951 No data recorded  Encounter Date: 12/27/2018  OT End of Session - 12/28/18 1405    Visit Number  13    Number of Visits  17    Date for OT Re-Evaluation  01/11/19    Authorization Type  Medicare    OT Start Time  1226    OT Stop Time  1329    OT Time Calculation (min)  63 min    Activity Tolerance  Patient tolerated treatment well;Patient limited by fatigue    Behavior During Therapy  [facility] for tasks assessed/performed       Past Medical History:  Diagnosis Date  . Anxiety   . Cancer ([place])   . Depression   . Hypertension   . Parkinson's disease [facility])     Past Surgical History:  Procedure Laterality Date  . BACK SURGERY    . CHOLECYSTECTOMY    . DEEP BRAIN STIMULATOR PLACEMENT     for Parkinson's disease  . JOINT REPLACEMENT     left knee x2  . ORCHIECTOMY  1983    There were no vitals filed for this visit.  Subjective Assessment - 12/28/18 1404    Pertinent History  Patient reports he was diagnosed 40 years ago with Parkinson's at the age of 75 and has been a natural progression, DBS Aug 2006 with dramatic changes, has it bilaterally.  Involuntary movements worse on left than right.     Patient Stated Goals  Patient would like to be able to sustain his independence as long as possible.      Currently in Pain?  Yes    Pain Score  2     Pain Location  Arm    Pain Orientation  Right    Pain Descriptors / Indicators  Aching    Pain Type  Chronic pain    Pain Onset  More than a month ago    Pain Frequency  Intermittent    Multiple Pain Sites  No       Neuromuscular reeducation: Patient seen for initial instruction of LSVT BIG exercises: LSVT Daily Session Maximal Daily Exercises:  Sustained movements  are designed to rescale the amplitude of movement output for generalization  to daily functional activities. Performed as follows for 1 set of 10 repetitions each: Multi directional  sustained movements- 1) Floor to ceiling, 2) Side to side. Multi directional Repetitive movements  performed in standing and are designed to provide retraining effort needed for sustained muscle  activation in tasks Performed as follows: 3) Step and reach forward, 4) Step and Reach Backwards,  5) Step and reach sideways, 6) Rock and reach forward/backward, 7) Rock and reach sideways.    Patient performed in the clinic from seated position with minimal cues.     Exercises 3-7 repeated in standing with minimal to CGA assist for balance and use of chair for an adapted version. Continued improvements in standing with weight shifting and stepping patterns with right lower extremity. Continues to improve with ability to advance the right leg forward and working towards amplitude of movement.   Today has been the best day of performance of exercises overall since starting therapy.  He is aware he needs to perform 2 times a day for the next 3 days  until he returns on Monday.       ADL: Functional component tasks 1. sit to stand-Sit to stand with emphasis on controlling decent from standing to sitting.  2 sets of 5 repetitions each 2. stepping on and off a curb performed in the clinic with simulated situation, patient needs to lead with left foot, unable to lead with right foot with this task. 3. reaching down to manage shoelaces verbal cues provided and crossed leg method utilized with therapist cues and mirroring of task 4.  Navigating in crowded areas with minimal freezing, cues for anticipating response and focusing on continuing with movement pattern and allow people to navigate around him rather than him stopping and waiting for crowd to pass which triggers response of freezing of gait. Cues to weight shift onto heels  rather than toes during this task. 5.  Reaching into the closet for clothing min guard and cues      Functional mobility with rollator for 500 feet with short rest breaks as needed, cues for amplitude of gait and strategies to decrease freezing of gait behaviors.                  OT Education - 12/28/18 1405    Education Details  LSVT BIG maximal daily exercises, amplitude of movement and functional component tasks.     Person(s) Educated  Patient    Methods  Explanation;Demonstration;Verbal cues;Handout    Comprehension  Verbalized understanding;Returned demonstration;Verbal cues required          OT Long Term Goals - 12/28/18 0952      OT LONG TERM GOAL #1   Title  Patient will complete HEP for maximal daily exercises with modified independence in 4 weeks    Baseline  no current program at eval    Time  4    Period  Weeks    Status  On-going    Target Date  01/11/19      OT LONG TERM GOAL #2   Title  Patient will transfer from sit to stand without the use of arms safely and independently from a variety of lower chairs/surfaces in 4 weeks.    Baseline  difficulty with low surfaces    Time  4    Period  Weeks    Status  Partially Met    Target Date  01/11/19      OT LONG TERM GOAL #3   Title  Patient will demonstrate decreased freezing of gait behaviors with score of less than 10 on freezing of gait questionnaire.    Baseline  score of 12 at eval    Time  4    Period  Weeks    Status  On-going    Target Date  01/11/19      OT LONG TERM GOAL #4   Title  Patient will improve gait speed and endurance and be able to walk 525 feet in 6 minutes to negotiate around the home and community safely in 4 weeks    Baseline  Unable to perform 6 min test at eval, performed with PT 430 feet on 11/14/2018, 12-24-18 unable to perform test this date    Time  4    Period  Weeks    Status  On-going    Target Date  01/11/19      OT LONG TERM GOAL #5   Title  Patient will  complete buttons with modified independence     Baseline  increased difficulty at eval  Time  4    Period  Weeks    Status  On-going    Target Date  01/11/19            Plan - 12/28/18 1406    Clinical Impression Statement  Patient has continued to make good progress in all areas.  He has advanced with his exercises and performing better with exercises in standing, decreased cues required for technique.  He demonstrates improved sit to stand with increased control from a variety of surfaces as long as he is focused on the task.  Will continue to work towards increasing complexity of tasks into the next week.      Occupational Profile and client history currently impacting functional performance  history of 17 surgeries, multiple surgeries to both knees, recent foot surgery, lives alone, deep brain stimulator bilaterally. multiple comorbidities    Occupational performance deficits (Please refer to evaluation for details):  ADL's;IADL's;Leisure;Social Participation    Rehab Potential  Good    Current Impairments/barriers affecting progress:  recent decline in functional mobility, recent divorce, lives alone and needs to remain independent, history of falls    OT Frequency  4x / week    OT Duration  4 weeks    OT Treatment/Interventions  Self-care/ADL training;Therapeutic exercise;DME and/or AE instruction;Functional Mobility Training;Balance training;Neuromuscular education;Gait Training;Manual Therapy;Moist Heat;Energy conservation;Therapeutic activities;Patient/family education    Consulted and Agree with Plan of Care  Patient       Patient will benefit from skilled therapeutic intervention in order to improve the following deficits and impairments:  Abnormal gait, Decreased balance, Decreased endurance, Decreased mobility, Difficulty walking, Decreased range of motion, Decreased coordination, Decreased strength, Impaired flexibility, Impaired UE functional use  Visit  Diagnosis: Unsteadiness on feet  Muscle weakness (generalized)  Other lack of coordination  Difficulty in walking, not elsewhere classified    Problem List Patient Active Problem List   Diagnosis Date Noted  . Essential hypertension, benign 09/15/2018  . Dyslipidemia 09/15/2018  . Hypogonadism in male 06/18/2018  . Testicular cancer ([place]) 06/13/2018  . Hyperlipidemia 06/13/2018  . Depression 06/13/2018  . Anxiety 06/13/2018  . Callus of foot 03/27/2018  . Pes planovalgus, acquired, right 01/30/2018  . Arthritis of midfoot 01/30/2018  . Achilles tendon contracture, right 01/30/2018  . Left shoulder pain 07/26/2017  . Infection of prosthetic left knee joint ([place]) 07/10/2017  . 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[place]) 05/15/2017  . GERD (gastroesophageal reflux disease) 03/20/2017  . Primary osteoarthritis of one knee, right 12/30/2016  . H/O total knee replacement, left 12/30/2016  . Moderate recurrent major depression ([place]) 08/15/2016  . Chest pain due to myocardial ischemia 08/14/2016  . Chest pain, rule out acute myocardial infarction 08/14/2016  . Chest x-ray abnormality 10/17/2013  . Scoliosis 07/11/2012  . Arthritis of wrist, left, degenerative 07/11/2012  . Parkinson's disease [facility]) 01/07/2012   Larsen Dungan T Tomasita Morrow, OTR/L, CLT  Dantonio Justen 12/28/2018, 4:48 PM  [place] MAIN [facility] SERVICES [address] [place], [place], [address] Phone: [phone]   Fax:  [phone]  Name: SAMARI GORBY MRN: [id number] Date of Birth: [birth year]

## 2018-12-28 NOTE — Therapy (Signed)
[place] MAIN [facility] SERVICES [address] [place], [place], [address] Phone: [phone]   Fax:  [phone]  Occupational Therapy Treatment  Patient Details  Name: Charles Choi MRN: [id number] Date of Birth: 12-18-1950 No data recorded  Encounter Date: 12/25/2018  OT End of Session - 12/28/18 1122    Visit Number  11    Number of Visits  17    Date for OT Re-Evaluation  01/11/19    Authorization Type  Medicare    OT Start Time  0254    OT Stop Time  1403    OT Time Calculation (min)  56 min    Activity Tolerance  Patient tolerated treatment well;Patient limited by fatigue    Behavior During Therapy  [facility] for tasks assessed/performed       Past Medical History:  Diagnosis Date  . Anxiety   . Cancer ([place])   . Depression   . Hypertension   . Parkinson's disease [facility])     Past Surgical History:  Procedure Laterality Date  . BACK SURGERY    . CHOLECYSTECTOMY    . DEEP BRAIN STIMULATOR PLACEMENT     for Parkinson's disease  . JOINT REPLACEMENT     left knee x2  . ORCHIECTOMY  1983    There were no vitals filed for this visit.  Subjective Assessment - 12/28/18 1120    Subjective   Patient reports he is feeling better today, slept better last night from being so tired but admits he never sleeps well.      Pertinent History  Patient reports he was diagnosed 40 years ago with Parkinson's at the age of 52 and has been a natural progression, DBS Aug 2006 with dramatic changes, has it bilaterally.  Involuntary movements worse on left than right.     Patient Stated Goals  Patient would like to be able to sustain his independence as long as possible.      Currently in Pain?  No/denies    Pain Score  0-No pain      6 minute walk test 630 feet with use of rollator  Neuromuscular reeducation: Patient seen forinitialinstruction of LSVT BIG exercises: LSVT Daily Session Maximal Daily Exercises:  Sustained movements are designed to  rescale the amplitude of movement output for generalization  to daily functional activities. Performed as follows for 1 set of 10 repetitions each: Multi directional  sustained movements- 1) Floor to ceiling, 2) Side to side. Multi directional Repetitive movements  performed in standing and are designed to provide retraining effort needed for sustained muscle activation in tasks Performed as follows: 3) Step and reach forward, 4) Step and Reach Backwards,  5) Step and reach sideways, 6) Rock and reach forward/backward, 7) Rock and reach sideways.  Patient performed in the clinic from seated position with therapist cues and demonstration for proper form and technique but now able to recall sequence of exercises, still requires assist with arm patterns versus lower extremity movements  Exercises 3-7 repeated again in standing with minimal assist for balance and use of chair for an adapted version. Continued improvements in standing with weight shifting and stepping pattersn with right lower extremity. Able to advance the right leg forward and working towards amplitude of movement.     ADL: Functional component tasks 1.sit to stand-Sit to stand with emphasis on controlling decent from standing to sitting. 2 sets of 5 repetitions each 2.stepping on and off a curbperformed in the clinic with simulated  situation, patient needs to lead with left foot, unable to lead with right foot with this task. 3.reaching down to manage shoelacesverbal cues provided and crossed leg method utilized with therapist cues and mirroring of task 4. Navigating in crowded areas with minimal freezing, cues for anticipating response and focusing on continuing with movement pattern and allow people to navigate around him rather than him stopping and waiting for crowd to pass which triggers response of freezing of gait. Cues to weight shift onto heels rather than toes during this task. 5. Reaching into the closet for  clothingmin guard and cues                     OT Education - 12/28/18 1121    Education Details  LSVT BIG maximal daily exercises, amplitude of movement and functional component tasks.     Person(s) Educated  Patient    Methods  Explanation;Demonstration;Verbal cues;Handout    Comprehension  Verbalized understanding;Returned demonstration;Verbal cues required          OT Long Term Goals - 12/28/18 0952      OT LONG TERM GOAL #1   Title  Patient will complete HEP for maximal daily exercises with modified independence in 4 weeks    Baseline  no current program at eval    Time  4    Period  Weeks    Status  On-going    Target Date  01/11/19      OT LONG TERM GOAL #2   Title  Patient will transfer from sit to stand without the use of arms safely and independently from a variety of lower chairs/surfaces in 4 weeks.    Baseline  difficulty with low surfaces    Time  4    Period  Weeks    Status  Partially Met    Target Date  01/11/19      OT LONG TERM GOAL #3   Title  Patient will demonstrate decreased freezing of gait behaviors with score of less than 10 on freezing of gait questionnaire.    Baseline  score of 12 at eval    Time  4    Period  Weeks    Status  On-going    Target Date  01/11/19      OT LONG TERM GOAL #4   Title  Patient will improve gait speed and endurance and be able to walk 525 feet in 6 minutes to negotiate around the home and community safely in 4 weeks    Baseline  Unable to perform 6 min test at eval, performed with PT 430 feet on 11/14/2018, 12-24-18 unable to perform test this date    Time  4    Period  Weeks    Status  On-going    Target Date  01/11/19      OT LONG TERM GOAL #5   Title  Patient will complete buttons with modified independence     Baseline  increased difficulty at eval    Time  4    Period  Weeks    Status  On-going    Target Date  01/11/19            Plan - 12/28/18 1122    Clinical Impression  Statement  Patient was able to complete his 6 minute walk test this date, 630 feet and exceeded his goal.  He has continued to progress with daily exercises and may be able to complete from an adapted version  soon at home, for now he continues to complete from a seated version at home.  Stepping patterns on the right have improved with the ability to advance the foot forwards and now trying to work towards the size of step and quality of movement.  Continue to work towards goals to improve balance, coordination and strength for daily tasks to continue to live alone.     Occupational Profile and client history currently impacting functional performance  history of 17 surgeries, multiple surgeries to both knees, recent foot surgery, lives alone, deep brain stimulator bilaterally. multiple comorbidities    Occupational performance deficits (Please refer to evaluation for details):  ADL's;IADL's;Leisure;Social Participation    Rehab Potential  Good    Current Impairments/barriers affecting progress:  recent decline in functional mobility, recent divorce, lives alone and needs to remain independent, history of falls    OT Frequency  4x / week    OT Duration  4 weeks    OT Treatment/Interventions  Self-care/ADL training;Therapeutic exercise;DME and/or AE instruction;Functional Mobility Training;Balance training;Neuromuscular education;Gait Training;Manual Therapy;Moist Heat;Energy conservation;Therapeutic activities;Patient/family education    Consulted and Agree with Plan of Care  Patient       Patient will benefit from skilled therapeutic intervention in order to improve the following deficits and impairments:  Abnormal gait, Decreased balance, Decreased endurance, Decreased mobility, Difficulty walking, Decreased range of motion, Decreased coordination, Decreased strength, Impaired flexibility, Impaired UE functional use  Visit Diagnosis: Unsteadiness on feet  Muscle weakness (generalized)  Other  lack of coordination  Difficulty in walking, not elsewhere classified    Problem List Patient Active Problem List   Diagnosis Date Noted  . Essential hypertension, benign 09/15/2018  . Dyslipidemia 09/15/2018  . Hypogonadism in male 06/18/2018  . Testicular cancer ([place]) 06/13/2018  . Hyperlipidemia 06/13/2018  . Depression 06/13/2018  . Anxiety 06/13/2018  . Callus of foot 03/27/2018  . Pes planovalgus, acquired, right 01/30/2018  . Arthritis of midfoot 01/30/2018  . Achilles tendon contracture, right 01/30/2018  . Left shoulder pain 07/26/2017  . Infection of prosthetic left knee joint ([place]) 07/10/2017  . 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[place]) 05/15/2017  . GERD (gastroesophageal reflux disease) 03/20/2017  . Primary osteoarthritis of one knee, right 12/30/2016  . H/O total knee replacement, left 12/30/2016  . Moderate recurrent major depression ([place]) 08/15/2016  . Chest pain due to myocardial ischemia 08/14/2016  . Chest pain, rule out acute myocardial infarction 08/14/2016  . Chest x-ray abnormality 10/17/2013  . Scoliosis 07/11/2012  . Arthritis of wrist, left, degenerative 07/11/2012  . Parkinson's disease ([place]) 01/07/2012   Amy T Tomasita Morrow, OTR/L, CLT  Lovett,Amy 12/28/2018, 11:27 AM  [place] MAIN [facility] SERVICES [address] [place], [place], [address] Phone: [phone]   Fax:  [phone]  Name: RIVEN MABILE MRN: [id number] Date of Birth: 23-Jul-1951

## 2018-12-31 ENCOUNTER — Encounter: Payer: Medicare Other | Admitting: Physical Therapy

## 2018-12-31 ENCOUNTER — Ambulatory Visit: Payer: Medicare Other | Admitting: Occupational Therapy

## 2018-12-31 DIAGNOSIS — R278 Other lack of coordination: Secondary | ICD-10-CM

## 2018-12-31 DIAGNOSIS — R262 Difficulty in walking, not elsewhere classified: Secondary | ICD-10-CM

## 2018-12-31 DIAGNOSIS — M6281 Muscle weakness (generalized): Secondary | ICD-10-CM

## 2018-12-31 DIAGNOSIS — R2681 Unsteadiness on feet: Secondary | ICD-10-CM

## 2019-01-01 ENCOUNTER — Ambulatory Visit: Payer: Medicare Other | Admitting: Occupational Therapy

## 2019-01-01 ENCOUNTER — Encounter: Payer: Self-pay | Admitting: Occupational Therapy

## 2019-01-01 DIAGNOSIS — R278 Other lack of coordination: Secondary | ICD-10-CM

## 2019-01-01 DIAGNOSIS — R262 Difficulty in walking, not elsewhere classified: Secondary | ICD-10-CM

## 2019-01-01 DIAGNOSIS — R2681 Unsteadiness on feet: Secondary | ICD-10-CM

## 2019-01-01 DIAGNOSIS — M6281 Muscle weakness (generalized): Secondary | ICD-10-CM

## 2019-01-01 NOTE — Therapy (Signed)
[place] MAIN [facility] SERVICES [address] [place], [place], [address] Phone: [phone]   Fax:  [phone]  Occupational Therapy Treatment  Patient Details  Name: Charles Choi MRN: [id number] Date of Birth: 04-May-1951 No data recorded  Encounter Date: 12/31/2018  OT End of Session - 01/05/19 0904    Visit Number  14    Number of Visits  17    Date for OT Re-Evaluation  01/11/19    Authorization Type  Medicare    OT Start Time  1258    OT Stop Time  1359    OT Time Calculation (min)  61 min    Activity Tolerance  Patient tolerated treatment well;Patient limited by fatigue    Behavior During Therapy  [facility] for tasks assessed/performed       Past Medical History:  Diagnosis Date  . Anxiety   . Cancer ([place])   . Depression   . Hypertension   . Parkinson's disease [facility])     Past Surgical History:  Procedure Laterality Date  . BACK SURGERY    . CHOLECYSTECTOMY    . DEEP BRAIN STIMULATOR PLACEMENT     for Parkinson's disease  . JOINT REPLACEMENT     left knee x2  . ORCHIECTOMY  1983    There were no vitals filed for this visit.  Subjective Assessment - 01/05/19 0904    Subjective   Patient reports he had a good weekend, is going tomorrow to an ortho appt at [facility] about his left shoulder to see if he might require surgery.     Pertinent History  Patient reports he was diagnosed 40 years ago with Parkinson's at the age of 45 and has been a natural progression, DBS Aug 2006 with dramatic changes, has it bilaterally.  Involuntary movements worse on left than right.     Patient Stated Goals  Patient would like to be able to sustain his independence as long as possible.         Neuromuscular reeducation: Patient seen for initial instruction of LSVT BIG exercises: LSVT Daily Session Maximal Daily Exercises:  Sustained movements are designed to rescale the amplitude of movement output for generalization  to daily functional activities.  Performed as follows for 1 set of 10 repetitions each: Multi directional  sustained movements- 1) Floor to ceiling, 2) Side to side. Multi directional Repetitive movements  performed in standing and are designed to provide retraining effort needed for sustained muscle  activation in tasks Performed as follows: 3) Step and reach forward, 4) Step and Reach Backwards,  5) Step and reach sideways, 6) Rock and reach forward/backward, 7) Rock and reach sideways.    Exercises performed this date from an adapted version, with exercises 3-7 performed in standing with CGA for balance and use of chair.  Therapist providing occasional cues for technique and coordination of UE and LE patterns of movement.  Cues to increase amplitude.     ADL: Functional component tasks 1. sit to stand-Sit to stand with emphasis on controlling decent from standing to sitting.  2 sets of 5 repetitions each with cues for controlling movement. 2. stepping on and off a curb performed in the clinic with simulated situation, patient needs to lead with left foot, unable to lead with right foot with this task, continued focus 3. reaching down to manage shoelaces verbal cues provided and crossed leg method utilized with therapist cues  4.  Navigating in crowded areas with minimal freezing, cues  for anticipating response and focusing on continuing with movement pattern and allow people to navigate around him rather than him stopping and waiting for crowd to pass which triggers response of freezing of gait. Improved weight shift to heels this date when freezing of gait is surfacing. 5.  Reaching into the closet for clothing min guard    Functional mobility with rollator for 250 feet 2 sets with short rest breaks as needed, cues for amplitude of gait and strategies to decrease freezing of gait behaviors.                     Patient continues to progress with advancing to exercises in an adapted version.  He was able to  perform safely this date with the use of a chair, therefore therapist issued handouts for home program in an adapted version.  Patient to complete one more set of exercises today at home in the adapted version.  He demonstrates improved weight shifting patterns and able to advance right foot for stepping patterns.  Continue to work towards goals to increase independence in daily tasks to continue to live alone.      OT Education - 01/05/19 0904    Education Details  LSVT BIG maximal daily exercises in adapted version, amplitude of movement and functional component tasks.     Person(s) Educated  Patient    Methods  Explanation;Demonstration;Verbal cues;Handout    Comprehension  Verbalized understanding;Returned demonstration;Verbal cues required          OT Long Term Goals - 12/28/18 0952      OT LONG TERM GOAL #1   Title  Patient will complete HEP for maximal daily exercises with modified independence in 4 weeks    Baseline  no current program at eval    Time  4    Period  Weeks    Status  On-going    Target Date  01/11/19      OT LONG TERM GOAL #2   Title  Patient will transfer from sit to stand without the use of arms safely and independently from a variety of lower chairs/surfaces in 4 weeks.    Baseline  difficulty with low surfaces    Time  4    Period  Weeks    Status  Partially Met    Target Date  01/11/19      OT LONG TERM GOAL #3   Title  Patient will demonstrate decreased freezing of gait behaviors with score of less than 10 on freezing of gait questionnaire.    Baseline  score of 12 at eval    Time  4    Period  Weeks    Status  On-going    Target Date  01/11/19      OT LONG TERM GOAL #4   Title  Patient will improve gait speed and endurance and be able to walk 525 feet in 6 minutes to negotiate around the home and community safely in 4 weeks    Baseline  Unable to perform 6 min test at eval, performed with PT 430 feet on 11/14/2018, 12-24-18 unable to perform test  this date    Time  4    Period  Weeks    Status  On-going    Target Date  01/11/19      OT LONG TERM GOAL #5   Title  Patient will complete buttons with modified independence     Baseline  increased difficulty at eval  Time  4    Period  Weeks    Status  On-going    Target Date  01/11/19            Plan - 01/05/19 0240    Clinical Impression Statement  Patient continues to progress with advancing to exercises in an adapted version.  He was able to perform safely this date with the use of a chair, therefore therapist issued handouts for home program in an adapted version.  Patient to complete one more set of exercises today at home in the adapted version.  He demonstrates improved weight shifting patterns and able to advance right foot for stepping patterns.  Continue to work towards goals to increase independence in daily tasks to continue to live alone.     Occupational Profile and client history currently impacting functional performance  history of 17 surgeries, multiple surgeries to both knees, recent foot surgery, lives alone, deep brain stimulator bilaterally. multiple comorbidities    Occupational performance deficits (Please refer to evaluation for details):  ADL's;IADL's;Leisure;Social Participation    Rehab Potential  Good    Current Impairments/barriers affecting progress:  recent decline in functional mobility, recent divorce, lives alone and needs to remain independent, history of falls    OT Frequency  4x / week    OT Duration  4 weeks    OT Treatment/Interventions  Self-care/ADL training;Therapeutic exercise;DME and/or AE instruction;Functional Mobility Training;Balance training;Neuromuscular education;Gait Training;Manual Therapy;Moist Heat;Energy conservation;Therapeutic activities;Patient/family education    Consulted and Agree with Plan of Care  Patient       Patient will benefit from skilled therapeutic intervention in order to improve the following deficits  and impairments:  Abnormal gait, Decreased balance, Decreased endurance, Decreased mobility, Difficulty walking, Decreased range of motion, Decreased coordination, Decreased strength, Impaired flexibility, Impaired UE functional use  Visit Diagnosis: Unsteadiness on feet  Muscle weakness (generalized)  Other lack of coordination  Difficulty in walking, not elsewhere classified    Problem List Patient Active Problem List   Diagnosis Date Noted  . Essential hypertension, benign 09/15/2018  . Dyslipidemia 09/15/2018  . Hypogonadism in male 06/18/2018  . Testicular cancer ([place]) 06/13/2018  . Hyperlipidemia 06/13/2018  . Depression 06/13/2018  . Anxiety 06/13/2018  . Callus of foot 03/27/2018  . Pes planovalgus, acquired, right 01/30/2018  . Arthritis of midfoot 01/30/2018  . Achilles tendon contracture, right 01/30/2018  . Left shoulder pain 07/26/2017  . Infection of prosthetic left knee joint ([place]) 07/10/2017  . 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[place]) 05/15/2017  . GERD (gastroesophageal reflux disease) 03/20/2017  . Primary osteoarthritis of one knee, right 12/30/2016  . H/O total knee replacement, left 12/30/2016  . Moderate recurrent major depression ([place]) 08/15/2016  . Chest pain due to myocardial ischemia 08/14/2016  . Chest pain, rule out acute myocardial infarction 08/14/2016  . Chest x-ray abnormality 10/17/2013  . Scoliosis 07/11/2012  . Arthritis of wrist, left, degenerative 07/11/2012  . Parkinson's disease ([place]) 01/07/2012   Annalena Piatt T Tomasita Morrow, OTR/L, CLT  Sione Baumgarten 01/05/2019, 9:12 AM  [place] MAIN [facility] SERVICES [address] [place], [place], [address] Phone: [phone]   Fax:  [phone]  Name: CLIFFORD BENNINGER MRN: [id number] Date of Birth: 1950/11/21

## 2019-01-02 ENCOUNTER — Ambulatory Visit: Payer: Medicare Other | Admitting: Occupational Therapy

## 2019-01-02 ENCOUNTER — Encounter: Payer: Medicare Other | Admitting: Physical Therapy

## 2019-01-02 DIAGNOSIS — R262 Difficulty in walking, not elsewhere classified: Secondary | ICD-10-CM

## 2019-01-02 DIAGNOSIS — R2681 Unsteadiness on feet: Secondary | ICD-10-CM

## 2019-01-02 DIAGNOSIS — M6281 Muscle weakness (generalized): Secondary | ICD-10-CM

## 2019-01-02 DIAGNOSIS — R278 Other lack of coordination: Secondary | ICD-10-CM

## 2019-01-03 ENCOUNTER — Ambulatory Visit: Payer: Medicare Other | Admitting: Occupational Therapy

## 2019-01-03 DIAGNOSIS — R262 Difficulty in walking, not elsewhere classified: Secondary | ICD-10-CM | POA: Diagnosis not present

## 2019-01-03 DIAGNOSIS — M6281 Muscle weakness (generalized): Secondary | ICD-10-CM

## 2019-01-03 DIAGNOSIS — R2681 Unsteadiness on feet: Secondary | ICD-10-CM

## 2019-01-03 DIAGNOSIS — R278 Other lack of coordination: Secondary | ICD-10-CM

## 2019-01-05 ENCOUNTER — Encounter: Payer: Self-pay | Admitting: Occupational Therapy

## 2019-01-05 NOTE — Therapy (Signed)
[place] MAIN [facility] SERVICES [address] [place], [place], [address] Phone: [phone]   Fax:  [phone]  Occupational Therapy Treatment  Patient Details  Name: Charles Choi MRN: [id number] Date of Birth: [birth year] No data recorded  Encounter Date: 01/01/2019  OT End of Session - 01/05/19 0915    Visit Number  15    Number of Visits  17    Date for OT Re-Evaluation  01/11/19    Authorization Type  Medicare    OT Start Time  1301    OT Stop Time  1400    OT Time Calculation (min)  59 min    Activity Tolerance  Patient tolerated treatment well;Patient limited by fatigue    Behavior During Therapy  [facility] for tasks assessed/performed       Past Medical History:  Diagnosis Date  . Anxiety   . Cancer ([place])   . Depression   . Hypertension   . Parkinson's disease [facility])     Past Surgical History:  Procedure Laterality Date  . BACK SURGERY    . CHOLECYSTECTOMY    . DEEP BRAIN STIMULATOR PLACEMENT     for Parkinson's disease  . JOINT REPLACEMENT     left knee x2  . ORCHIECTOMY  1983    There were no vitals filed for this visit.  Subjective Assessment - 01/05/19 0914    Subjective   Patient reports he was able to complete exercises last night in the adapted version, reports he is pleased with his progress.     Pertinent History  Patient reports he was diagnosed 40 years ago with Parkinson's at the age of 21 and has been a natural progression, DBS Aug 2006 with dramatic changes, has it bilaterally.  Involuntary movements worse on left than right.     Patient Stated Goals  Patient would like to be able to sustain his independence as long as possible.      Currently in Pain?  No/denies    Pain Score  0-No pain       Neuromuscular reeducation: Patient seen for initial instruction of LSVT BIG exercises: LSVT Daily Session Maximal Daily Exercises:  Sustained movements are designed to rescale the amplitude of movement output for  generalization  to daily functional activities. Performed as follows for 1 set of 10 repetitions each: Multi directional  sustained movements- 1) Floor to ceiling, 2) Side to side. Multi directional Repetitive movements  performed in standing and are designed to provide retraining effort needed for sustained muscle  activation in tasks Performed as follows: 3) Step and reach forward, 4) Step and Reach Backwards,  5) Step and reach sideways, 6) Rock and reach forward/backward, 7) Rock and reach sideways.    Exercises performed this date from an adapted version, with exercises 3-7 performed in standing with CGA for balance and use of chair.  Therapist providing occasional cues for technique and coordination of UE and LE patterns of movement.  Cues to increase amplitude.     ADL: Functional component tasks 1. sit to stand-Sit to stand with emphasis on controlling decent from standing to sitting.  2 sets of 5 repetitions each with cues for controlling movement. 2. stepping on and off a curb performed in the clinic with simulated situation, patient needs to lead with left foot, unable to lead with right foot with this task, continued focus 3. reaching down to manage shoelaces verbal cues provided and crossed leg method utilized with therapist cues  4.  Navigating in crowded areas with minimal freezing, cues for anticipating response and focusing on continuing with movement pattern and allow people to navigate around him rather than him stopping and waiting for crowd to pass which triggers response of freezing of gait. Improved weight shift to heels this date when freezing of gait is surfacing. 5.  Reaching into the closet for clothing min guard    Functional mobility with rollator for 275 feet 2 sets with short rest breaks as needed, cues for amplitude of gait and strategies to decrease freezing of gait behaviors.                 Patient has been transitioning this week from performing  daily exercises from a seated position to a standing position (adapted version with the use of a chair) in the clinic and at home.  He requires occasional cues for technique and therapist providing min guard in the clinic when focusing on increasing amplitude of steps and movement pattern.  We discontinued the use of hand flicks since this was aggravating his right forearm.  Patient continues to make excellent progress with intensive portion of LSVT BIG program and nearing the end of the intensive portion of his program the end of this week.        OT Education - 01/05/19 0915    Education Details  LSVT BIG maximal daily exercises in adapted version, amplitude of movement     Person(s) Educated  Patient    Methods  Explanation;Demonstration;Verbal cues;Handout    Comprehension  Verbalized understanding;Returned demonstration;Verbal cues required          OT Long Term Goals - 12/28/18 0952      OT LONG TERM GOAL #1   Title  Patient will complete HEP for maximal daily exercises with modified independence in 4 weeks    Baseline  no current program at eval    Time  4    Period  Weeks    Status  On-going    Target Date  01/11/19      OT LONG TERM GOAL #2   Title  Patient will transfer from sit to stand without the use of arms safely and independently from a variety of lower chairs/surfaces in 4 weeks.    Baseline  difficulty with low surfaces    Time  4    Period  Weeks    Status  Partially Met    Target Date  01/11/19      OT LONG TERM GOAL #3   Title  Patient will demonstrate decreased freezing of gait behaviors with score of less than 10 on freezing of gait questionnaire.    Baseline  score of 12 at eval    Time  4    Period  Weeks    Status  On-going    Target Date  01/11/19      OT LONG TERM GOAL #4   Title  Patient will improve gait speed and endurance and be able to walk 525 feet in 6 minutes to negotiate around the home and community safely in 4 weeks    Baseline   Unable to perform 6 min test at eval, performed with PT 430 feet on 11/14/2018, 12-24-18 unable to perform test this date    Time  4    Period  Weeks    Status  On-going    Target Date  01/11/19      OT LONG TERM GOAL #5   Title  Patient will complete buttons with modified independence     Baseline  increased difficulty at eval    Time  4    Period  Weeks    Status  On-going    Target Date  01/11/19            Plan - 01/05/19 1761    Clinical Impression Statement  Patient has been transitioning this week from performing daily exercises from a seated position to a standing position (adapted version with the use of a chair) in the clinic and at home.  He requires occasional cues for technique and therapist providing min guard in the clinic when focusing on increasing amplitude of steps and movement pattern.  We discontinued the use of hand flicks since this was aggravating his right forearm.  Patient continues to make excellent progress with intensive portion of LSVT BIG program and nearing the end of the intensive portion of his program the end of this week.      Occupational Profile and client history currently impacting functional performance  history of 17 surgeries, multiple surgeries to both knees, recent foot surgery, lives alone, deep brain stimulator bilaterally. multiple comorbidities    Occupational performance deficits (Please refer to evaluation for details):  ADL's;IADL's;Leisure;Social Participation    Rehab Potential  Good    Current Impairments/barriers affecting progress:  recent decline in functional mobility, recent divorce, lives alone and needs to remain independent, history of falls    OT Frequency  4x / week    OT Duration  4 weeks    OT Treatment/Interventions  Self-care/ADL training;Therapeutic exercise;DME and/or AE instruction;Functional Mobility Training;Balance training;Neuromuscular education;Gait Training;Manual Therapy;Moist Heat;Energy  conservation;Therapeutic activities;Patient/family education    Consulted and Agree with Plan of Care  Patient       Patient will benefit from skilled therapeutic intervention in order to improve the following deficits and impairments:  Abnormal gait, Decreased balance, Decreased endurance, Decreased mobility, Difficulty walking, Decreased range of motion, Decreased coordination, Decreased strength, Impaired flexibility, Impaired UE functional use  Visit Diagnosis: Muscle weakness (generalized)  Other lack of coordination  Unsteadiness on feet  Difficulty in walking, not elsewhere classified    Problem List Patient Active Problem List   Diagnosis Date Noted  . Essential hypertension, benign 09/15/2018  . Dyslipidemia 09/15/2018  . Hypogonadism in male 06/18/2018  . Testicular cancer ([place]) 06/13/2018  . Hyperlipidemia 06/13/2018  . Depression 06/13/2018  . Anxiety 06/13/2018  . Callus of foot 03/27/2018  . Pes planovalgus, acquired, right 01/30/2018  . Arthritis of midfoot 01/30/2018  . Achilles tendon contracture, right 01/30/2018  . Left shoulder pain 07/26/2017  . Infection of prosthetic left knee joint ([place]) 07/10/2017  . 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[place]) 05/15/2017  . GERD (gastroesophageal reflux disease) 03/20/2017  . Primary osteoarthritis of one knee, right 12/30/2016  . H/O total knee replacement, left 12/30/2016  . Moderate recurrent major depression ([place]) 08/15/2016  . Chest pain due to myocardial ischemia 08/14/2016  . Chest pain, rule out acute myocardial infarction 08/14/2016  . Chest x-ray abnormality 10/17/2013  . Scoliosis 07/11/2012  . Arthritis of wrist, left, degenerative 07/11/2012  . Parkinson's disease ([place]) 01/07/2012   Sheena Simonis T Tomasita Morrow, OTR/L, CLT  Marshayla Mitschke 01/05/2019, 9:21 AM  [place] MAIN [facility] SERVICES [address] [place], [place], [address] Phone: [phone]   Fax:  [phone]  Name:  Charles Choi MRN: [id number] Date of Birth: [birth year]

## 2019-01-05 NOTE — Therapy (Signed)
[place] MAIN [facility] SERVICES [address] [place], [place], [address] Phone: [phone]   Fax:  [phone]  Occupational Therapy Treatment  Patient Details  Name: Charles Choi MRN: [id number] Date of Birth: 1951-11-05 No data recorded  Encounter Date: 01/02/2019  OT End of Session - 01/05/19 1108    Visit Number  16    Number of Visits  17    Date for OT Re-Evaluation  01/11/19    Authorization Type  Medicare    OT Start Time  1302    OT Stop Time  1400    OT Time Calculation (min)  58 min    Activity Tolerance  Patient tolerated treatment well;Patient limited by fatigue    Behavior During Therapy  [facility] for tasks assessed/performed       Past Medical History:  Diagnosis Date  . Anxiety   . Cancer ([place])   . Depression   . Hypertension   . Parkinson's disease [facility])     Past Surgical History:  Procedure Laterality Date  . BACK SURGERY    . CHOLECYSTECTOMY    . DEEP BRAIN STIMULATOR PLACEMENT     for Parkinson's disease  . JOINT REPLACEMENT     left knee x2  . ORCHIECTOMY  1983    There were no vitals filed for this visit.  Subjective Assessment - 01/05/19 1107    Subjective   Patient reports he is doing well with the exercises.  No complaints    Pertinent History  Patient reports he was diagnosed 40 years ago with Parkinson's at the age of 43 and has been a natural progression, DBS Aug 2006 with dramatic changes, has it bilaterally.  Involuntary movements worse on left than right.     Patient Stated Goals  Patient would like to be able to sustain his independence as long as possible.      Currently in Pain?  No/denies    Pain Score  0-No pain          Neuromuscular reeducation: Patient seen forinitialinstruction of LSVT BIG exercises: LSVT Daily Session Maximal Daily Exercises:  Sustained movements are designed to rescale the amplitude of movement output for generalization  to daily functional activities. Performed  as follows for 1 set of 10 repetitions each: Multi directional  sustained movements- 1) Floor to ceiling, 2) Side to side. Multi directional Repetitive movements  performed in standing and are designed to provide retraining effort needed for sustained muscle activation in tasks Performed as follows: 3) Step and reach forward, 4) Step and Reach Backwards,  5) Step and reach sideways, 6) Rock and reach forward/backward, 7) Rock and reach sideways.  Exercises performed this date from an adapted version, with exercises 3-7 performed in standing with CGA for balance and use of chair.  Therapist providing occasional cues for technique and coordination of UE and LE patterns of movement.  Cues to increase amplitude.    ADL: Functional component tasks 1.sit to stand-Sit to stand with emphasis on controlling decent from standing to sitting. 2 sets of 5 repetitions each with cues for controlling movement. 2.stepping on and off a curbperformed in the clinic with simulated situation, patient needs to lead with left foot, unable to lead with right foot with this task, continued focus 3.reaching down to manage shoelacesverbal cues providedand crossed leg method utilized with therapist cues  4. Navigating in crowded areas with minimal freezing, cues for anticipating response and focusing on continuing with movement pattern and  allow people to navigate around him rather than him stopping and waiting for crowd to pass which triggers response of freezing of gait. Improved weight shift to heels this date when freezing of gait is surfacing. 5. Reaching into the closet for clothingmin guard   Functional mobility with rollator for 300 feet 2 sets with short rest breaks as needed, cues for amplitude of gait and strategies to decrease freezing of gait behaviors.                  OT Education - 01/05/19 1108    Education Details  LSVT BIG maximal daily exercises in adapted version,  amplitude of movement     Person(s) Educated  Patient    Methods  Explanation;Demonstration;Verbal cues;Handout    Comprehension  Verbalized understanding;Returned demonstration;Verbal cues required          OT Long Term Goals - 12/28/18 0952      OT LONG TERM GOAL #1   Title  Patient will complete HEP for maximal daily exercises with modified independence in 4 weeks    Baseline  no current program at eval    Time  4    Period  Weeks    Status  On-going    Target Date  01/11/19      OT LONG TERM GOAL #2   Title  Patient will transfer from sit to stand without the use of arms safely and independently from a variety of lower chairs/surfaces in 4 weeks.    Baseline  difficulty with low surfaces    Time  4    Period  Weeks    Status  Partially Met    Target Date  01/11/19      OT LONG TERM GOAL #3   Title  Patient will demonstrate decreased freezing of gait behaviors with score of less than 10 on freezing of gait questionnaire.    Baseline  score of 12 at eval    Time  4    Period  Weeks    Status  On-going    Target Date  01/11/19      OT LONG TERM GOAL #4   Title  Patient will improve gait speed and endurance and be able to walk 525 feet in 6 minutes to negotiate around the home and community safely in 4 weeks    Baseline  Unable to perform 6 min test at eval, performed with PT 430 feet on 11/14/2018, 12-24-18 unable to perform test this date    Time  4    Period  Weeks    Status  On-going    Target Date  01/11/19      OT LONG TERM GOAL #5   Title  Patient will complete buttons with modified independence     Baseline  increased difficulty at eval    Time  4    Period  Weeks    Status  On-going    Target Date  01/11/19            Plan - 01/05/19 1109    Clinical Impression Statement  Patient continues to work towards transitioning to performing exercises from an adapted version which is a level greater than he has been performing from a seated level.  Increased  challenge for balance in standing over the last few sessions.  Will reassess goals and perform outcome measures next session with the end of the intensive portion of LSVT BIG program.      Occupational Profile and  client history currently impacting functional performance  history of 17 surgeries, multiple surgeries to both knees, recent foot surgery, lives alone, deep brain stimulator bilaterally. multiple comorbidities    Occupational performance deficits (Please refer to evaluation for details):  ADL's;IADL's;Leisure;Social Participation    Current Impairments/barriers affecting progress:  recent decline in functional mobility, recent divorce, lives alone and needs to remain independent, history of falls    OT Frequency  4x / week    OT Duration  4 weeks    OT Treatment/Interventions  Self-care/ADL training;Therapeutic exercise;DME and/or AE instruction;Functional Mobility Training;Balance training;Neuromuscular education;Gait Training;Manual Therapy;Moist Heat;Energy conservation;Therapeutic activities;Patient/family education    Consulted and Agree with Plan of Care  Patient       Patient will benefit from skilled therapeutic intervention in order to improve the following deficits and impairments:  Abnormal gait, Decreased balance, Decreased endurance, Decreased mobility, Difficulty walking, Decreased range of motion, Decreased coordination, Decreased strength, Impaired flexibility, Impaired UE functional use  Visit Diagnosis: Muscle weakness (generalized)  Other lack of coordination  Unsteadiness on feet  Difficulty in walking, not elsewhere classified    Problem List Patient Active Problem List   Diagnosis Date Noted  . Essential hypertension, benign 09/15/2018  . Dyslipidemia 09/15/2018  . Hypogonadism in male 06/18/2018  . Testicular cancer ([place]) 06/13/2018  . Hyperlipidemia 06/13/2018  . Depression 06/13/2018  . Anxiety 06/13/2018  . Callus of foot 03/27/2018  . Pes  planovalgus, acquired, right 01/30/2018  . Arthritis of midfoot 01/30/2018  . Achilles tendon contracture, right 01/30/2018  . Left shoulder pain 07/26/2017  . Infection of prosthetic left knee joint ([place]) 07/10/2017  . 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[place]) 05/15/2017  . GERD (gastroesophageal reflux disease) 03/20/2017  . Primary osteoarthritis of one knee, right 12/30/2016  . H/O total knee replacement, left 12/30/2016  . Moderate recurrent major depression ([place]) 08/15/2016  . Chest pain due to myocardial ischemia 08/14/2016  . Chest pain, rule out acute myocardial infarction 08/14/2016  . Chest x-ray abnormality 10/17/2013  . Scoliosis 07/11/2012  . Arthritis of wrist, left, degenerative 07/11/2012  . Parkinson's disease [facility]) 01/07/2012   Sunya Humbarger T Tomasita Morrow, OTR/L, CLT  Terel Bann 01/05/2019, 3:54 PM  [place] MAIN [facility] SERVICES [address] [place], [place], [address] Phone: [phone]   Fax:  [phone]  Name: Charles Choi MRN: [id number] Date of Birth: 12-09-1950

## 2019-01-05 NOTE — Therapy (Signed)
[place] MAIN [facility] SERVICES [address] [place], [place], [address] Phone: [phone]   Fax:  [phone]  Occupational Therapy Treatment  Patient Details  Name: Charles Choi MRN: [id number] Date of Birth: 1951-01-20 No data recorded  Encounter Date: 01/03/2019    Past Medical History:  Diagnosis Date  . Anxiety   . Cancer ([place])   . Depression   . Hypertension   . Parkinson's disease [facility])     Past Surgical History:  Procedure Laterality Date  . BACK SURGERY    . CHOLECYSTECTOMY    . DEEP BRAIN STIMULATOR PLACEMENT     for Parkinson's disease  . JOINT REPLACEMENT     left knee x2  . ORCHIECTOMY  1983    There were no vitals filed for this visit.                             OT Long Term Goals - 12/28/18 5188      OT LONG TERM GOAL #1   Title  Patient will complete HEP for maximal daily exercises with modified independence in 4 weeks    Baseline  no current program at eval    Time  4    Period  Weeks    Status  On-going    Target Date  01/11/19      OT LONG TERM GOAL #2   Title  Patient will transfer from sit to stand without the use of arms safely and independently from a variety of lower chairs/surfaces in 4 weeks.    Baseline  difficulty with low surfaces    Time  4    Period  Weeks    Status  Partially Met    Target Date  01/11/19      OT LONG TERM GOAL #3   Title  Patient will demonstrate decreased freezing of gait behaviors with score of less than 10 on freezing of gait questionnaire.    Baseline  score of 12 at eval    Time  4    Period  Weeks    Status  On-going    Target Date  01/11/19      OT LONG TERM GOAL #4   Title  Patient will improve gait speed and endurance and be able to walk 525 feet in 6 minutes to negotiate around the home and community safely in 4 weeks    Baseline  Unable to perform 6 min test at eval, performed with PT 430 feet on 11/14/2018, 12-24-18 unable to  perform test this date    Time  4    Period  Weeks    Status  On-going    Target Date  01/11/19      OT LONG TERM GOAL #5   Title  Patient will complete buttons with modified independence     Baseline  increased difficulty at eval    Time  4    Period  Weeks    Status  On-going    Target Date  01/11/19              Patient will benefit from skilled therapeutic intervention in order to improve the following deficits and impairments:     Visit Diagnosis: Muscle weakness (generalized)  Other lack of coordination  Unsteadiness on feet  Difficulty in walking, not elsewhere classified    Problem List Patient Active Problem List   Diagnosis Date Noted  .  Essential hypertension, benign 09/15/2018  . Dyslipidemia 09/15/2018  . Hypogonadism in male 06/18/2018  . Testicular cancer ([place]) 06/13/2018  . Hyperlipidemia 06/13/2018  . Depression 06/13/2018  . Anxiety 06/13/2018  . Callus of foot 03/27/2018  . Pes planovalgus, acquired, right 01/30/2018  . Arthritis of midfoot 01/30/2018  . Achilles tendon contracture, right 01/30/2018  . Left shoulder pain 07/26/2017  . Infection of prosthetic left knee joint ([place]) 07/10/2017  . 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[place]) 05/15/2017  . GERD (gastroesophageal reflux disease) 03/20/2017  . Primary osteoarthritis of one knee, right 12/30/2016  . H/O total knee replacement, left 12/30/2016  . Moderate recurrent major depression ([place]) 08/15/2016  . Chest pain due to myocardial ischemia 08/14/2016  . Chest pain, rule out acute myocardial infarction 08/14/2016  . Chest x-ray abnormality 10/17/2013  . Scoliosis 07/11/2012  . Arthritis of wrist, left, degenerative 07/11/2012  . Parkinson's disease ([place]) 01/07/2012    Charles Choi 01/05/2019, 4:02 PM  [place] MAIN [facility] SERVICES [address] [place], [place], [address] Phone: [phone]   Fax:  [phone]  Name: Charles Choi MRN:  [id number] Date of Birth: 12-24-1950

## 2019-01-05 NOTE — Therapy (Signed)
[place] MAIN [facility] SERVICES [address] [place], [place], [address] Phone: [phone]   Fax:  [phone]  Occupational Therapy Treatment  Patient Details  Name: Charles Choi MRN: [id number] Date of Birth: 01-16-1951 No data recorded  Encounter Date: 01/03/2019  OT End of Session - 01/05/19 2059    Visit Number  17    Number of Visits  17    Date for OT Re-Evaluation  01/11/19    Authorization Type  Medicare    OT Start Time  1300    OT Stop Time  1400    OT Time Calculation (min)  60 min    Activity Tolerance  Patient tolerated treatment well;Patient limited by fatigue    Behavior During Therapy  [facility] for tasks assessed/performed       Past Medical History:  Diagnosis Date  . Anxiety   . Cancer ([place])   . Depression   . Hypertension   . Parkinson's disease [facility])     Past Surgical History:  Procedure Laterality Date  . BACK SURGERY    . CHOLECYSTECTOMY    . DEEP BRAIN STIMULATOR PLACEMENT     for Parkinson's disease  . JOINT REPLACEMENT     left knee x2  . ORCHIECTOMY  1983    There were no vitals filed for this visit.  Subjective Assessment - 01/05/19 2059    Subjective   Patient reports he is pleased with his progress and would like to continue to try to improve his performance with maximal daily exercises in standing.     Pertinent History  Patient reports he was diagnosed 40 years ago with Parkinson's at the age of 56 and has been a natural progression, DBS Aug 2006 with dramatic changes, has it bilaterally.  Involuntary movements worse on left than right.     Patient Stated Goals  Patient would like to be able to sustain his independence as long as possible.      Currently in Pain?  No/denies    Pain Score  0-No pain       Neuromuscular reeducation: Patient seen for initial instruction of LSVT BIG exercises: LSVT Daily Session Maximal Daily Exercises:  Sustained movements are designed to rescale the amplitude of  movement output for generalization  to daily functional activities. Performed as follows for 1 set of 10 repetitions each: Multi directional  sustained movements- 1) Floor to ceiling, 2) Side to side. Multi directional Repetitive movements  performed in standing and are designed to provide retraining effort needed for sustained muscle  activation in tasks Performed as follows: 3) Step and reach forward, 4) Step and Reach Backwards,  5) Step and reach sideways, 6) Rock and reach forward/backward, 7) Rock and reach sideways.   Performed in adapted version with CGA and occasional cues for improving amplitude of movement.     ADL: Functional component tasks 1. sit to stand-Sit to stand with control  2 sets of 5 repetitions each 2. stepping on and off a curb performed in the clinic with simulated situation, patient needs to lead with left foot, unable to lead with right foot with this task. 3. reaching down to manage shoelaces crossed leg method utilized modified independent 4.  Navigating in crowded areas with minimal freezing, cues for anticipating response and focusing on continuing with movement pattern and allow people to navigate around him rather than him stopping and waiting for crowd to pass which triggers response of freezing of gait. Cues to  weight shift onto heels rather than toes during this task. 5.  Reaching into the closet for clothing modified independent     6 minute walk test 670 feet 5 times sit to stand 15 secs Freezing of gait questionairre 12                OT Education - 01/05/19 2059    Education Details  LSVT BIG maximal daily exercises in adapted version, amplitude of movement     Person(s) Educated  Patient    Methods  Explanation;Demonstration;Verbal cues;Handout    Comprehension  Verbalized understanding;Returned demonstration;Verbal cues required          OT Long Term Goals - 01/05/19 2106      OT LONG TERM GOAL #1   Title  Patient will  complete HEP for maximal daily exercises with modified independence in 4 weeks    Baseline  no current program at eval    Time  4    Period  Weeks    Status  Achieved      OT LONG TERM GOAL #2   Title  Patient will transfer from sit to stand without the use of arms safely and independently from a variety of lower chairs/surfaces in 4 weeks.    Baseline  difficulty with low surfaces    Time  4    Period  Weeks    Status  Achieved      OT LONG TERM GOAL #3   Title  Patient will demonstrate decreased freezing of gait behaviors with score of less than 10 on freezing of gait questionnaire.    Baseline  score of 12 at eval    Time  4    Period  Weeks    Status  On-going      OT LONG TERM GOAL #4   Title  Patient will improve gait speed and endurance and be able to walk 525 feet in 6 minutes to negotiate around the home and community safely in 4 weeks    Baseline  Unable to perform 6 min test at eval, performed with PT 430 feet on 11/14/2018, 12-24-18 unable to perform test this date, 01/03/19: 670 feet    Time  4    Period  Weeks    Status  Achieved      OT LONG TERM GOAL #5   Title  Patient will complete buttons with modified independence     Baseline  increased difficulty at eval    Time  4    Period  Weeks    Status  On-going            Plan - 01/05/19 2100    Clinical Impression Statement  Patient has completed the intensive portion of LSVT BIG program and patient has made significant gains. He improved his 6 minute walk test from 230 feet to 670 feet with improved amplitude of steps, improved balance and quality of movement, decreased freezing of gait.  He has progressed from performing maximal daily exercises from a seated version to an adapted version this week.  The quality of his movements have improved and he is consistently able to perform sit to stand without the use of arms from a variety of surfaces. He would benefit from continued OT to focus on advancements with  exercises, balance and to further work towards reducing freezing of gait.      Occupational Profile and client history currently impacting functional performance  history of 17 surgeries, multiple surgeries to  both knees, recent foot surgery, lives alone, deep brain stimulator bilaterally. multiple comorbidities    Occupational performance deficits (Please refer to evaluation for details):  ADL's;IADL's;Leisure;Social Participation    Rehab Potential  Good    Current Impairments/barriers affecting progress:  recent decline in functional mobility, recent divorce, lives alone and needs to remain independent, history of falls    OT Frequency  4x / week    OT Duration  4 weeks    OT Treatment/Interventions  Self-care/ADL training;Therapeutic exercise;DME and/or AE instruction;Functional Mobility Training;Balance training;Neuromuscular education;Gait Training;Manual Therapy;Moist Heat;Energy conservation;Therapeutic activities;Patient/family education    Consulted and Agree with Plan of Care  Patient       Patient will benefit from skilled therapeutic intervention in order to improve the following deficits and impairments:  Abnormal gait, Decreased balance, Decreased endurance, Decreased mobility, Difficulty walking, Decreased range of motion, Decreased coordination, Decreased strength, Impaired flexibility, Impaired UE functional use  Visit Diagnosis: Muscle weakness (generalized)  Other lack of coordination  Unsteadiness on feet  Difficulty in walking, not elsewhere classified    Problem List Patient Active Problem List   Diagnosis Date Noted  . Essential hypertension, benign 09/15/2018  . Dyslipidemia 09/15/2018  . Hypogonadism in male 06/18/2018  . Testicular cancer ([place]) 06/13/2018  . Hyperlipidemia 06/13/2018  . Depression 06/13/2018  . Anxiety 06/13/2018  . Callus of foot 03/27/2018  . Pes planovalgus, acquired, right 01/30/2018  . Arthritis of midfoot 01/30/2018  . Achilles  tendon contracture, right 01/30/2018  . Left shoulder pain 07/26/2017  . Infection of prosthetic left knee joint ([place]) 07/10/2017  . 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[place]) 05/15/2017  . GERD (gastroesophageal reflux disease) 03/20/2017  . Primary osteoarthritis of one knee, right 12/30/2016  . H/O total knee replacement, left 12/30/2016  . Moderate recurrent major depression ([place]) 08/15/2016  . Chest pain due to myocardial ischemia 08/14/2016  . Chest pain, rule out acute myocardial infarction 08/14/2016  . Chest x-ray abnormality 10/17/2013  . Scoliosis 07/11/2012  . Arthritis of wrist, left, degenerative 07/11/2012  . Parkinson's disease ([place]) 01/07/2012   Terrance Usery T Tomasita Morrow, OTR/L, CLT  Bre [place] 01/05/2019, 9:08 PM  [place] MAIN [facility] SERVICES [address] [place], [place], [address] Phone: [phone]   Fax:  [phone]  Name: KSHAWN CANAL MRN: [id number] Date of Birth: Jul 06, 1951

## 2019-01-07 ENCOUNTER — Encounter: Payer: Medicare Other | Admitting: Physical Therapy

## 2019-01-09 ENCOUNTER — Encounter: Payer: Medicare Other | Admitting: Occupational Therapy

## 2019-01-09 ENCOUNTER — Encounter: Payer: Medicare Other | Admitting: Physical Therapy

## 2019-01-22 ENCOUNTER — Other Ambulatory Visit: Payer: Self-pay | Admitting: Adult Health

## 2019-05-02 ENCOUNTER — Encounter: Payer: Self-pay | Admitting: Urology

## 2019-05-02 ENCOUNTER — Other Ambulatory Visit: Payer: Self-pay

## 2019-05-02 ENCOUNTER — Ambulatory Visit (INDEPENDENT_AMBULATORY_CARE_PROVIDER_SITE_OTHER): Payer: Medicare Other | Admitting: Urology

## 2019-05-02 VITALS — BP 152/84 | HR 88 | Ht 69.0 in | Wt 179.0 lb

## 2019-05-02 DIAGNOSIS — E291 Testicular hypofunction: Secondary | ICD-10-CM | POA: Diagnosis not present

## 2019-05-02 DIAGNOSIS — N5201 Erectile dysfunction due to arterial insufficiency: Secondary | ICD-10-CM

## 2019-05-02 MED ORDER — TESTOSTERONE CYPIONATE 200 MG/ML IM SOLN
140.0000 mg | Freq: Once | INTRAMUSCULAR | Status: AC
  Administered 2019-05-02: 140 mg via INTRAMUSCULAR

## 2019-05-03 ENCOUNTER — Encounter: Payer: Self-pay | Admitting: Urology

## 2019-05-03 NOTE — Progress Notes (Signed)
05/02/2019 11:49 PM   [name] [birth year] [id number]  Referring provider: Idelle Crouch, MD [place] [facility] [place],  [place] [address]  Chief Complaint  Patient presents with  . Hypogonadism    HPI: 68 year old male presents for follow-up of hypogonadism.  Refer to my previous note of 06/13/2018.  He had someone who was giving him testosterone injections however it was sporadic and he states he never really had enough injections to tell any difference in his symptoms.  He also tried tadalafil for ED which was not effective.  He will not be able to administer his own injections and has elected to receive an office injections.   PMH: Past Medical History:  Diagnosis Date  . Anxiety   . Cancer ([place])   . Depression   . Hypertension   . Parkinson's disease [facility])     Surgical History: Past Surgical History:  Procedure Laterality Date  . BACK SURGERY    . CHOLECYSTECTOMY    . DEEP BRAIN STIMULATOR PLACEMENT     for Parkinson's disease  . JOINT REPLACEMENT     left knee x2  . ORCHIECTOMY  1983    Home Medications:  Allergies as of 05/02/2019      Reactions   Phenergan [promethazine Hcl] Other (See Comments)   Unknown    Reglan [metoclopramide] Other (See Comments)   Pass out       Medication List       Accurate as of May 02, 2019 11:59 PM. If you have any questions, ask your nurse or doctor.        STOP taking these medications   cholecalciferol 25 MCG (1000 UT) tablet Commonly known as: VITAMIN D3 Stopped by: Abbie Sons, MD   cyanocobalamin 1000 MCG tablet Stopped by: Abbie Sons, MD   hydrocortisone 2.5 % cream Stopped by: Abbie Sons, MD   methocarbamol 500 MG tablet Commonly known as: ROBAXIN Stopped by: Abbie Sons, MD   ondansetron 4 MG disintegrating tablet Commonly known as: Zofran ODT Stopped by: Abbie Sons, MD   senna-docusate 8.6-50 MG tablet Commonly known as: Senokot-S Stopped  by: Abbie Sons, MD     TAKE these medications   acetaminophen 325 MG tablet Commonly known as: TYLENOL Take 925 mg by mouth every 6 (six) hours as needed for mild pain or moderate pain.   amantadine 100 MG capsule Commonly known as: SYMMETREL Take 1 capsule (100 mg total) by mouth daily.   aspirin 325 MG tablet Take 325 mg by mouth 2 (two) times daily.   atorvastatin 20 MG tablet Commonly known as: LIPITOR Take 1 tablet (20 mg total) by mouth daily.   carbidopa-levodopa 25-100 MG tablet Commonly known as: SINEMET IR Take 1 tablet orally two times a day at 7am, and 10pm. Take 0.5 tablet orally at 10am, 1pm, 4pm, and 7pm.   ketoconazole 2 % cream Commonly known as: NIZORAL Apply 1 application topically daily. Apply to flaky areas daily as needed   losartan 100 MG tablet Commonly known as: COZAAR Take 1 tablet (100 mg total) by mouth daily.   Melatonin 3 MG Caps Take 1-2 tablets by mouth 2 hours before bedtime   meloxicam 7.5 MG tablet Commonly known as: MOBIC Take 1 tablet (7.5 mg total) by mouth daily.   metoprolol succinate 25 MG 24 hr tablet Commonly known as: TOPROL-XL Take 1 tablet (25 mg total) by mouth 2 (two) times daily.  NEEDLE (DISP) 18 G 18G X 1" Misc Commonly known as: Easy Touch FlipLock Needles 0.7 mLs by Does not apply route every 14 (fourteen) days.   NON FORMULARY Diet Type: Regular   omeprazole 20 MG tablet Commonly known as: PRILOSEC OTC Take 40 mg by mouth daily.   Potassium Citrate 15 MEQ (1620 MG) Tbcr Take 1 tablet by mouth 2 (two) times daily.   rOPINIRole 2 MG tablet Commonly known as: REQUIP Take 1 tablet (2 mg total) by mouth every 3 (three) hours. At 7am, and 1pm.   selegiline 5 MG capsule Commonly known as: ELDEPRYL Take 1 capsule (5 mg total) by mouth 2 (two) times daily. At 7am and 1pm.   SYRINGE-NEEDLE (DISP) 3 ML 21G X 1" 3 ML Misc Commonly known as: B-D 3CC LUER-LOK SYR 21GX1" 0.7 mLs by Does not apply route  every 14 (fourteen) days.   tadalafil 10 MG tablet Commonly known as: CIALIS Take 20mg  by mouth daily as needed   testosterone cypionate 200 MG/ML injection Commonly known as: DEPOTESTOSTERONE CYPIONATE Inject 0.7 mLs (140 mg total) into the muscle every 14 (fourteen) days.   traZODone 150 MG tablet Commonly known as: DESYREL Take 0.5 tablets (75 mg total) by mouth at bedtime as needed.       Allergies:  Allergies  Allergen Reactions  . Phenergan [Promethazine Hcl] Other (See Comments)    Unknown   . Reglan [Metoclopramide] Other (See Comments)    Pass out     Family History: No family history on file.  Social History:  reports that he has never smoked. He has never used smokeless tobacco. He reports current alcohol use. He reports current drug use. Drug: Marijuana.  ROS: UROLOGY Frequent Urination?: No Hard to postpone urination?: No Burning/pain with urination?: No Get up at night to urinate?: No Leakage of urine?: No Urine stream starts and stops?: No Trouble starting stream?: No Do you have to strain to urinate?: No Blood in urine?: No Urinary tract infection?: No Sexually transmitted disease?: No Injury to kidneys or bladder?: No Painful intercourse?: No Weak stream?: No Erection problems?: No Penile pain?: No  Gastrointestinal Nausea?: No Vomiting?: No Indigestion/heartburn?: No Diarrhea?: No Constipation?: No  Constitutional Fever: No Night sweats?: No Weight loss?: No Fatigue?: No  Skin Skin rash/lesions?: No Itching?: No  Eyes Blurred vision?: No Double vision?: No  Ears/Nose/Throat Sore throat?: No Sinus problems?: No  Hematologic/Lymphatic Swollen glands?: No Easy bruising?: No  Cardiovascular Leg swelling?: No Chest pain?: No  Respiratory Cough?: No Shortness of breath?: No  Endocrine Excessive thirst?: No  Musculoskeletal Back pain?: No Joint pain?: No  Neurological Headaches?: No Dizziness?: No   Psychologic Depression?: No Anxiety?: No  Physical Exam: BP (!) 152/84   Pulse 88   Ht 5\' 9"  (1.753 m)   Wt 179 lb (81.2 kg)   BMI 26.43 kg/m   Constitutional:  Alert and oriented, No acute distress. HEENT: [place] AT, moist mucus membranes.  Trachea midline, no masses. Cardiovascular: No clubbing, cyanosis, or edema. Respiratory: Normal respiratory effort, no increased work of breathing.    Assessment & Plan:   68 year old male with hypogonadism.  Will start an office injections at 140 mg every 2 weeks.  Testosterone level in approximately 6 weeks.  78-month follow-up for symptom reassessment and monitoring labs including testosterone, hematocrit and PSA.  We discussed other treatment options for his ED including intracavernosal injections and vacuum erection devices.  He wanted to see if tadalafil may be more effective  once he gets started on TRT.     Abbie Sons, [place] [address] [place], [place] [address] [phone]

## 2019-05-08 DIAGNOSIS — M19012 Primary osteoarthritis, left shoulder: Secondary | ICD-10-CM | POA: Insufficient documentation

## 2019-05-16 ENCOUNTER — Ambulatory Visit (INDEPENDENT_AMBULATORY_CARE_PROVIDER_SITE_OTHER): Payer: Medicare Other

## 2019-05-16 ENCOUNTER — Other Ambulatory Visit: Payer: Self-pay

## 2019-05-16 DIAGNOSIS — E291 Testicular hypofunction: Secondary | ICD-10-CM | POA: Diagnosis not present

## 2019-05-16 MED ORDER — TESTOSTERONE CYPIONATE 200 MG/ML IM SOLN
200.0000 mg | Freq: Once | INTRAMUSCULAR | Status: AC
  Administered 2019-05-16: 200 mg via INTRAMUSCULAR

## 2019-05-16 NOTE — Progress Notes (Signed)
Testosterone IM Injection  Due to Hypogonadism patient is present today for a Testosterone Injection.  Medication: Testosterone Cypionate Dose: 0.15mL Location: right upper outer buttocks Lot: [id number] Exp:12/2019  Patient tolerated well, no complications were noted  Preformed by: Gordy Clement, CMA ([place])  Follow up: As scheduled

## 2019-05-30 ENCOUNTER — Other Ambulatory Visit: Payer: Self-pay

## 2019-05-30 ENCOUNTER — Ambulatory Visit (INDEPENDENT_AMBULATORY_CARE_PROVIDER_SITE_OTHER): Payer: Medicare Other | Admitting: Family Medicine

## 2019-05-30 DIAGNOSIS — E291 Testicular hypofunction: Secondary | ICD-10-CM | POA: Diagnosis not present

## 2019-05-30 MED ORDER — TESTOSTERONE CYPIONATE 200 MG/ML IM SOLN
200.0000 mg | Freq: Once | INTRAMUSCULAR | Status: AC
  Administered 2019-05-30: 200 mg via INTRAMUSCULAR

## 2019-05-30 NOTE — Progress Notes (Signed)
Testosterone IM Injection  Due to Hypogonadism patient is present today for a Testosterone Injection.  Medication: Testosterone Cypionate Dose: 0.66ml Location: left upper outer buttocks Lot: [id number] Exp:12/2019  Patient tolerated well, no complications were noted  Preformed by: Elberta Leatherwood, CMA  Follow up: 2 weeks

## 2019-06-11 ENCOUNTER — Other Ambulatory Visit: Payer: Self-pay | Admitting: Student

## 2019-06-11 DIAGNOSIS — M7581 Other shoulder lesions, right shoulder: Secondary | ICD-10-CM

## 2019-06-11 DIAGNOSIS — M19011 Primary osteoarthritis, right shoulder: Secondary | ICD-10-CM

## 2019-06-12 ENCOUNTER — Other Ambulatory Visit: Payer: Medicare Other

## 2019-06-13 ENCOUNTER — Other Ambulatory Visit: Payer: Self-pay

## 2019-06-13 ENCOUNTER — Ambulatory Visit (INDEPENDENT_AMBULATORY_CARE_PROVIDER_SITE_OTHER): Payer: Medicare Other

## 2019-06-13 DIAGNOSIS — E291 Testicular hypofunction: Secondary | ICD-10-CM | POA: Diagnosis not present

## 2019-06-13 MED ORDER — TESTOSTERONE CYPIONATE 200 MG/ML IM SOLN
200.0000 mg | Freq: Once | INTRAMUSCULAR | Status: AC
  Administered 2019-06-13: 200 mg via INTRAMUSCULAR

## 2019-06-13 NOTE — Progress Notes (Signed)
Testosterone IM Injection  Due to Hypogonadism patient is present today for a Testosterone Injection.  Medication: Testosterone Cypionate Dose: 0.42ml Location: right upper outer buttocks Lot: [id number] Exp:02/21  Patient tolerated well, no complications were noted  Preformed by: Shawnie Dapper, CMA  Follow up: 2 weeks

## 2019-06-14 ENCOUNTER — Telehealth: Payer: Self-pay

## 2019-06-14 ENCOUNTER — Other Ambulatory Visit: Payer: Self-pay | Admitting: Urology

## 2019-06-14 LAB — TESTOSTERONE: Testosterone: 101 ng/dL — ABNORMAL LOW (ref 264–916)

## 2019-06-14 MED ORDER — TESTOSTERONE CYPIONATE 200 MG/ML IM SOLN: 200.0000 mg | INTRAMUSCULAR | 0 refills | Status: DC

## 2019-06-14 NOTE — Telephone Encounter (Signed)
mychart notification sent 

## 2019-06-14 NOTE — Telephone Encounter (Signed)
-----   Message from Abbie Sons, MD sent at 06/14/2019 12:36 PM EDT ----- Trough testosterone level was low at 101.  Will increase dose to 200 mg (1 cc) every 2 weeks.  He has a follow-up scheduled 8/21.

## 2019-06-19 ENCOUNTER — Ambulatory Visit
Admission: RE | Admit: 2019-06-19 | Discharge: 2019-06-19 | Disposition: A | Discharge status: Home / Self Care | Payer: Medicare Other | Source: Ambulatory Visit | Attending: Student | Admitting: Student

## 2019-06-19 ENCOUNTER — Other Ambulatory Visit: Payer: Self-pay

## 2019-06-19 DIAGNOSIS — M65819 Other synovitis and tenosynovitis, unspecified shoulder: Secondary | ICD-10-CM | POA: Insufficient documentation

## 2019-06-19 DIAGNOSIS — M7581 Other shoulder lesions, right shoulder: Secondary | ICD-10-CM

## 2019-06-19 DIAGNOSIS — M25511 Pain in right shoulder: Secondary | ICD-10-CM | POA: Diagnosis not present

## 2019-06-19 DIAGNOSIS — M19011 Primary osteoarthritis, right shoulder: Secondary | ICD-10-CM | POA: Diagnosis not present

## 2019-06-19 DIAGNOSIS — M75121 Complete rotator cuff tear or rupture of right shoulder, not specified as traumatic: Secondary | ICD-10-CM | POA: Insufficient documentation

## 2019-06-19 MED ORDER — SODIUM CHLORIDE (PF) 0.9 % IJ SOLN
10.0000 mL | INTRAMUSCULAR | Status: DC | PRN
  Administered 2019-06-19: 5 mL via INTRAVENOUS

## 2019-06-19 MED ORDER — IOHEXOL 180 MG/ML  SOLN
20.0000 mL | Freq: Once | INTRAMUSCULAR | Status: AC | PRN
  Administered 2019-06-19: 15 mL via INTRAVENOUS

## 2019-06-19 MED ORDER — LIDOCAINE HCL (PF) 1 % IJ SOLN
5.0000 mL | Freq: Once | INTRAMUSCULAR | Status: AC
  Administered 2019-06-19: 5 mL
  Filled 2019-06-19: qty 5

## 2019-06-20 ENCOUNTER — Other Ambulatory Visit: Payer: Medicare Other

## 2019-06-25 ENCOUNTER — Other Ambulatory Visit: Payer: Self-pay | Admitting: Family Medicine

## 2019-06-25 MED ORDER — TESTOSTERONE CYPIONATE 200 MG/ML IM SOLN: 200.0000 mg | INTRAMUSCULAR | 0 refills | Status: DC

## 2019-06-28 ENCOUNTER — Other Ambulatory Visit: Payer: Self-pay

## 2019-06-28 ENCOUNTER — Ambulatory Visit (INDEPENDENT_AMBULATORY_CARE_PROVIDER_SITE_OTHER): Payer: Medicare Other | Admitting: *Deleted

## 2019-06-28 DIAGNOSIS — E291 Testicular hypofunction: Secondary | ICD-10-CM | POA: Diagnosis not present

## 2019-06-28 MED ORDER — TESTOSTERONE CYPIONATE 200 MG/ML IM SOLN
200.0000 mg | Freq: Once | INTRAMUSCULAR | Status: AC
  Administered 2019-06-28: 200 mg via INTRAMUSCULAR

## 2019-06-28 NOTE — Progress Notes (Signed)
Testosterone IM Injection  Due to Hypogonadism patient is present today for a Testosterone Injection.  Medication: Testosterone Cypionate Dose: 36ml Location: right upper outer buttocks Lot: [id number] Exp:02/21  Patient tolerated well, no complications were noted  Preformed by: Verlene Mayer, CMA   Follow up: Two weeks

## 2019-07-10 ENCOUNTER — Other Ambulatory Visit: Payer: Self-pay | Admitting: Family Medicine

## 2019-07-10 DIAGNOSIS — N5201 Erectile dysfunction due to arterial insufficiency: Secondary | ICD-10-CM

## 2019-07-10 MED ORDER — TADALAFIL 10 MG PO TABS: ORAL_TABLET | ORAL | 6 refills | Status: DC

## 2019-07-12 ENCOUNTER — Other Ambulatory Visit: Payer: Self-pay

## 2019-07-12 ENCOUNTER — Ambulatory Visit (INDEPENDENT_AMBULATORY_CARE_PROVIDER_SITE_OTHER): Payer: Medicare Other | Admitting: *Deleted

## 2019-07-12 DIAGNOSIS — E291 Testicular hypofunction: Secondary | ICD-10-CM

## 2019-07-12 MED ORDER — TESTOSTERONE CYPIONATE 200 MG/ML IM SOLN
200.0000 mg | Freq: Once | INTRAMUSCULAR | Status: AC
  Administered 2019-07-12: 200 mg via INTRAMUSCULAR

## 2019-07-12 NOTE — Progress Notes (Signed)
Testosterone IM Injection  Due to Hypogonadism patient is present today for a Testosterone Injection.  Medication: Testosterone Cypionate Dose: 200mg  Location: left upper outer buttocks Lot: [id number]  Exp:02/21  Patient tolerated well, no complications were noted  Performed by: Thayer Inabinet,CMA  Follow up: as scheduled

## 2019-07-25 ENCOUNTER — Ambulatory Visit (INDEPENDENT_AMBULATORY_CARE_PROVIDER_SITE_OTHER): Payer: Medicare Other | Admitting: *Deleted

## 2019-07-25 ENCOUNTER — Other Ambulatory Visit: Payer: Self-pay

## 2019-07-25 DIAGNOSIS — E291 Testicular hypofunction: Secondary | ICD-10-CM | POA: Diagnosis not present

## 2019-07-25 MED ORDER — TESTOSTERONE CYPIONATE 200 MG/ML IM SOLN
200.0000 mg | Freq: Once | INTRAMUSCULAR | Status: AC
  Administered 2019-07-25: 200 mg via INTRAMUSCULAR

## 2019-07-25 NOTE — Progress Notes (Signed)
Testosterone IM Injection  Due to Hypogonadism patient is present today for a Testosterone Injection.  Medication: Testosterone Cypionate Dose:41ml Location: right upper outer buttocks Lot: [id number] Exp:09/2020  Patient tolerated well, no complications were noted  Performed by: Rebeca Morris,CMA  Follow up: Two weeks

## 2019-08-02 ENCOUNTER — Ambulatory Visit: Payer: Medicare Other | Admitting: Urology

## 2019-08-08 ENCOUNTER — Ambulatory Visit (INDEPENDENT_AMBULATORY_CARE_PROVIDER_SITE_OTHER): Payer: Medicare Other | Admitting: Urology

## 2019-08-08 ENCOUNTER — Encounter: Payer: Self-pay | Admitting: Urology

## 2019-08-08 ENCOUNTER — Other Ambulatory Visit: Payer: Self-pay

## 2019-08-08 VITALS — BP 124/59 | HR 81 | Ht 66.5 in | Wt 172.0 lb

## 2019-08-08 DIAGNOSIS — N401 Enlarged prostate with lower urinary tract symptoms: Secondary | ICD-10-CM | POA: Diagnosis not present

## 2019-08-08 DIAGNOSIS — E291 Testicular hypofunction: Secondary | ICD-10-CM | POA: Diagnosis not present

## 2019-08-08 MED ORDER — TESTOSTERONE CYPIONATE 200 MG/ML IM SOLN
200.0000 mg | Freq: Once | INTRAMUSCULAR | Status: AC
  Administered 2019-08-08: 200 mg via INTRAMUSCULAR

## 2019-08-08 NOTE — Progress Notes (Signed)
08/08/2019 3:52 PM   Charles Choi [birth year] [id number]  Referring provider: Idelle Crouch, MD [facility] [place],  [place] [address]  Chief Complaint  Patient presents with  . Follow-up  . Hypogonadism    HPI: 68 y.o. male presents for follow-up of hypogonadism.  He was initially seen August 2019 with symptoms of tiredness, fatigue and decreased libido.  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  At his follow-up in June 2020 he was only using testosterone sporadically.  Since that visit he has been coming to the office every 2 weeks for injections.  He does note increased energy and increased libido.  He does have a ED and uses tadalafil.  Denies bothersome lower urinary tract symptoms.   PMH: Past Medical History:  Diagnosis Date  . Anxiety   . Cancer ([place])   . Depression   . Hypertension   . Parkinson's disease [facility])     Surgical History: Past Surgical History:  Procedure Laterality Date  . BACK SURGERY    . CHOLECYSTECTOMY    . DEEP BRAIN STIMULATOR PLACEMENT     for Parkinson's disease  . JOINT REPLACEMENT     left knee x2  . ORCHIECTOMY  1983    Home Medications:  Allergies as of 08/08/2019      Reactions   Phenergan [promethazine Hcl] Other (See Comments)   Unknown    Reglan [metoclopramide] Other (See Comments)   Pass out       Medication List       Accurate as of August 08, 2019  3:52 PM. If you have any questions, ask your nurse or doctor.        STOP taking these medications   NEEDLE (DISP) 18 G 18G X 1" Misc Commonly known as: Easy Touch FlipLock Needles Stopped by: Abbie Sons, MD   SYRINGE-NEEDLE (DISP) 3 ML 21G X 1" 3 ML Misc Commonly known as: B-D 3CC LUER-LOK SYR 21GX1" Stopped by: Abbie Sons, MD     TAKE these medications   acetaminophen 325 MG tablet Commonly known as: TYLENOL Take 925 mg by mouth every 6 (six) hours as needed for mild pain or moderate pain.   amantadine 100 MG  capsule Commonly known as: SYMMETREL Take 1 capsule (100 mg total) by mouth daily.   aspirin 325 MG tablet Take 325 mg by mouth 2 (two) times daily.   atorvastatin 20 MG tablet Commonly known as: LIPITOR Take 1 tablet (20 mg total) by mouth daily.   carbidopa-levodopa 25-100 MG tablet Commonly known as: SINEMET IR Take 1 tablet orally two times a day at 7am, and 10pm. Take 0.5 tablet orally at 10am, 1pm, 4pm, and 7pm.   ketoconazole 2 % cream Commonly known as: NIZORAL Apply 1 application topically daily. Apply to flaky areas daily as needed   losartan 100 MG tablet Commonly known as: COZAAR Take 1 tablet (100 mg total) by mouth daily.   Melatonin 3 MG Caps Take 1-2 tablets by mouth 2 hours before bedtime   meloxicam 7.5 MG tablet Commonly known as: MOBIC Take 1 tablet (7.5 mg total) by mouth daily.   metoprolol succinate 25 MG 24 hr tablet Commonly known as: TOPROL-XL Take 1 tablet (25 mg total) by mouth 2 (two) times daily.   metoprolol tartrate 25 MG tablet Commonly known as: LOPRESSOR TAKE 1 TABLET TWICE DAILY   NON FORMULARY Diet Type: Regular   omeprazole 20 MG tablet Commonly known as: PRILOSEC  OTC Take 40 mg by mouth daily.   Potassium Citrate 15 MEQ (1620 MG) Tbcr Take 1 tablet by mouth 2 (two) times daily.   ranolazine 500 MG 12 hr tablet Commonly known as: RANEXA   rOPINIRole 2 MG tablet Commonly known as: REQUIP Take 1 tablet (2 mg total) by mouth every 3 (three) hours. At 7am, and 1pm.   selegiline 5 MG capsule Commonly known as: ELDEPRYL Take 1 capsule (5 mg total) by mouth 2 (two) times daily. At 7am and 1pm.   tadalafil 10 MG tablet Commonly known as: CIALIS Take 20mg  by mouth daily as needed   testosterone cypionate 200 MG/ML injection Commonly known as: DEPOTESTOSTERONE CYPIONATE Inject 1 mL (200 mg total) into the muscle every 14 (fourteen) days.   traZODone 150 MG tablet Commonly known as: DESYREL Take 0.5 tablets (75 mg total)  by mouth at bedtime as needed.       Allergies:  Allergies  Allergen Reactions  . Phenergan [Promethazine Hcl] Other (See Comments)    Unknown   . Reglan [Metoclopramide] Other (See Comments)    Pass out     Family History: No family history on file.  Social History:  reports that he has never smoked. He has never used smokeless tobacco. He reports current alcohol use. He reports current drug use. Drug: Marijuana.  ROS: UROLOGY Frequent Urination?: No Hard to postpone urination?: No Burning/pain with urination?: No Get up at night to urinate?: No Leakage of urine?: No Urine stream starts and stops?: No Trouble starting stream?: No Do you have to strain to urinate?: No Blood in urine?: No Urinary tract infection?: No Sexually transmitted disease?: No Injury to kidneys or bladder?: No Painful intercourse?: No Weak stream?: No Erection problems?: No Penile pain?: No  Gastrointestinal Nausea?: No Vomiting?: No Indigestion/heartburn?: No Diarrhea?: No Constipation?: No  Constitutional Fever: No Night sweats?: No Weight loss?: No Fatigue?: No  Skin Skin rash/lesions?: No Itching?: No  Eyes Blurred vision?: No Double vision?: No  Ears/Nose/Throat Sore throat?: No Sinus problems?: No  Hematologic/Lymphatic Swollen glands?: No Easy bruising?: No  Cardiovascular Leg swelling?: No Chest pain?: No  Respiratory Cough?: No Shortness of breath?: No  Endocrine Excessive thirst?: No  Musculoskeletal Back pain?: No Joint pain?: No  Neurological Headaches?: No Dizziness?: No  Psychologic Depression?: Yes Anxiety?: No  Physical Exam: BP (!) 124/59 (BP Location: Left Arm, Patient Position: Sitting, Cuff Size: Normal)   Pulse 81   Ht 5' 6.5" (1.689 m)   Wt 172 lb (78 kg)   BMI 27.35 kg/m   Constitutional:  Alert and oriented, No acute distress. HEENT: [place] AT, moist mucus membranes.  Trachea midline, no masses. Cardiovascular: No clubbing,  cyanosis, or edema. Respiratory: Normal respiratory effort, no increased work of breathing. Skin: No rashes, bruises or suspicious lesions. Psychiatric: Normal mood and affect.   Assessment & Plan:    - Hypogonadism in male Trough testosterone level along with hematocrit and PSA were drawn today.  If stable follow-up 6 months.  Return in about 6 months (around 02/06/2020) for Recheck.   Abbie Sons, [place] [address], [place], [place] [address] [phone]

## 2019-08-08 NOTE — Progress Notes (Signed)
Testosterone IM Injection  Due to Hypogonadism patient is present today for a Testosterone Injection.  Medication: Testosterone Cypionate Dose: 20mL Location: right upper outer buttocks Lot: [id number] Exp:09/2020  Patient tolerated well, no complications were noted  Preformed by: Gordy Clement, CMA  Follow up: RTC in 2 weeks

## 2019-08-09 ENCOUNTER — Telehealth: Payer: Self-pay

## 2019-08-09 ENCOUNTER — Encounter: Payer: Self-pay | Admitting: Urology

## 2019-08-09 DIAGNOSIS — R972 Elevated prostate specific antigen [PSA]: Secondary | ICD-10-CM

## 2019-08-09 LAB — PSA: Prostate Specific Ag, Serum: 3.1 ng/mL (ref 0.0–4.0)

## 2019-08-09 LAB — HEMATOCRIT: Hematocrit: 42.3 % (ref 37.5–51.0)

## 2019-08-09 LAB — TESTOSTERONE: Testosterone: 320 ng/dL (ref 264–916)

## 2019-08-09 NOTE — Telephone Encounter (Signed)
-----   Message from Abbie Sons, MD sent at 08/09/2019  8:00 AM EDT ----- Testosterone level was 320 which is acceptable as a trough level.  Hematocrit was normal.  PSA was one-point higher than last year at 3.1 and would recommend repeating the PSA only in 3 months.

## 2019-08-09 NOTE — Telephone Encounter (Signed)
Called pt informed him of the information below. Pt gave verbal understanding. Lab ordered, appt scheduled.

## 2019-08-22 ENCOUNTER — Other Ambulatory Visit: Payer: Self-pay

## 2019-08-22 ENCOUNTER — Ambulatory Visit (INDEPENDENT_AMBULATORY_CARE_PROVIDER_SITE_OTHER): Payer: Medicare Other | Admitting: *Deleted

## 2019-08-22 DIAGNOSIS — E291 Testicular hypofunction: Secondary | ICD-10-CM

## 2019-08-22 MED ORDER — TESTOSTERONE CYPIONATE 200 MG/ML IM SOLN
200.0000 mg | Freq: Once | INTRAMUSCULAR | Status: AC
  Administered 2019-08-22: 200 mg via INTRAMUSCULAR

## 2019-08-22 NOTE — Progress Notes (Signed)
Testosterone IM Injection  Due to Hypogonadism patient is present today for a Testosterone Injection.  Medication: Testosterone Cypionate Dose: 73ml Location: left upper outer buttocks Lot: [id number] Exp:09/2020  Patient tolerated well, no complications were noted  Performed by: Verlene Mayer, CMA  Follow up: Two weeks

## 2019-08-28 DIAGNOSIS — M12811 Other specific arthropathies, not elsewhere classified, right shoulder: Secondary | ICD-10-CM | POA: Insufficient documentation

## 2019-09-05 ENCOUNTER — Ambulatory Visit: Payer: Medicare Other

## 2019-11-12 ENCOUNTER — Other Ambulatory Visit: Payer: Self-pay

## 2019-11-12 ENCOUNTER — Other Ambulatory Visit: Payer: Medicare Other

## 2019-11-12 DIAGNOSIS — R972 Elevated prostate specific antigen [PSA]: Secondary | ICD-10-CM

## 2019-11-13 LAB — PSA: Prostate Specific Ag, Serum: 2.2 ng/mL (ref 0.0–4.0)

## 2019-11-15 ENCOUNTER — Telehealth: Payer: Self-pay

## 2019-11-15 NOTE — Telephone Encounter (Signed)
-----   Message from Abbie Sons, MD sent at 11/15/2019  2:39 PM EST ----- Repeat PSA back to baseline at 2.2

## 2019-12-04 DIAGNOSIS — Z96619 Presence of unspecified artificial shoulder joint: Secondary | ICD-10-CM | POA: Insufficient documentation

## 2019-12-06 ENCOUNTER — Other Ambulatory Visit: Payer: Self-pay | Admitting: Urology

## 2019-12-25 DIAGNOSIS — Z96611 Presence of right artificial shoulder joint: Secondary | ICD-10-CM | POA: Insufficient documentation

## 2019-12-28 ENCOUNTER — Ambulatory Visit: Payer: Medicare Other

## 2019-12-29 ENCOUNTER — Ambulatory Visit: Payer: Medicare Other | Attending: Internal Medicine

## 2019-12-29 DIAGNOSIS — Z23 Encounter for immunization: Secondary | ICD-10-CM | POA: Insufficient documentation

## 2019-12-29 NOTE — Progress Notes (Signed)
   Covid-19 Vaccination Clinic  Name:  Charles Choi    MRN: [id number] DOB: 01-24-1951  12/29/2019  [name] was observed post Covid-19 immunization for 15 minutes without incidence. He was provided with Vaccine Information Sheet and instruction to access the V-Safe system.   [name] was instructed to call 911 with any severe reactions post vaccine: Marland Kitchen Difficulty breathing  . Swelling of your face and throat  . A fast heartbeat  . A bad rash all over your body  . Dizziness and weakness    Immunizations Administered    Name Date Dose VIS Date Route   Pfizer COVID-19 Vaccine 12/29/2019 11:11 AM 0.3 mL 10/18/2019 Intramuscular   Manufacturer: [place]   Lot: [id number]   [place]: [id number]

## 2020-01-02 ENCOUNTER — Other Ambulatory Visit: Payer: Self-pay

## 2020-01-02 ENCOUNTER — Ambulatory Visit (INDEPENDENT_AMBULATORY_CARE_PROVIDER_SITE_OTHER): Payer: Medicare Other

## 2020-01-02 DIAGNOSIS — E291 Testicular hypofunction: Secondary | ICD-10-CM | POA: Diagnosis not present

## 2020-01-02 MED ORDER — TESTOSTERONE CYPIONATE 200 MG/ML IM SOLN
200.0000 mg | Freq: Once | INTRAMUSCULAR | Status: AC
  Administered 2020-01-02: 200 mg via INTRAMUSCULAR

## 2020-01-02 NOTE — Progress Notes (Signed)
Testosterone IM Injection  Due to Hypogonadism patient is present today for a Testosterone Injection.  Medication: Testosterone Cypionate Dose: 1 ML Location: right upper outer buttocks Lot: [id number] Exp:01/04/2021  Patient tolerated well, no complications were noted  Preformed by: Kerman Passey, CMA.  Follow up: 2 weeks **

## 2020-01-16 ENCOUNTER — Ambulatory Visit (INDEPENDENT_AMBULATORY_CARE_PROVIDER_SITE_OTHER): Payer: Medicare Other

## 2020-01-16 ENCOUNTER — Other Ambulatory Visit: Payer: Self-pay

## 2020-01-16 DIAGNOSIS — E291 Testicular hypofunction: Secondary | ICD-10-CM | POA: Diagnosis not present

## 2020-01-16 MED ORDER — TESTOSTERONE CYPIONATE 200 MG/ML IM SOLN
200.0000 mg | Freq: Once | INTRAMUSCULAR | Status: AC
  Administered 2020-01-16: 200 mg via INTRAMUSCULAR

## 2020-01-16 NOTE — Patient Instructions (Signed)
Testosterone Replacement Therapy  Testosterone replacement therapy (TRT) is used to treat men who have a low testosterone level (hypogonadism). Testosterone is a male hormone that is produced in the testicles. It is responsible for typically male characteristics and for maintaining a man's sex drive and the ability to get an erection. Testosterone also supports bone and muscle health. TRT can be a gel, liquid, or patch that you put on your skin. It can also be in the form of a tablet or an injection. In some cases, your health care provider may insert long-acting pellets under your skin. In most men, the level of testosterone starts to decline gradually after age 58. Low testosterone can also be caused by certain medical conditions, medicines, and obesity. Your health care provider can diagnose hypogonadism with at least two blood tests that are done early in the morning. Low testosterone may not need to be treated. TRT is usually a choice that you make with your health care provider. Your health care provider may recommend TRT if you have low testosterone that is causing symptoms, such as:  Low sex drive.  Erection problems.  Breast enlargement.  Loss of body hair.  Weak muscles or bones.  Shrinking testicles.  Increased body fat.  Low energy.  Hot flashes.  Depression.  Decreased work Systems analyst. TRT is a lifetime treatment. If you stop treatment, your testosterone will drop, and your symptoms may return. What are the risks? Testosterone replacement therapy may have side effects, including:  Lower sperm count.  Skin irritation at the application or injection site.  Mouth irritation if you take an oral tablet.  Acne.  Swelling of your legs or feet.  Tender breasts.  Dizziness.  Sleep disturbance.  Mood swings.  Possible increased risk of stroke or heart attack. Testosterone replacement therapy may also increase your risk for prostate cancer or male breast cancer.  You should not use TRT if you have either of those conditions. Your health care provider also may not recommend TRT if:  You are suspected of having prostate cancer.  You want to father a child.  You have a high number of red blood cells.  You have untreated sleep apnea.  You have a very large prostate. Supplies needed:  Your health care provider will prescribe the testosterone gel, solution, or medicine that you need. If your health care provider teaches you to do self-injections at home, you will also need: ? Your medicine vial. ? Disposable needles and syringes. ? Alcohol swabs. ? A needle disposal container. ? Adhesive bandages. How to use testosterone replacement therapy Your health care provider will help you find the TRT option that will work best for you based on your preference, the side effects, and the cost. You may:  Rub testosterone gel on your upper arm or shoulder every day after a shower. This is the most common type of TRT. Do not let women or children come in contact with the gel.  Apply a testosterone solution under your arms once each day.  Place a testosterone patch on your skin once each day.  Dissolve a testosterone tablet in your mouth twice each day.  Have a testosterone pellet inserted under your skin by your health care provider. This will be replaced every 3-6 months.  Use testosterone nasal spray three times each day.  Get testosterone injections. For some types of testosterone, your health care provider will give you this injection. With other types of testosterone, you may be taught to give injections to  yourself. The frequency of injections may vary based on the type of testosterone that you receive. Follow these instructions at home:  Take over-the-counter and prescription medicines only as told by your health care provider.  Lose weight if you are overweight. Ask your health care provider to help you start a healthy diet and exercise program to  reach and maintain a healthy weight.  Work with your health care provider to treat other medical conditions that may lower your testosterone. These include obesity, high blood pressure, high cholesterol, diabetes, liver disease, kidney disease, and sleep apnea.  Keep all follow-up visits as told by your health care provider. This is important. General recommendations  Discuss all risks and benefits with your health care provider before starting therapy.  Work with your health care provider to check your prostate health and do blood testing before you start therapy.  Do not use any testosterone replacement therapies that are not prescribed by your health care provider or not approved for use in the [place]  Do not use TRT for bodybuilding or to improve sexual performance. TRT should be used only to treat symptoms of low testosterone.  Return for all repeat prostate checks and blood tests during therapy, as told by your health care provider. Where to find more information Learn more about testosterone replacement therapy from:  American Urological Foundation: www.urologyhealth.org/urologic-conditions/low-testosterone-(hypogonadism)  Endocrine Society: www.hormone.org/diseases-and-conditions/mens-health/hypogonadism Contact a health care provider if:  You have side effects from your testosterone replacement therapy.  You continue to have symptoms of low testosterone during treatment.  You develop new symptoms during treatment. Summary  Testosterone replacement therapy is only for men who have low testosterone as determined by blood testing and who have symptoms of low testosterone.  Testosterone replacement therapy should be prescribed only by a health care provider and should be used under the supervision of a health care provider.  You may not be able to take testosterone if you have certain medical conditions, including prostate cancer, male breast cancer, or heart  disease.  Testosterone replacement therapy may have side effects and may make some medical conditions worse.  Talk with your health care provider about all the risks and benefits before you start therapy. This information is not intended to replace advice given to you by your health care provider. Make sure you discuss any questions you have with your health care provider. Document Revised: 11/06/2016 Document Reviewed: 07/14/2016 Elsevier Patient Education  2020 Elsevier Inc.  

## 2020-01-16 NOTE — Progress Notes (Signed)
Testosterone IM Injection  Due to Hypogonadism patient is present today for a Testosterone Injection.  Medication: Testosterone Cypionate Dose: 40mL Location: left upper outer buttocks Lot: [id number] Exp:12/2020  Patient tolerated well, no complications were noted  Preformed by: Gordy Clement, CMA  Follow up: RTC in 2 weeks

## 2020-01-21 ENCOUNTER — Ambulatory Visit: Payer: Medicare Other | Attending: Internal Medicine

## 2020-01-21 DIAGNOSIS — Z23 Encounter for immunization: Secondary | ICD-10-CM

## 2020-01-21 NOTE — Progress Notes (Signed)
   Covid-19 Vaccination Clinic  Name:  STOCKTON PULLIAN    MRN: [id number] DOB: Mar 22, 1951  01/21/2020  Mr. Siegler was observed post Covid-19 immunization for 15 minutes without incident. He was provided with Vaccine Information Sheet and instruction to access the V-Safe system.   Mr. Bussell was instructed to call 911 with any severe reactions post vaccine: Marland Kitchen Difficulty breathing  . Swelling of face and throat  . A fast heartbeat  . A bad rash all over body  . Dizziness and weakness   Immunizations Administered    Name Date Dose VIS Date Route   Pfizer COVID-19 Vaccine 01/21/2020 10:51 AM 0.3 mL 10/18/2019 Intramuscular   Manufacturer: [place]   Lot: [id number]   [place]: [id number]

## 2020-01-30 ENCOUNTER — Other Ambulatory Visit: Payer: Self-pay

## 2020-01-30 ENCOUNTER — Ambulatory Visit (INDEPENDENT_AMBULATORY_CARE_PROVIDER_SITE_OTHER): Payer: Medicare Other | Admitting: *Deleted

## 2020-01-30 DIAGNOSIS — E291 Testicular hypofunction: Secondary | ICD-10-CM

## 2020-01-30 MED ORDER — TESTOSTERONE CYPIONATE 200 MG/ML IM SOLN
200.0000 mg | Freq: Once | INTRAMUSCULAR | Status: AC
  Administered 2020-01-30: 200 mg via INTRAMUSCULAR

## 2020-01-30 NOTE — Progress Notes (Signed)
Testosterone IM Injection  Due to Hypogonadism patient is present today for a Testosterone Injection.  Medication: Testosterone Cypionate Dose: 59ml Location: right upper outer buttocks Lot: [id number] Exp:12/2020  Patient tolerated well, no complications were noted  Performed by: Verlene Mayer, CMA  Follow up: 2 weeks-scheduled appointment

## 2020-02-05 NOTE — Progress Notes (Incomplete)
02/06/20 1:11 PM   Charles Choi Jan 25, 1951 [id number]  Referring provider: Idelle Crouch, MD [place] [facility] [place],  [place] [address]  No chief complaint on file.   HPI: 69 y.o. male who returns today for a 6 month f/u for evaluation and management of hypogonadism.   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 Hypogonadism   *** 2. *** *** 3. *** ***   PMH: Past Medical History:  Diagnosis Date  . Anxiety   . Cancer ([place])   . Depression   . Hypertension   . Parkinson's disease [facility])     Surgical History: Past Surgical History:  Procedure Laterality Date  . BACK SURGERY    . CHOLECYSTECTOMY    . DEEP BRAIN STIMULATOR PLACEMENT     for Parkinson's disease  . JOINT REPLACEMENT     left knee x2  . ORCHIECTOMY  1983    Home Medications:  Allergies as of 02/06/2020      Reactions   Phenergan [promethazine Hcl] Other (See Comments)   Unknown    Reglan [metoclopramide] Other (See Comments)   Pass out       Medication List       Accurate as of February 06, 2020  1:11 PM. If you have any questions, ask your nurse or doctor.        acetaminophen 325 MG tablet Commonly known as: TYLENOL Take 925 mg by mouth every 6 (six) hours as needed for mild pain or moderate pain.   amantadine 100 MG capsule Commonly known as: SYMMETREL Take 1 capsule (100 mg total) by mouth daily.   aspirin 325 MG tablet Take 325 mg by mouth 2 (two) times daily.   atorvastatin 20 MG tablet Commonly known as: LIPITOR Take 1 tablet (20 mg total) by mouth daily.   carbidopa-levodopa 25-100 MG tablet Commonly known as: SINEMET IR Take 1 tablet orally two times a day at 7am, and 10pm. Take 0.5 tablet orally at 10am, 1pm, 4pm, and 7pm.   ketoconazole 2 % cream Commonly known  as: NIZORAL Apply 1 application topically daily. Apply to flaky areas daily as needed   losartan 100 MG tablet Commonly known as: COZAAR Take 1 tablet (100 mg total) by mouth daily.   Melatonin 3 MG Caps Take 1-2 tablets by mouth 2 hours before bedtime   meloxicam 7.5 MG tablet Commonly known as: MOBIC Take 1 tablet (7.5 mg total) by mouth daily.   metoprolol succinate 25 MG 24 hr tablet Commonly known as: TOPROL-XL Take 1 tablet (25 mg total) by mouth 2 (two) times daily.   metoprolol tartrate 25 MG tablet Commonly known as: LOPRESSOR TAKE 1 TABLET TWICE DAILY   NON FORMULARY Diet Type: Regular   omeprazole 20 MG tablet Commonly known as: PRILOSEC OTC Take 40 mg by mouth daily.   Potassium Citrate 15 MEQ (1620 MG) Tbcr Take 1 tablet by mouth 2 (two) times daily.   ranolazine 500 MG 12 hr tablet Commonly known as: RANEXA   rOPINIRole 2 MG tablet Commonly known as: REQUIP Take 1 tablet (2 mg total) by mouth every 3 (three) hours. At 7am, and 1pm.   selegiline 5 MG capsule Commonly known as: ELDEPRYL Take 1 capsule (5 mg total) by mouth 2 (two) times daily. At  7am and 1pm.   tadalafil 10 MG tablet Commonly known as: CIALIS Take 20mg  by mouth daily as needed   testosterone cypionate 200 MG/ML injection Commonly known as: DEPOTESTOSTERONE CYPIONATE INJECT 1ML INTO THE MUSCLE EVERY 14 DAYSAS DIRECTED   traZODone 150 MG tablet Commonly known as: DESYREL Take 0.5 tablets (75 mg total) by mouth at bedtime as needed.       Allergies:  Allergies  Allergen Reactions  . Phenergan [Promethazine Hcl] Other (See Comments)    Unknown   . Reglan [Metoclopramide] Other (See Comments)    Pass out     Family History: No family history on file.  Social History:  reports that he has never smoked. He has never used smokeless tobacco. He reports current alcohol use. He reports current drug use. Drug: Marijuana.   Physical Exam: There were no vitals taken for this  visit.  Constitutional:  Alert and oriented, No acute distress. HEENT: [place] AT, moist mucus membranes.  Trachea midline, no masses. Cardiovascular: No clubbing, cyanosis, or edema. Respiratory: Normal respiratory effort, no increased work of breathing. GI: Abdomen is soft, nontender, nondistended, no abdominal masses GU: No CVA tenderness Lymph: No cervical or inguinal lymphadenopathy. Skin: No rashes, bruises or suspicious lesions. Neurologic: Grossly intact, no focal deficits, moving all 4 extremities. Psychiatric: Normal mood and affect.  Laboratory Data:  Urinalysis   Pertinent Imaging: ***   Assessment & Plan:    @DIAGMED @  No follow-ups on file.  [facility] [address], [place], [place] [address] [phone]  I, Lucas Mallow, am acting as a scribe for Dr. Nicki Reaper C. Stoioff,  {Add Media planner

## 2020-02-06 ENCOUNTER — Ambulatory Visit: Payer: Medicare Other | Admitting: Urology

## 2020-02-06 ENCOUNTER — Encounter: Payer: Self-pay | Admitting: Urology

## 2020-02-13 ENCOUNTER — Ambulatory Visit (INDEPENDENT_AMBULATORY_CARE_PROVIDER_SITE_OTHER): Payer: Medicare Other | Admitting: *Deleted

## 2020-02-13 ENCOUNTER — Other Ambulatory Visit: Payer: Self-pay

## 2020-02-13 DIAGNOSIS — E291 Testicular hypofunction: Secondary | ICD-10-CM | POA: Diagnosis not present

## 2020-02-13 MED ORDER — TESTOSTERONE CYPIONATE 200 MG/ML IM SOLN
200.0000 mg | Freq: Once | INTRAMUSCULAR | Status: AC
  Administered 2020-02-13: 200 mg via INTRAMUSCULAR

## 2020-02-13 NOTE — Progress Notes (Signed)
Testosterone IM Injection  Due to Hypogonadism patient is present today for a Testosterone Injection.  Medication: Testosterone Cypionate Dose: 43ml Location: right upper outer buttocks Lot: [id number] Exp:12/2020  Patient tolerated well, no complications were noted   Performed by: Verlene Mayer, CMA  Follow up: 2 weeks. Lab appointment scheduled and follow up with Dr. Bernardo Heater scheduled. Patient aware.

## 2020-02-26 ENCOUNTER — Other Ambulatory Visit: Payer: Medicare Other

## 2020-02-26 ENCOUNTER — Other Ambulatory Visit: Payer: Self-pay

## 2020-02-26 DIAGNOSIS — E291 Testicular hypofunction: Secondary | ICD-10-CM

## 2020-02-27 ENCOUNTER — Other Ambulatory Visit: Payer: Medicare Other

## 2020-02-27 LAB — TESTOSTERONE: Testosterone: 340 ng/dL (ref 264–916)

## 2020-03-02 ENCOUNTER — Telehealth: Payer: Self-pay | Admitting: *Deleted

## 2020-03-02 ENCOUNTER — Ambulatory Visit (INDEPENDENT_AMBULATORY_CARE_PROVIDER_SITE_OTHER): Payer: Medicare Other

## 2020-03-02 ENCOUNTER — Other Ambulatory Visit: Payer: Self-pay

## 2020-03-02 DIAGNOSIS — E291 Testicular hypofunction: Secondary | ICD-10-CM

## 2020-03-02 MED ORDER — TESTOSTERONE CYPIONATE 200 MG/ML IM SOLN
200.0000 mg | Freq: Once | INTRAMUSCULAR | Status: AC
  Administered 2020-03-02: 200 mg via INTRAMUSCULAR

## 2020-03-02 NOTE — Progress Notes (Signed)
Testosterone IM Injection  Due to Hypogonadism patient is present today for a Testosterone Injection.  Medication: Testosterone Cypionate Dose: 1 mL Location: right upper outer buttocks Lot: [id number] Exp:12/2020  Patient tolerated well, no complications were noted  Preformed by: Bradly Bienenstock, CMA  Follow up: 2 weeks for testosterone. Office visit with Dr. Bernardo Heater 03/12/20.

## 2020-03-02 NOTE — Telephone Encounter (Signed)
Notified patient as instructed, patient pleased. Discussed follow-up appointments, patient agrees  

## 2020-03-02 NOTE — Telephone Encounter (Signed)
-----   Message from Abbie Sons, MD sent at 03/01/2020 11:27 AM EDT ----- Testosterone level was 340.  Follow-up as scheduled

## 2020-03-09 ENCOUNTER — Ambulatory Visit: Payer: Self-pay | Admitting: Urology

## 2020-03-12 ENCOUNTER — Ambulatory Visit (INDEPENDENT_AMBULATORY_CARE_PROVIDER_SITE_OTHER): Payer: Medicare Other | Admitting: Urology

## 2020-03-12 ENCOUNTER — Encounter: Payer: Self-pay | Admitting: Urology

## 2020-03-12 ENCOUNTER — Other Ambulatory Visit: Payer: Self-pay

## 2020-03-12 VITALS — BP 123/59 | HR 64 | Ht 67.0 in | Wt 176.0 lb

## 2020-03-12 DIAGNOSIS — E291 Testicular hypofunction: Secondary | ICD-10-CM

## 2020-03-12 MED ORDER — TESTOSTERONE CYPIONATE 200 MG/ML IM SOLN: 240.0000 mg | INTRAMUSCULAR | 0 refills | Status: DC

## 2020-03-12 MED ORDER — TESTOSTERONE CYPIONATE 200 MG/ML IM SOLN: 140.0000 mg | INTRAMUSCULAR | 0 refills | Status: DC

## 2020-03-12 MED ORDER — TESTOSTERONE CYPIONATE 200 MG/ML IM SOLN
200.0000 mg | Freq: Once | INTRAMUSCULAR | Status: AC
  Administered 2020-03-12: 240 mg via INTRAMUSCULAR

## 2020-03-12 NOTE — Progress Notes (Signed)
Testosterone IM Injection  Due to Hypogonadism patient is present today for a Testosterone Injection.  Medication: Testosterone Cypionate Dose:1.48ml  Location: right upper outer buttocks Lot: [id number]  Exp20/2022  Patient tolerated well, no complications were noted  Preformed by: Gaspar Cola CMA   Follow up:2 weeks

## 2020-03-12 NOTE — Progress Notes (Signed)
03/12/2020 2:25 PM   [name] 1951-01-04 [id number]  Referring provider: Idelle Crouch, MD [facility] [place],  [place] [address]  Chief Complaint  Patient presents with  . Hypogonadism    HPI: 69 y.o. male for follow-up of hypogonadism.  He is receiving injections in office 200 mg every 2 weeks.  He does have better energy level.  Recent midcycle testosterone level was 340.  No bothersome lower urinary tract symptoms or lower extremity edema.  He had shoulder surgery in October with 2 dislocations, subsequent revision that infection and is just now starting to feel better.  Denies flank or abdominal pain.   PMH: Past Medical History:  Diagnosis Date  . Anxiety   . Cancer ([place])   . Depression   . Hypertension   . Parkinson's disease [facility])     Surgical History: Past Surgical History:  Procedure Laterality Date  . BACK SURGERY    . CHOLECYSTECTOMY    . DEEP BRAIN STIMULATOR PLACEMENT     for Parkinson's disease  . JOINT REPLACEMENT     left knee x2  . ORCHIECTOMY  1983    Home Medications:  Allergies as of 03/12/2020      Reactions   Phenergan [promethazine Hcl] Other (See Comments)   Unknown    Reglan [metoclopramide] Other (See Comments)   Pass out       Medication List       Accurate as of Mar 12, 2020  2:25 PM. If you have any questions, ask your nurse or doctor.        acetaminophen 325 MG tablet Commonly known as: TYLENOL Take 925 mg by mouth every 6 (six) hours as needed for mild pain or moderate pain.   amantadine 100 MG capsule Commonly known as: SYMMETREL Take 1 capsule (100 mg total) by mouth daily.   aspirin 325 MG tablet Take 325 mg by mouth 2 (two) times daily.   atorvastatin 20 MG tablet Commonly known as: LIPITOR Take 1 tablet (20 mg total) by mouth daily.   carbidopa-levodopa 25-100 MG tablet Commonly known as: SINEMET IR Take 1 tablet orally two times a day at 7am, and 10pm. Take 0.5  tablet orally at 10am, 1pm, 4pm, and 7pm.   ketoconazole 2 % cream Commonly known as: NIZORAL Apply 1 application topically daily. Apply to flaky areas daily as needed   losartan 100 MG tablet Commonly known as: COZAAR Take 1 tablet (100 mg total) by mouth daily.   Melatonin 3 MG Caps Take 1-2 tablets by mouth 2 hours before bedtime   meloxicam 7.5 MG tablet Commonly known as: MOBIC Take 1 tablet (7.5 mg total) by mouth daily.   metoprolol succinate 25 MG 24 hr tablet Commonly known as: TOPROL-XL Take 1 tablet (25 mg total) by mouth 2 (two) times daily.   metoprolol tartrate 25 MG tablet Commonly known as: LOPRESSOR TAKE 1 TABLET TWICE DAILY   NON FORMULARY Diet Type: Regular   omeprazole 20 MG tablet Commonly known as: PRILOSEC OTC Take 40 mg by mouth daily.   Potassium Citrate 15 MEQ (1620 MG) Tbcr Take 1 tablet by mouth 2 (two) times daily.   ranolazine 500 MG 12 hr tablet Commonly known as: RANEXA   rOPINIRole 2 MG tablet Commonly known as: REQUIP Take 1 tablet (2 mg total) by mouth every 3 (three) hours. At 7am, and 1pm.   selegiline 5 MG capsule Commonly known as: ELDEPRYL Take 1 capsule (5  mg total) by mouth 2 (two) times daily. At 7am and 1pm.   tadalafil 10 MG tablet Commonly known as: CIALIS Take 20mg  by mouth daily as needed   testosterone cypionate 200 MG/ML injection Commonly known as: DEPOTESTOSTERONE CYPIONATE INJECT 1ML INTO THE MUSCLE EVERY 14 DAYSAS DIRECTED   traZODone 150 MG tablet Commonly known as: DESYREL Take 0.5 tablets (75 mg total) by mouth at bedtime as needed.       Allergies:  Allergies  Allergen Reactions  . Phenergan [Promethazine Hcl] Other (See Comments)    Unknown   . Reglan [Metoclopramide] Other (See Comments)    Pass out     Family History: No family history on file.  Social History:  reports that he has never smoked. He has never used smokeless tobacco. He reports current alcohol use. He reports current  drug use. Drug: Marijuana.   Physical Exam: BP (!) 123/59   Pulse 64   Ht 5\' 7"  (1.702 m)   Wt 176 lb (79.8 kg)   BMI 27.57 kg/m   Constitutional:  Alert and oriented, No acute distress. HEENT: [place] AT, moist mucus membranes.  Trachea midline, no masses. Cardiovascular: No clubbing, cyanosis, or edema. Respiratory: Normal respiratory effort, no increased work of breathing.   Assessment & Plan:    - Hypogonadism Midcycle testosterone level was low normal at 340.  Will increase to 240 mg every 2 weeks.  Repeat testosterone level in 1 month.   Abbie Sons, [place] [address], [place], [place] [address] [phone]

## 2020-03-13 ENCOUNTER — Encounter: Payer: Self-pay | Admitting: Urology

## 2020-03-16 ENCOUNTER — Ambulatory Visit: Payer: Self-pay

## 2020-03-30 ENCOUNTER — Encounter: Payer: Self-pay | Admitting: Urology

## 2020-03-30 ENCOUNTER — Ambulatory Visit: Payer: Medicare Other

## 2020-04-02 ENCOUNTER — Ambulatory Visit (INDEPENDENT_AMBULATORY_CARE_PROVIDER_SITE_OTHER): Payer: Medicare Other | Admitting: Family Medicine

## 2020-04-02 ENCOUNTER — Other Ambulatory Visit: Payer: Self-pay

## 2020-04-02 DIAGNOSIS — E291 Testicular hypofunction: Secondary | ICD-10-CM

## 2020-04-02 NOTE — Progress Notes (Signed)
Testosterone IM Injection  Due to Hypogonadism patient is present today for a Testosterone Injection.  Medication: Testosterone Cypionate Dose: 1.94ml Location: right upper outer buttocks Lot: [id number] a Exp:12/2020  Patient tolerated well, no complications were noted  Preformed by: Elberta Leatherwood, CMA  Follow up: Patient is going on vacation and will get next injection 04/20/2020

## 2020-04-20 ENCOUNTER — Ambulatory Visit: Payer: Medicare Other

## 2020-04-21 ENCOUNTER — Encounter: Payer: Self-pay | Admitting: Urology

## 2020-04-22 ENCOUNTER — Ambulatory Visit: Payer: Medicare Other | Admitting: Physical Therapy

## 2020-04-27 ENCOUNTER — Ambulatory Visit: Payer: Self-pay

## 2020-04-27 ENCOUNTER — Other Ambulatory Visit: Payer: Self-pay

## 2020-04-29 ENCOUNTER — Other Ambulatory Visit: Payer: Self-pay

## 2020-04-29 ENCOUNTER — Encounter: Payer: Self-pay | Admitting: Physical Therapy

## 2020-04-29 ENCOUNTER — Ambulatory Visit: Payer: Medicare Other | Attending: Orthopedic Surgery | Admitting: Physical Therapy

## 2020-04-29 DIAGNOSIS — M25611 Stiffness of right shoulder, not elsewhere classified: Secondary | ICD-10-CM

## 2020-04-29 DIAGNOSIS — R262 Difficulty in walking, not elsewhere classified: Secondary | ICD-10-CM

## 2020-04-29 DIAGNOSIS — M25511 Pain in right shoulder: Secondary | ICD-10-CM | POA: Diagnosis present

## 2020-04-29 DIAGNOSIS — M6281 Muscle weakness (generalized): Secondary | ICD-10-CM | POA: Diagnosis present

## 2020-04-29 DIAGNOSIS — G8929 Other chronic pain: Secondary | ICD-10-CM | POA: Diagnosis present

## 2020-04-29 DIAGNOSIS — R278 Other lack of coordination: Secondary | ICD-10-CM

## 2020-04-29 DIAGNOSIS — M25561 Pain in right knee: Secondary | ICD-10-CM | POA: Insufficient documentation

## 2020-04-29 DIAGNOSIS — R2681 Unsteadiness on feet: Secondary | ICD-10-CM

## 2020-04-29 NOTE — Therapy (Signed)
[place] MAIN [facility] SERVICES [address] [place], [place], [address] Phone: [phone]   Fax:  [phone]  Physical Therapy Evaluation  Patient Details  Name: Charles Choi MRN: [id number] Date of Birth: 11-18-1950 Referring Provider (PT): gage, mark   Encounter Date: 04/29/2020   PT End of Session - 04/29/20 1710    Visit Number 1    Number of Visits 17    Date for PT Re-Evaluation 06/24/20    PT Start Time 0505    PT Stop Time 0600    PT Time Calculation (min) 55 min    Activity Tolerance Patient tolerated treatment well           Past Medical History:  Diagnosis Date  . Anxiety   . Cancer ([place])   . Depression   . Hypertension   . Parkinson's disease [facility])     Past Surgical History:  Procedure Laterality Date  . BACK SURGERY    . CHOLECYSTECTOMY    . DEEP BRAIN STIMULATOR PLACEMENT     for Parkinson's disease  . JOINT REPLACEMENT     left knee x2  . ORCHIECTOMY  1983    There were no vitals filed for this visit.    Subjective Assessment - 04/29/20 1717    Subjective Patient had a r TSA revisiion surgery in feb .    Pertinent History Charles Choi is a 69 y.o. male patient with h/o Parkinson's dz with deep brain stimulator, HLD, HTN, CKD, and CAD. Patient had rTSA 08/27/19 ARTHROPLASTY, TOTAL SHOULDER, REVERSE TENODESIS OF LONG TENDON OF BICEPS, Patient had Right anterior shoulder dislocation  10/17/19, and 12/10/19 REVISION OF TOTAL SHOULDER ARTHROPLASTY. He had rehabilitation in SNF and then recently had HHPT ending of May 2021   Currently in Pain? 4/10            Pain Assessment    Currently in Pain? Yes   Pain Score 4    Pain Location Shoulder   Pain Orientation Right   Pain Descriptors / Indicators Aching   Pain Type Chronic pain   Pain Radiating Towards --   Pain Onset Other (comment)   Pain Frequency Intermittent   Aggravating Factors  PROM/ AROM and isometics   Pain Relieving Factors rest    Effect of Pain on Daily Activities takes longer to do activities   Multiple Pain Sites No        [facility] PT Assessment - 04/29/20 1724      Assessment   Medical Diagnosis right shoulder RSR    Referring Provider (PT) gage, mark    Onset Date/Surgical Date 12/09/19    Hand Dominance Right    Prior Therapy HHPT      Precautions   Precautions None      Restrictions   Weight Bearing Restrictions No      Balance Screen   Has the patient fallen in the past 6 months No    Has the patient had a decrease in activity level because of a fear of falling?  Yes    Is the patient reluctant to leave their home because of a fear of falling?  No      Home Environment   Living Environment Private residence    Living Arrangements Alone    Available Help at Discharge Family    Type of [place] to enter    Entrance Stairs-Number of Steps 4    Entrance Stairs-Rails Right  [place] - 2 wheels;Cane - single point;Shower seat;Grab bars - toilet;Grab bars - tub/shower      Prior Function   Level of Independence Independent with household mobility with device    Vocation Retired      Associate Professor   Overall Cognitive Status Within Functional Limits for tasks assessed            PAIN: intermittent ranging from 2/10-7/10, pain with isometric MMT ER   POSTURE: WFL    PROM/AROM: PROM RUE: R  Flex 0-120 deg, abd 0-90 deg, ER 0-30  AROM RUE  flex 0-115 deg , abduction 0-70 deg, 30 deg ER   STRENGTH:  Graded on a 0-5 scale Muscle Group Left Right  Shoulder flex -3/5 -3/5  Shoulder Abd -3/5 -3/5  Shoulder Ext 3/5 -3/5  Shoulder IR/ER 3/5 -3/5  Elbow 5/5 5/5  Wrist/hand 5/5 5/5                                      * pain with ER greater than 30 deg and with isometric ER SENSATION:  BUE : WNL BLE : WNL  NEUROLOGICAL SCREEN: (2+ unless otherwise noted.) N=normal  Ab=abnormal   Level Dermatome R L  C3 Anterior Neck   N N  C4 Top of Shoulder N N  C5 Lateral Upper Arm  N N  C6 Lateral Arm/ Thumb  N N  C7 Middle Finger  N N  C8 4th & 5th Finger N N  T1 Medial Arm N N                                  SOMATOSENSORY:   Numbness & Tingling in extremities or weakness: reports :         Sensation           Intact      Diminished         Absent  Light touch UEs                              Accessory motions:  R shoulder : deferred        OUTCOME MEASURES: TEST Outcome Interpretation  Quick dash  70.45 0 % = no disability                                  Objective measurements completed on examination: See above findings.               PT Education - 04/29/20 1710    Education Details plan of care    Person(s) Educated Patient    Methods Explanation    Comprehension Verbalized understanding            PT Short Term Goals - 04/30/20 1123      PT SHORT TERM GOAL #1   Title Patient will be independent in home exercise program to improve strength/mobility for better functional independence with ADLs.    Baseline Patient is not able to reach behind his back to wash it.    Time 4    Period Weeks    Status New    Target Date 05/28/20  PT SHORT TERM GOAL #2   Title Patient will increase BLE gross strength to 4+/5 as to improve functional strength for independent gait, increased standing tolerance and increased ADL ability.    Baseline 04/29/20= -3/5 strength R shoulder flex/abd/ER/IR    Time 4    Status New    Target Date 05/28/20             PT Long Term Goals - 04/30/20 1128      PT LONG TERM GOAL #1   Title Patient will report a worst pain of 3/10 on VAS in R shoulder to improve tolerance with ADLs and reduced symptoms with activities.    Baseline 04/29/20= 4/10 right shoulder    Time 8    Period Weeks    Status New    Target Date 06/25/20      PT LONG TERM GOAL #2   Title Patient will improve shoulder AROM to > 135 degrees of flexion,  scaption, and abduction for improved ability to perform overhead activities, ER30 active, IR functionally to L 1.    Time 8    Period Weeks    Status New    Target Date 06/25/20      PT LONG TERM GOAL #3   Title Patient will improve shoulder elevation and ER in scapular plane  PROM to > 140 degrees of flexion, ER 40 deg passively    Baseline 04/29/20= see evaluation    Time 8    Period Weeks    Status New    Target Date 06/25/20      PT LONG TERM GOAL #4   Title Patient will improve his quick dash score and FOTO score to indicate functional improvements    Baseline quick dash score = 70.45    Time 8    Period Weeks    Status New    Target Date 06/25/20                  Plan - 04/29/20 1711    Clinical Impression Statement Patient presents to PT evaluation S/P r TSA 08/27/19, and revision r TSA,  12/10/19. He reports pain that is intermittent R shoulder. He has right shoulder weakness and decreased ROM R shoulder. He is able to dress himself , shower and complete personal hygiene. He is currently 20 weeks post op, and continues to be in phase 3 of r TSA protocol. He does not have equal R shoulder AROM and PROM with good mechanics for elevation. He has R shoulder pain with ER past 30-35 deg and pain with shoulder flex > 120, and R shoulder abd > 90 deg. He will benefit from skilled PT to improve ROM, strength and decrease pain to right shoulder.    Personal Factors and Comorbidities Age    Examination-Activity Limitations Bed Mobility;Locomotion Level;Reach Overhead;Bathing;Carry;Lift;Stand    Examination-Participation Restrictions Community Activity;Cleaning;Church;Driving;Meal Prep;Yard Work    Stability/Clinical Decision Making Stable/Uncomplicated    Clinical Decision Making Moderate    Rehab Potential Good    PT Frequency 2x / week    PT Duration 8 weeks    PT Treatment/Interventions Joint Manipulations;Dry needling;Passive range of motion;Cryotherapy;Electrical  Stimulation;Moist Heat;Ultrasound;Therapeutic activities;Functional mobility training;Therapeutic exercise;Patient/family education;Manual techniques    PT Next Visit Plan ROM ,           Patient will benefit from skilled therapeutic intervention in order to improve the following deficits and impairments:  Decreased range of motion, Decreased strength, Impaired flexibility, Decreased safety awareness, Decreased mobility, Pain,  Decreased activity tolerance  Visit Diagnosis: Other lack of coordination  Muscle weakness (generalized)  Unsteadiness on feet  Difficulty in walking, not elsewhere classified  Chronic pain of right knee  Chronic right shoulder pain  Stiffness of right shoulder, not elsewhere classified     Problem List Patient Active Problem List   Diagnosis Date Noted  . Arthritis of left shoulder region 05/08/2019  . Abnormal glucose 12/05/2018  . Essential hypertension, benign 09/15/2018  . Dyslipidemia 09/15/2018  . Abnormal stress test 07/20/2018  . Coronary artery disease involving native coronary artery of native heart 07/20/2018  . CKD (chronic kidney disease) stage 3, GFR 30-59 ml/min 07/13/2018  . Erectile dysfunction 07/13/2018  . Insomnia 07/13/2018  . Hypogonadism in male 06/18/2018  . Testicular cancer ([place]) 06/13/2018  . Hyperlipidemia 06/13/2018  . Depression 06/13/2018  . Anxiety 06/13/2018  . Callus of foot 03/27/2018  . Pes planovalgus, acquired, right 01/30/2018  . Arthritis of midfoot 01/30/2018  . Achilles tendon contracture, right 01/30/2018  . Left shoulder pain 07/26/2017  . Infection of prosthetic left knee joint ([place]) 07/10/2017  . 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[place]) 05/15/2017  . GERD (gastroesophageal reflux disease) 03/20/2017  . Primary osteoarthritis of one knee, right 12/30/2016  . H/O total knee replacement, left 12/30/2016  . Moderate recurrent major depression ([place]) 08/15/2016  . Chest pain due to myocardial  ischemia 08/14/2016  . Chest pain, rule out acute myocardial infarction 08/14/2016  . Chest x-ray abnormality 10/17/2013  . Scoliosis 07/11/2012  . Arthritis of wrist, left, degenerative 07/11/2012  . Parkinson's disease ([place]) 01/07/2012    Alanson Puls, PT DPT 04/30/2020, 11:44 AM  [place] MAIN [facility] SERVICES [address] [place], [place], [address] Phone: [phone]   Fax:  [phone]  Name: Charles Choi MRN: [id number] Date of Birth: 1950-12-05

## 2020-04-30 ENCOUNTER — Other Ambulatory Visit: Payer: Self-pay | Admitting: Family Medicine

## 2020-04-30 DIAGNOSIS — E291 Testicular hypofunction: Secondary | ICD-10-CM

## 2020-05-04 ENCOUNTER — Ambulatory Visit: Payer: Self-pay

## 2020-05-04 ENCOUNTER — Ambulatory Visit: Payer: Medicare Other

## 2020-05-04 ENCOUNTER — Encounter: Payer: Self-pay | Admitting: Urology

## 2020-05-04 ENCOUNTER — Other Ambulatory Visit: Payer: Self-pay

## 2020-05-04 DIAGNOSIS — M6281 Muscle weakness (generalized): Secondary | ICD-10-CM

## 2020-05-04 DIAGNOSIS — R278 Other lack of coordination: Secondary | ICD-10-CM | POA: Diagnosis not present

## 2020-05-04 DIAGNOSIS — R2681 Unsteadiness on feet: Secondary | ICD-10-CM

## 2020-05-04 NOTE — Therapy (Signed)
[place] MAIN [facility] SERVICES [address] [place], [place], [address] Phone: [phone]   Fax:  [phone]  Physical Therapy Treatment  Patient Details  Name: Charles Choi MRN: [id number] Date of Birth: August 01, 1951 Referring Provider (PT): gage, mark   Encounter Date: 05/04/2020   PT End of Session - 05/04/20 1443    Visit Number 2    Number of Visits 17    Date for PT Re-Evaluation 06/24/20    PT Start Time 1440    PT Stop Time 1515    PT Time Calculation (min) 35 min    Activity Tolerance Patient tolerated treatment well    Behavior During Therapy [facility] for tasks assessed/performed           Past Medical History:  Diagnosis Date  . Anxiety   . Cancer ([place])   . Depression   . Hypertension   . Parkinson's disease [facility])     Past Surgical History:  Procedure Laterality Date  . BACK SURGERY    . CHOLECYSTECTOMY    . DEEP BRAIN STIMULATOR PLACEMENT     for Parkinson's disease  . JOINT REPLACEMENT     left knee x2  . ORCHIECTOMY  1983    There were no vitals filed for this visit.   Subjective Assessment - 05/04/20 1442    Subjective Pt reports that he is doing alright today. Denies any resting R shoulder pain upon arrival today but does have pain with movement. No specific questions or concerns upon arrival.    Pertinent History Patient had TSR reverse 08/27/19, Patient had 2 dislocations around 10/24/19, and again [place]. Then in [place] he had a revision to right shulder. He had HHPT and it ended 3 weeks ago.    Currently in Pain? No/denies   Confirms pain with movement              TREATMENT   Manual Therapy  Pt performed R shoulder AROM flexion and abduction in sitting for assessment; R shoulder AROM HBH, HBB in sitting for assessment; PROM R shoulder flexion and abduction within comfortable range and never pushing through resistance x multiple bouts each, very mild end range pain reported; PROM R  shoulder in scapular plane into ER to 40 degrees, no pain and no resistance encountered; STM including intermittent instrument assist to R anterior, lateral, and posterior shoulder musculature. Pt does have a notable trigger point in the anterior portion of the deltoid;   Ther-ex  Supine R shoulder AROM flexion to available end range x 10 with light assist as needed; L sidelying R shoulder AROM abduction/scaption to available end range x 10 with light assist as needed;  Seated R shoulder function IR x 2; Pt issued written HEP with instructions about how to perform correctly/safely;   Pt educated throughout session about proper posture and technique with exercises. Improved exercise technique, movement at target joints, use of target muscles after min to mod verbal, visual, tactile cues.    Pt demonstrates excellent motivation today during session. He demonstrates AROM R shoulder flexion and abduction which is close to PROM in supine. Will likely be appropriate to progress to short arm flexion and abduction on incline during next session. Pt provided HEP today which will be progressed as appropriate. Pt encouraged to follow-up as scheduled. Pt will benefit from PT services to address deficits in R shoulder strength in order to improve function at home.  PT Short Term Goals - 04/30/20 1123      PT SHORT TERM GOAL #1   Title Patient will be independent in home exercise program to improve strength/mobility for better functional independence with ADLs.    Baseline Patient is not able to reach behind his back to wash it.    Time 4    Period Weeks    Status New    Target Date 05/28/20      PT SHORT TERM GOAL #2   Title Patient will increase BLE gross strength to 4+/5 as to improve functional strength for independent gait, increased standing tolerance and increased ADL ability.    Baseline 04/29/20= -3/5 strength R shoulder flex/abd/ER/IR    Time 4     Status New    Target Date 05/28/20             PT Long Term Goals - 04/30/20 1128      PT LONG TERM GOAL #1   Title Patient will report a worst pain of 3/10 on VAS in R shoulder to improve tolerance with ADLs and reduced symptoms with activities.    Baseline 04/29/20= 4/10 right shoulder    Time 8    Period Weeks    Status New    Target Date 06/25/20      PT LONG TERM GOAL #2   Title Patient will improve shoulder AROM to > 135 degrees of flexion, scaption, and abduction for improved ability to perform overhead activities, ER30 active, IR functionally to L 1.    Time 8    Period Weeks    Status New    Target Date 06/25/20      PT LONG TERM GOAL #3   Title Patient will improve shoulder elevation and ER in scapular plane  PROM to > 140 degrees of flexion, ER 40 deg passively    Baseline 04/29/20= see evaluation    Time 8    Period Weeks    Status New    Target Date 06/25/20      PT LONG TERM GOAL #4   Title Patient will improve his quick dash score and FOTO score to indicate functional improvements    Baseline quick dash score = 70.45    Time 8    Period Weeks    Status New    Target Date 06/25/20                 Plan - 05/04/20 1443    Clinical Impression Statement Pt demonstrates excellent motivation today during session. He demonstrates AROM R shoulder flexion and abduction which is close to PROM in supine. Will likely be appropriate to progress to short arm flexion and abduction on incline during next session. Pt provided HEP today which will be progressed as appropriate. Pt encouraged to follow-up as scheduled. Pt will benefit from PT services to address deficits in R shoulder strength in order to improve function at home.    Personal Factors and Comorbidities Age    Examination-Activity Limitations Bed Mobility;Locomotion Level;Reach Overhead;Bathing;Carry;Lift;Stand    Examination-Participation Restrictions Community Activity;Cleaning;Church;Driving;Meal  Prep;Yard Work    Stability/Clinical Decision Making Stable/Uncomplicated    Rehab Potential Good    PT Frequency 2x / week    PT Duration 8 weeks    PT Treatment/Interventions Joint Manipulations;Dry needling;Passive range of motion;Cryotherapy;Electrical Stimulation;Moist Heat;Ultrasound;Therapeutic activities;Functional mobility training;Therapeutic exercise;Patient/family education;Manual techniques    PT Next Visit Plan ROM    PT Home Exercise Plan Mebridge Access Code: [email or web address]  Consulted and Agree with Plan of Care Patient           Patient will benefit from skilled therapeutic intervention in order to improve the following deficits and impairments:  Decreased range of motion, Decreased strength, Impaired flexibility, Decreased safety awareness, Decreased mobility, Pain, Decreased activity tolerance  Visit Diagnosis: Muscle weakness (generalized)  Unsteadiness on feet     Problem List Patient Active Problem List   Diagnosis Date Noted  . Arthritis of left shoulder region 05/08/2019  . Abnormal glucose 12/05/2018  . Essential hypertension, benign 09/15/2018  . Dyslipidemia 09/15/2018  . Abnormal stress test 07/20/2018  . Coronary artery disease involving native coronary artery of native heart 07/20/2018  . CKD (chronic kidney disease) stage 3, GFR 30-59 ml/min 07/13/2018  . Erectile dysfunction 07/13/2018  . Insomnia 07/13/2018  . Hypogonadism in male 06/18/2018  . Testicular cancer ([place]) 06/13/2018  . Hyperlipidemia 06/13/2018  . Depression 06/13/2018  . Anxiety 06/13/2018  . Callus of foot 03/27/2018  . Pes planovalgus, acquired, right 01/30/2018  . Arthritis of midfoot 01/30/2018  . Achilles tendon contracture, right 01/30/2018  . Left shoulder pain 07/26/2017  . Infection of prosthetic left knee joint ([place]) 07/10/2017  . 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[place]) 05/15/2017  . GERD (gastroesophageal reflux disease) 03/20/2017  . Primary osteoarthritis of one  knee, right 12/30/2016  . H/O total knee replacement, left 12/30/2016  . Moderate recurrent major depression ([place]) 08/15/2016  . Chest pain due to myocardial ischemia 08/14/2016  . Chest pain, rule out acute myocardial infarction 08/14/2016  . Chest x-ray abnormality 10/17/2013  . Scoliosis 07/11/2012  . Arthritis of wrist, left, degenerative 07/11/2012  . Parkinson's disease ([place]) 01/07/2012   Phillips Grout PT, DPT, GCS  Astrid Vides 05/04/2020, 4:50 PM  [place] MAIN [facility] SERVICES [address] [place], [place], [address] Phone: [phone]   Fax:  [phone]  Name: Charles Choi MRN: [id number] Date of Birth: 02-Apr-1951

## 2020-05-04 NOTE — Patient Instructions (Signed)
Access Code: [email or web address] URL: [email or web address] Date: 05/04/2020 Prepared by: Roxana Hires  Exercises Supine Shoulder Flexion Extension Full Range AROM - 2 x daily - 7 x weekly - 2 sets - 10 reps - 3s hold Sidelying Shoulder Abduction Palm Forward - 2 x daily - 7 x weekly - 2 sets - 10 reps - 3s hold Standing Shoulder Internal Rotation AROM Behind Back - 2 x daily - 7 x weekly - 2 sets - 10 reps - 3s hold

## 2020-05-05 ENCOUNTER — Ambulatory Visit: Payer: Medicare Other

## 2020-05-05 ENCOUNTER — Other Ambulatory Visit: Payer: Self-pay | Admitting: Urology

## 2020-05-05 DIAGNOSIS — N5201 Erectile dysfunction due to arterial insufficiency: Secondary | ICD-10-CM

## 2020-05-06 ENCOUNTER — Other Ambulatory Visit: Payer: Self-pay

## 2020-05-06 ENCOUNTER — Ambulatory Visit: Payer: Medicare Other

## 2020-05-06 DIAGNOSIS — G8929 Other chronic pain: Secondary | ICD-10-CM

## 2020-05-06 DIAGNOSIS — M6281 Muscle weakness (generalized): Secondary | ICD-10-CM

## 2020-05-06 DIAGNOSIS — R278 Other lack of coordination: Secondary | ICD-10-CM | POA: Diagnosis not present

## 2020-05-06 DIAGNOSIS — M25611 Stiffness of right shoulder, not elsewhere classified: Secondary | ICD-10-CM

## 2020-05-06 NOTE — Therapy (Signed)
[place] MAIN [facility] SERVICES [address] [place], [place], [address] Phone: [phone]   Fax:  [phone]  Physical Therapy Treatment  Patient Details  Name: DATRON BRAKEBILL MRN: [id number] Date of Birth: 31-Mar-1951 Referring Provider (PT): gage, mark   Encounter Date: 05/06/2020   PT End of Session - 05/06/20 1622    Visit Number 3    Number of Visits 17    Date for PT Re-Evaluation 06/24/20    PT Start Time 9381    PT Stop Time 1600    PT Time Calculation (min) 45 min    Activity Tolerance Patient tolerated treatment well    Behavior During Therapy [facility] for tasks assessed/performed           Past Medical History:  Diagnosis Date   Anxiety    Cancer ([place])    Depression    Hypertension    Parkinson's disease ([place])     Past Surgical History:  Procedure Laterality Date   BACK [place]     for Parkinson's disease   JOINT REPLACEMENT     left knee x2   ORCHIECTOMY  1983    There were no vitals filed for this visit.   Subjective Assessment - 05/06/20 1523    Subjective Pt reports that he is doing alright today. Denies any resting R shoulder pain upon arrival today but does have pain with movement. He was able to perform some AROM of R shoulder as part of his HEP but "not as much as I should." No specific questions or concerns upon arrival.    Pertinent History Patient had TSR reverse 08/27/19, Patient had 2 dislocations around 10/24/19, and again [place]. Then in [place] he had a revision to right shulder. He had HHPT and it ended 3 weeks ago.    Currently in Pain? No/denies             TREATMENT   Manual Therapy  PROM R shoulder flexion and abduction within comfortable range and never pushing through resistance x multiple bouts each, very mild end range pain reported; PROM R shoulder in scapular plane into ER to 40 degrees, no pain and no resistance  encountered; STM to R anterior, lateral, and posterior shoulder musculature. Also performed STM to rhomboid major/minor with significant trigger points.    Ther-ex  Supine R shoulder AROM flexion to available end range with long lever x 10, light assist as needed; Supine inclined (45 degrees) R shoulder AROM flexion to available end range with short lever x 10, light assist as needed; Supine inclined (45 degrees) R shoulder AROM flexion to available end range with long lever x 10, light assist as needed; L sidelying R shoulder AROM abduction/scaption to available end range with long lever x 10, light assist as needed; L sidelying inclined (45 degrees) R shoulder AROM flexion to available end range with long lever x 10, light assist as needed; L sidelying R scapular isometric retraction 5s hold x 10; L sidleying R scapular isometric depression 5s hold x 10;   Pt educated throughout session about proper posture and technique with exercises. Improved exercise technique, movement at target joints, use of target muscles after min to mod verbal, visual, tactile cues.    Pt demonstrates excellent motivation today during session. He demonstrates AROM R shoulder flexion and abduction which is close to PROM in supine. Progressed to 45 degree incline and pt  can perform both flexion and abduction/scaption with long lever. He does demonstrate increase in muscle fatigue in this position and is not quite ready to perform in full upright position. Pt encouraged to continue HEP and follow-up as scheduled. He will benefit from PT services to address deficits in R shoulder strength in order to improve function at home.                                PT Short Term Goals - 04/30/20 1123      PT SHORT TERM GOAL #1   Title Patient will be independent in home exercise program to improve strength/mobility for better functional independence with ADLs.    Baseline Patient is not able to reach  behind his back to wash it.    Time 4    Period Weeks    Status New    Target Date 05/28/20      PT SHORT TERM GOAL #2   Title Patient will increase BLE gross strength to 4+/5 as to improve functional strength for independent gait, increased standing tolerance and increased ADL ability.    Baseline 04/29/20= -3/5 strength R shoulder flex/abd/ER/IR    Time 4    Status New    Target Date 05/28/20             PT Long Term Goals - 04/30/20 1128      PT LONG TERM GOAL #1   Title Patient will report a worst pain of 3/10 on VAS in R shoulder to improve tolerance with ADLs and reduced symptoms with activities.    Baseline 04/29/20= 4/10 right shoulder    Time 8    Period Weeks    Status New    Target Date 06/25/20      PT LONG TERM GOAL #2   Title Patient will improve shoulder AROM to > 135 degrees of flexion, scaption, and abduction for improved ability to perform overhead activities, ER30 active, IR functionally to L 1.    Time 8    Period Weeks    Status New    Target Date 06/25/20      PT LONG TERM GOAL #3   Title Patient will improve shoulder elevation and ER in scapular plane  PROM to > 140 degrees of flexion, ER 40 deg passively    Baseline 04/29/20= see evaluation    Time 8    Period Weeks    Status New    Target Date 06/25/20      PT LONG TERM GOAL #4   Title Patient will improve his quick dash score and FOTO score to indicate functional improvements    Baseline quick dash score = 70.45    Time 8    Period Weeks    Status New    Target Date 06/25/20                 Plan - 05/06/20 1622    Clinical Impression Statement Pt demonstrates excellent motivation today during session. He demonstrates AROM R shoulder flexion and abduction which is close to PROM in supine. Progressed to 45 degree incline and pt can perform both flexion and abduction/scaption with long lever. He does demonstrate increase in muscle fatigue in this position and is not quite ready to  perform in full upright position. Pt encouraged to continue HEP and follow-up as scheduled. He will benefit from PT services to address deficits in R shoulder  strength in order to improve function at home.    Personal Factors and Comorbidities Age    Examination-Activity Limitations Bed Mobility;Locomotion Level;Reach Overhead;Bathing;Carry;Lift;Stand    Examination-Participation Restrictions Community Activity;Cleaning;Church;Driving;Meal Prep;Yard Work    Stability/Clinical Decision Making Stable/Uncomplicated    Rehab Potential Good    PT Frequency 2x / week    PT Duration 8 weeks    PT Treatment/Interventions Joint Manipulations;Dry needling;Passive range of motion;Cryotherapy;Electrical Stimulation;Moist Heat;Ultrasound;Therapeutic activities;Functional mobility training;Therapeutic exercise;Patient/family education;Manual techniques    PT Next Visit Plan ROM    PT Home Exercise Plan Mebridge Access Code: [email or web address]    Consulted and Agree with Plan of Care Patient           Patient will benefit from skilled therapeutic intervention in order to improve the following deficits and impairments:  Decreased range of motion, Decreased strength, Impaired flexibility, Decreased safety awareness, Decreased mobility, Pain, Decreased activity tolerance  Visit Diagnosis: Muscle weakness (generalized)  Chronic right shoulder pain  Stiffness of right shoulder, not elsewhere classified     Problem List Patient Active Problem List   Diagnosis Date Noted   Arthritis of left shoulder region 05/08/2019   Abnormal glucose 12/05/2018   Essential hypertension, benign 09/15/2018   Dyslipidemia 09/15/2018   Abnormal stress test 07/20/2018   Coronary artery disease involving native coronary artery of native heart 07/20/2018   CKD (chronic kidney disease) stage 3, GFR 30-59 ml/min 07/13/2018   Erectile dysfunction 07/13/2018   Insomnia 07/13/2018   Hypogonadism in male 06/18/2018    Testicular cancer ([place]) 06/13/2018   Hyperlipidemia 06/13/2018   Depression 06/13/2018   Anxiety 06/13/2018   Callus of foot 03/27/2018   Pes planovalgus, acquired, right 01/30/2018   Arthritis of midfoot 01/30/2018   Achilles tendon contracture, right 01/30/2018   Left shoulder pain 07/26/2017   Infection of prosthetic left knee joint ([place]) 07/10/2017   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[place]) 05/15/2017   GERD (gastroesophageal reflux disease) 03/20/2017   Primary osteoarthritis of one knee, right 12/30/2016   H/O total knee replacement, left 12/30/2016   Moderate recurrent major depression ([place]) 08/15/2016   Chest pain due to myocardial ischemia 08/14/2016   Chest pain, rule out acute myocardial infarction 08/14/2016   Chest x-ray abnormality 10/17/2013   Scoliosis 07/11/2012   Arthritis of wrist, left, degenerative 07/11/2012   Parkinson's disease ([place]) 01/07/2012   Phillips Grout PT, DPT, GCS  Makaiya Geerdes 05/06/2020, 4:34 PM  [place] [facility] MAIN [facility] SERVICES [address] [place], [place], [address] Phone: [phone]   Fax:  [phone]  Name: JARMAINE EHRLER MRN: [id number] Date of Birth: [birth year]

## 2020-05-13 ENCOUNTER — Other Ambulatory Visit: Payer: Self-pay

## 2020-05-13 ENCOUNTER — Ambulatory Visit: Payer: Medicare Other | Attending: Orthopedic Surgery

## 2020-05-13 DIAGNOSIS — G8929 Other chronic pain: Secondary | ICD-10-CM | POA: Insufficient documentation

## 2020-05-13 DIAGNOSIS — M25511 Pain in right shoulder: Secondary | ICD-10-CM | POA: Insufficient documentation

## 2020-05-13 DIAGNOSIS — R2681 Unsteadiness on feet: Secondary | ICD-10-CM | POA: Insufficient documentation

## 2020-05-13 DIAGNOSIS — M25611 Stiffness of right shoulder, not elsewhere classified: Secondary | ICD-10-CM | POA: Insufficient documentation

## 2020-05-13 DIAGNOSIS — M6281 Muscle weakness (generalized): Secondary | ICD-10-CM | POA: Diagnosis present

## 2020-05-13 NOTE — Therapy (Signed)
[place] MAIN [facility] SERVICES [address] [place], [place], [address] Phone: [phone]   Fax:  [phone]  Physical Therapy Treatment  Patient Details  Name: Charles Choi MRN: [id number] Date of Birth: 1951/08/12 Referring Provider (PT): gage, mark   Encounter Date: 05/13/2020   PT End of Session - 05/13/20 1410    Visit Number 4    Number of Visits 17    Date for PT Re-Evaluation 06/24/20    PT Start Time 0277    PT Stop Time 1439    PT Time Calculation (min) 40 min    Activity Tolerance Patient tolerated treatment well    Behavior During Therapy [facility] for tasks assessed/performed           Past Medical History:  Diagnosis Date  . Anxiety   . Cancer ([place])   . Depression   . Hypertension   . Parkinson's disease [facility])     Past Surgical History:  Procedure Laterality Date  . BACK SURGERY    . CHOLECYSTECTOMY    . DEEP BRAIN STIMULATOR PLACEMENT     for Parkinson's disease  . JOINT REPLACEMENT     left knee x2  . ORCHIECTOMY  1983    There were no vitals filed for this visit.   Subjective Assessment - 05/13/20 1405    Subjective Pt doing well this date, reports no updates. Pain in shoulder is minimal, mostly with movement. Pt has been watching [place].    Pertinent History Patient had rTSA 08/27/19, Patient sustained 2 dislocations (Dec '20, Jan '20). In February, pt underwent Rt rTSA. Pt worked with [place] after surgery, then transitioned to OPPT for resuming rehab.    Currently in Pain? No/denies           INTERVENTION:  -seated BUE flexion table slides x3 minutes (3-5sec holds);  -seated RUE abduction table slides x2 minutes (3-5sec holds); approaching 85 degrees  -seated RUE external rotation table slides x2 minutes (3-5secH); approaching 70 degrees -short arc Rt shoulder flexion (straight sagittal place) hand to crown of head (AA/ROM for tactile cues) 1x20  -RUE biceps curl, 2lb, authors assists c  maintaining in sagittal plane 1x15  -seated RUE GHJ ER 1x15 AA/ROM (difficult to detect activation)  -seated RUE GHJ IR mild reistance 1x15 (neutral to belly, elbow at 90)  -seated LUE abduction to 90 (short lever) 1x15      PT Short Term Goals - 04/30/20 1123      PT SHORT TERM GOAL #1   Title Patient will be independent in home exercise program to improve strength/mobility for better functional independence with ADLs.    Baseline Patient is not able to reach behind his back to wash it.    Time 4    Period Weeks    Status New    Target Date 05/28/20      PT SHORT TERM GOAL #2   Title Patient will increase BLE gross strength to 4+/5 as to improve functional strength for independent gait, increased standing tolerance and increased ADL ability.    Baseline 04/29/20= -3/5 strength R shoulder flex/abd/ER/IR    Time 4    Status New    Target Date 05/28/20             PT Long Term Goals - 04/30/20 1128      PT LONG TERM GOAL #1   Title Patient will report a worst pain of 3/10 on VAS in R shoulder to  improve tolerance with ADLs and reduced symptoms with activities.    Baseline 04/29/20= 4/10 right shoulder    Time 8    Period Weeks    Status New    Target Date 06/25/20      PT LONG TERM GOAL #2   Title Patient will improve shoulder AROM to > 135 degrees of flexion, scaption, and abduction for improved ability to perform overhead activities, ER30 active, IR functionally to L 1.    Time 8    Period Weeks    Status New    Target Date 06/25/20      PT LONG TERM GOAL #3   Title Patient will improve shoulder elevation and ER in scapular plane  PROM to > 140 degrees of flexion, ER 40 deg passively    Baseline 04/29/20= see evaluation    Time 8    Period Weeks    Status New    Target Date 06/25/20      PT LONG TERM GOAL #4   Title Patient will improve his quick dash score and FOTO score to indicate functional improvements    Baseline quick dash score = 70.45    Time 8     Period Weeks    Status New    Target Date 06/25/20                 Plan - 05/13/20 1411    Clinical Impression Statement Pt able to complete entire session as planned with rest breaks provided as needed. Continued to progress ROM and basic strengthening. ABDCT ROM remains mot limitd, however ER ROM continues to improved. Weakness in rotation makes maintaining rotational control extremely difficult when performing elbow exercise in the sagittal plane.fPt maintains high level of focus and motivation. Extensive verbal, visual, and tactile cues are provided for most accurate form possible. Author provides min-maxA intermittently for full permitted or available ROM as needed. Overall pt continues to make steady progress toward treatment goals.    Personal Factors and Comorbidities Age    Examination-Activity Limitations Bed Mobility;Locomotion Level;Reach Overhead;Bathing;Carry;Lift;Stand    Examination-Participation Restrictions Community Activity;Cleaning;Church;Driving;Meal Prep;Yard Work    Stability/Clinical Decision Making Stable/Uncomplicated    Clinical Decision Making Moderate    Rehab Potential Good    PT Frequency 2x / week    PT Duration 8 weeks    PT Treatment/Interventions Joint Manipulations;Dry needling;Passive range of motion;Cryotherapy;Electrical Stimulation;Moist Heat;Ultrasound;Therapeutic activities;Functional mobility training;Therapeutic exercise;Patient/family education;Manual techniques    PT Next Visit Plan ROM    PT Home Exercise Plan Mebridge Access Code: [email or web address]    Consulted and Agree with Plan of Care Patient           Patient will benefit from skilled therapeutic intervention in order to improve the following deficits and impairments:  Decreased range of motion, Decreased strength, Impaired flexibility, Decreased safety awareness, Decreased mobility, Pain, Decreased activity tolerance  Visit Diagnosis: Muscle weakness (generalized)  Chronic right  shoulder pain  Stiffness of right shoulder, not elsewhere classified  Unsteadiness on feet     Problem List Patient Active Problem List   Diagnosis Date Noted  . Arthritis of left shoulder region 05/08/2019  . Abnormal glucose 12/05/2018  . Essential hypertension, benign 09/15/2018  . Dyslipidemia 09/15/2018  . Abnormal stress test 07/20/2018  . Coronary artery disease involving native coronary artery of native heart 07/20/2018  . CKD (chronic kidney disease) stage 3, GFR 30-59 ml/min 07/13/2018  . Erectile dysfunction 07/13/2018  . Insomnia 07/13/2018  . Hypogonadism in male 06/18/2018  .  Testicular cancer ([place]) 06/13/2018  . Hyperlipidemia 06/13/2018  . Depression 06/13/2018  . Anxiety 06/13/2018  . Callus of foot 03/27/2018  . Pes planovalgus, acquired, right 01/30/2018  . Arthritis of midfoot 01/30/2018  . Achilles tendon contracture, right 01/30/2018  . Left shoulder pain 07/26/2017  . Infection of prosthetic left knee joint ([place]) 07/10/2017  . 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 GERD (gastroesophageal reflux disease) 03/20/2017  . Primary osteoarthritis of one knee, right 12/30/2016  . H/O total knee replacement, left 12/30/2016  . Moderate recurrent major depression ([place]) 08/15/2016  . Chest pain due to myocardial ischemia 08/14/2016  . Chest pain, rule out acute myocardial infarction 08/14/2016  . Chest x-ray abnormality 10/17/2013  . Scoliosis 07/11/2012  . Arthritis of wrist, left, degenerative 07/11/2012  . Parkinson's disease ([place]) 01/07/2012   2:56 PM, 05/13/20 Etta Grandchild, PT, DPT Physical Therapist - [place] [phone]     Etta Grandchild 05/13/2020, 2:15 PM  [place] MAIN [facility] SERVICES [address] [place], [place], [address] Phone: [phone]   Fax:  [phone]  Name: Charles Choi MRN:  [id number] Date of Birth: Jul 24, 1951

## 2020-05-18 ENCOUNTER — Other Ambulatory Visit: Payer: Self-pay

## 2020-05-18 ENCOUNTER — Ambulatory Visit: Payer: Medicare Other

## 2020-05-18 DIAGNOSIS — G8929 Other chronic pain: Secondary | ICD-10-CM

## 2020-05-18 DIAGNOSIS — M6281 Muscle weakness (generalized): Secondary | ICD-10-CM | POA: Diagnosis not present

## 2020-05-18 DIAGNOSIS — M25611 Stiffness of right shoulder, not elsewhere classified: Secondary | ICD-10-CM

## 2020-05-18 NOTE — Therapy (Signed)
[place] MAIN [facility] SERVICES [address] [place], [place], [address] Phone: [phone]   Fax:  [phone]  Physical Therapy Treatment  Patient Details  Name: Charles Choi MRN: [id number] Date of Birth: 05-13-1951 Referring Provider (PT): gage, mark   Encounter Date: 05/18/2020   PT End of Session - 05/18/20 1438    Visit Number 5    Number of Visits 17    Date for PT Re-Evaluation 06/24/20    PT Start Time 0881    PT Stop Time 1515    PT Time Calculation (min) 40 min    Activity Tolerance Patient tolerated treatment well    Behavior During Therapy [facility] for tasks assessed/performed           Past Medical History:  Diagnosis Date  . Anxiety   . Cancer ([place])   . Depression   . Hypertension   . Parkinson's disease [facility])     Past Surgical History:  Procedure Laterality Date  . BACK SURGERY    . CHOLECYSTECTOMY    . DEEP BRAIN STIMULATOR PLACEMENT     for Parkinson's disease  . JOINT REPLACEMENT     left knee x2  . ORCHIECTOMY  1983    There were no vitals filed for this visit.   Subjective Assessment - 05/18/20 1437    Subjective Pt doing well this date, reports no updates. No pain in shoulder at rest however he does have some pain with movement. Pt notices that he is slowly able to do more movement with his RUE.    Pertinent History Patient had rTSA 08/27/19, Patient sustained 2 dislocations (Dec '20, Jan '20). In February, pt underwent Rt rTSA. Pt worked with [place] after surgery, then transitioned to OPPT for resuming rehab.    Currently in Pain? No/denies             TREATMENT   Manual Therapy  PROM R shoulder flexion and abduction within comfortable range and never pushing through resistance x multiple bouts each, very mild end range pain reported; PROM R shoulder in scapular plane into ER to 40 degrees, no pain and no resistance encountered;   Ther-ex  Seated BUE flexion table slides x 3 minutes (3-5sec  holds);  Seated RUE external rotation table slides x 2 minutes (3-5sec hold); Seated short lever R shoulder flexion (straight sagittal place) hand to crown of head 2 x 10, therapist provides assist to keep shoulder in sagittal plane as he lacks ER strength and shoulder wants to move into IR; Seated short lever R shoulder abduction in scapular plane 2 x 10, therapist provides assist to keep shoulder in neutral rotation as he lacks ER strength and shoulder wants to move into IR; Seated BUE biceps curl 1#, authors assists c maintaining in sagittal plane x 10;     Pt educated throughout session about proper posture and technique with exercises. Improved exercise technique, movement at target joints, use of target muscles after min to mod verbal, visual, tactile cues.    Pt demonstrates excellent motivation today during session. Progressed to sitting R shoulder AROM short lever flexion and abduction. Also continue to work on table slides for flexion and ER. Pt encouraged to continue HEP and follow-up as scheduled. He will benefit from PT services to address deficits in R shoulder strength in order to improve function at home.  PT Short Term Goals - 04/30/20 1123      PT SHORT TERM GOAL #1   Title Patient will be independent in home exercise program to improve strength/mobility for better functional independence with ADLs.    Baseline Patient is not able to reach behind his back to wash it.    Time 4    Period Weeks    Status New    Target Date 05/28/20      PT SHORT TERM GOAL #2   Title Patient will increase BLE gross strength to 4+/5 as to improve functional strength for independent gait, increased standing tolerance and increased ADL ability.    Baseline 04/29/20= -3/5 strength R shoulder flex/abd/ER/IR    Time 4    Status New    Target Date 05/28/20             PT Long Term Goals - 04/30/20 1128      PT LONG TERM GOAL #1   Title  Patient will report a worst pain of 3/10 on VAS in R shoulder to improve tolerance with ADLs and reduced symptoms with activities.    Baseline 04/29/20= 4/10 right shoulder    Time 8    Period Weeks    Status New    Target Date 06/25/20      PT LONG TERM GOAL #2   Title Patient will improve shoulder AROM to > 135 degrees of flexion, scaption, and abduction for improved ability to perform overhead activities, ER30 active, IR functionally to L 1.    Time 8    Period Weeks    Status New    Target Date 06/25/20      PT LONG TERM GOAL #3   Title Patient will improve shoulder elevation and ER in scapular plane  PROM to > 140 degrees of flexion, ER 40 deg passively    Baseline 04/29/20= see evaluation    Time 8    Period Weeks    Status New    Target Date 06/25/20      PT LONG TERM GOAL #4   Title Patient will improve his quick dash score and FOTO score to indicate functional improvements    Baseline quick dash score = 70.45    Time 8    Period Weeks    Status New    Target Date 06/25/20                 Plan - 05/18/20 1439    Clinical Impression Statement Pt demonstrates excellent motivation today during session. Progressed to sitting R shoulder AROM short lever flexion and abduction. Also continue to work on table slides for flexion and ER. Pt encouraged to continue HEP and follow-up as scheduled. He will benefit from PT services to address deficits in R shoulder strength in order to improve function at home.    Personal Factors and Comorbidities Age    Examination-Activity Limitations Bed Mobility;Locomotion Level;Reach Overhead;Bathing;Carry;Lift;Stand    Examination-Participation Restrictions Community Activity;Cleaning;Church;Driving;Meal Prep;Yard Work    Stability/Clinical Decision Making Stable/Uncomplicated    Rehab Potential Good    PT Frequency 2x / week    PT Duration 8 weeks    PT Treatment/Interventions Joint Manipulations;Dry needling;Passive range of  motion;Cryotherapy;Electrical Stimulation;Moist Heat;Ultrasound;Therapeutic activities;Functional mobility training;Therapeutic exercise;Patient/family education;Manual techniques    PT Next Visit Plan ROM    PT Home Exercise Plan Mebridge Access Code: [email or web address]    Consulted and Agree with Plan of Care Patient  Patient will benefit from skilled therapeutic intervention in order to improve the following deficits and impairments:  Decreased range of motion, Decreased strength, Impaired flexibility, Decreased safety awareness, Decreased mobility, Pain, Decreased activity tolerance  Visit Diagnosis: Muscle weakness (generalized)  Chronic right shoulder pain  Stiffness of right shoulder, not elsewhere classified     Problem List Patient Active Problem List   Diagnosis Date Noted  . Arthritis of left shoulder region 05/08/2019  . Abnormal glucose 12/05/2018  . Essential hypertension, benign 09/15/2018  . Dyslipidemia 09/15/2018  . Abnormal stress test 07/20/2018  . Coronary artery disease involving native coronary artery of native heart 07/20/2018  . CKD (chronic kidney disease) stage 3, GFR 30-59 ml/min 07/13/2018  . Erectile dysfunction 07/13/2018  . Insomnia 07/13/2018  . Hypogonadism in male 06/18/2018  . Testicular cancer ([place]) 06/13/2018  . Hyperlipidemia 06/13/2018  . Depression 06/13/2018  . Anxiety 06/13/2018  . Callus of foot 03/27/2018  . Pes planovalgus, acquired, right 01/30/2018  . Arthritis of midfoot 01/30/2018  . Achilles tendon contracture, right 01/30/2018  . Left shoulder pain 07/26/2017  . Infection of prosthetic left knee joint ([place]) 07/10/2017  . 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[place]) 05/15/2017  . GERD (gastroesophageal reflux disease) 03/20/2017  . Primary osteoarthritis of one knee, right 12/30/2016  . H/O total knee replacement, left 12/30/2016  . Moderate recurrent major depression ([place]) 08/15/2016  . Chest pain due to myocardial  ischemia 08/14/2016  . Chest pain, rule out acute myocardial infarction 08/14/2016  . Chest x-ray abnormality 10/17/2013  . Scoliosis 07/11/2012  . Arthritis of wrist, left, degenerative 07/11/2012  . Parkinson's disease ([place]) 01/07/2012     Phillips Grout PT, DPT, GCS  Mychele Seyller 05/18/2020, 3:40 PM  [place] MAIN [facility] SERVICES [address] [place], [place], [address] Phone: [phone]   Fax:  [phone]  Name: Charles Choi MRN: [id number] Date of Birth: 1951/02/04

## 2020-05-20 ENCOUNTER — Other Ambulatory Visit: Payer: Self-pay

## 2020-05-20 ENCOUNTER — Other Ambulatory Visit: Payer: Medicare Other

## 2020-05-20 ENCOUNTER — Ambulatory Visit: Payer: Medicare Other

## 2020-05-20 DIAGNOSIS — E291 Testicular hypofunction: Secondary | ICD-10-CM

## 2020-05-20 DIAGNOSIS — M25511 Pain in right shoulder: Secondary | ICD-10-CM

## 2020-05-20 DIAGNOSIS — G8929 Other chronic pain: Secondary | ICD-10-CM

## 2020-05-20 DIAGNOSIS — M6281 Muscle weakness (generalized): Secondary | ICD-10-CM

## 2020-05-20 NOTE — Therapy (Signed)
[place] MAIN [facility] SERVICES [address] [place], [place], [address] Phone: [phone]   Fax:  [phone]  Physical Therapy Treatment  Patient Details  Name: Charles Choi MRN: [id number] Date of Birth: 01-Apr-1951 Referring Provider (PT): gage, mark   Encounter Date: 05/20/2020   PT End of Session - 05/20/20 1502    Visit Number 6    Number of Visits 17    Date for PT Re-Evaluation 06/24/20    PT Start Time 7681    PT Stop Time 1515    PT Time Calculation (min) 40 min    Activity Tolerance Patient tolerated treatment well    Behavior During Therapy [facility] for tasks assessed/performed           Past Medical History:  Diagnosis Date  . Anxiety   . Cancer ([place])   . Depression   . Hypertension   . Parkinson's disease [facility])     Past Surgical History:  Procedure Laterality Date  . BACK SURGERY    . CHOLECYSTECTOMY    . DEEP BRAIN STIMULATOR PLACEMENT     for Parkinson's disease  . JOINT REPLACEMENT     left knee x2  . ORCHIECTOMY  1983    There were no vitals filed for this visit.   Subjective Assessment - 05/20/20 1456    Subjective Pt doing well this date, reports no updates. Patient notes shoulder is sore today from surgery and not muscular soreness. No pain in shoulder at rest however he does have some pain with movement. Pt notices that he is slowly able to do more movement with his RUE. No other questions or concerns.    Pertinent History Patient had rTSA 08/27/19, Patient sustained 2 dislocations (Dec '20, Jan '20). In February, pt underwent Rt rTSA. Pt worked with [place] after surgery, then transitioned to OPPT for resuming rehab.    Currently in Pain? No/denies              TREATMENT   Manual Therapy  PROM R shoulder flexion and abduction within comfortable range and never pushing through resistance x multiple bouts each, very mild end range pain reported; PROM R shoulder in scapular plane into ER to 40 degrees,  no pain and no resistance encountered;     Ther-ex  Seated RUE flexion table slides x 3 minutes (3-5sec holds);  Seated RUE abduction table slides x 3 minutes (3-5sec holds); cues needed for proper positioning Seated RUE external rotation table slides x 3 minutes (3-5sec hold); cues needed for proper positioning Seated short lever R shoulder flexion (straight sagittal place) hand to crown of head 2 x 10, therapist provides assist to keep shoulder in sagittal plane as he lacks ER strength and shoulder wants to move into IR; Seated short lever R shoulder abduction in scapular plane 2 x 10, therapist provides assist to keep shoulder in neutral rotation as he lacks ER strength and shoulder wants to move into IR; Seated BUE biceps curl 1#, assists c maintaining in sagittal plane 2 x 15;     Pt educated throughout session about proper posture and technique with exercises. Improved exercise technique, movement at target joints, use of target muscles after min to mod verbal, visual, tactile cues.      Pt demonstrates excellent motivation today during session. Patient increased reps for bicep curls, showing increased strength and endurance. Table abduction slides added to work on shoulder mobility. Pt encouraged to continue HEP and follow-up as scheduled. He  will benefit from PT services to address deficits in R shoulder strength in order to improve function at home.        PT Short Term Goals - 04/30/20 1123      PT SHORT TERM GOAL #1   Title Patient will be independent in home exercise program to improve strength/mobility for better functional independence with ADLs.    Baseline Patient is not able to reach behind his back to wash it.    Time 4    Period Weeks    Status New    Target Date 05/28/20      PT SHORT TERM GOAL #2   Title Patient will increase BLE gross strength to 4+/5 as to improve functional strength for independent gait, increased standing tolerance and increased ADL ability.     Baseline 04/29/20= -3/5 strength R shoulder flex/abd/ER/IR    Time 4    Status New    Target Date 05/28/20             PT Long Term Goals - 04/30/20 1128      PT LONG TERM GOAL #1   Title Patient will report a worst pain of 3/10 on VAS in R shoulder to improve tolerance with ADLs and reduced symptoms with activities.    Baseline 04/29/20= 4/10 right shoulder    Time 8    Period Weeks    Status New    Target Date 06/25/20      PT LONG TERM GOAL #2   Title Patient will improve shoulder AROM to > 135 degrees of flexion, scaption, and abduction for improved ability to perform overhead activities, ER30 active, IR functionally to L 1.    Time 8    Period Weeks    Status New    Target Date 06/25/20      PT LONG TERM GOAL #3   Title Patient will improve shoulder elevation and ER in scapular plane  PROM to > 140 degrees of flexion, ER 40 deg passively    Baseline 04/29/20= see evaluation    Time 8    Period Weeks    Status New    Target Date 06/25/20      PT LONG TERM GOAL #4   Title Patient will improve his quick dash score and FOTO score to indicate functional improvements    Baseline quick dash score = 70.45    Time 8    Period Weeks    Status New    Target Date 06/25/20                 Plan - 05/20/20 1502    Clinical Impression Statement Pt demonstrates excellent motivation today during session. Patient increased reps for bicep curls, showing increased strength and endurance. Table abduction slides added to work on shoulder mobility. Pt encouraged to continue HEP and follow-up as scheduled. He will benefit from PT services to address deficits in R shoulder strength in order to improve function at home.    Personal Factors and Comorbidities Age    Examination-Activity Limitations Bed Mobility;Locomotion Level;Reach Overhead;Bathing;Carry;Lift;Stand    Examination-Participation Restrictions Community Activity;Cleaning;Church;Driving;Meal Prep;Yard Work     Stability/Clinical Decision Making Stable/Uncomplicated    Rehab Potential Good    PT Frequency 2x / week    PT Duration 8 weeks    PT Treatment/Interventions Joint Manipulations;Dry needling;Passive range of motion;Cryotherapy;Electrical Stimulation;Moist Heat;Ultrasound;Therapeutic activities;Functional mobility training;Therapeutic exercise;Patient/family education;Manual techniques    PT Next Visit Plan ROM    PT Home Exercise Plan  Mebridge Access Code: [email or web address]    Consulted and Agree with Plan of Care Patient           Patient will benefit from skilled therapeutic intervention in order to improve the following deficits and impairments:  Decreased range of motion, Decreased strength, Impaired flexibility, Decreased safety awareness, Decreased mobility, Pain, Decreased activity tolerance  Visit Diagnosis: Muscle weakness (generalized)  Chronic right shoulder pain     Problem List Patient Active Problem List   Diagnosis Date Noted  . Arthritis of left shoulder region 05/08/2019  . Abnormal glucose 12/05/2018  . Essential hypertension, benign 09/15/2018  . Dyslipidemia 09/15/2018  . Abnormal stress test 07/20/2018  . Coronary artery disease involving native coronary artery of native heart 07/20/2018  . CKD (chronic kidney disease) stage 3, GFR 30-59 ml/min 07/13/2018  . Erectile dysfunction 07/13/2018  . Insomnia 07/13/2018  . Hypogonadism in male 06/18/2018  . Testicular cancer ([place]) 06/13/2018  . Hyperlipidemia 06/13/2018  . Depression 06/13/2018  . Anxiety 06/13/2018  . Callus of foot 03/27/2018  . Pes planovalgus, acquired, right 01/30/2018  . Arthritis of midfoot 01/30/2018  . Achilles tendon contracture, right 01/30/2018  . Left shoulder pain 07/26/2017  . Infection of prosthetic left knee joint ([place]) 07/10/2017  . 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[place]) 05/15/2017  . GERD (gastroesophageal reflux disease) 03/20/2017  . Primary osteoarthritis of one knee, right  12/30/2016  . H/O total knee replacement, left 12/30/2016  . Moderate recurrent major depression ([place]) 08/15/2016  . Chest pain due to myocardial ischemia 08/14/2016  . Chest pain, rule out acute myocardial infarction 08/14/2016  . Chest x-ray abnormality 10/17/2013  . Scoliosis 07/11/2012  . Arthritis of wrist, left, degenerative 07/11/2012  . Parkinson's disease ([place]) 01/07/2012    This entire session was performed under direct supervision and direction of a licensed therapist/therapist assistant . I have personally read, edited and approve of the note as written.   Noemi Chapel, SPT Phillips Grout PT, DPT, GCS  Huprich,Jason 05/20/2020, 4:31 PM  [place] MAIN [facility] SERVICES [address] [place], [place], [address] Phone: [phone]   Fax:  [phone]  Name: Charles Choi MRN: [id number] Date of Birth: 07/25/1951

## 2020-05-21 LAB — TESTOSTERONE: Testosterone: 295 ng/dL (ref 264–916)

## 2020-05-25 ENCOUNTER — Other Ambulatory Visit: Payer: Self-pay

## 2020-05-25 ENCOUNTER — Ambulatory Visit: Payer: Medicare Other

## 2020-05-25 DIAGNOSIS — G8929 Other chronic pain: Secondary | ICD-10-CM

## 2020-05-25 DIAGNOSIS — M25611 Stiffness of right shoulder, not elsewhere classified: Secondary | ICD-10-CM

## 2020-05-25 DIAGNOSIS — M6281 Muscle weakness (generalized): Secondary | ICD-10-CM

## 2020-05-25 DIAGNOSIS — M25511 Pain in right shoulder: Secondary | ICD-10-CM

## 2020-05-25 NOTE — Therapy (Signed)
[place] MAIN [facility] SERVICES [address] [place], [place], [address] Phone: [phone]   Fax:  [phone]  Physical Therapy Treatment  Patient Details  Name: Charles Choi MRN: [id number] Date of Birth: [birth year] Referring Provider (PT): gage, mark   Encounter Date: 05/25/2020   PT End of Session - 05/25/20 1510    Visit Number 7    Number of Visits 17    Date for PT Re-Evaluation 06/24/20    PT Start Time 9323    PT Stop Time 1515    PT Time Calculation (min) 44 min    Activity Tolerance Patient tolerated treatment well    Behavior During Therapy [facility] for tasks assessed/performed           Past Medical History:  Diagnosis Date   Anxiety    Cancer ([place])    Depression    Hypertension    Parkinson's disease ([place])     Past Surgical History:  Procedure Laterality Date   BACK [place]     for Parkinson's disease   JOINT REPLACEMENT     left knee x2   ORCHIECTOMY  1983    There were no vitals filed for this visit.   Subjective Assessment - 05/25/20 1432    Subjective Pt doing well this date. Patient had blood work done today and will receive results in a few days. Patient notes shoulder is a little sore today from surgery and not muscular soreness. No other questions or concerns.    Pertinent History Patient had rTSA 08/27/19, Patient sustained 2 dislocations (Dec 20, Jan 20). In February, pt underwent Rt rTSA. Pt worked with [place] after surgery, then transitioned to OPPT for resuming rehab.    Currently in Pain? No/denies              TREATMENT    Manual Therapy  PROM R shoulder flexion and abduction within comfortable range and never pushing through resistance x multiple bouts each, very mild end range pain reported; PROM R shoulder in scapular plane into ER to 40 degrees, no pain and no resistance encountered;     Ther-ex  Seated RUE flexion  table slides 2 x 10 reps (3-5sec holds);  Seated RUE abduction table slides 2 x 10 reps (3-5sec holds); cues needed for proper positioning Seated RUE external rotation table slides 2 x 10 reps (3-5sec hold); cues needed for proper positioning Seated long lever R shoulder flexion (straight sagittal place) 2 x 8, cued for proper technique; Seated short lever R shoulder abduction in scapular plane 2 x 10, tried long lever but painful. Seated BUE biceps curl 1#, 2 x 15;   Scapular retractions x 10, cued for proper posture and technique; Seated shoulder extensions with GTB 2 x 10, cued for proper technique, making sure arm does not past midline of body.   Pt educated throughout session about proper posture and technique with exercises. Improved exercise technique, movement at target joints, use of target muscles after min to mod verbal, visual, tactile cues.    Pt demonstrates excellent motivation today during session. Patient is able to get more motion as end range is further. Patient able to straight arm for R shoulder flexion showing increased strength. Initiated exercises to strengthen back and scapular musculature. He is encouraged to continue HEP and follow-up as scheduled. He will benefit from PT services to address deficits in R shoulder strength in  order to improve function at home.            PT Short Term Goals - 04/30/20 1123      PT SHORT TERM GOAL #1   Title Patient will be independent in home exercise program to improve strength/mobility for better functional independence with ADLs.    Baseline Patient is not able to reach behind his back to wash it.    Time 4    Period Weeks    Status New    Target Date 05/28/20      PT SHORT TERM GOAL #2   Title Patient will increase BLE gross strength to 4+/5 as to improve functional strength for independent gait, increased standing tolerance and increased ADL ability.    Baseline 04/29/20= -3/5 strength R shoulder flex/abd/ER/IR    Time  4    Status New    Target Date 05/28/20             PT Long Term Goals - 04/30/20 1128      PT LONG TERM GOAL #1   Title Patient will report a worst pain of 3/10 on VAS in R shoulder to improve tolerance with ADLs and reduced symptoms with activities.    Baseline 04/29/20= 4/10 right shoulder    Time 8    Period Weeks    Status New    Target Date 06/25/20      PT LONG TERM GOAL #2   Title Patient will improve shoulder AROM to > 135 degrees of flexion, scaption, and abduction for improved ability to perform overhead activities, ER30 active, IR functionally to L 1.    Time 8    Period Weeks    Status New    Target Date 06/25/20      PT LONG TERM GOAL #3   Title Patient will improve shoulder elevation and ER in scapular plane  PROM to > 140 degrees of flexion, ER 40 deg passively    Baseline 04/29/20= see evaluation    Time 8    Period Weeks    Status New    Target Date 06/25/20      PT LONG TERM GOAL #4   Title Patient will improve his quick dash score and FOTO score to indicate functional improvements    Baseline quick dash score = 70.45    Time 8    Period Weeks    Status New    Target Date 06/25/20                 Plan - 05/25/20 1511    Clinical Impression Statement Pt demonstrates excellent motivation today during session. Patient is able to get more motion as end range is further. Patient able to straighten arm for R shoulder flexion showing increased strength. Initiated exercises to strengthen back and scapular musculature. He is encouraged to continue HEP and follow-up as scheduled. He will benefit from PT services to address deficits in R shoulder strength in order to improve function at home.    Personal Factors and Comorbidities Age    Examination-Activity Limitations Bed Mobility;Locomotion Level;Reach Overhead;Bathing;Carry;Lift;Stand    Examination-Participation Restrictions Community Activity;Cleaning;Church;Driving;Meal Prep;Yard Work     Stability/Clinical Decision Making Stable/Uncomplicated    Rehab Potential Good    PT Frequency 2x / week    PT Duration 8 weeks    PT Treatment/Interventions Joint Manipulations;Dry needling;Passive range of motion;Cryotherapy;Electrical Stimulation;Moist Heat;Ultrasound;Therapeutic activities;Functional mobility training;Therapeutic exercise;Patient/family education;Manual techniques    PT Next Visit Plan ROM  PT Home Exercise Plan Mebridge Access Code: [email or web address]    Consulted and Agree with Plan of Care Patient           Patient will benefit from skilled therapeutic intervention in order to improve the following deficits and impairments:  Decreased range of motion, Decreased strength, Impaired flexibility, Decreased safety awareness, Decreased mobility, Pain, Decreased activity tolerance  Visit Diagnosis: Muscle weakness (generalized)  Chronic right shoulder pain  Stiffness of right shoulder, not elsewhere classified     Problem List Patient Active Problem List   Diagnosis Date Noted   Arthritis of left shoulder region 05/08/2019   Abnormal glucose 12/05/2018   Essential hypertension, benign 09/15/2018   Dyslipidemia 09/15/2018   Abnormal stress test 07/20/2018   Coronary artery disease involving native coronary artery of native heart 07/20/2018   CKD (chronic kidney disease) stage 3, GFR 30-59 ml/min 07/13/2018   Erectile dysfunction 07/13/2018   Insomnia 07/13/2018   Hypogonadism in male 06/18/2018   Testicular cancer ([place]) 06/13/2018   Hyperlipidemia 06/13/2018   Depression 06/13/2018   Anxiety 06/13/2018   Callus of foot 03/27/2018   Pes planovalgus, acquired, right 01/30/2018   Arthritis of midfoot 01/30/2018   Achilles tendon contracture, right 01/30/2018   Left shoulder pain 07/26/2017   Infection of prosthetic left knee joint ([place]) 07/10/2017   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[place]) 05/15/2017   GERD (gastroesophageal reflux disease)  03/20/2017   Primary osteoarthritis of one knee, right 12/30/2016   H/O total knee replacement, left 12/30/2016   Moderate recurrent major depression ([place]) 08/15/2016   Chest pain due to myocardial ischemia 08/14/2016   Chest pain, rule out acute myocardial infarction 08/14/2016   Chest x-ray abnormality 10/17/2013   Scoliosis 07/11/2012   Arthritis of wrist, left, degenerative 07/11/2012   Parkinson's disease ([place]) 01/07/2012     This entire session was performed under direct supervision and direction of a licensed therapist/therapist assistant . I have personally read, edited and approve of the note as written.    Noemi Chapel, SPT Phillips Grout PT, DPT, GCS  Huprich,Jason 05/25/2020, 3:41 PM  [place] MAIN [facility] SERVICES [address] [place], [place], [address] Phone: [phone]   Fax:  [phone]  Name: Charles Choi MRN: [id number] Date of Birth: Sep 29, 1951

## 2020-05-27 ENCOUNTER — Ambulatory Visit: Payer: Medicare Other

## 2020-05-27 ENCOUNTER — Other Ambulatory Visit: Payer: Self-pay

## 2020-05-27 DIAGNOSIS — M25511 Pain in right shoulder: Secondary | ICD-10-CM

## 2020-05-27 DIAGNOSIS — M6281 Muscle weakness (generalized): Secondary | ICD-10-CM | POA: Diagnosis not present

## 2020-05-27 DIAGNOSIS — G8929 Other chronic pain: Secondary | ICD-10-CM

## 2020-05-27 NOTE — Therapy (Signed)
[place] MAIN [facility] SERVICES [address] [place], [place], [address] Phone: [phone]   Fax:  [phone]  Physical Therapy Treatment  Patient Details  Name: Charles Choi MRN: [id number] Date of Birth: 1951-03-02 Referring Provider (PT): gage, mark   Encounter Date: 05/27/2020   PT End of Session - 05/27/20 1456    Visit Number 8    Number of Visits 17    Date for PT Re-Evaluation 06/24/20    PT Start Time 5643    PT Stop Time 1515    PT Time Calculation (min) 36 min    Activity Tolerance Patient tolerated treatment well    Behavior During Therapy [facility] for tasks assessed/performed           Past Medical History:  Diagnosis Date  . Anxiety   . Cancer ([place])   . Depression   . Hypertension   . Parkinson's disease [facility])     Past Surgical History:  Procedure Laterality Date  . BACK SURGERY    . CHOLECYSTECTOMY    . DEEP BRAIN STIMULATOR PLACEMENT     for Parkinson's disease  . JOINT REPLACEMENT     left knee x2  . ORCHIECTOMY  1983    There were no vitals filed for this visit.   Subjective Assessment - 05/27/20 1455    Subjective Pt doing well this date. Patient notes shoulder is a little sore today from surgery and not muscular soreness. No other questions or concerns.    Pertinent History Patient had rTSA 08/27/19, Patient sustained 2 dislocations (Dec '20, Jan '20). In February, pt underwent Rt rTSA. Pt worked with [place] after surgery, then transitioned to OPPT for resuming rehab.    Currently in Pain? No/denies             TREATMENT     Manual Therapy  PROM R shoulder flexion and abduction within comfortable range and never pushing through resistance x multiple bouts each, very mild end range pain reported; PROM R shoulder in scapular plane into ER to 40 degrees, no pain and no resistance encountered;     Ther-ex  Supine extension isometrics, towel behind arm 2 x 10 reps, holding for 3-5 secs; Supine  serratus punches 2 x 10, holding for 3-5 secs, cueing to keep arm straight; L sidelying R shoulder ER against gravity with towel under arm, cued for correct positioning x 10; Seated RUE flexion table slides 2 x 10 reps (3-5sec holds);  Seated RUE abduction table slides 2 x 10 reps (3-5sec holds); cues needed for proper positioning Seated RUE external rotation table slides 2 x 10 reps (3-5sec hold); cues needed for proper positioning Seated long lever R shoulder flexion (straight sagittal place) 2 x 8, cued for proper technique and to keep upper trap from coming up; Seated short lever R shoulder abduction in scapular plane 2 x 10, Seated BUE biceps curl 1#, 2 x 15;     Pt educated throughout session about proper posture and technique with exercises. Improved exercise technique, movement at target joints, use of target muscles after min to mod verbal, visual, tactile cues.     Pt demonstrates excellent motivation today during session. Patient arrived late, so session was abbreviated today. Continued exercises to strengthen back and postural muscles. Added more low level strengthening exercises to continue to activate shoulder musculature. Verbal and visual cues utilized to correct form. He is encouraged to continue HEP and follow-up as scheduled. He will benefit from PT  services to address deficits in R shoulder strength in order to improve function at home.               PT Short Term Goals - 04/30/20 1123      PT SHORT TERM GOAL #1   Title Patient will be independent in home exercise program to improve strength/mobility for better functional independence with ADLs.    Baseline Patient is not able to reach behind his back to wash it.    Time 4    Period Weeks    Status New    Target Date 05/28/20      PT SHORT TERM GOAL #2   Title Patient will increase BLE gross strength to 4+/5 as to improve functional strength for independent gait, increased standing tolerance and increased ADL  ability.    Baseline 04/29/20= -3/5 strength R shoulder flex/abd/ER/IR    Time 4    Status New    Target Date 05/28/20             PT Long Term Goals - 04/30/20 1128      PT LONG TERM GOAL #1   Title Patient will report a worst pain of 3/10 on VAS in R shoulder to improve tolerance with ADLs and reduced symptoms with activities.    Baseline 04/29/20= 4/10 right shoulder    Time 8    Period Weeks    Status New    Target Date 06/25/20      PT LONG TERM GOAL #2   Title Patient will improve shoulder AROM to > 135 degrees of flexion, scaption, and abduction for improved ability to perform overhead activities, ER30 active, IR functionally to L 1.    Time 8    Period Weeks    Status New    Target Date 06/25/20      PT LONG TERM GOAL #3   Title Patient will improve shoulder elevation and ER in scapular plane  PROM to > 140 degrees of flexion, ER 40 deg passively    Baseline 04/29/20= see evaluation    Time 8    Period Weeks    Status New    Target Date 06/25/20      PT LONG TERM GOAL #4   Title Patient will improve his quick dash score and FOTO score to indicate functional improvements    Baseline quick dash score = 70.45    Time 8    Period Weeks    Status New    Target Date 06/25/20                 Plan - 05/27/20 1459    Clinical Impression Statement Pt demonstrates excellent motivation today during session. Patient arrived late, so session was abbreviated today. Continued exercises to strengthen back and postural muscles. Added more low level strengthening exercises to continue to activate shoulder musculature. Verbal and visual cues utilized to correct form. He is encouraged to continue HEP and follow-up as scheduled. He will benefit from PT services to address deficits in R shoulder strength in order to improve function at home.    Personal Factors and Comorbidities Age    Examination-Activity Limitations Bed Mobility;Locomotion Level;Reach  Overhead;Bathing;Carry;Lift;Stand    Examination-Participation Restrictions Community Activity;Cleaning;Church;Driving;Meal Prep;Yard Work    Stability/Clinical Decision Making Stable/Uncomplicated    Rehab Potential Good    PT Frequency 2x / week    PT Duration 8 weeks    PT Treatment/Interventions Joint Manipulations;Dry needling;Passive range of motion;Cryotherapy;Electrical Stimulation;Moist Heat;Ultrasound;Therapeutic activities;Functional  mobility training;Therapeutic exercise;Patient/family education;Manual techniques    PT Next Visit Plan ROM    PT Home Exercise Plan Mebridge Access Code: [email or web address]    Consulted and Agree with Plan of Care Patient           Patient will benefit from skilled therapeutic intervention in order to improve the following deficits and impairments:  Decreased range of motion, Decreased strength, Impaired flexibility, Decreased safety awareness, Decreased mobility, Pain, Decreased activity tolerance  Visit Diagnosis: Muscle weakness (generalized)  Chronic right shoulder pain     Problem List Patient Active Problem List   Diagnosis Date Noted  . Arthritis of left shoulder region 05/08/2019  . Abnormal glucose 12/05/2018  . Essential hypertension, benign 09/15/2018  . Dyslipidemia 09/15/2018  . Abnormal stress test 07/20/2018  . Coronary artery disease involving native coronary artery of native heart 07/20/2018  . CKD (chronic kidney disease) stage 3, GFR 30-59 ml/min 07/13/2018  . Erectile dysfunction 07/13/2018  . Insomnia 07/13/2018  . Hypogonadism in male 06/18/2018  . Testicular cancer ([place]) 06/13/2018  . Hyperlipidemia 06/13/2018  . Depression 06/13/2018  . Anxiety 06/13/2018  . Callus of foot 03/27/2018  . Pes planovalgus, acquired, right 01/30/2018  . Arthritis of midfoot 01/30/2018  . Achilles tendon contracture, right 01/30/2018  . Left shoulder pain 07/26/2017  . Infection of prosthetic left knee joint ([place]) 07/10/2017  .  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 GERD (gastroesophageal reflux disease) 03/20/2017  . Primary osteoarthritis of one knee, right 12/30/2016  . H/O total knee replacement, left 12/30/2016  . Moderate recurrent major depression ([place]) 08/15/2016  . Chest pain due to myocardial ischemia 08/14/2016  . Chest pain, rule out acute myocardial infarction 08/14/2016  . Chest x-ray abnormality 10/17/2013  . Scoliosis 07/11/2012  . Arthritis of wrist, left, degenerative 07/11/2012  . Parkinson's disease ([place]) 01/07/2012    This entire session was performed under direct supervision and direction of a licensed therapist/therapist assistant . I have personally read, edited and approve of the note as written.   Noemi Chapel, SPT Phillips Grout PT, DPT, GCS  Huprich,Jason 05/28/2020, 10:13 AM  [place] MAIN [facility] SERVICES [address] [place], [place], [address] Phone: [phone]   Fax:  [phone]  Name: DAJION BICKFORD MRN: [id number] Date of Birth: 1950-12-22

## 2020-05-27 NOTE — Progress Notes (Signed)
05/28/2020 3:16 PM   Charles Choi 1951-05-20 [id number]  Referring provider: Idelle Crouch, MD [facility] [place],  [place] [address] Chief Complaint  Patient presents with  . Hypogonadism    HPI: Charles Choi is a 69 y.o. male with hypogonadism returns today for follow up.    -He is receiving injections in office 200 mg every 2 weeks.  -Midcycle testosterone level was 340 on 02/26/2020. Recent testosterone is 295 as of 05/20/2020.  -Last injection was May 2021 -No bothersome lower urinary tract symptoms or lower extremity edema. -No complaints today.  -Denies any stone episodes/renal colic. -He reports getting off track with his injections; last injection was 03/12/2020.   PMH: Past Medical History:  Diagnosis Date  . Anxiety   . Cancer ([place])   . Depression   . Hypertension   . Parkinson's disease [facility])     Surgical History: Past Surgical History:  Procedure Laterality Date  . BACK SURGERY    . CHOLECYSTECTOMY    . DEEP BRAIN STIMULATOR PLACEMENT     for Parkinson's disease  . JOINT REPLACEMENT     left knee x2  . ORCHIECTOMY  1983    Home Medications:  Allergies as of 05/28/2020      Reactions   Phenergan [promethazine Hcl] Other (See Comments)   Unknown    Reglan [metoclopramide] Other (See Comments)   Pass out       Medication List       Accurate as of May 28, 2020  3:16 PM. If you have any questions, ask your nurse or doctor.        acetaminophen 325 MG tablet Commonly known as: TYLENOL Take 925 mg by mouth every 6 (six) hours as needed for mild pain or moderate pain.   amantadine 100 MG capsule Commonly known as: SYMMETREL Take 1 capsule (100 mg total) by mouth daily.   aspirin 325 MG tablet Take 325 mg by mouth 2 (two) times daily.   atorvastatin 20 MG tablet Commonly known as: LIPITOR Take 1 tablet (20 mg total) by mouth daily.   carbidopa-levodopa 25-100 MG tablet Commonly known as: SINEMET  IR Take 1 tablet orally two times a day at 7am, and 10pm. Take 0.5 tablet orally at 10am, 1pm, 4pm, and 7pm.   ketoconazole 2 % cream Commonly known as: NIZORAL Apply 1 application topically daily. Apply to flaky areas daily as needed   losartan 100 MG tablet Commonly known as: COZAAR Take 1 tablet (100 mg total) by mouth daily.   Melatonin 3 MG Caps Take 1-2 tablets by mouth 2 hours before bedtime   meloxicam 7.5 MG tablet Commonly known as: MOBIC Take 1 tablet (7.5 mg total) by mouth daily.   metoprolol succinate 25 MG 24 hr tablet Commonly known as: TOPROL-XL Take 1 tablet (25 mg total) by mouth 2 (two) times daily.   metoprolol tartrate 25 MG tablet Commonly known as: LOPRESSOR TAKE 1 TABLET TWICE DAILY   NON FORMULARY Diet Type: Regular   omeprazole 20 MG tablet Commonly known as: PRILOSEC OTC Take 40 mg by mouth daily.   omeprazole 40 MG capsule Commonly known as: PRILOSEC Take 1 capsule by mouth daily.   penicillin v potassium 500 MG tablet Commonly known as: VEETID Take 500 mg by mouth 4 (four) times daily.   Potassium Citrate 15 MEQ (1620 MG) Tbcr Take 1 tablet by mouth 2 (two) times daily.   ranolazine 500 MG 12 hr  tablet Commonly known as: RANEXA   rOPINIRole 2 MG tablet Commonly known as: REQUIP Take 1 tablet (2 mg total) by mouth every 3 (three) hours. At 7am, and 1pm.   selegiline 5 MG capsule Commonly known as: ELDEPRYL Take 1 capsule (5 mg total) by mouth 2 (two) times daily. At 7am and 1pm.   tadalafil 10 MG tablet Commonly known as: CIALIS TAKE 2 TABLETS BY MOUTH DAILY AS NEEDED   testosterone cypionate 200 MG/ML injection Commonly known as: DEPOTESTOSTERONE CYPIONATE Inject 1.2 mLs (240 mg total) into the muscle every 14 (fourteen) days.   traZODone 150 MG tablet Commonly known as: DESYREL Take 0.5 tablets (75 mg total) by mouth at bedtime as needed.       Allergies:  Allergies  Allergen Reactions  . Phenergan [Promethazine  Hcl] Other (See Comments)    Unknown   . Reglan [Metoclopramide] Other (See Comments)    Pass out     Family History: History reviewed. No pertinent family history.  Social History:  reports that he has never smoked. He has never used smokeless tobacco. He reports current alcohol use. He reports current drug use. Drug: Marijuana.   Physical Exam: BP 121/70   Pulse 74   Ht 5\' 7"  (1.702 m)   Wt 175 lb (79.4 kg)   BMI 27.41 kg/m   Constitutional:  Alert and oriented, No acute distress. HEENT: [place] AT, moist mucus membranes.  Trachea midline, no masses. Cardiovascular: No clubbing, cyanosis, or edema. Respiratory: Normal respiratory effort, no increased work of breathing. Skin: No rashes, bruises or suspicious lesions. Neurologic: Grossly intact, no focal deficits, moving all 4 extremities. Psychiatric: Normal mood and affect.  Laboratory Data:  Lab Results  Component Value Date   TESTOSTERONE 295 05/20/2020     Assessment & Plan:    1. Hypogonadism  -Received testosterone injection today -71-month testosterone, hematocrit -1 year follow-up with PSA, testosterone, hematocrit   [facility] [address] [place], [place] [address] [phone]  I, Selena Batten, am acting as a scribe for Dr. Nicki Reaper C. Stoioff,  I have reviewed the above documentation for accuracy and completeness, and I agree with the above.   Charles Sons, MD

## 2020-05-28 ENCOUNTER — Ambulatory Visit (INDEPENDENT_AMBULATORY_CARE_PROVIDER_SITE_OTHER): Payer: Medicare Other | Admitting: Urology

## 2020-05-28 ENCOUNTER — Encounter: Payer: Self-pay | Admitting: Urology

## 2020-05-28 ENCOUNTER — Other Ambulatory Visit: Payer: Self-pay

## 2020-05-28 VITALS — BP 121/70 | HR 74 | Ht 67.0 in | Wt 175.0 lb

## 2020-05-28 DIAGNOSIS — E291 Testicular hypofunction: Secondary | ICD-10-CM | POA: Diagnosis not present

## 2020-05-28 MED ORDER — TESTOSTERONE CYPIONATE 200 MG/ML IM SOLN
200.0000 mg | Freq: Once | INTRAMUSCULAR | Status: AC
  Administered 2020-05-28: 200 mg via INTRAMUSCULAR

## 2020-05-28 NOTE — Progress Notes (Signed)
Testosterone IM Injection  Due to Hypogonadism patient is present today for a Testosterone Injection.  Medication: Testosterone Cypionate Dose: 11ml  Location: right upper outer buttocks Lot: [id number] a Exp:20/2022  Patient tolerated well, no complications were noted  Preformed by: Gaspar Cola CMA  Follow up: in 2 weeks

## 2020-05-31 ENCOUNTER — Encounter: Payer: Self-pay | Admitting: Urology

## 2020-06-01 ENCOUNTER — Ambulatory Visit: Payer: Medicare Other

## 2020-06-01 ENCOUNTER — Other Ambulatory Visit: Payer: Self-pay

## 2020-06-01 DIAGNOSIS — M25611 Stiffness of right shoulder, not elsewhere classified: Secondary | ICD-10-CM

## 2020-06-01 DIAGNOSIS — M6281 Muscle weakness (generalized): Secondary | ICD-10-CM | POA: Diagnosis not present

## 2020-06-01 NOTE — Therapy (Signed)
[place] MAIN [facility] SERVICES [address] [place], [place], [address] Phone: [phone]   Fax:  [phone]  Physical Therapy Treatment  Patient Details  Name: DARIEN KADING MRN: [id number] Date of Birth: 11-28-1950 Referring Provider (PT): gage, mark   Encounter Date: 06/01/2020   PT End of Session - 06/01/20 1448    Visit Number 9    Number of Visits 17    Date for PT Re-Evaluation 06/24/20    PT Start Time 1430    PT Stop Time 1515    PT Time Calculation (min) 45 min    Activity Tolerance Patient tolerated treatment well    Behavior During Therapy [facility] for tasks assessed/performed           Past Medical History:  Diagnosis Date   Anxiety    Cancer ([place])    Depression    Hypertension    Parkinson's disease ([place])     Past Surgical History:  Procedure Laterality Date   BACK [place]     for Parkinson's disease   JOINT REPLACEMENT     left knee x2   ORCHIECTOMY  1983    There were no vitals filed for this visit.   Subjective Assessment - 06/01/20 1432    Subjective Pt doing well this date. Patient notes shoulder is okay, sometimes gets into a position that is a little uncomfortable. No other problems or concerns with the shoulder.    Pertinent History Patient had rTSA 08/27/19, Patient sustained 2 dislocations (Dec 20, Jan 20). In February, pt underwent Rt rTSA. Pt worked with [place] after surgery, then transitioned to OPPT for resuming rehab.    Currently in Pain? No/denies               TREATMENT     Manual Therapy  PROM R shoulder flexion and abduction within comfortable range and never pushing through resistance x multiple bouts each, very mild end range pain reported; PROM R shoulder in scapular plane into ER to 40 degrees, no pain and no resistance encountered;     Ther-ex  Hooklyng extension isometrics, towel behind arm 2 x 10 reps,  holding for 3-5 secs; Hooklying serratus punches 2 x 10, holding for 3-5 secs, cueing to keep arm straight; L sidelying R shoulder ER against gravity with towel under arm, cued for correct positioning x 10; Seated RUE flexion table slides 2 x 10 reps (3-5sec holds);  Seated RUE abduction table slides 2 x 10 reps (3-5sec holds); cues needed for proper positioning Seated RUE external rotation table slides 2 x 10 reps (3-5sec hold); cues needed for proper positioning Seated BUE biceps curl 1#, 3 x 10;   Rows to neutral 2 x 10 reps Towel squeeze for isometric adduction 2 x 10 reps (3-5 sec holds)  UE ranger AAROM: Flexion and circles (CW/CCW) x 10 each direction; Abduction x 5, painful so stopped    Pt educated throughout session about proper posture and technique with exercises. Improved exercise technique, movement at target joints, use of target muscles after min to mod verbal, visual, tactile cues.      Pt demonstrates excellent motivation today during session. Patient walked into clinic with rolling walker today. Continued exercises to strengthen back and postural muscles. Utilized UE Ranger to strengthen shoulder musculature with AAROM. Patient able to tolerate more range during PROM. Verbal and visual cues utilized to correct form. He  is encouraged to continue HEP and follow-up as scheduled. He will benefit from PT services to address deficits in R shoulder strength in order to improve function at home.             PT Short Term Goals - 04/30/20 1123      PT SHORT TERM GOAL #1   Title Patient will be independent in home exercise program to improve strength/mobility for better functional independence with ADLs.    Baseline Patient is not able to reach behind his back to wash it.    Time 4    Period Weeks    Status New    Target Date 05/28/20      PT SHORT TERM GOAL #2   Title Patient will increase BLE gross strength to 4+/5 as to improve functional strength for independent  gait, increased standing tolerance and increased ADL ability.    Baseline 04/29/20= -3/5 strength R shoulder flex/abd/ER/IR    Time 4    Status New    Target Date 05/28/20             PT Long Term Goals - 04/30/20 1128      PT LONG TERM GOAL #1   Title Patient will report a worst pain of 3/10 on VAS in R shoulder to improve tolerance with ADLs and reduced symptoms with activities.    Baseline 04/29/20= 4/10 right shoulder    Time 8    Period Weeks    Status New    Target Date 06/25/20      PT LONG TERM GOAL #2   Title Patient will improve shoulder AROM to > 135 degrees of flexion, scaption, and abduction for improved ability to perform overhead activities, ER30 active, IR functionally to L 1.    Time 8    Period Weeks    Status New    Target Date 06/25/20      PT LONG TERM GOAL #3   Title Patient will improve shoulder elevation and ER in scapular plane  PROM to > 140 degrees of flexion, ER 40 deg passively    Baseline 04/29/20= see evaluation    Time 8    Period Weeks    Status New    Target Date 06/25/20      PT LONG TERM GOAL #4   Title Patient will improve his quick dash score and FOTO score to indicate functional improvements    Baseline quick dash score = 70.45    Time 8    Period Weeks    Status New    Target Date 06/25/20                 Plan - 06/01/20 1521    Clinical Impression Statement Pt demonstrates excellent motivation today during session. Patient walked into clinic with rolling walker today. Continued exercises to strengthen back and postural muscles. Utilized UE Ranger to strengthen shoulder musculature with AAROM. Patient able to tolerate more range during PROM. Verbal and visual cues utilized to correct form. He is encouraged to continue HEP and follow-up as scheduled. He will benefit from PT services to address deficits in R shoulder strength in order to improve function at home.    Personal Factors and Comorbidities Age     Examination-Activity Limitations Bed Mobility;Locomotion Level;Reach Overhead;Bathing;Carry;Lift;Stand    Examination-Participation Restrictions Community Activity;Cleaning;Church;Driving;Meal Prep;Yard Work    Stability/Clinical Decision Making Stable/Uncomplicated    Rehab Potential Good    PT Frequency 2x / week    PT  Duration 8 weeks    PT Treatment/Interventions Joint Manipulations;Dry needling;Passive range of motion;Cryotherapy;Electrical Stimulation;Moist Heat;Ultrasound;Therapeutic activities;Functional mobility training;Therapeutic exercise;Patient/family education;Manual techniques    PT Next Visit Plan Update outcome measures, goals, and progress note; Continue strengthening shoulder and back musculature, ROM    PT Home Exercise Plan Mebridge Access Code: [email or web address]    Consulted and Agree with Plan of Care Patient           Patient will benefit from skilled therapeutic intervention in order to improve the following deficits and impairments:  Decreased range of motion, Decreased strength, Impaired flexibility, Decreased safety awareness, Decreased mobility, Pain, Decreased activity tolerance  Visit Diagnosis: Muscle weakness (generalized)  Stiffness of right shoulder, not elsewhere classified     Problem List Patient Active Problem List   Diagnosis Date Noted   Arthritis of left shoulder region 05/08/2019   Abnormal glucose 12/05/2018   Essential hypertension, benign 09/15/2018   Dyslipidemia 09/15/2018   Abnormal stress test 07/20/2018   Coronary artery disease involving native coronary artery of native heart 07/20/2018   CKD (chronic kidney disease) stage 3, GFR 30-59 ml/min 07/13/2018   Erectile dysfunction 07/13/2018   Insomnia 07/13/2018   Hypogonadism in male 06/18/2018   Testicular cancer ([place]) 06/13/2018   Hyperlipidemia 06/13/2018   Depression 06/13/2018   Anxiety 06/13/2018   Callus of foot 03/27/2018   Pes planovalgus, acquired, right  01/30/2018   Arthritis of midfoot 01/30/2018   Achilles tendon contracture, right 01/30/2018   Left shoulder pain 07/26/2017   Infection of prosthetic left knee joint ([place]) 07/10/2017   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[place]) 05/15/2017   GERD (gastroesophageal reflux disease) 03/20/2017   Primary osteoarthritis of one knee, right 12/30/2016   H/O total knee replacement, left 12/30/2016   Moderate recurrent major depression ([place]) 08/15/2016   Chest pain due to myocardial ischemia 08/14/2016   Chest pain, rule out acute myocardial infarction 08/14/2016   Chest x-ray abnormality 10/17/2013   Scoliosis 07/11/2012   Arthritis of wrist, left, degenerative 07/11/2012   Parkinson's disease ([place]) 01/07/2012    This entire session was performed under direct supervision and direction of a licensed therapist/therapist assistant . I have personally read, edited and approve of the note as written.   Noemi Chapel, SPT [facility]out PT, DPT, GCS  Huprich,Jason 06/01/2020, 4:33 PM  [place] MAIN [facility] SERVICES [address] [place], [place], [address] Phone: [phone]   Fax:  [phone]  Name: TRYGG MANTZ MRN: [id number] Date of Birth: 10/19/1951

## 2020-06-03 ENCOUNTER — Other Ambulatory Visit: Payer: Self-pay

## 2020-06-03 ENCOUNTER — Ambulatory Visit: Payer: Medicare Other

## 2020-06-03 DIAGNOSIS — G8929 Other chronic pain: Secondary | ICD-10-CM

## 2020-06-03 DIAGNOSIS — M25611 Stiffness of right shoulder, not elsewhere classified: Secondary | ICD-10-CM

## 2020-06-03 DIAGNOSIS — M6281 Muscle weakness (generalized): Secondary | ICD-10-CM

## 2020-06-03 NOTE — Therapy (Signed)
[place] MAIN [facility] SERVICES [address] [place], [place], [address] Phone: [phone]   Fax:  [phone]  Physical Therapy Treatment / Progress Note Dates of reporting period 04/29/20 to 06/03/20  Patient Details  Name: Charles Choi MRN: [id number] Date of Birth: [birth year] Referring Provider (PT): gage, mark   Encounter Date: 06/03/2020   PT End of Session - 06/03/20 1437    Visit Number 10    Number of Visits 17    Date for PT Re-Evaluation 06/24/20    Authorization Type Eval: 6/23; Progress Note: 7/28    PT Start Time 1435    PT Stop Time 1515    PT Time Calculation (min) 40 min    Activity Tolerance Patient tolerated treatment well    Behavior During Therapy [facility] for tasks assessed/performed           Past Medical History:  Diagnosis Date   Anxiety    Cancer ([place])    Depression    Hypertension    Parkinson's disease ([place])     Past Surgical History:  Procedure Laterality Date   [place]     for Parkinson's disease   JOINT REPLACEMENT     left knee x2   ORCHIECTOMY  1983    There were no vitals filed for this visit.   Subjective Assessment - 06/03/20 1436    Subjective Pt doing well this date. Patient notes shoulder is sore. Patient stumbled and used his R arm to help catch himself, but did not hit the ground. He reports no longterm effects of R arm just soreness. No other problems or concerns with the shoulder.    Pertinent History Patient had rTSA 08/27/19, Patient sustained 2 dislocations (Dec 20, Jan 20). In February, pt underwent Rt rTSA. Pt worked with [place] after surgery, then transitioned to OPPT for resuming rehab.    Currently in Pain? Yes    Pain Score 2     Pain Location Shoulder    Pain Orientation Right    Pain Descriptors / Indicators Aching;Sore    Pain Type Chronic pain             TREATMENT  Updated goals today.   Patient reports 2/10 pain in R shoulder at its worst.  AROM in sitting: R: Flexion: 113 R: Abduction: 94 R: Scaption: 94  PROM in supine: R: Scaption: 151; R: ER: 59  Quick Dash: 47.7% (previously 70.45%) Foto: 48 (previously 44)   Manual Therapy  PROM R shoulder flexion and abduction within comfortable range and never pushing through resistance x multiple bouts each, very mild end range pain reported; PROM R shoulder in scapular plane into ER to 40 degrees, no pain and no resistance encountered;    Ther-ex  Hookyling extension isometrics, towel behind arm 2 x 10 reps, holding for 3-5 secs; Hookyling serratus punches 2 x 10, holding for 3-5 secs, cueing to keep arm straight; Seated RUE flexion table slides 2 x 10 reps (3-5sec holds);  Seated RUE abduction table slides 2 x 10 reps (3-5sec holds); Seated BUE biceps curl 1#, 2 x 15;   Rows to neutral 2 x 10.   Pt educated throughout session about proper posture and technique with exercises. Improved exercise technique, movement at target joints, use of target muscles after min to mod verbal, visual, tactile cues.    Pt demonstrates excellent motivation today during session. Patient  increased AROM for abduction and PROM for ER. His quick dash and FOTO also improved. Patient is able to get more motion as end range is further. Continued with exercises to strengthen back and scapular musculature. He is encouraged to continue HEP and follow-up as scheduled. He will benefit from PT services to address deficits in R shoulder strength in order to improve function at home.             PT Short Term Goals - 06/03/20 1644      PT SHORT TERM GOAL #1   Title Patient will be independent in home exercise program to improve strength/mobility for better functional independence with ADLs.    Baseline Patient is not able to reach behind his back to wash it.; 06/03/20: Patient reports doing some of his exercises at home.    Time 4    Period  Weeks    Status Partially Met    Target Date 05/28/20      PT SHORT TERM GOAL #2   Title Patient will increase BLE gross strength to 4+/5 as to improve functional strength for independent gait, increased standing tolerance and increased ADL ability.    Baseline 04/29/20= -3/5 strength R shoulder flex/abd/ER/IR; 06/03/20: 3/5 strength of shoulder flex/abd/ER/IR, unable to resist weight at this time    Time 4    Status On-going    Target Date 05/28/20             PT Long Term Goals - 06/03/20 1645      PT LONG TERM GOAL #1   Title Patient will report a worst pain of 3/10 on VAS in R shoulder to improve tolerance with ADLs and reduced symptoms with activities.    Baseline 04/29/20= 4/10 right shoulder; 06/03/20: 2/10 right shoulder    Time 8    Period Weeks    Status Achieved      PT LONG TERM GOAL #2   Title Patient will improve shoulder AROM to > 135 degrees of flexion, scaption, and abduction for improved ability to perform overhead activities, ER30 active, IR functionally to L 1.    Baseline 06/03/20: Flexion: 113, Scaption 94, Abduction: 94    Time 8    Period Weeks    Status On-going    Target Date 06/25/20      PT LONG TERM GOAL #3   Title Patient will improve shoulder elevation and ER in scapular plane  PROM to > 140 degrees of flexion, ER 40 deg passively    Baseline 04/29/20= see evaluation; 06/03/20: Scaption: 151; ER: 59    Time 8    Period Weeks    Status Achieved      PT LONG TERM GOAL #4   Title Patient will improve his quick dash score and FOTO score to indicate functional improvements    Baseline 6/23: quick dash score = 70.45; 06/03/20: Quick dash: 47.7, FOTO: 48    Time 8    Period Weeks    Status On-going    Target Date 06/25/20                 Plan - 06/03/20 1437    Clinical Impression Statement Pt demonstrates excellent motivation today during session. Patient increased AROM for abduction and PROM for ER. His quick dash and FOTO also improved.  Patient is able to get more motion as end range is further.  Continued with exercises to strengthen back and scapular musculature. He is encouraged to continue HEP and follow-up  as scheduled. He will benefit from PT services to address deficits in R shoulder strength in order to improve function at home.    Personal Factors and Comorbidities Age    Examination-Activity Limitations Bed Mobility;Locomotion Level;Reach Overhead;Bathing;Carry;Lift;Stand    Examination-Participation Restrictions Community Activity;Cleaning;Church;Driving;Meal Prep;Yard Work    Stability/Clinical Decision Making Stable/Uncomplicated    Rehab Potential Good    PT Frequency 2x / week    PT Duration 8 weeks    PT Treatment/Interventions Joint Manipulations;Dry needling;Passive range of motion;Cryotherapy;Electrical Stimulation;Moist Heat;Ultrasound;Therapeutic activities;Functional mobility training;Therapeutic exercise;Patient/family education;Manual techniques    PT Next Visit Plan Continue strengthening shoulder and back musculature, ROM    PT Home Exercise Plan Mebridge Access Code: [email or web address]    Consulted and Agree with Plan of Care Patient           Patient will benefit from skilled therapeutic intervention in order to improve the following deficits and impairments:  Decreased range of motion, Decreased strength, Impaired flexibility, Decreased safety awareness, Decreased mobility, Pain, Decreased activity tolerance  Visit Diagnosis: Muscle weakness (generalized)  Stiffness of right shoulder, not elsewhere classified  Chronic right shoulder pain     Problem List Patient Active Problem List   Diagnosis Date Noted   Arthritis of left shoulder region 05/08/2019   Abnormal glucose 12/05/2018   Essential hypertension, benign 09/15/2018   Dyslipidemia 09/15/2018   Abnormal stress test 07/20/2018   Coronary artery disease involving native coronary artery of native heart 07/20/2018   CKD (chronic  kidney disease) stage 3, GFR 30-59 ml/min 07/13/2018   Erectile dysfunction 07/13/2018   Insomnia 07/13/2018   Hypogonadism in male 06/18/2018   Testicular cancer ([place]) 06/13/2018   Hyperlipidemia 06/13/2018   Depression 06/13/2018   Anxiety 06/13/2018   Callus of foot 03/27/2018   Pes planovalgus, acquired, right 01/30/2018   Arthritis of midfoot 01/30/2018   Achilles tendon contracture, right 01/30/2018   Left shoulder pain 07/26/2017   Infection of prosthetic left knee joint ([place]) 07/10/2017   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[place]) 05/15/2017   GERD (gastroesophageal reflux disease) 03/20/2017   Primary osteoarthritis of one knee, right 12/30/2016   H/O total knee replacement, left 12/30/2016   Moderate recurrent major depression ([place]) 08/15/2016   Chest pain due to myocardial ischemia 08/14/2016   Chest pain, rule out acute myocardial infarction 08/14/2016   Chest x-ray abnormality 10/17/2013   Scoliosis 07/11/2012   Arthritis of wrist, left, degenerative 07/11/2012   Parkinson's disease ([place]) 01/07/2012    This entire session was performed under direct supervision and direction of a licensed therapist/therapist assistant . I have personally read, edited and approve of the note as written.   Noemi Chapel, SPT [facility] PT, DPT, GCS  Huprich,Jason 06/04/2020, 2:46 PM  [place] MAIN [facility] SERVICES [address] [place], [place], [address] Phone: [phone]   Fax:  [phone]  Name: Charles Choi MRN: [id number] Date of Birth: [birth year]

## 2020-06-08 ENCOUNTER — Ambulatory Visit: Payer: Medicare Other

## 2020-06-10 ENCOUNTER — Ambulatory Visit: Payer: Medicare Other | Attending: Orthopedic Surgery

## 2020-06-10 ENCOUNTER — Other Ambulatory Visit: Payer: Self-pay

## 2020-06-10 DIAGNOSIS — M25511 Pain in right shoulder: Secondary | ICD-10-CM | POA: Insufficient documentation

## 2020-06-10 DIAGNOSIS — M6281 Muscle weakness (generalized): Secondary | ICD-10-CM

## 2020-06-10 DIAGNOSIS — G8929 Other chronic pain: Secondary | ICD-10-CM | POA: Diagnosis present

## 2020-06-10 DIAGNOSIS — M25611 Stiffness of right shoulder, not elsewhere classified: Secondary | ICD-10-CM | POA: Diagnosis present

## 2020-06-10 NOTE — Therapy (Signed)
[facility] [address] [place], [place], [address] Phone: [phone]   Fax:  [phone]  Physical Therapy Treatment  Patient Details  Name: Charles Choi MRN: [id number] Date of Birth: 11/27/1950 Referring Provider (PT): gage, mark   Encounter Date: 06/10/2020   PT End of Session - 06/10/20 1441    Visit Number 11    Number of Visits 17    Date for PT Re-Evaluation 06/24/20    Authorization Type Eval: 6/23; Progress Note: 7/28    PT Start Time 1433    PT Stop Time 1515    PT Time Calculation (min) 42 min    Activity Tolerance Patient tolerated treatment well    Behavior During Therapy WFL for tasks assessed/performed           Past Medical History:  Diagnosis Date  . Anxiety   . Cancer (HCC)   . Depression   . Hypertension   . Parkinson's disease (HCC)     Past Surgical History:  Procedure Laterality Date  . BACK SURGERY    . CHOLECYSTECTOMY    . DEEP BRAIN STIMULATOR PLACEMENT     for Parkinson's disease  . JOINT REPLACEMENT     left knee x2  . ORCHIECTOMY  1983    There were no vitals filed for this visit.   Subjective Assessment - 06/10/20 1438    Subjective Pt doing well this date and no new updates. No other problems or concerns with the shoulder.    Pertinent History Patient had rTSA 08/27/19, Patient sustained 2 dislocations (Dec '20, Jan '20). In February, pt underwent Rt rTSA. Pt worked with HHPT after surgery, then transitioned to OPPT for resuming rehab.    Currently in Pain? No/denies              TREATMENT    Ther-ex  Hookyling extension isometrics, towel behind arm 2 x 10 reps, holding for 3-5 secs; Hookyling serratus punches 2 x 10, holding for 3-5 secs, cueing to keep arm straight; L sidelying R shoulder ER against gravity with towel under arm, cued for correct positioning x 10; Seated RUE flexion table slides 2 x 10 reps (3-5 sec holds);  Seated RUE abduction table slides  2 x 10 reps (3-5 sec holds); cues needed for proper positioning Seated RUE external rotation table slides 2 x 10 reps (3-5 sec hold); cues needed for proper positioning Seated short lever R shoulder abduction in scapular plane 2 x 10, Seated BUE biceps curl 1#, 2 x 10;   Seated rows to neutral 2 x 10 Sitting isometric ER x 10   Pt educated throughout session about proper posture and technique with exercises. Improved exercise technique, movement at target joints, use of target muscles after min to mod verbal, visual, tactile cues.    Pt demonstrates excellent motivation today during session. Patient is able to get more motion as end range is further. Continued strengthening shoulder and back musculature. Short lever shoulder flexion is difficult for patient as his elbow wants to come out into scaption or abduction. He is encouraged to continue HEP and follow-up as scheduled. He will benefit from PT services to address deficits in R shoulder strength in order to improve function at home.           PT Short Term Goals - 06/03/20 1644      PT SHORT TERM GOAL #1   Title Patient will be independent in home exercise program to   improve strength/mobility for better functional independence with ADLs.    Baseline Patient is not able to reach behind his back to wash it.; 06/03/20: Patient reports doing some of his exercises at home.    Time 4    Period Weeks    Status Partially Met    Target Date 05/28/20      PT SHORT TERM GOAL #2   Title Patient will increase BLE gross strength to 4+/5 as to improve functional strength for independent gait, increased standing tolerance and increased ADL ability.    Baseline 04/29/20= -3/5 strength R shoulder flex/abd/ER/IR; 06/03/20: 3/5 strength of shoulder flex/abd/ER/IR, unable to resist weight at this time    Time 4    Status On-going    Target Date 05/28/20             PT Long Term Goals - 06/03/20 1645      PT LONG TERM GOAL #1   Title Patient  will report a worst pain of 3/10 on VAS in R shoulder to improve tolerance with ADLs and reduced symptoms with activities.    Baseline 04/29/20= 4/10 right shoulder; 06/03/20: 2/10 right shoulder    Time 8    Period Weeks    Status Achieved      PT LONG TERM GOAL #2   Title Patient will improve shoulder AROM to > 135 degrees of flexion, scaption, and abduction for improved ability to perform overhead activities, ER30 active, IR functionally to L 1.    Baseline 06/03/20: Flexion: 113, Scaption 94, Abduction: 94    Time 8    Period Weeks    Status On-going    Target Date 06/25/20      PT LONG TERM GOAL #3   Title Patient will improve shoulder elevation and ER in scapular plane  PROM to > 140 degrees of flexion, ER 40 deg passively    Baseline 04/29/20= see evaluation; 06/03/20: Scaption: 151; ER: 59    Time 8    Period Weeks    Status Achieved      PT LONG TERM GOAL #4   Title Patient will improve his quick dash score and FOTO score to indicate functional improvements    Baseline 6/23: quick dash score = 70.45, FOTO: 44 ; 06/03/20: Quick dash: 47.7, FOTO: 48    Time 8    Period Weeks    Status On-going    Target Date 06/25/20                 Plan - 06/10/20 1525    Clinical Impression Statement Pt demonstrates excellent motivation today during session. Patient is able to get more motion as end range is further. Continued strengthening shoulder and back musculature. Short lever shoulder flexion is difficult for patient as his elbow wants to come out into scaption or abduction. He is encouraged to continue HEP and follow-up as scheduled. He will benefit from PT services to address deficits in R shoulder strength in order to improve function at home.    Personal Factors and Comorbidities Age    Examination-Activity Limitations Bed Mobility;Locomotion Level;Reach Overhead;Bathing;Carry;Lift;Stand    Examination-Participation Restrictions Community Activity;Cleaning;Church;Driving;Meal  Prep;Yard Work    Stability/Clinical Decision Making Stable/Uncomplicated    Rehab Potential Good    PT Frequency 2x / week    PT Duration 8 weeks    PT Treatment/Interventions Joint Manipulations;Dry needling;Passive range of motion;Cryotherapy;Electrical Stimulation;Moist Heat;Ultrasound;Therapeutic activities;Functional mobility training;Therapeutic exercise;Patient/family education;Manual techniques    PT Next Visit Plan Continue strengthening shoulder  and back musculature, ROM    PT Home Exercise Plan Mebridge Access Code: [email or web address]    Consulted and Agree with Plan of Care Patient           Patient will benefit from skilled therapeutic intervention in order to improve the following deficits and impairments:  Decreased range of motion, Decreased strength, Impaired flexibility, Decreased safety awareness, Decreased mobility, Pain, Decreased activity tolerance  Visit Diagnosis: Muscle weakness (generalized)  Stiffness of right shoulder, not elsewhere classified     Problem List Patient Active Problem List   Diagnosis Date Noted  . Arthritis of left shoulder region 05/08/2019  . Abnormal glucose 12/05/2018  . Essential hypertension, benign 09/15/2018  . Dyslipidemia 09/15/2018  . Abnormal stress test 07/20/2018  . Coronary artery disease involving native coronary artery of native heart 07/20/2018  . CKD (chronic kidney disease) stage 3, GFR 30-59 ml/min 07/13/2018  . Erectile dysfunction 07/13/2018  . Insomnia 07/13/2018  . Hypogonadism in male 06/18/2018  . Testicular cancer (HCC) 06/13/2018  . Hyperlipidemia 06/13/2018  . Depression 06/13/2018  . Anxiety 06/13/2018  . Callus of foot 03/27/2018  . Pes planovalgus, acquired, right 01/30/2018  . Arthritis of midfoot 01/30/2018  . Achilles tendon contracture, right 01/30/2018  . Left shoulder pain 07/26/2017  . Infection of prosthetic left knee joint (HCC) 07/10/2017  . 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HCC) 05/15/2017   . GERD (gastroesophageal reflux disease) 03/20/2017  . Primary osteoarthritis of one knee, right 12/30/2016  . H/O total knee replacement, left 12/30/2016  . Moderate recurrent major depression (HCC) 08/15/2016  . Chest pain due to myocardial ischemia 08/14/2016  . Chest pain, rule out acute myocardial infarction 08/14/2016  . Chest x-ray abnormality 10/17/2013  . Scoliosis 07/11/2012  . Arthritis of wrist, left, degenerative 07/11/2012  . Parkinson's disease (HCC) 01/07/2012   This entire session was performed under direct supervision and direction of a licensed therapist/therapist assistant . I have personally read, edited and approve of the note as written.   Kimmie Wong, SPT Jason D Huprich PT, DPT, GCS  Huprich,Jason 06/11/2020, 10:15 AM  [facility] [address] [place], [place], [address] Phone: [phone]   Fax:  [phone]  Name: Phineas K Kingsford MRN: [id number] Date of Birth: 10/17/1951   

## 2020-06-15 ENCOUNTER — Ambulatory Visit: Payer: Medicare Other

## 2020-06-15 ENCOUNTER — Ambulatory Visit (INDEPENDENT_AMBULATORY_CARE_PROVIDER_SITE_OTHER): Payer: Medicare Other

## 2020-06-15 ENCOUNTER — Other Ambulatory Visit: Payer: Self-pay

## 2020-06-15 ENCOUNTER — Other Ambulatory Visit
Admission: RE | Admit: 2020-06-15 | Discharge: 2020-06-15 | Disposition: A | Discharge status: Home / Self Care | Payer: Medicare Other | Source: Ambulatory Visit | Attending: Internal Medicine | Admitting: Internal Medicine

## 2020-06-15 DIAGNOSIS — E291 Testicular hypofunction: Secondary | ICD-10-CM | POA: Diagnosis not present

## 2020-06-15 DIAGNOSIS — M25611 Stiffness of right shoulder, not elsewhere classified: Secondary | ICD-10-CM

## 2020-06-15 DIAGNOSIS — Z01812 Encounter for preprocedural laboratory examination: Secondary | ICD-10-CM | POA: Diagnosis present

## 2020-06-15 DIAGNOSIS — Z20822 Contact with and (suspected) exposure to covid-19: Secondary | ICD-10-CM | POA: Insufficient documentation

## 2020-06-15 DIAGNOSIS — M6281 Muscle weakness (generalized): Secondary | ICD-10-CM | POA: Diagnosis not present

## 2020-06-15 MED ORDER — TESTOSTERONE CYPIONATE 200 MG/ML IM SOLN
240.0000 mg | Freq: Once | INTRAMUSCULAR | Status: AC
  Administered 2020-06-15: 240 mg via INTRAMUSCULAR

## 2020-06-15 NOTE — Progress Notes (Signed)
Testosterone IM Injection  Due to Hypogonadism patient is present today for a Testosterone Injection.  Medication: Testosterone Cypionate Dose: 1.9ml Location: left upper outer buttocks Lot: [id number] Exp:04/2022  Patient tolerated well, no complications were noted  Preformed by: Fonnie Jarvis, CMA  Follow up: 2wk injection

## 2020-06-15 NOTE — Therapy (Signed)
[place] MAIN [facility] SERVICES [address] [place], [place], [address] Phone: [phone]   Fax:  [phone]  Physical Therapy Treatment  Patient Details  Name: Charles Choi MRN: [id number] Date of Birth: 07/28/1951 Referring Provider (PT): gage, mark   Encounter Date: 06/15/2020   PT End of Session - 06/15/20 1439    Visit Number 12    Number of Visits 17    Date for PT Re-Evaluation 06/24/20    Authorization Type Eval: 6/23; Progress Note: 7/28    PT Start Time 1435    PT Stop Time 1515    PT Time Calculation (min) 40 min    Activity Tolerance Patient tolerated treatment well    Behavior During Therapy [facility] for tasks assessed/performed           Past Medical History:  Diagnosis Date  . Anxiety   . Cancer ([place])   . Depression   . Hypertension   . Parkinson's disease [facility])     Past Surgical History:  Procedure Laterality Date  . BACK SURGERY    . CHOLECYSTECTOMY    . DEEP BRAIN STIMULATOR PLACEMENT     for Parkinson's disease  . JOINT REPLACEMENT     left knee x2  . ORCHIECTOMY  1983    There were no vitals filed for this visit.   Subjective Assessment - 06/15/20 1439    Subjective Pt doing well this date and no new updates. No other problems or concerns with the shoulder. No resting shoulder pain upon arrival. He feels like he is making good progress and notices himself using his arm more.    Pertinent History Patient had rTSA 08/27/19, Patient sustained 2 dislocations (Dec '20, Jan '20). In February, pt underwent Rt rTSA. Pt worked with [place] [facility] after surgery, then transitioned to OPPT for resuming rehab.    Currently in Pain? No/denies              TREATMENT    Ther-ex  R shoulder PROM for flexion, scaption, and ER within pain-free range, never pushing through resistance Hooklying extension (towel behind arm), flexion, abduction, ER, and IR isometrics 5s hold x 10 reps each; Hookyling serratus punches x 10,  holding for 3 secs, cueing to keep arm straight; L sidelying R shoulder ER against gravity with towel under arm, cued for correct positioning 2 x 10; Semirecumbent long lever R shoulder flexion x 10, abduction in scapular plane x 10, Seated long lever R shoulder flexion 2 x 10, notable fatigue develops by the end of each set; Seated BUE biceps curl 2# dumbbell 2 x 10;      Pt educated throughout session about proper posture and technique with exercises. Improved exercise technique, movement at target joints, use of target muscles after min to mod verbal, visual, tactile cues.     Pt demonstrates excellent motivation today during session. His range continues to progress nicely and he reports more spontaneous use of his RUE. He is encouraged by his progress. He continues to demonstrate significant weakness in R shoulder ER resulting in compensatory strategies with overhead AROM.  He is encouraged to continue HEP and follow-up as scheduled. He will benefit from PT services to address deficits in R shoulder strength in order to improve function at home.                               PT Short Term Goals -  06/03/20 [place] #1   Title Patient will be independent in home exercise program to improve strength/mobility for better functional independence with ADLs.    Baseline Patient is not able to reach behind his back to wash it.; 06/03/20: Patient reports doing some of his exercises at home.    Time 4    Period Weeks    Status Partially Met    Target Date 05/28/20      PT SHORT TERM GOAL #2   Title Patient will increase BLE gross strength to 4+/5 as to improve functional strength for independent gait, increased standing tolerance and increased ADL ability.    Baseline 04/29/20= -3/5 strength R shoulder flex/abd/ER/IR; 06/03/20: 3/5 strength of shoulder flex/abd/ER/IR, unable to resist weight at this time    Time 4    Status On-going    Target Date  05/28/20             PT Long Term Goals - 06/03/20 1645      PT LONG TERM GOAL #1   Title Patient will report a worst pain of 3/10 on VAS in R shoulder to improve tolerance with ADLs and reduced symptoms with activities.    Baseline 04/29/20= 4/10 right shoulder; 06/03/20: 2/10 right shoulder    Time 8    Period Weeks    Status Achieved      PT LONG TERM GOAL #2   Title Patient will improve shoulder AROM to > 135 degrees of flexion, scaption, and abduction for improved ability to perform overhead activities, ER30 active, IR functionally to L 1.    Baseline 06/03/20: Flexion: 113, Scaption 94, Abduction: 94    Time 8    Period Weeks    Status On-going    Target Date 06/25/20      PT LONG TERM GOAL #3   Title Patient will improve shoulder elevation and ER in scapular plane  PROM to > 140 degrees of flexion, ER 40 deg passively    Baseline 04/29/20= see evaluation; 06/03/20: Scaption: 151; ER: 59    Time 8    Period Weeks    Status Achieved      PT LONG TERM GOAL #4   Title Patient will improve his quick dash score and FOTO score to indicate functional improvements    Baseline 6/23: quick dash score = 70.45, FOTO: 44 ; 06/03/20: Quick dash: 47.7, FOTO: 48    Time 8    Period Weeks    Status On-going    Target Date 06/25/20                 Plan - 06/15/20 1439    Clinical Impression Statement Pt demonstrates excellent motivation today during session. His range continues to progress nicely and he reports more spontaneous use of his RUE. He is encouraged by his progress. He continues to demonstrate significant weakness in R shoulder ER resulting in compensatory strategies with overhead AROM.  He is encouraged to continue HEP and follow-up as scheduled. He will benefit from PT services to address deficits in R shoulder strength in order to improve function at home.    Personal Factors and Comorbidities Age    Examination-Activity Limitations Bed Mobility;Locomotion Level;Reach  Overhead;Bathing;Carry;Lift;Stand    Examination-Participation Restrictions Community Activity;Cleaning;Church;Driving;Meal Prep;Yard Work    Stability/Clinical Decision Making Stable/Uncomplicated    Rehab Potential Good    PT Frequency 2x / week    PT Duration 8 weeks  PT Treatment/Interventions Joint Manipulations;Dry needling;Passive range of motion;Cryotherapy;Electrical Stimulation;Moist Heat;Ultrasound;Therapeutic activities;Functional mobility training;Therapeutic exercise;Patient/family education;Manual techniques    PT Next Visit Plan Continue strengthening shoulder and back musculature, ROM    PT Home Exercise Plan Mebridge Access Code: [email or web address]    Consulted and Agree with Plan of Care Patient           Patient will benefit from skilled therapeutic intervention in order to improve the following deficits and impairments:  Decreased range of motion, Decreased strength, Impaired flexibility, Decreased safety awareness, Decreased mobility, Pain, Decreased activity tolerance  Visit Diagnosis: Muscle weakness (generalized)  Stiffness of right shoulder, not elsewhere classified     Problem List Patient Active Problem List   Diagnosis Date Noted  . Arthritis of left shoulder region 05/08/2019  . Abnormal glucose 12/05/2018  . Essential hypertension, benign 09/15/2018  . Dyslipidemia 09/15/2018  . Abnormal stress test 07/20/2018  . Coronary artery disease involving native coronary artery of native heart 07/20/2018  . CKD (chronic kidney disease) stage 3, GFR 30-59 ml/min 07/13/2018  . Erectile dysfunction 07/13/2018  . Insomnia 07/13/2018  . Hypogonadism in male 06/18/2018  . Testicular cancer ([place]) 06/13/2018  . Hyperlipidemia 06/13/2018  . Depression 06/13/2018  . Anxiety 06/13/2018  . Callus of foot 03/27/2018  . Pes planovalgus, acquired, right 01/30/2018  . Arthritis of midfoot 01/30/2018  . Achilles tendon contracture, right 01/30/2018  . Left shoulder pain  07/26/2017  . Infection of prosthetic left knee joint ([place]) 07/10/2017  . 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[place]) 05/15/2017  . GERD (gastroesophageal reflux disease) 03/20/2017  . Primary osteoarthritis of one knee, right 12/30/2016  . H/O total knee replacement, left 12/30/2016  . Moderate recurrent major depression ([place]) 08/15/2016  . Chest pain due to myocardial ischemia 08/14/2016  . Chest pain, rule out acute myocardial infarction 08/14/2016  . Chest x-ray abnormality 10/17/2013  . Scoliosis 07/11/2012  . Arthritis of wrist, left, degenerative 07/11/2012  . Parkinson's disease ([place]) 01/07/2012   Phillips Grout PT, DPT, GCS  Gerrianne Aydelott 06/15/2020, 3:36 PM  [place] MAIN [facility] SERVICES [address] [place], [place], [address] Phone: [phone]   Fax:  [phone]  Name: DOMINYK LAW MRN: [id number] Date of Birth: [birth year]

## 2020-06-16 ENCOUNTER — Ambulatory Visit: Admit: 2020-06-16 | Payer: Medicare Other | Admitting: Internal Medicine

## 2020-06-16 LAB — SARS CORONAVIRUS 2 (TAT 6-24 HRS): SARS Coronavirus 2: NEGATIVE

## 2020-06-16 SURGERY — COLONOSCOPY
Anesthesia: General

## 2020-06-17 ENCOUNTER — Ambulatory Visit: Admission: RE | Admit: 2020-06-17 | Payer: Medicare Other | Source: Home / Self Care | Admitting: Internal Medicine

## 2020-06-17 ENCOUNTER — Encounter: Admission: RE | Payer: Self-pay | Source: Home / Self Care

## 2020-06-17 ENCOUNTER — Ambulatory Visit: Payer: Medicare Other

## 2020-06-17 SURGERY — COLONOSCOPY WITH PROPOFOL
Anesthesia: General

## 2020-06-24 ENCOUNTER — Ambulatory Visit: Payer: Medicare Other

## 2020-06-24 ENCOUNTER — Other Ambulatory Visit: Payer: Self-pay

## 2020-06-24 DIAGNOSIS — G8929 Other chronic pain: Secondary | ICD-10-CM

## 2020-06-24 DIAGNOSIS — M6281 Muscle weakness (generalized): Secondary | ICD-10-CM | POA: Diagnosis not present

## 2020-06-24 DIAGNOSIS — M25611 Stiffness of right shoulder, not elsewhere classified: Secondary | ICD-10-CM

## 2020-06-24 DIAGNOSIS — M25511 Pain in right shoulder: Secondary | ICD-10-CM

## 2020-06-24 NOTE — Therapy (Signed)
[place] MAIN [facility] SERVICES [address] [place], [place], [address] Phone: [phone]   Fax:  [phone]  Physical Therapy Treatment / Re-certification  Patient Details  Name: Charles Choi MRN: [id number] Date of Birth: 06-10-1951 Referring Provider (PT): gage, mark   Encounter Date: 06/24/2020   PT End of Session - 06/24/20 1526    Visit Number 13    Number of Visits 33    Date for PT Re-Evaluation 08/19/20    Authorization Type Eval: 6/23; Progress Note: 7/28; Re-certification: 9/23;    PT Start Time 1517    PT Stop Time 1600    PT Time Calculation (min) 43 min    Activity Tolerance Patient tolerated treatment well    Behavior During Therapy WFL for tasks assessed/performed           Past Medical History:  Diagnosis Date  . Anxiety   . Cancer ([place])   . Depression   . Hypertension   . Parkinson's disease [facility])     Past Surgical History:  Procedure Laterality Date  . BACK SURGERY    . CHOLECYSTECTOMY    . DEEP BRAIN STIMULATOR PLACEMENT     for Parkinson's disease  . JOINT REPLACEMENT     left knee x2  . ORCHIECTOMY  1983    There were no vitals filed for this visit.   Subjective Assessment - 06/24/20 1525    Subjective Pt doing well this date and no new updates. No other problems or concerns with the shoulder. No resting shoulder pain upon arrival.    Pertinent History Patient had rTSA 08/27/19, Patient sustained 2 dislocations (Dec '20, Jan '20). In February, pt underwent Rt rTSA. Pt worked with [place] after surgery, then transitioned to OPPT for resuming rehab.    Currently in Pain? No/denies            TREATMENT    Ther-ex  R shoulder PROM for flexion, scaption, and ER within pain-free range, never pushing through resistance Hooklying extension (towel behind arm), flexion, abduction, ER, and IR isometrics 5s hold x 10 reps each; Hookyling serratus punches 2 x 10, holding for 3 secs, cueing to keep arm  straight; L sidelying R shoulder ER against gravity with towel under arm, cued for correct positioning 2 x 10; Seated BUE biceps curl 2# dumbbell 2 x 10;   Rows to neutral 2 x 10, cued to move shoulder blades, not elbows Towel squeezes for shoulder adduction 2 x 10 Seated long lever R shoulder flexion (straight sagittal place) 2 x 8, cued for proper technique and to keep arm position straight  Pt educated throughout session about proper posture and technique with exercises. Improved exercise technique, movement at target joints, use of target muscles after min to mod verbal, visual, tactile cues.     Patient is demonstrating excellent progression towards functional goals at this time with increased ROM and strength. Goals were updated a few sessions ago so not updated again today. Patient is progressing well as he able to get more motion as end range is further. He reports using his RUE with more activities. He continues to demonstrate significant weakness in R shoulder ER resulting in compensatory strategies with overhead AROM.  He is encouraged to continue HEP and follow-up as scheduled. He will benefit from PT services to address deficits in R shoulder strength in order to improve function at home.  PT Short Term Goals - 06/25/20 1419      PT SHORT TERM GOAL #1   Title Patient will be independent in home exercise program to improve strength/mobility for better functional independence with ADLs.    Baseline Patient is not able to reach behind his back to wash it.; 06/03/20: Patient reports doing some of his exercises at home.    Time 4    Period Weeks    Status Partially Met    Target Date 07/22/20      PT SHORT TERM GOAL #2   Title Patient will increase BLE gross strength to 4+/5 as to improve functional strength for independent gait, increased standing tolerance and increased ADL ability.    Baseline 04/29/20= -3/5 strength R shoulder flex/abd/ER/IR; 06/03/20: 3/5  strength of shoulder flex/abd/ER, 2/5 ER/IR, unable to resist weight at this time    Time 4    Period Weeks    Status On-going    Target Date 07/22/20             PT Long Term Goals - 06/25/20 1419      PT LONG TERM GOAL #1   Title Patient will report a worst pain of 3/10 on VAS in R shoulder to improve tolerance with ADLs and reduced symptoms with activities.    Baseline 04/29/20= 4/10 right shoulder; 06/03/20: 2/10 right shoulder    Time 8    Period Weeks    Status Achieved      PT LONG TERM GOAL #2   Title Patient will improve shoulder AROM to > 135 degrees of flexion, scaption, and abduction for improved ability to perform overhead activities, ER30 active, IR functionally to L 1.    Baseline 06/03/20: Flexion: 113, Scaption 94, Abduction: 94    Time 8    Period Weeks    Status On-going    Target Date 08/19/20      PT LONG TERM GOAL #3   Title Patient will improve shoulder elevation and ER in scapular plane  PROM to > 140 degrees of flexion, ER 40 deg passively    Baseline 04/29/20= see evaluation; 06/03/20: Scaption: 151; ER: 59    Time 8    Period Weeks    Status Achieved      PT LONG TERM GOAL #4   Title Patient will improve his quick dash score and FOTO score to indicate functional improvements    Baseline 6/23: quick dash score = 70.45, FOTO: 44 ; 06/03/20: Quick dash: 47.7, FOTO: 48    Time 8    Period Weeks    Status On-going    Target Date 08/19/20                 Plan - 06/24/20 1527    Clinical Impression Statement Patient is demonstrating excellent progression towards functional goals at this time with increased ROM and strength. Goals were updated a few sessions ago so not updated again today. Patient is progressing well as he able to get more motion as end range is further. He reports using his RUE with more activities. He continues to demonstrate significant weakness in R shoulder ER resulting in compensatory strategies with overhead AROM.  He is  encouraged to continue HEP and follow-up as scheduled. He will benefit from PT services to address deficits in R shoulder strength in order to improve function at home.    Personal Factors and Comorbidities Age    Examination-Activity Limitations Bed Mobility;Locomotion Level;Reach Overhead;Bathing;Carry;Lift;Stand    Examination-Participation  Restrictions Community Activity;Cleaning;Church;Driving;Meal Prep;Yard Work    Stability/Clinical Decision Making Stable/Uncomplicated    Clinical Decision Making Moderate    Rehab Potential Good    PT Frequency 2x / week    PT Duration 8 weeks    PT Treatment/Interventions Joint Manipulations;Dry needling;Passive range of motion;Cryotherapy;Electrical Stimulation;Moist Heat;Ultrasound;Therapeutic activities;Functional mobility training;Therapeutic exercise;Patient/family education;Manual techniques    PT Next Visit Plan Continue strengthening shoulder and back musculature, ROM    PT Home Exercise Plan Mebridge Access Code: [email or web address]    Consulted and Agree with Plan of Care Patient           Patient will benefit from skilled therapeutic intervention in order to improve the following deficits and impairments:  Decreased range of motion, Decreased strength, Impaired flexibility, Decreased safety awareness, Decreased mobility, Pain, Decreased activity tolerance  Visit Diagnosis: Muscle weakness (generalized)  Stiffness of right shoulder, not elsewhere classified  Chronic right shoulder pain     Problem List Patient Active Problem List   Diagnosis Date Noted  . Arthritis of left shoulder region 05/08/2019  . Abnormal glucose 12/05/2018  . Essential hypertension, benign 09/15/2018  . Dyslipidemia 09/15/2018  . Abnormal stress test 07/20/2018  . Coronary artery disease involving native coronary artery of native heart 07/20/2018  . CKD (chronic kidney disease) stage 3, GFR 30-59 ml/min 07/13/2018  . Erectile dysfunction 07/13/2018  . Insomnia  07/13/2018  . Hypogonadism in male 06/18/2018  . Testicular cancer ([place]) 06/13/2018  . Hyperlipidemia 06/13/2018  . Depression 06/13/2018  . Anxiety 06/13/2018  . Callus of foot 03/27/2018  . Pes planovalgus, acquired, right 01/30/2018  . Arthritis of midfoot 01/30/2018  . Achilles tendon contracture, right 01/30/2018  . Left shoulder pain 07/26/2017  . Infection of prosthetic left knee joint ([place]) 07/10/2017  . 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[place]) 05/15/2017  . GERD (gastroesophageal reflux disease) 03/20/2017  . Primary osteoarthritis of one knee, right 12/30/2016  . H/O total knee replacement, left 12/30/2016  . Moderate recurrent major depression ([place]) 08/15/2016  . Chest pain due to myocardial ischemia 08/14/2016  . Chest pain, rule out acute myocardial infarction 08/14/2016  . Chest x-ray abnormality 10/17/2013  . Scoliosis 07/11/2012  . Arthritis of wrist, left, degenerative 07/11/2012  . Parkinson's disease ([place]) 01/07/2012    This entire session was performed under direct supervision and direction of a licensed therapist/therapist assistant . I have personally read, edited and approve of the note as written.   Noemi Chapel, SPT Phillips Grout PT, DPT, GCS  Phillips Grout PT, DPT, GCS  Huprich,Jason 06/25/2020, 3:43 PM  [place] MAIN [facility] SERVICES [address] [place], [place], [address] Phone: [phone]   Fax:  [phone]  Name: Charles Choi MRN: [id number] Date of Birth: November 02, 1951

## 2020-06-25 ENCOUNTER — Other Ambulatory Visit: Payer: Self-pay | Admitting: Adult Health

## 2020-06-29 ENCOUNTER — Ambulatory Visit: Payer: Self-pay

## 2020-06-30 ENCOUNTER — Ambulatory Visit: Payer: Medicare Other

## 2020-06-30 ENCOUNTER — Other Ambulatory Visit: Payer: Self-pay

## 2020-06-30 DIAGNOSIS — M6281 Muscle weakness (generalized): Secondary | ICD-10-CM | POA: Diagnosis not present

## 2020-06-30 DIAGNOSIS — M25611 Stiffness of right shoulder, not elsewhere classified: Secondary | ICD-10-CM

## 2020-06-30 DIAGNOSIS — G8929 Other chronic pain: Secondary | ICD-10-CM

## 2020-06-30 NOTE — Therapy (Signed)
[place] MAIN [facility] SERVICES [address] [place], [place], [address] Phone: [phone]   Fax:  [phone]  Physical Therapy Treatment  Patient Details  Name: Charles Choi MRN: [id number] Date of Birth: [birth year] Referring Provider (PT): gage, mark   Encounter Date: 06/30/2020   PT End of Session - 06/30/20 1656    Visit Number 14    Number of Visits 33    Date for PT Re-Evaluation 08/19/20    Authorization Type Eval: 6/23; Progress Note: 7/28; Re-certification: 3/26;    PT Start Time 1648    PT Stop Time 1730    PT Time Calculation (min) 42 min    Activity Tolerance Patient tolerated treatment well    Behavior During Therapy WFL for tasks assessed/performed           Past Medical History:  Diagnosis Date   Anxiety    Cancer ([place])    Depression    Hypertension    Parkinson's disease ([place])     Past Surgical History:  Procedure Laterality Date   BACK SURGERY     CHOLECYSTECTOMY     DEEP BRAIN STIMULATOR PLACEMENT     for Parkinson's disease   JOINT REPLACEMENT     left knee x2   ORCHIECTOMY  1983    There were no vitals filed for this visit.   Subjective Assessment - 06/30/20 1656    Subjective Pt doing well this date and no new updates. No other problems or concerns with the shoulder. He notes that he's been sleeping with this arm above his head. No resting shoulder pain upon arrival.    Pertinent History Patient had rTSA 08/27/19, Patient sustained 2 dislocations (Dec 20, Jan 20). In February, pt underwent Rt rTSA. Pt worked with [place] after surgery, then transitioned to OPPT for resuming rehab.    Currently in Pain? No/denies            TREATMENT     Ther-ex  R shoulder PROM for flexion, scaption, and ER within pain-free range, never pushing through resistance Hooklying extension (towel behind arm), flexion, abduction, ER, and IR isometrics 5s hold x 10 reps each; Hookyling serratus punches 2 x  10, holding for 5 secs, cueing to keep arm straight; Seated BUE biceps curl 2# dumbbell 2 x 10, elbow wants to come out but not as much as other sessions, used tactile cues to make sure arm was in proper position; Rows to neutral 2 x 10, cued to move shoulder blades and not elbows Towel squeezes for shoulder adduction 2 x 10 Seated long lever R shoulder flexion (straight sagittal place) 2 x 10, cued for proper technique and to keep arm position straight; Seated long lever R shoulder in scaption 2 x 10; Seated short lever R shoulder in abduction 2 x 10 Functional reaching of IR behind back x 10, making sure to just naturally bring it behind his back, not pushing through resistance.   Pt educated throughout session about proper posture and technique with exercises. Improved exercise technique, movement at target joints, use of target muscles after min to mod verbal, visual, tactile cues.   Patient is demonstrating excellent motivation today. Session focused on strengthening exercises of R UE and back musculature. His mechanics today was much better with strengthening exercises. There was less shoulder shrugging with shoulder flexion and abduction. Patient is progressing well as he able to get more motion as end range is further. Rest breaks are getting shorter than  previous sessions, showing increased strength and endurance. He reports using his RUE with more activities like sleeping with his arm above his head. He continues to demonstrate significant weakness in R shoulder ER resulting in compensatory strategies with overhead AROM.  He is encouraged to continue HEP and follow-up as scheduled. He will benefit from PT services to address deficits in R shoulder strength in order to improve function at home.      PT Short Term Goals - 06/25/20 1419      PT SHORT TERM GOAL #1   Title Patient will be independent in home exercise program to improve strength/mobility for better functional independence with  ADLs.    Baseline Patient is not able to reach behind his back to wash it.; 06/03/20: Patient reports doing some of his exercises at home.    Time 4    Period Weeks    Status Partially Met    Target Date 07/22/20      PT SHORT TERM GOAL #2   Title Patient will increase BLE gross strength to 4+/5 as to improve functional strength for independent gait, increased standing tolerance and increased ADL ability.    Baseline 04/29/20= -3/5 strength R shoulder flex/abd/ER/IR; 06/03/20: 3/5 strength of shoulder flex/abd/ER, 2/5 ER/IR, unable to resist weight at this time    Time 4    Period Weeks    Status On-going    Target Date 07/22/20             PT Long Term Goals - 06/25/20 1419      PT LONG TERM GOAL #1   Title Patient will report a worst pain of 3/10 on VAS in R shoulder to improve tolerance with ADLs and reduced symptoms with activities.    Baseline 04/29/20= 4/10 right shoulder; 06/03/20: 2/10 right shoulder    Time 8    Period Weeks    Status Achieved      PT LONG TERM GOAL #2   Title Patient will improve shoulder AROM to > 135 degrees of flexion, scaption, and abduction for improved ability to perform overhead activities, ER30 active, IR functionally to L 1.    Baseline 06/03/20: Flexion: 113, Scaption 94, Abduction: 94    Time 8    Period Weeks    Status On-going    Target Date 08/19/20      PT LONG TERM GOAL #3   Title Patient will improve shoulder elevation and ER in scapular plane  PROM to > 140 degrees of flexion, ER 40 deg passively    Baseline 04/29/20= see evaluation; 06/03/20: Scaption: 151; ER: 59    Time 8    Period Weeks    Status Achieved      PT LONG TERM GOAL #4   Title Patient will improve his quick dash score and FOTO score to indicate functional improvements    Baseline 6/23: quick dash score = 70.45, FOTO: 44 ; 06/03/20: Quick dash: 47.7, FOTO: 48    Time 8    Period Weeks    Status On-going    Target Date 08/19/20                 Plan -  06/30/20 1657    Clinical Impression Statement Patient is demonstrating excellent motivation today. Session focused on strengthening exercises of R UE and back musculature. His mechanics today was much better with strengthening exercises. There was less shoulder shrugging with shoulder flexion and abduction. Patient is progressing well as he able to get  more motion as end range is further. Rest breaks are getting shorter than previous sessions, showing increased strength and endurance. He reports using his RUE with more activities like sleeping with his arm above his head. He continues to demonstrate significant weakness in R shoulder ER resulting in compensatory strategies with overhead AROM.  He is encouraged to continue HEP and follow-up as scheduled. He will benefit from PT services to address deficits in R shoulder strength in order to improve function at home.    Personal Factors and Comorbidities Age    Examination-Activity Limitations Bed Mobility;Locomotion Level;Reach Overhead;Bathing;Carry;Lift;Stand    Examination-Participation Restrictions Community Activity;Cleaning;Church;Driving;Meal Prep;Yard Work    Stability/Clinical Decision Making Stable/Uncomplicated    Rehab Potential Good    PT Frequency 2x / week    PT Duration 8 weeks    PT Treatment/Interventions Joint Manipulations;Dry needling;Passive range of motion;Cryotherapy;Electrical Stimulation;Moist Heat;Ultrasound;Therapeutic activities;Functional mobility training;Therapeutic exercise;Patient/family education;Manual techniques    PT Next Visit Plan Continue strengthening shoulder and back musculature, ROM    PT Home Exercise Plan Mebridge Access Code: [email or web address]    Consulted and Agree with Plan of Care Patient           Patient will benefit from skilled therapeutic intervention in order to improve the following deficits and impairments:  Decreased range of motion, Decreased strength, Impaired flexibility, Decreased safety  awareness, Decreased mobility, Pain, Decreased activity tolerance  Visit Diagnosis: Muscle weakness (generalized)  Stiffness of right shoulder, not elsewhere classified  Chronic right shoulder pain     Problem List Patient Active Problem List   Diagnosis Date Noted   Arthritis of left shoulder region 05/08/2019   Abnormal glucose 12/05/2018   Essential hypertension, benign 09/15/2018   Dyslipidemia 09/15/2018   Abnormal stress test 07/20/2018   Coronary artery disease involving native coronary artery of native heart 07/20/2018   CKD (chronic kidney disease) stage 3, GFR 30-59 ml/min 07/13/2018   Erectile dysfunction 07/13/2018   Insomnia 07/13/2018   Hypogonadism in male 06/18/2018   Testicular cancer ([place]) 06/13/2018   Hyperlipidemia 06/13/2018   Depression 06/13/2018   Anxiety 06/13/2018   Callus of foot 03/27/2018   Pes planovalgus, acquired, right 01/30/2018   Arthritis of midfoot 01/30/2018   Achilles tendon contracture, right 01/30/2018   Left shoulder pain 07/26/2017   Infection of prosthetic left knee joint ([place]) 07/10/2017   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[place]) 05/15/2017   GERD (gastroesophageal reflux disease) 03/20/2017   Primary osteoarthritis of one knee, right 12/30/2016   H/O total knee replacement, left 12/30/2016   Moderate recurrent major depression ([place]) 08/15/2016   Chest pain due to myocardial ischemia 08/14/2016   Chest pain, rule out acute myocardial infarction 08/14/2016   Chest x-ray abnormality 10/17/2013   Scoliosis 07/11/2012   Arthritis of wrist, left, degenerative 07/11/2012   Parkinson's disease ([place]) 01/07/2012   This entire session was performed under direct supervision and direction of a licensed therapist/therapist assistant . I have personally read, edited and approve of the note as written.   Noemi Chapel, SPT Phillips Grout PT, DPT, GCS  Huprich,Jason 07/01/2020, 3:42 PM  [place] MAIN [facility] SERVICES [address] [place], [place], [address] Phone: [phone]   Fax:  [phone]  Name: GIORGI DEBRUIN MRN: [id number] Date of Birth: 30-Aug-1951

## 2020-07-01 ENCOUNTER — Other Ambulatory Visit: Payer: Self-pay

## 2020-07-01 ENCOUNTER — Ambulatory Visit (INDEPENDENT_AMBULATORY_CARE_PROVIDER_SITE_OTHER): Payer: Medicare Other

## 2020-07-01 DIAGNOSIS — E291 Testicular hypofunction: Secondary | ICD-10-CM | POA: Diagnosis not present

## 2020-07-01 MED ORDER — TESTOSTERONE CYPIONATE 200 MG/ML IM SOLN
240.0000 mg | Freq: Once | INTRAMUSCULAR | Status: AC
  Administered 2020-07-01: 240 mg via INTRAMUSCULAR

## 2020-07-01 NOTE — Progress Notes (Signed)
Testosterone IM Injection  Due to Hypogonadism patient is present today for a Testosterone Injection.  Medication: Testosterone Cypionate Dose: 1.77ml Location: right upper outer buttocks Lot: [id number] Exp:12/2020  Patient tolerated well, no complications were noted  Preformed by: Fonnie Jarvis, CMA  Follow up: 2wk injection

## 2020-07-02 ENCOUNTER — Ambulatory Visit: Payer: Medicare Other

## 2020-07-02 ENCOUNTER — Other Ambulatory Visit: Payer: Self-pay

## 2020-07-02 DIAGNOSIS — M6281 Muscle weakness (generalized): Secondary | ICD-10-CM

## 2020-07-02 DIAGNOSIS — M25611 Stiffness of right shoulder, not elsewhere classified: Secondary | ICD-10-CM

## 2020-07-02 DIAGNOSIS — G8929 Other chronic pain: Secondary | ICD-10-CM

## 2020-07-02 NOTE — Therapy (Signed)
[place] MAIN [facility] SERVICES [address] [place], [place], [address] Phone: [phone]   Fax:  [phone]  Physical Therapy Treatment  Patient Details  Name: Charles Choi MRN: [id number] Date of Birth: Jan 16, 1951 Referring Provider (PT): gage, mark   Encounter Date: 07/02/2020   PT End of Session - 07/02/20 1728    Visit Number 15    Number of Visits 33    Date for PT Re-Evaluation 08/19/20    Authorization Type Eval: 6/23; Progress Note: 7/28; Re-certification: 8/92;    PT Start Time 1648    PT Stop Time 1730    PT Time Calculation (min) 42 min    Activity Tolerance Patient tolerated treatment well    Behavior During Therapy WFL for tasks assessed/performed           Past Medical History:  Diagnosis Date  . Anxiety   . Cancer ([place])   . Depression   . Hypertension   . Parkinson's disease [facility])     Past Surgical History:  Procedure Laterality Date  . BACK SURGERY    . CHOLECYSTECTOMY    . DEEP BRAIN STIMULATOR PLACEMENT     for Parkinson's disease  . JOINT REPLACEMENT     left knee x2  . ORCHIECTOMY  1983    There were no vitals filed for this visit.   Subjective Assessment - 07/02/20 1705    Subjective Pt doing well this date and no new updates. No other problems or concerns with the shoulder. He notes that he's been sleeping with this arm above his head. No resting shoulder pain upon arrival.    Pertinent History Patient had rTSA 08/27/19, Patient sustained 2 dislocations (Dec '20, Jan '20). In February, pt underwent Rt rTSA. Pt worked with [place] after surgery, then transitioned to OPPT for resuming rehab.    Currently in Pain? No/denies            TREATMENT     Ther-ex  R shoulder PROM for flexion, scaption, and ER within pain-free range, never pushing through resistance Hooklying extension (towel behind arm), flexion, abduction, ER, and IR isometrics 5s hold x 10 reps each; Hookyling serratus punches with  10 seconds perturbations x 10 each, cueing to keep arm straight; Seated BUE biceps curl 2# dumbbell 2 x 10, elbow wants to come out but not as much as other sessions, used towel to make sure arm was in proper position; Rows to neutral 2 x 10, cued to move shoulder blades and not elbows Seated long lever R shoulder flexion (straight sagittal place) 2 x 10, cued for proper technique and to keep arm position straight; Seated long lever R shoulder in scaption 2 x 10; Seated short lever R shoulder in abduction 2 x 10 Functional reaching of IR behind back x 10, making sure to just naturally bring it behind his back, not pushing through resistance.   Pt educated throughout session about proper posture and technique with exercises. Improved exercise technique, movement at target joints, use of target muscles after min to mod verbal, visual, tactile cues.   Patient is demonstrating excellent motivation today. Session focused on strengthening exercises of R UE and back musculature. His mechanics today was much better with strengthening exercises as there was less shoulder shrugging with shoulder flexion and abduction. Added perturbations with serratus anterior punches for proprioceptive feedback and strengthening. Patient is progressing well as he able to get more motion as end range is further. Rest breaks are  getting shorter than previous sessions, showing increased strength and endurance. He reports using his RUE with more activities like sleeping with his arm above his head. He continues to demonstrate significant weakness in R shoulder ER resulting in compensatory strategies with overhead AROM. He is encouraged to continue HEP and follow-up as scheduled. He will benefit from PT services to address deficits in R shoulder strength in order to improve function at home.      PT Short Term Goals - 06/25/20 1419      PT SHORT TERM GOAL #1   Title Patient will be independent in home exercise program to improve  strength/mobility for better functional independence with ADLs.    Baseline Patient is not able to reach behind his back to wash it.; 06/03/20: Patient reports doing some of his exercises at home.    Time 4    Period Weeks    Status Partially Met    Target Date 07/22/20      PT SHORT TERM GOAL #2   Title Patient will increase BLE gross strength to 4+/5 as to improve functional strength for independent gait, increased standing tolerance and increased ADL ability.    Baseline 04/29/20= -3/5 strength R shoulder flex/abd/ER/IR; 06/03/20: 3/5 strength of shoulder flex/abd/ER, 2/5 ER/IR, unable to resist weight at this time    Time 4    Period Weeks    Status On-going    Target Date 07/22/20             PT Long Term Goals - 06/25/20 1419      PT LONG TERM GOAL #1   Title Patient will report a worst pain of 3/10 on VAS in R shoulder to improve tolerance with ADLs and reduced symptoms with activities.    Baseline 04/29/20= 4/10 right shoulder; 06/03/20: 2/10 right shoulder    Time 8    Period Weeks    Status Achieved      PT LONG TERM GOAL #2   Title Patient will improve shoulder AROM to > 135 degrees of flexion, scaption, and abduction for improved ability to perform overhead activities, ER30 active, IR functionally to L 1.    Baseline 06/03/20: Flexion: 113, Scaption 94, Abduction: 94    Time 8    Period Weeks    Status On-going    Target Date 08/19/20      PT LONG TERM GOAL #3   Title Patient will improve shoulder elevation and ER in scapular plane  PROM to > 140 degrees of flexion, ER 40 deg passively    Baseline 04/29/20= see evaluation; 06/03/20: Scaption: 151; ER: 59    Time 8    Period Weeks    Status Achieved      PT LONG TERM GOAL #4   Title Patient will improve his quick dash score and FOTO score to indicate functional improvements    Baseline 6/23: quick dash score = 70.45, FOTO: 44 ; 06/03/20: Quick dash: 47.7, FOTO: 48    Time 8    Period Weeks    Status On-going     Target Date 08/19/20                 Plan - 07/02/20 1737    Clinical Impression Statement Patient is demonstrating excellent motivation today. Session focused on strengthening exercises of R UE and back musculature. His mechanics today was much better with strengthening exercises as there was less shoulder shrugging with shoulder flexion and abduction. Added perturbations with serratus anterior punches  for proprioceptive feedback and strengthening. Patient is progressing well as he able to get more motion as end range is further. Rest breaks are getting shorter than previous sessions, showing increased strength and endurance. He reports using his RUE with more activities like sleeping with his arm above his head. He continues to demonstrate significant weakness in R shoulder ER resulting in compensatory strategies with overhead AROM. He is encouraged to continue HEP and follow-up as scheduled. He will benefit from PT services to address deficits in R shoulder strength in order to improve function at home.    Personal Factors and Comorbidities Age    Examination-Activity Limitations Bed Mobility;Locomotion Level;Reach Overhead;Bathing;Carry;Lift;Stand    Examination-Participation Restrictions Community Activity;Cleaning;Church;Driving;Meal Prep;Yard Work    Stability/Clinical Decision Making Stable/Uncomplicated    Rehab Potential Good    PT Frequency 2x / week    PT Duration 8 weeks    PT Treatment/Interventions Joint Manipulations;Dry needling;Passive range of motion;Cryotherapy;Electrical Stimulation;Moist Heat;Ultrasound;Therapeutic activities;Functional mobility training;Therapeutic exercise;Patient/family education;Manual techniques    PT Next Visit Plan Continue strengthening shoulder and back musculature, ROM    PT Home Exercise Plan Mebridge Access Code: [email or web address]    Consulted and Agree with Plan of Care Patient           Patient will benefit from skilled therapeutic intervention  in order to improve the following deficits and impairments:  Decreased range of motion, Decreased strength, Impaired flexibility, Decreased safety awareness, Decreased mobility, Pain, Decreased activity tolerance  Visit Diagnosis: Muscle weakness (generalized)  Stiffness of right shoulder, not elsewhere classified  Chronic right shoulder pain     Problem List Patient Active Problem List   Diagnosis Date Noted  . Arthritis of left shoulder region 05/08/2019  . Abnormal glucose 12/05/2018  . Essential hypertension, benign 09/15/2018  . Dyslipidemia 09/15/2018  . Abnormal stress test 07/20/2018  . Coronary artery disease involving native coronary artery of native heart 07/20/2018  . CKD (chronic kidney disease) stage 3, GFR 30-59 ml/min 07/13/2018  . Erectile dysfunction 07/13/2018  . Insomnia 07/13/2018  . Hypogonadism in male 06/18/2018  . Testicular cancer ([place]) 06/13/2018  . Hyperlipidemia 06/13/2018  . Depression 06/13/2018  . Anxiety 06/13/2018  . Callus of foot 03/27/2018  . Pes planovalgus, acquired, right 01/30/2018  . Arthritis of midfoot 01/30/2018  . Achilles tendon contracture, right 01/30/2018  . Left shoulder pain 07/26/2017  . Infection of prosthetic left knee joint ([place]) 07/10/2017  . 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[place]) 05/15/2017  . GERD (gastroesophageal reflux disease) 03/20/2017  . Primary osteoarthritis of one knee, right 12/30/2016  . H/O total knee replacement, left 12/30/2016  . Moderate recurrent major depression ([place]) 08/15/2016  . Chest pain due to myocardial ischemia 08/14/2016  . Chest pain, rule out acute myocardial infarction 08/14/2016  . Chest x-ray abnormality 10/17/2013  . Scoliosis 07/11/2012  . Arthritis of wrist, left, degenerative 07/11/2012  . Parkinson's disease ([place]) 01/07/2012    This entire session was performed under direct supervision and direction of a licensed therapist/therapist assistant . I have personally read,  edited and approve of the note as written.   Noemi Chapel, SPT Phillips Grout PT, DPT, GCS  Huprich,Jason 07/03/2020, 10:40 AM  [place] MAIN [facility] SERVICES [address] [place], [place], [address] Phone: [phone]   Fax:  [phone]  Name: Charles Choi MRN: [id number] Date of Birth: 31-Jan-1951

## 2020-07-07 ENCOUNTER — Other Ambulatory Visit: Payer: Self-pay

## 2020-07-07 ENCOUNTER — Ambulatory Visit: Payer: Medicare Other

## 2020-07-07 DIAGNOSIS — M25611 Stiffness of right shoulder, not elsewhere classified: Secondary | ICD-10-CM

## 2020-07-07 DIAGNOSIS — M6281 Muscle weakness (generalized): Secondary | ICD-10-CM | POA: Diagnosis not present

## 2020-07-07 NOTE — Therapy (Signed)
[place] MAIN [facility] SERVICES [address] [place], [place], [address] Phone: [phone]   Fax:  [phone]  Physical Therapy Treatment  Patient Details  Name: Charles Choi MRN: [id number] Date of Birth: 24-Sep-1951 Referring Provider (PT): gage, mark   Encounter Date: 07/07/2020   PT End of Session - 07/07/20 1653    Visit Number 16    Number of Visits 33    Date for PT Re-Evaluation 08/19/20    Authorization Type Eval: 6/23; Progress Note: 7/28; Re-certification: 9/29;    PT Start Time 1648    PT Stop Time 1730    PT Time Calculation (min) 42 min    Activity Tolerance Patient tolerated treatment well    Behavior During Therapy WFL for tasks assessed/performed           Past Medical History:  Diagnosis Date  . Anxiety   . Cancer ([place])   . Depression   . Hypertension   . Parkinson's disease [facility])     Past Surgical History:  Procedure Laterality Date  . BACK SURGERY    . CHOLECYSTECTOMY    . DEEP BRAIN STIMULATOR PLACEMENT     for Parkinson's disease  . JOINT REPLACEMENT     left knee x2  . ORCHIECTOMY  1983    There were no vitals filed for this visit.   Subjective Assessment - 07/07/20 1804    Subjective Pt doing well this date and no new updates. No other problems or concerns with the shoulder. No resting shoulder pain upon arrival.    Pertinent History Patient had rTSA 08/27/19, Patient sustained 2 dislocations (Dec '20, Jan '20). In February, pt underwent Rt rTSA. Pt worked with [place] after surgery, then transitioned to OPPT for resuming rehab.    Currently in Pain? No/denies             TREATMENT     Ther-ex  R shoulder AROM for flexion and scaption within pain-free range, never pushing through resistance Supine extension (towel behind arm), ER, and IR isometrics 5s hold x 10 reps each; Supine serratus punches with manual resistance from therapist 2 x 10; Supine dynamic perturbations with shoulder at 90  flexion and resistance at wrist 2 x 30s; Seated BUE biceps curl 2# dumbbell 2 x 10, verbal and tactile cues to keep arm from moving into internal rotation; Seated long lever R shoulder flexion (straight sagittal place) 2 x 10, cued for proper technique and to keep arm position straight, noted improvement in strength and motor control; Seated long lever R shoulder in scaption 2 x 10; Rows to neutral with red tband 2 x 10, cued to move shoulder blades and not elbows; R shoulder red tband resisted extension to neutral 2 x 10; Functional reaching of IR behind back x 10, making sure to just naturally bring it behind his back, not pushing through resistance. Seated ball rolls on wall in CW/CCW direction 2 x 10 each; Supine chest press with PVC pipe and light manual resistance from therapist x 10;    Pt educated throughout session about proper posture and technique with exercises. Improved exercise technique, movement at target joints, use of target muscles after min to mod verbal, visual, tactile cues.    Patient is demonstrating excellent motivation today. Session focused on strengthening exercises of R UE and back musculature. His mechanics today was much better with strengthening exercises as there was less shoulder shrugging with shoulder flexion and abduction. Continued perturbations with serratus  anterior punches for proprioceptive feedback and strengthening. Added ball rolls on wall and chest press. Patient is progressing well as he able to get more motion as end range is further. Rest breaks are getting shorter than previous sessions, showing increased strength and endurance. He is encouraged to continue HEP and follow-up as scheduled. He will benefit from PT services to address deficits in R shoulder strength in order to improve function at home.                           PT Short Term Goals - 06/25/20 1419      PT SHORT TERM GOAL #1   Title Patient will be independent  in home exercise program to improve strength/mobility for better functional independence with ADLs.    Baseline Patient is not able to reach behind his back to wash it.; 06/03/20: Patient reports doing some of his exercises at home.    Time 4    Period Weeks    Status Partially Met    Target Date 07/22/20      PT SHORT TERM GOAL #2   Title Patient will increase BLE gross strength to 4+/5 as to improve functional strength for independent gait, increased standing tolerance and increased ADL ability.    Baseline 04/29/20= -3/5 strength R shoulder flex/abd/ER/IR; 06/03/20: 3/5 strength of shoulder flex/abd/ER, 2/5 ER/IR, unable to resist weight at this time    Time 4    Period Weeks    Status On-going    Target Date 07/22/20             PT Long Term Goals - 06/25/20 1419      PT LONG TERM GOAL #1   Title Patient will report a worst pain of 3/10 on VAS in R shoulder to improve tolerance with ADLs and reduced symptoms with activities.    Baseline 04/29/20= 4/10 right shoulder; 06/03/20: 2/10 right shoulder    Time 8    Period Weeks    Status Achieved      PT LONG TERM GOAL #2   Title Patient will improve shoulder AROM to > 135 degrees of flexion, scaption, and abduction for improved ability to perform overhead activities, ER30 active, IR functionally to L 1.    Baseline 06/03/20: Flexion: 113, Scaption 94, Abduction: 94    Time 8    Period Weeks    Status On-going    Target Date 08/19/20      PT LONG TERM GOAL #3   Title Patient will improve shoulder elevation and ER in scapular plane  PROM to > 140 degrees of flexion, ER 40 deg passively    Baseline 04/29/20= see evaluation; 06/03/20: Scaption: 151; ER: 59    Time 8    Period Weeks    Status Achieved      PT LONG TERM GOAL #4   Title Patient will improve his quick dash score and FOTO score to indicate functional improvements    Baseline 6/23: quick dash score = 70.45, FOTO: 44 ; 06/03/20: Quick dash: 47.7, FOTO: 48    Time 8     Period Weeks    Status On-going    Target Date 08/19/20                 Plan - 07/07/20 1714    Clinical Impression Statement Patient is demonstrating excellent motivation today. Session focused on strengthening exercises of R UE and back musculature. His mechanics today was  much better with strengthening exercises as there was less shoulder shrugging with shoulder flexion and abduction. Continued perturbations with serratus anterior punches for proprioceptive feedback and strengthening. Added ball rolls on wall and chest press. Patient is progressing well as he able to get more motion as end range is further. Rest breaks are getting shorter than previous sessions, showing increased strength and endurance. He is encouraged to continue HEP and follow-up as scheduled. He will benefit from PT services to address deficits in R shoulder strength in order to improve function at home.    Personal Factors and Comorbidities Age    Examination-Activity Limitations Bed Mobility;Locomotion Level;Reach Overhead;Bathing;Carry;Lift;Stand    Examination-Participation Restrictions Community Activity;Cleaning;Church;Driving;Meal Prep;Yard Work    Stability/Clinical Decision Making Stable/Uncomplicated    Rehab Potential Good    PT Frequency 2x / week    PT Duration 8 weeks    PT Treatment/Interventions Joint Manipulations;Dry needling;Passive range of motion;Cryotherapy;Electrical Stimulation;Moist Heat;Ultrasound;Therapeutic activities;Functional mobility training;Therapeutic exercise;Patient/family education;Manual techniques    PT Next Visit Plan Continue strengthening shoulder and back musculature, ROM    PT Home Exercise Plan Mebridge Access Code: [email or web address]    Consulted and Agree with Plan of Care Patient           Patient will benefit from skilled therapeutic intervention in order to improve the following deficits and impairments:  Decreased range of motion, Decreased strength, Impaired  flexibility, Decreased safety awareness, Decreased mobility, Pain, Decreased activity tolerance  Visit Diagnosis: Muscle weakness (generalized)  Stiffness of right shoulder, not elsewhere classified     Problem List Patient Active Problem List   Diagnosis Date Noted  . Arthritis of left shoulder region 05/08/2019  . Abnormal glucose 12/05/2018  . Essential hypertension, benign 09/15/2018  . Dyslipidemia 09/15/2018  . Abnormal stress test 07/20/2018  . Coronary artery disease involving native coronary artery of native heart 07/20/2018  . CKD (chronic kidney disease) stage 3, GFR 30-59 ml/min 07/13/2018  . Erectile dysfunction 07/13/2018  . Insomnia 07/13/2018  . Hypogonadism in male 06/18/2018  . Testicular cancer ([place]) 06/13/2018  . Hyperlipidemia 06/13/2018  . Depression 06/13/2018  . Anxiety 06/13/2018  . Callus of foot 03/27/2018  . Pes planovalgus, acquired, right 01/30/2018  . Arthritis of midfoot 01/30/2018  . Achilles tendon contracture, right 01/30/2018  . Left shoulder pain 07/26/2017  . Infection of prosthetic left knee joint ([place]) 07/10/2017  . 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[place]) 05/15/2017  . GERD (gastroesophageal reflux disease) 03/20/2017  . Primary osteoarthritis of one knee, right 12/30/2016  . H/O total knee replacement, left 12/30/2016  . Moderate recurrent major depression ([place]) 08/15/2016  . Chest pain due to myocardial ischemia 08/14/2016  . Chest pain, rule out acute myocardial infarction 08/14/2016  . Chest x-ray abnormality 10/17/2013  . Scoliosis 07/11/2012  . Arthritis of wrist, left, degenerative 07/11/2012  . Parkinson's disease ([place]) 01/07/2012    Phillips Grout PT, DPT, GCS  Collen Hostler 07/07/2020, 6:09 PM  [place] MAIN [facility] SERVICES [address] [place], [place], [address] Phone: [phone]   Fax:  [phone]  Name: AMADEO COKE MRN: [id number] Date of Birth: 08-Mar-1951

## 2020-07-14 ENCOUNTER — Ambulatory Visit: Payer: Medicare Other

## 2020-07-15 ENCOUNTER — Ambulatory Visit: Payer: Medicare Other | Attending: Orthopedic Surgery

## 2020-07-15 ENCOUNTER — Other Ambulatory Visit: Payer: Self-pay

## 2020-07-15 DIAGNOSIS — G8929 Other chronic pain: Secondary | ICD-10-CM | POA: Diagnosis present

## 2020-07-15 DIAGNOSIS — M25511 Pain in right shoulder: Secondary | ICD-10-CM | POA: Diagnosis present

## 2020-07-15 DIAGNOSIS — M25611 Stiffness of right shoulder, not elsewhere classified: Secondary | ICD-10-CM

## 2020-07-15 DIAGNOSIS — M6281 Muscle weakness (generalized): Secondary | ICD-10-CM

## 2020-07-15 NOTE — Therapy (Signed)
[place] MAIN [facility] SERVICES [address] [place], [place], [address] Phone: [phone]   Fax:  [phone]  Physical Therapy Treatment  Patient Details  Name: Charles Choi MRN: [id number] Date of Birth: Apr 06, 1951 Referring Provider (PT): gage, mark   Encounter Date: 07/15/2020   PT End of Session - 07/15/20 1304    Visit Number 17    Number of Visits 33    Date for PT Re-Evaluation 08/19/20    Authorization Type Eval: 6/23; Progress Note: 7/28; Re-certification: 8/50;    PT Start Time 1300    PT Stop Time 1345    PT Time Calculation (min) 45 min    Activity Tolerance Patient tolerated treatment well    Behavior During Therapy WFL for tasks assessed/performed           Past Medical History:  Diagnosis Date  . Anxiety   . Cancer ([place])   . Depression   . Hypertension   . Parkinson's disease [facility])     Past Surgical History:  Procedure Laterality Date  . BACK SURGERY    . CHOLECYSTECTOMY    . DEEP BRAIN STIMULATOR PLACEMENT     for Parkinson's disease  . JOINT REPLACEMENT     left knee x2  . ORCHIECTOMY  1983    There were no vitals filed for this visit.   Subjective Assessment - 07/15/20 1303    Subjective Pt doing well this date and no new updates. No other problems or concerns with the shoulder. No resting shoulder pain upon arrival.    Pertinent History Patient had rTSA 08/27/19, Patient sustained 2 dislocations (Dec '20, Jan '20). In February, pt underwent Rt rTSA. Pt worked with [facility] after surgery, then transitioned to OPPT for resuming rehab.    Currently in Pain? No/denies               TREATMENT     Ther-ex  Supine R shoulder PROM for flexion and scaption within pain-free range, never pushing through resistance Supine extension (towel behind arm), flexion, abduction, ER, and IR isometrics 5s hold x 10 reps each; Supine serratus punches with manual resistance from therapist 2 x 10; Supine dynamic  perturbations with shoulder at 90 flexion and resistance at wrist 2 x 30s; Semirecumbent RUE biceps curl with light manual resistance 2 x 10; Semirecumbent RUE tricep extension with light manual resistance 2 x 10; Seated long lever R shoulder flexion (straight sagittal place) 2 x 10, cued for proper technique and to keep arm position straight, noted improvement in strength and motor control; Seated long lever R shoulder in scaption 2 x 10; Rows to neutral with red tband 2 x 10, cued to move shoulder blades and not elbows; R shoulder red tband resisted extension to neutral 2 x 10; Functional reaching of IR behind back x 10, making sure to just naturally bring it behind his back, not pushing through resistance. Seated ball rolls on wall in CW/CCW direction x 10 each; Supine chest press with PVC pipe and light manual resistance from therapist  2x 10;    Pt educated throughout session about proper posture and technique with exercises. Improved exercise technique, movement at target joints, use of target muscles after min to mod verbal, visual, tactile cues.    Patient is demonstrating excellent motivation today. Session focused on strengthening exercises of R UE and back musculature. His mechanics today continue to improve with strengthening exercises as there was less shoulder shrugging with shoulder flexion  and abduction.   Endurance also appears improved.  Progressed to light manual resistance for right elbow flexion and extension strengthening.  Continued perturbations with serratus anterior punches for proprioceptive feedback and strengthening.  Continued ball rolls on wall and chest press. Rest breaks are getting shorter than previous sessions, showing improved endurance. He is encouraged to continue HEP and follow-up as scheduled. He will benefit from PT services to address deficits in R shoulder strength in order to improve function at home.                          PT  Short Term Goals - 06/25/20 1419      PT SHORT TERM GOAL #1   Title Patient will be independent in home exercise program to improve strength/mobility for better functional independence with ADLs.    Baseline Patient is not able to reach behind his back to wash it.; 06/03/20: Patient reports doing some of his exercises at home.    Time 4    Period Weeks    Status Partially Met    Target Date 07/22/20      PT SHORT TERM GOAL #2   Title Patient will increase BLE gross strength to 4+/5 as to improve functional strength for independent gait, increased standing tolerance and increased ADL ability.    Baseline 04/29/20= -3/5 strength R shoulder flex/abd/ER/IR; 06/03/20: 3/5 strength of shoulder flex/abd/ER, 2/5 ER/IR, unable to resist weight at this time    Time 4    Period Weeks    Status On-going    Target Date 07/22/20             PT Long Term Goals - 06/25/20 1419      PT LONG TERM GOAL #1   Title Patient will report a worst pain of 3/10 on VAS in R shoulder to improve tolerance with ADLs and reduced symptoms with activities.    Baseline 04/29/20= 4/10 right shoulder; 06/03/20: 2/10 right shoulder    Time 8    Period Weeks    Status Achieved      PT LONG TERM GOAL #2   Title Patient will improve shoulder AROM to > 135 degrees of flexion, scaption, and abduction for improved ability to perform overhead activities, ER30 active, IR functionally to L 1.    Baseline 06/03/20: Flexion: 113, Scaption 94, Abduction: 94    Time 8    Period Weeks    Status On-going    Target Date 08/19/20      PT LONG TERM GOAL #3   Title Patient will improve shoulder elevation and ER in scapular plane  PROM to > 140 degrees of flexion, ER 40 deg passively    Baseline 04/29/20= see evaluation; 06/03/20: Scaption: 151; ER: 59    Time 8    Period Weeks    Status Achieved      PT LONG TERM GOAL #4   Title Patient will improve his quick dash score and FOTO score to indicate functional improvements    Baseline  6/23: quick dash score = 70.45, FOTO: 44 ; 06/03/20: Quick dash: 47.7, FOTO: 48    Time 8    Period Weeks    Status On-going    Target Date 08/19/20                 Plan - 07/15/20 1304    Clinical Impression Statement Patient is demonstrating excellent motivation today. Session focused on strengthening exercises of R  UE and back musculature. His mechanics today continue to improve with strengthening exercises as there was less shoulder shrugging with shoulder flexion and abduction.   Endurance also appears improved.  Progressed to light manual resistance for right elbow flexion and extension strengthening.  Continued perturbations with serratus anterior punches for proprioceptive feedback and strengthening.  Continued ball rolls on wall and chest press. Rest breaks are getting shorter than previous sessions, showing improved endurance. He is encouraged to continue HEP and follow-up as scheduled. He will benefit from PT services to address deficits in R shoulder strength in order to improve function at home.    Personal Factors and Comorbidities Age    Examination-Activity Limitations Bed Mobility;Locomotion Level;Reach Overhead;Bathing;Carry;Lift;Stand    Examination-Participation Restrictions Community Activity;Cleaning;Church;Driving;Meal Prep;Yard Work    Stability/Clinical Decision Making Stable/Uncomplicated    Rehab Potential Good    PT Frequency 2x / week    PT Duration 8 weeks    PT Treatment/Interventions Joint Manipulations;Dry needling;Passive range of motion;Cryotherapy;Electrical Stimulation;Moist Heat;Ultrasound;Therapeutic activities;Functional mobility training;Therapeutic exercise;Patient/family education;Manual techniques    PT Next Visit Plan Continue strengthening shoulder and back musculature, ROM    PT Home Exercise Plan Mebridge Access Code: [email or web address]    Consulted and Agree with Plan of Care Patient           Patient will benefit from skilled therapeutic  intervention in order to improve the following deficits and impairments:  Decreased range of motion, Decreased strength, Impaired flexibility, Decreased safety awareness, Decreased mobility, Pain, Decreased activity tolerance  Visit Diagnosis: Muscle weakness (generalized)  Stiffness of right shoulder, not elsewhere classified     Problem List Patient Active Problem List   Diagnosis Date Noted  . Arthritis of left shoulder region 05/08/2019  . Abnormal glucose 12/05/2018  . Essential hypertension, benign 09/15/2018  . Dyslipidemia 09/15/2018  . Abnormal stress test 07/20/2018  . Coronary artery disease involving native coronary artery of native heart 07/20/2018  . CKD (chronic kidney disease) stage 3, GFR 30-59 ml/min 07/13/2018  . Erectile dysfunction 07/13/2018  . Insomnia 07/13/2018  . Hypogonadism in male 06/18/2018  . Testicular cancer ([place]) 06/13/2018  . Hyperlipidemia 06/13/2018  . Depression 06/13/2018  . Anxiety 06/13/2018  . Callus of foot 03/27/2018  . Pes planovalgus, acquired, right 01/30/2018  . Arthritis of midfoot 01/30/2018  . Achilles tendon contracture, right 01/30/2018  . Left shoulder pain 07/26/2017  . Infection of prosthetic left knee joint ([place]) 07/10/2017  . 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[place]) 05/15/2017  . GERD (gastroesophageal reflux disease) 03/20/2017  . Primary osteoarthritis of one knee, right 12/30/2016  . H/O total knee replacement, left 12/30/2016  . Moderate recurrent major depression ([place]) 08/15/2016  . Chest pain due to myocardial ischemia 08/14/2016  . Chest pain, rule out acute myocardial infarction 08/14/2016  . Chest x-ray abnormality 10/17/2013  . Scoliosis 07/11/2012  . Arthritis of wrist, left, degenerative 07/11/2012  . Parkinson's disease ([place]) 01/07/2012   Phillips Grout PT, DPT, GCS  Navi Ewton 07/15/2020, 2:33 PM  [place] MAIN [facility] SERVICES [address]  [place], [place], [address] Phone: [phone]   Fax:  [phone]  Name: NEAL TRULSON MRN: [id number] Date of Birth: August 23, 1951

## 2020-07-16 ENCOUNTER — Ambulatory Visit: Payer: Self-pay

## 2020-07-17 ENCOUNTER — Encounter: Payer: Self-pay | Admitting: Urology

## 2020-07-27 ENCOUNTER — Ambulatory Visit: Payer: Medicare Other

## 2020-07-27 ENCOUNTER — Other Ambulatory Visit: Payer: Self-pay

## 2020-07-27 DIAGNOSIS — G8929 Other chronic pain: Secondary | ICD-10-CM

## 2020-07-27 DIAGNOSIS — M6281 Muscle weakness (generalized): Secondary | ICD-10-CM

## 2020-07-27 DIAGNOSIS — M25611 Stiffness of right shoulder, not elsewhere classified: Secondary | ICD-10-CM

## 2020-07-27 DIAGNOSIS — M25511 Pain in right shoulder: Secondary | ICD-10-CM

## 2020-07-27 NOTE — Therapy (Signed)
[place] MAIN [facility] SERVICES [address] [place], [place], [address] Phone: [phone]   Fax:  [phone]  Physical Therapy Treatment  Patient Details  Name: Charles Choi MRN: [id number] Date of Birth: [birth year] Referring Provider (PT): gage, mark   Encounter Date: 07/27/2020   PT End of Session - 07/27/20 1554    Visit Number 18    Number of Visits 33    Date for PT Re-Evaluation 08/19/20    Authorization Type Eval: 6/23; Progress Note: 7/28; Re-certification: 5/17;    PT Start Time 1350    PT Stop Time 1430    PT Time Calculation (min) 40 min    Activity Tolerance Patient tolerated treatment well    Behavior During Therapy WFL for tasks assessed/performed           Past Medical History:  Diagnosis Date  . Anxiety   . Cancer ([place])   . Depression   . Hypertension   . Parkinson's disease [facility])     Past Surgical History:  Procedure Laterality Date  . BACK SURGERY    . CHOLECYSTECTOMY    . DEEP BRAIN STIMULATOR PLACEMENT     for Parkinson's disease  . JOINT REPLACEMENT     left knee x2  . ORCHIECTOMY  1983    There were no vitals filed for this visit.   Subjective Assessment - 07/27/20 1554    Subjective Pt doing well this date and no new updates. No other problems or concerns with the shoulder. No resting shoulder pain upon arrival.    Pertinent History Patient had rTSA 08/27/19, Patient sustained 2 dislocations (Dec '20, Jan '20). In February, pt underwent Rt rTSA. Pt worked with [place] after surgery, then transitioned to OPPT for resuming rehab.    Currently in Pain? No/denies               TREATMENT   Ther-ex Supine R shoulder AROM flexion x 10, pt denies pain; Supine R shoulder circles with 2# dumbbell (DB) 2 x 10 clockwise and counterclockwise; Supine serratus punches with 2# DB 2 x 10; Supine dynamic perturbations with shoulder at 90 flexion and resistance at wrist 2 x 30s; Supineextension  (towel behind arm), abduction, adduction, ER, and IRisometrics5s hold x 10 reps each; Seated RUE biceps curl 2# x 10, 3# x 10; Semirecumbent RUE tricep extension with light manual resistance 2 x 10; Seated long lever R shoulder flexion (straight sagittal place) 2 x10, cued for proper technique and to keeparm position straight, noted improvement in strength and motor control but pt continues to demonstrate fatigue; Seated long lever R shoulder in scaption 2 x 10; Rows to neutral with red tband x 10, cued to move shoulder bladesandnot elbows; R shoulder red tband resisted extension to neutral x 10;   Pt educated throughout session about proper posture and technique with exercises. Improved exercise technique, movement at target joints, use of target muscles after min to mod verbal, visual, tactile cues.   Patient is demonstrating excellentmotivation today.Session focused on strengthening exercises of RUE and back musculature. His mechanics today continue to improve with strengthening exercises as there was less shoulder shrugging with shoulder flexion and abduction.  Endurance also appears improved however he does continue to fatigue relatively easily.  Progressed weight of dumbbell today to 3# for elbow flexion.  Continued perturbations with serratus anterior punches for proprioceptive feedback and strengthening.He is encouraged to continue HEP and follow-up as scheduled. He will benefit from PT services  to address deficits in R shoulder strength in order to improve function at home.                          PT Short Term Goals - 06/25/20 1419      PT SHORT TERM GOAL #1   Title Patient will be independent in home exercise program to improve strength/mobility for better functional independence with ADLs.    Baseline Patient is not able to reach behind his back to wash it.; 06/03/20: Patient reports doing some of his exercises at home.    Time 4    Period Weeks     Status Partially Met    Target Date 07/22/20      PT SHORT TERM GOAL #2   Title Patient will increase BLE gross strength to 4+/5 as to improve functional strength for independent gait, increased standing tolerance and increased ADL ability.    Baseline 04/29/20= -3/5 strength R shoulder flex/abd/ER/IR; 06/03/20: 3/5 strength of shoulder flex/abd/ER, 2/5 ER/IR, unable to resist weight at this time    Time 4    Period Weeks    Status On-going    Target Date 07/22/20             PT Long Term Goals - 06/25/20 1419      PT LONG TERM GOAL #1   Title Patient will report a worst pain of 3/10 on VAS in R shoulder to improve tolerance with ADLs and reduced symptoms with activities.    Baseline 04/29/20= 4/10 right shoulder; 06/03/20: 2/10 right shoulder    Time 8    Period Weeks    Status Achieved      PT LONG TERM GOAL #2   Title Patient will improve shoulder AROM to > 135 degrees of flexion, scaption, and abduction for improved ability to perform overhead activities, ER30 active, IR functionally to L 1.    Baseline 06/03/20: Flexion: 113, Scaption 94, Abduction: 94    Time 8    Period Weeks    Status On-going    Target Date 08/19/20      PT LONG TERM GOAL #3   Title Patient will improve shoulder elevation and ER in scapular plane  PROM to > 140 degrees of flexion, ER 40 deg passively    Baseline 04/29/20= see evaluation; 06/03/20: Scaption: 151; ER: 59    Time 8    Period Weeks    Status Achieved      PT LONG TERM GOAL #4   Title Patient will improve his quick dash score and FOTO score to indicate functional improvements    Baseline 6/23: quick dash score = 70.45, FOTO: 44 ; 06/03/20: Quick dash: 47.7, FOTO: 48    Time 8    Period Weeks    Status On-going    Target Date 08/19/20                 Plan - 07/27/20 1555    Clinical Impression Statement Patient is demonstrating excellent motivation today. Session focused on strengthening exercises of RUE and back musculature.  His mechanics today continue to improve with strengthening exercises as there was less shoulder shrugging with shoulder flexion and abduction.   Endurance also appears improved however he does continue to fatigue relatively easily.  Progressed weight of dumbbell today to 3# for elbow flexion.  Continued perturbations with serratus anterior punches for proprioceptive feedback and strengthening. He is encouraged to continue HEP and follow-up as  scheduled. He will benefit from PT services to address deficits in R shoulder strength in order to improve function at home.    Personal Factors and Comorbidities Age    Examination-Activity Limitations Bed Mobility;Locomotion Level;Reach Overhead;Bathing;Carry;Lift;Stand    Examination-Participation Restrictions Community Activity;Cleaning;Church;Driving;Meal Prep;Yard Work    Stability/Clinical Decision Making Stable/Uncomplicated    Rehab Potential Good    PT Frequency 2x / week    PT Duration 8 weeks    PT Treatment/Interventions Joint Manipulations;Dry needling;Passive range of motion;Cryotherapy;Electrical Stimulation;Moist Heat;Ultrasound;Therapeutic activities;Functional mobility training;Therapeutic exercise;Patient/family education;Manual techniques    PT Next Visit Plan Continue strengthening shoulder and back musculature, ROM    PT Home Exercise Plan Mebridge Access Code: [email or web address]    Consulted and Agree with Plan of Care Patient           Patient will benefit from skilled therapeutic intervention in order to improve the following deficits and impairments:  Decreased range of motion, Decreased strength, Impaired flexibility, Decreased safety awareness, Decreased mobility, Pain, Decreased activity tolerance  Visit Diagnosis: Muscle weakness (generalized)  Stiffness of right shoulder, not elsewhere classified  Chronic right shoulder pain     Problem List Patient Active Problem List   Diagnosis Date Noted  . Arthritis of left shoulder  region 05/08/2019  . Abnormal glucose 12/05/2018  . Essential hypertension, benign 09/15/2018  . Dyslipidemia 09/15/2018  . Abnormal stress test 07/20/2018  . Coronary artery disease involving native coronary artery of native heart 07/20/2018  . CKD (chronic kidney disease) stage 3, GFR 30-59 ml/min 07/13/2018  . Erectile dysfunction 07/13/2018  . Insomnia 07/13/2018  . Hypogonadism in male 06/18/2018  . Testicular cancer ([place]) 06/13/2018  . Hyperlipidemia 06/13/2018  . Depression 06/13/2018  . Anxiety 06/13/2018  . Callus of foot 03/27/2018  . Pes planovalgus, acquired, right 01/30/2018  . Arthritis of midfoot 01/30/2018  . Achilles tendon contracture, right 01/30/2018  . Left shoulder pain 07/26/2017  . Infection of prosthetic left knee joint ([place]) 07/10/2017  . 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[place]) 05/15/2017  . GERD (gastroesophageal reflux disease) 03/20/2017  . Primary osteoarthritis of one knee, right 12/30/2016  . H/O total knee replacement, left 12/30/2016  . Moderate recurrent major depression ([place]) 08/15/2016  . Chest pain due to myocardial ischemia 08/14/2016  . Chest pain, rule out acute myocardial infarction 08/14/2016  . Chest x-ray abnormality 10/17/2013  . Scoliosis 07/11/2012  . Arthritis of wrist, left, degenerative 07/11/2012  . Parkinson's disease ([place]) 01/07/2012   Phillips Grout PT, DPT, GCS  Amy Gothard 07/28/2020, 8:13 AM  [place] MAIN [facility] SERVICES [address] [place], [place], [address] Phone: [phone]   Fax:  [phone]  Name: Charles Choi MRN: [id number] Date of Birth: [birth year]

## 2020-07-29 ENCOUNTER — Ambulatory Visit: Payer: Medicare Other

## 2020-08-03 ENCOUNTER — Ambulatory Visit: Payer: Medicare Other

## 2020-08-12 ENCOUNTER — Ambulatory Visit: Payer: Medicare Other

## 2020-08-21 ENCOUNTER — Emergency Department
Admission: EM | Admit: 2020-08-21 | Discharge: 2020-08-21 | Disposition: A | Discharge status: Home / Self Care | Payer: Medicare Other | Attending: Emergency Medicine | Admitting: Emergency Medicine

## 2020-08-21 ENCOUNTER — Other Ambulatory Visit: Payer: Self-pay

## 2020-08-21 ENCOUNTER — Emergency Department: Payer: Medicare Other

## 2020-08-21 DIAGNOSIS — G20 Parkinson's disease: Secondary | ICD-10-CM | POA: Diagnosis not present

## 2020-08-21 DIAGNOSIS — N183 Chronic kidney disease, stage 3 unspecified: Secondary | ICD-10-CM | POA: Insufficient documentation

## 2020-08-21 DIAGNOSIS — R0789 Other chest pain: Secondary | ICD-10-CM | POA: Insufficient documentation

## 2020-08-21 DIAGNOSIS — Z79899 Other long term (current) drug therapy: Secondary | ICD-10-CM | POA: Diagnosis not present

## 2020-08-21 DIAGNOSIS — Z8547 Personal history of malignant neoplasm of testis: Secondary | ICD-10-CM | POA: Insufficient documentation

## 2020-08-21 DIAGNOSIS — Z7982 Long term (current) use of aspirin: Secondary | ICD-10-CM | POA: Diagnosis not present

## 2020-08-21 DIAGNOSIS — I2510 Atherosclerotic heart disease of native coronary artery without angina pectoris: Secondary | ICD-10-CM | POA: Diagnosis not present

## 2020-08-21 DIAGNOSIS — I129 Hypertensive chronic kidney disease with stage 1 through stage 4 chronic kidney disease, or unspecified chronic kidney disease: Secondary | ICD-10-CM | POA: Diagnosis not present

## 2020-08-21 DIAGNOSIS — Z96652 Presence of left artificial knee joint: Secondary | ICD-10-CM | POA: Insufficient documentation

## 2020-08-21 LAB — COMPREHENSIVE METABOLIC PANEL
ALT: 22 U/L (ref 0–44)
AST: 29 U/L (ref 15–41)
Albumin: 4.1 g/dL (ref 3.5–5.0)
Alkaline Phosphatase: 67 U/L (ref 38–126)
Anion gap: 12 (ref 5–15)
BUN: 19 mg/dL (ref 8–23)
CO2: 21 mmol/L — ABNORMAL LOW (ref 22–32)
Calcium: 9.1 mg/dL (ref 8.9–10.3)
Chloride: 105 mmol/L (ref 98–111)
Creatinine, Ser: 1.3 mg/dL — ABNORMAL HIGH (ref 0.61–1.24)
GFR, Estimated: 56 mL/min — ABNORMAL LOW (ref >60)
Glucose, Bld: 113 mg/dL — ABNORMAL HIGH (ref 70–99)
Potassium: 3.6 mmol/L (ref 3.5–5.1)
Sodium: 138 mmol/L (ref 135–145)
Total Bilirubin: 1.2 mg/dL (ref 0.3–1.2)
Total Protein: 6.7 g/dL (ref 6.5–8.1)

## 2020-08-21 LAB — URINALYSIS, COMPLETE (UACMP) WITH MICROSCOPIC
Bacteria, UA: NONE SEEN
Bilirubin Urine: NEGATIVE
Glucose, UA: 50 mg/dL — AB
Ketones, ur: 5 mg/dL — AB
Leukocytes,Ua: NEGATIVE
Nitrite: NEGATIVE
Protein, ur: 100 mg/dL — AB
Specific Gravity, Urine: 1.026 (ref 1.005–1.030)
Squamous Epithelial / HPF: NONE SEEN (ref 0–5)
pH: 5 (ref 5.0–8.0)

## 2020-08-21 LAB — CBC
HCT: 40.8 % (ref 39.0–52.0)
Hemoglobin: 12.4 g/dL — ABNORMAL LOW (ref 13.0–17.0)
MCH: 22.4 pg — ABNORMAL LOW (ref 26.0–34.0)
MCHC: 30.4 g/dL (ref 30.0–36.0)
MCV: 73.8 fL — ABNORMAL LOW (ref 80.0–100.0)
Platelets: 192 10*3/uL (ref 150–400)
RBC: 5.53 MIL/uL (ref 4.22–5.81)
RDW: 19.1 % — ABNORMAL HIGH (ref 11.5–15.5)
WBC: 7.8 10*3/uL (ref 4.0–10.5)
nRBC: 0 % (ref 0.0–0.2)

## 2020-08-21 LAB — TROPONIN I (HIGH SENSITIVITY)
Troponin I (High Sensitivity): 13 ng/L (ref <18)
Troponin I (High Sensitivity): 15 ng/L (ref <18)

## 2020-08-21 LAB — LIPASE, BLOOD: Lipase: 31 U/L (ref 11–51)

## 2020-08-21 MED ORDER — ASPIRIN 81 MG PO CHEW: 324.0000 mg | CHEWABLE_TABLET | Freq: Once | ORAL | Status: DC

## 2020-08-21 NOTE — ED Provider Notes (Signed)
[facility] Emergency Department Provider Note   ____________________________________________   First MD Initiated Contact with Patient 08/21/20 1618     (approximate)  I have reviewed the triage vital signs and the nursing notes.   HISTORY  Chief Complaint Abdominal Pain and Chest Pain    HPI Charles Choi is a 69 y.o. male history of Parkinson's disease hypertension depression.  Also reports previous catheterization a couple years ago at [facility] with his cardiologist and was informed he had some coronary disease but nothing that needed to be stented   Been having sense of slight chest tightness that comes and goes not with exertion off and on for about a week now.  Seem to notice it when he went on a trip to the [place] with friends, and it is just persisted on and off for about a week  No nausea vomiting.  No fatigue denies abdominal pain.  Felt at one point did feel like an acid reflux without gone away  Currently pain and symptom-free.  Last had symptoms this morning prompting him to call 911  Does not radiate.  Not sharp. Received aspirin with EMS  History of Parkinson's, also coronary disease has a cardiologist he sees at [facility] Dr. Posey Pronto  Past Medical History:  Diagnosis Date  . Anxiety   . Cancer ([place])   . Depression   . Hypertension   . Parkinson's disease [facility])     Patient Active Problem List   Diagnosis Date Noted  . Arthritis of left shoulder region 05/08/2019  . Abnormal glucose 12/05/2018  . Essential hypertension, benign 09/15/2018  . Dyslipidemia 09/15/2018  . Abnormal stress test 07/20/2018  . Coronary artery disease involving native coronary artery of native heart 07/20/2018  . CKD (chronic kidney disease) stage 3, GFR 30-59 ml/min (HCC) 07/13/2018  . Erectile dysfunction 07/13/2018  . Insomnia 07/13/2018  . Hypogonadism in male 06/18/2018  . Testicular cancer ([place]) 06/13/2018  . Hyperlipidemia 06/13/2018  .  Depression 06/13/2018  . Anxiety 06/13/2018  . Callus of foot 03/27/2018  . Pes planovalgus, acquired, right 01/30/2018  . Arthritis of midfoot 01/30/2018  . Achilles tendon contracture, right 01/30/2018  . Left shoulder pain 07/26/2017  . Infection of prosthetic left knee joint ([place]) 07/10/2017  . 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[place]) 05/15/2017  . GERD (gastroesophageal reflux disease) 03/20/2017  . Primary osteoarthritis of one knee, right 12/30/2016  . H/O total knee replacement, left 12/30/2016  . Moderate recurrent major depression ([place]) 08/15/2016  . Chest pain due to myocardial ischemia 08/14/2016  . Chest pain, rule out acute myocardial infarction 08/14/2016  . Chest x-ray abnormality 10/17/2013  . Scoliosis 07/11/2012  . Arthritis of wrist, left, degenerative 07/11/2012  . Parkinson's disease ([place]) 01/07/2012    Past Surgical History:  Procedure Laterality Date  . BACK SURGERY    . CHOLECYSTECTOMY    . DEEP BRAIN STIMULATOR PLACEMENT     for Parkinson's disease  . JOINT REPLACEMENT     left knee x2  . ORCHIECTOMY  1983    Prior to Admission medications   Medication Sig Start Date End Date Taking? Authorizing Provider  acetaminophen (TYLENOL) 325 MG tablet Take 925 mg by mouth every 6 (six) hours as needed for mild pain or moderate pain. 08/30/18   [provider]  amantadine (SYMMETREL) 100 MG capsule Take 1 capsule (100 mg total) by mouth daily. 09/19/18   Gerlene Fee, NP  aspirin 325 MG tablet Take 325  mg by mouth 2 (two) times daily. 08/30/18   [provider]  atorvastatin (LIPITOR) 20 MG tablet Take 1 tablet (20 mg total) by mouth daily. 09/19/18   Gerlene Fee, NP  carbidopa-levodopa (SINEMET IR) 25-100 MG tablet Take 1 tablet orally two times a day at 7am, and 10pm. Take 0.5 tablet orally at 10am, 1pm, 4pm, and 7pm. 09/19/18   Gerlene Fee, NP  ketoconazole (NIZORAL) 2 % cream Apply 1 application topically daily. Apply to flaky  areas daily as needed    [provider]  losartan (COZAAR) 100 MG tablet Take 1 tablet (100 mg total) by mouth daily. 09/19/18   Gerlene Fee, NP  Melatonin 3 MG CAPS Take 1-2 tablets by mouth 2 hours before bedtime    [provider]  meloxicam (MOBIC) 7.5 MG tablet Take 1 tablet (7.5 mg total) by mouth daily. 09/19/18   Gerlene Fee, NP  metoprolol succinate (TOPROL-XL) 25 MG 24 hr tablet Take 1 tablet (25 mg total) by mouth 2 (two) times daily. 09/19/18   Gerlene Fee, NP  metoprolol tartrate (LOPRESSOR) 25 MG tablet TAKE 1 TABLET TWICE DAILY 06/28/19   [provider]  NON FORMULARY Diet Type: Regular    [provider]  omeprazole (PRILOSEC OTC) 20 MG tablet Take 40 mg by mouth daily.  08/30/18   [provider]  omeprazole (PRILOSEC) 40 MG capsule Take 1 capsule by mouth daily. 05/01/20   [provider]  penicillin v potassium (VEETID) 500 MG tablet Take 500 mg by mouth 4 (four) times daily. 05/24/20   [provider]  Potassium Citrate 15 MEQ (1620 MG) TBCR Take 1 tablet by mouth 2 (two) times daily. 09/19/18   Gerlene Fee, NP  ranolazine (RANEXA) 500 MG 12 hr tablet  06/20/19   [provider]  rOPINIRole (REQUIP) 2 MG tablet Take 1 tablet (2 mg total) by mouth every 3 (three) hours. At 7am, and 1pm. 09/19/18   Gerlene Fee, NP  selegiline (ELDEPRYL) 5 MG capsule Take 1 capsule (5 mg total) by mouth 2 (two) times daily. At 7am and 1pm. 09/19/18   Gerlene Fee, NP  tadalafil (CIALIS) 10 MG tablet TAKE 2 TABLETS BY MOUTH DAILY AS NEEDED 05/06/20   Stoioff, Ronda Fairly, MD  testosterone cypionate (DEPOTESTOSTERONE CYPIONATE) 200 MG/ML injection Inject 1.2 mLs (240 mg total) into the muscle every 14 (fourteen) days. 03/12/20   Stoioff, Ronda Fairly, MD  traZODone (DESYREL) 150 MG tablet Take 0.5 tablets (75 mg total) by mouth at bedtime as needed. 09/19/18   Gerlene Fee, NP    Allergies Phenergan  [promethazine hcl] and Reglan [metoclopramide]  History reviewed. No pertinent family history.  Social History Social History   Tobacco Use  . Smoking status: Never Smoker  . Smokeless tobacco: Never Used  Substance Use Topics  . Alcohol use: Yes    Comment: 1 drink per week  . Drug use: Yes    Types: Marijuana    Review of Systems Constitutional: No fever/chills Eyes: No visual changes. ENT: No sore throat. Cardiovascular: Intermittent episodes of chest tightness very mild across the front of his chest ongoing off and on for about 1 to 2 weeks.  No pain at this time. Respiratory: Denies shortness of breath. Gastrointestinal: No abdominal pain.   Genitourinary: Negative for dysuria. Musculoskeletal: Negative for back pain. Skin: Negative for rash. Neurological: Negative for headaches, areas of focal weakness or numbness.  Chronic Parkinson's  for over 40 years with DBS unit    ____________________________________________   PHYSICAL EXAM:  VITAL SIGNS: ED Triage Vitals  Enc Vitals Group     BP 08/21/20 1222 (!) 175/84     Pulse Rate 08/21/20 1222 (!) 105     Resp 08/21/20 1222 20     Temp 08/21/20 1222 98.2 F (36.8 C)     Temp Source 08/21/20 1222 Oral     SpO2 08/21/20 1222 96 %     Weight 08/21/20 1224 175 lb (79.4 kg)     Height 08/21/20 1224 5\' 7"  (1.702 m)     Head Circumference --      Peak Flow --      Pain Score 08/21/20 1223 5     Pain Loc --      Pain Edu? --      Excl. in [place]? --     Constitutional: Alert and oriented. Well appearing and in no acute distress.  Somewhat parkinsonian or abnormal myoclonic type activities patient reports her normal Eyes: Conjunctivae are normal. Head: Atraumatic. Nose: No congestion/rhinnorhea. Mouth/Throat: Mucous membranes are moist. Neck: No stridor.  Cardiovascular: Normal rate, regular rhythm. Grossly normal heart sounds.  Good peripheral circulation. Respiratory: Normal respiratory effort.  No retractions.  Lungs CTAB. Gastrointestinal: Soft and nontender. No distention. Musculoskeletal: No lower extremity tenderness nor edema. Neurologic:  Normal speech and language. No gross focal neurologic deficits are appreciated.  Skin:  Skin is warm, dry and intact. No rash noted. Psychiatric: Mood and affect are normal. Speech and behavior are normal.  ____________________________________________   LABS (all labs ordered are listed, but only abnormal results are displayed)  Labs Reviewed  CBC - Abnormal; Notable for the following components:      Result Value   Hemoglobin 12.4 (*)    MCV 73.8 (*)    MCH 22.4 (*)    RDW 19.1 (*)    All other components within normal limits  COMPREHENSIVE METABOLIC PANEL - Abnormal; Notable for the following components:   CO2 21 (*)    Glucose, Bld 113 (*)    Creatinine, Ser 1.30 (*)    GFR, Estimated 56 (*)    All other components within normal limits  URINALYSIS, COMPLETE (UACMP) WITH MICROSCOPIC - Abnormal; Notable for the following components:   Color, Urine YELLOW (*)    APPearance HAZY (*)    Glucose, UA 50 (*)    Hgb urine dipstick SMALL (*)    Ketones, ur 5 (*)    Protein, ur 100 (*)    All other components within normal limits  LIPASE, BLOOD  TROPONIN I (HIGH SENSITIVITY)  TROPONIN I (HIGH SENSITIVITY)   ____________________________________________  EKG  Reviewed entered by me at 1215 Heart rate 105 QRS 85 QTc 460 Minimal nonspecific abnormality  , nonspecific T wave abnormality. Sinus tachycardia.  No obvious ischemia ____________________________________________  RADIOLOGY  DG Chest 2 View  Result Date: 08/21/2020 CLINICAL DATA:  Chest pain EXAM: CHEST - 2 VIEW COMPARISON:  08/14/2016 FINDINGS: Heart size and vascularity within normal limits. Lungs are well aerated and clear without infiltrate or effusion. Bilateral shoulder surgery. Generator pack with leads extending into the neck. IMPRESSION: No active cardiopulmonary disease.  Electronically Signed   By: Franchot Gallo M.D.   On: 08/21/2020 13:23     Chest x-ray results viewed by me, no active disease ____________________________________________   PROCEDURES  Procedure(s) performed: None  Procedures  Critical Care performed: No  ____________________________________________   INITIAL IMPRESSION /  ASSESSMENT AND PLAN / ED COURSE  Pertinent labs & imaging results that were available during my care of the patient were reviewed by me and considered in my medical decision making (see chart for details).   Differential diagnosis includes, but is not limited to, ACS, aortic dissection, pulmonary embolism, cardiac tamponade, pneumothorax, pneumonia, pericarditis, myocarditis, GI-related causes including esophagitis/gastritis, and musculoskeletal chest wall pain.  Patient denies acute abdominal symptoms.  Reassuring exam no abdominal pain.  Reports he felt like he had some upset stomach at 1 point about a week ago but that is resolved.  His clinical symptoms do not appear consistent with dissection.  His clinical history and exam do not appear consistent with pulmonary embolism.  Normal heart rate normal oxygen saturation no pleuritic pain.  [facility] records reviewed "Cardiac catheterization 11/09/2018: Dominance: right Left main: normal LAD: distal 50% - calcified; 1st diagonal 50% - medium, diffuse; 2nd diagonal 100% - medium LCx: insignificant RCA: right PDA 60% - large, diffuse, calcified "  Patient's first troponin here is normal.  HEAR Score: 6   Is currently pain and symptom-free.  No pleuritic pain.  No shortness of breath.  No history of DVT or PE.  He does not smoke.  No clinical findings to suggest DVT or PE.  No recent surgeries or hospitalizations.  No long trips or travels with road trip only a [place] hours [place]  Discussed with the patient treatment recommendations options and his work-up to this point.  He does have some slight  tremulousness and baseline artifact but I do not see evidence of obvious acute ischemia.  Possible LVH with mild repolarization abnormality.  First troponin is normal he is currently pain-free.  He is agreeable to continuation on a baby aspirin daily and would like to follow-up closely with Dr. Doy Hutching and his cardiologist Dr. Posey Pronto at [facility].  Return precautions and treatment recommendations and follow-up discussed with the patient who is agreeable with the plan.      ____________________________________________   FINAL CLINICAL IMPRESSION(S) / ED DIAGNOSES  Final diagnoses:  Atypical chest pain        Note:  This document was prepared using Dragon voice recognition software and may include unintentional dictation errors       Delman Kitten, MD 08/21/20 1820

## 2020-08-21 NOTE — ED Triage Notes (Signed)
Chest tightness X 1 week and "my bell has been upset" with mild diarrhea. EMS reports VSS. 324 ASA given en route. Pt alert and oriented X4, cooperative, RR even and unlabored, color WNL. Pt in NAD. Did not take BP medication today.

## 2020-08-21 NOTE — Discharge Instructions (Addendum)

## 2020-11-07 DIAGNOSIS — C4492 Squamous cell carcinoma of skin, unspecified: Secondary | ICD-10-CM

## 2020-11-07 HISTORY — DX: Squamous cell carcinoma of skin, unspecified: C44.92

## 2020-11-09 ENCOUNTER — Other Ambulatory Visit: Payer: Self-pay | Admitting: Urology

## 2020-11-30 ENCOUNTER — Other Ambulatory Visit: Payer: Self-pay

## 2020-11-30 DIAGNOSIS — E291 Testicular hypofunction: Secondary | ICD-10-CM

## 2020-11-30 DIAGNOSIS — R972 Elevated prostate specific antigen [PSA]: Secondary | ICD-10-CM

## 2020-12-01 ENCOUNTER — Encounter: Payer: Self-pay | Admitting: Urology

## 2020-12-01 ENCOUNTER — Other Ambulatory Visit: Payer: Self-pay

## 2021-01-04 ENCOUNTER — Telehealth: Payer: Self-pay | Admitting: Urology

## 2021-01-04 NOTE — Telephone Encounter (Signed)
Pt has moved to [facility], [place] states he has a vial here and wants to know if it is still good (not expired) and pt is asking if the nurse there can give him his testosterone injections- please advise.

## 2021-01-05 ENCOUNTER — Other Ambulatory Visit: Payer: Self-pay | Admitting: *Deleted

## 2021-01-05 NOTE — Telephone Encounter (Signed)
Talked with patient.Advised him that his nurse needs to call us and we can set something up .

## 2021-01-05 NOTE — Telephone Encounter (Signed)
He needs to get labs drawn before we can refill.  If he can have them drawn there that is fine

## 2021-01-05 NOTE — Telephone Encounter (Signed)
If the medication is not expired it is okay for nurses there to administer.  They may require an order from [place]

## 2021-01-05 NOTE — Telephone Encounter (Addendum)
Nurse at [place] left a mail [employer] . She needs order for patient testosterone injections. I need fax need to send.

## 2021-01-05 NOTE — Telephone Encounter (Signed)
He did not have January 2022 labs drawn.  Will need to have drawn before Rx can be refilled

## 2021-01-05 NOTE — Telephone Encounter (Signed)
Nurse

## 2021-01-06 NOTE — Telephone Encounter (Signed)
Charles Choi from [facility] called wanting to know the status of patient being able to receive injections at the home. It was explained he would need to have labs drawn, she states that they are able to draw labs there. Attempted to give a verbal order she was unable to take due to RN out of contact. She was asked to have orders faxed for [place] to fill out and fax back. She verbalized understanding

## 2021-01-12 ENCOUNTER — Other Ambulatory Visit: Payer: Medicare Other

## 2021-01-12 ENCOUNTER — Other Ambulatory Visit: Payer: Self-pay

## 2021-01-12 DIAGNOSIS — E291 Testicular hypofunction: Secondary | ICD-10-CM

## 2021-01-12 DIAGNOSIS — R972 Elevated prostate specific antigen [PSA]: Secondary | ICD-10-CM

## 2021-01-13 LAB — TESTOSTERONE: Testosterone: 224 ng/dL — ABNORMAL LOW (ref 264–916)

## 2021-01-13 LAB — PSA: Prostate Specific Ag, Serum: 3.4 ng/mL (ref 0.0–4.0)

## 2021-01-13 LAB — HEMATOCRIT: Hematocrit: 39.2 % (ref 37.5–51.0)

## 2021-01-14 ENCOUNTER — Encounter: Payer: Self-pay | Admitting: *Deleted

## 2021-01-14 ENCOUNTER — Other Ambulatory Visit: Payer: Self-pay | Admitting: Urology

## 2021-01-14 MED ORDER — TESTOSTERONE CYPIONATE 200 MG/ML IM SOLN
INTRAMUSCULAR | 0 refills | Status: DC
Start: 2021-01-14 — End: 2021-05-18

## 2021-01-31 ENCOUNTER — Emergency Department: Payer: Medicare Other

## 2021-01-31 ENCOUNTER — Other Ambulatory Visit: Payer: Self-pay

## 2021-01-31 ENCOUNTER — Emergency Department
Admission: EM | Admit: 2021-01-31 | Discharge: 2021-02-01 | Disposition: A | Discharge status: Home / Self Care | Payer: Medicare Other | Attending: Emergency Medicine | Admitting: Emergency Medicine

## 2021-01-31 DIAGNOSIS — G20 Parkinson's disease: Secondary | ICD-10-CM | POA: Diagnosis not present

## 2021-01-31 DIAGNOSIS — Z96652 Presence of left artificial knee joint: Secondary | ICD-10-CM | POA: Diagnosis not present

## 2021-01-31 DIAGNOSIS — N183 Chronic kidney disease, stage 3 unspecified: Secondary | ICD-10-CM | POA: Insufficient documentation

## 2021-01-31 DIAGNOSIS — K5903 Drug induced constipation: Secondary | ICD-10-CM | POA: Diagnosis not present

## 2021-01-31 DIAGNOSIS — I129 Hypertensive chronic kidney disease with stage 1 through stage 4 chronic kidney disease, or unspecified chronic kidney disease: Secondary | ICD-10-CM | POA: Insufficient documentation

## 2021-01-31 DIAGNOSIS — Z7982 Long term (current) use of aspirin: Secondary | ICD-10-CM | POA: Diagnosis not present

## 2021-01-31 DIAGNOSIS — R0602 Shortness of breath: Secondary | ICD-10-CM | POA: Diagnosis not present

## 2021-01-31 DIAGNOSIS — Z79899 Other long term (current) drug therapy: Secondary | ICD-10-CM | POA: Diagnosis not present

## 2021-01-31 DIAGNOSIS — K5900 Constipation, unspecified: Secondary | ICD-10-CM | POA: Diagnosis present

## 2021-01-31 DIAGNOSIS — Z8547 Personal history of malignant neoplasm of testis: Secondary | ICD-10-CM | POA: Diagnosis not present

## 2021-01-31 LAB — COMPREHENSIVE METABOLIC PANEL
ALT: 48 U/L — ABNORMAL HIGH (ref 0–44)
AST: 44 U/L — ABNORMAL HIGH (ref 15–41)
Albumin: 4.4 g/dL (ref 3.5–5.0)
Alkaline Phosphatase: 80 U/L (ref 38–126)
Anion gap: 8 (ref 5–15)
BUN: 27 mg/dL — ABNORMAL HIGH (ref 8–23)
CO2: 27 mmol/L (ref 22–32)
Calcium: 9.5 mg/dL (ref 8.9–10.3)
Chloride: 107 mmol/L (ref 98–111)
Creatinine, Ser: 1.28 mg/dL — ABNORMAL HIGH (ref 0.61–1.24)
GFR, Estimated: >60 mL/min (ref >60)
Glucose, Bld: 101 mg/dL — ABNORMAL HIGH (ref 70–99)
Potassium: 4.3 mmol/L (ref 3.5–5.1)
Sodium: 142 mmol/L (ref 135–145)
Total Bilirubin: 1.1 mg/dL (ref 0.3–1.2)
Total Protein: 7.8 g/dL (ref 6.5–8.1)

## 2021-01-31 LAB — CBC WITH DIFFERENTIAL/PLATELET
Abs Immature Granulocytes: 0.05 10*3/uL (ref 0.00–0.07)
Basophils Absolute: 0 10*3/uL (ref 0.0–0.1)
Basophils Relative: 0 %
Eosinophils Absolute: 0.1 10*3/uL (ref 0.0–0.5)
Eosinophils Relative: 1 %
HCT: 42.1 % (ref 39.0–52.0)
Hemoglobin: 12.7 g/dL — ABNORMAL LOW (ref 13.0–17.0)
Immature Granulocytes: 0 %
Lymphocytes Relative: 8 %
Lymphs Abs: 0.9 10*3/uL (ref 0.7–4.0)
MCH: 23.1 pg — ABNORMAL LOW (ref 26.0–34.0)
MCHC: 30.2 g/dL (ref 30.0–36.0)
MCV: 76.7 fL — ABNORMAL LOW (ref 80.0–100.0)
Monocytes Absolute: 0.9 10*3/uL (ref 0.1–1.0)
Monocytes Relative: 8 %
Neutro Abs: 9.8 10*3/uL — ABNORMAL HIGH (ref 1.7–7.7)
Neutrophils Relative %: 83 %
Platelets: 262 10*3/uL (ref 150–400)
RBC: 5.49 MIL/uL (ref 4.22–5.81)
RDW: 16.9 % — ABNORMAL HIGH (ref 11.5–15.5)
WBC: 11.8 10*3/uL — ABNORMAL HIGH (ref 4.0–10.5)
nRBC: 0 % (ref 0.0–0.2)

## 2021-01-31 MED ORDER — IOHEXOL 300 MG/ML  SOLN
100.0000 mL | Freq: Once | INTRAMUSCULAR | Status: AC | PRN
  Administered 2021-01-31: 100 mL via INTRAVENOUS
  Filled 2021-01-31: qty 100

## 2021-01-31 MED ORDER — FLEET ENEMA 7-19 GM/118ML RE ENEM: 1.0000 | ENEMA | Freq: Once | RECTAL | Status: DC

## 2021-01-31 NOTE — ED Triage Notes (Signed)
Pt comes pov from home with constipation. Had surgery Monday at [place] for his cancer. Took a stool softener about an hour ago. Reports no BM for a week. Hx of Parkinson's and HTN. Pt has drain in place in neck from surgery. Pt has been on pain meds from the surgery.

## 2021-01-31 NOTE — ED Notes (Signed)
Patient provided with ice chips, aware of need for urine specimen. Urinal at bedside.

## 2021-01-31 NOTE — ED Notes (Signed)
Patient transported to CT 

## 2021-01-31 NOTE — ED Provider Notes (Signed)
[facility] Emergency Department Provider Note  ____________________________________________   Event Date/Time   First MD Initiated Contact with Patient 01/31/21 1956     (approximate)  I have reviewed the triage vital signs and the nursing notes.   HISTORY  Chief Complaint Constipation  HPI Charles Choi is a 69 y.o. male who presents to emergency department today for evaluation of constipation.  Patient had surgery 6 days ago at [facility] for cancer of the tongue.  He had a partial tongue removal and has remaining drain still in place in the neck from this procedure.  He has not had a full bowel movement since before the surgery, was able to go a very small amount earlier today, but still feels a lot of pressure like he needs to go.  He does take a daily Senokot, has also tried mag citrate at home without improvement.  He also reports that since time of surgery, he has had a lot of urinary frequency, but denies any foul odor, retention.  He has not had any fevers, chills or other complications from surgery.  He denies any frank abdominal pain, but does report a lot of rectal pain.  He denies any nausea/vomiting, has had decreased appetite but he feels this is due to the discomfort of his tongue.  In addition, patient is reporting intermittent shortness of breath since the surgery.  He states that he was not provided an incentive spirometer following surgery given his tongue procedure.  He does not endorse any associated chest pain, palpitations, faintness or weakness.         Past Medical History:  Diagnosis Date  . Anxiety   . Cancer ([place])   . Depression   . Hypertension   . Parkinson's disease [facility])     Patient Active Problem List   Diagnosis Date Noted  . Arthritis of left shoulder region 05/08/2019  . Abnormal glucose 12/05/2018  . Essential hypertension, benign 09/15/2018  . Dyslipidemia 09/15/2018  . Abnormal stress test 07/20/2018  . Coronary  artery disease involving native coronary artery of native heart 07/20/2018  . CKD (chronic kidney disease) stage 3, GFR 30-59 ml/min (HCC) 07/13/2018  . Erectile dysfunction 07/13/2018  . Insomnia 07/13/2018  . Hypogonadism in male 06/18/2018  . Testicular cancer ([place]) 06/13/2018  . Hyperlipidemia 06/13/2018  . Depression 06/13/2018  . Anxiety 06/13/2018  . Callus of foot 03/27/2018  . Pes planovalgus, acquired, right 01/30/2018  . Arthritis of midfoot 01/30/2018  . Achilles tendon contracture, right 01/30/2018  . Left shoulder pain 07/26/2017  . Infection of prosthetic left knee joint ([place]) 07/10/2017  . 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[place]) 05/15/2017  . GERD (gastroesophageal reflux disease) 03/20/2017  . Primary osteoarthritis of one knee, right 12/30/2016  . H/O total knee replacement, left 12/30/2016  . Moderate recurrent major depression ([place]) 08/15/2016  . Chest pain due to myocardial ischemia 08/14/2016  . Chest pain, rule out acute myocardial infarction 08/14/2016  . Chest x-ray abnormality 10/17/2013  . Scoliosis 07/11/2012  . Arthritis of wrist, left, degenerative 07/11/2012  . Parkinson's disease ([place]) 01/07/2012    Past Surgical History:  Procedure Laterality Date  . BACK SURGERY    . CHOLECYSTECTOMY    . DEEP BRAIN STIMULATOR PLACEMENT     for Parkinson's disease  . JOINT REPLACEMENT     left knee x2  . ORCHIECTOMY  1983    Prior to Admission medications   Medication Sig Start Date End Date Taking? Authorizing  Provider  acetaminophen (TYLENOL) 325 MG tablet Take 925 mg by mouth every 6 (six) hours as needed for mild pain or moderate pain. 08/30/18   [provider]  amantadine (SYMMETREL) 100 MG capsule Take 1 capsule (100 mg total) by mouth daily. 09/19/18   Gerlene Fee, NP  aspirin 325 MG tablet Take 325 mg by mouth 2 (two) times daily. 08/30/18   [provider]  atorvastatin (LIPITOR) 20 MG tablet Take 1 tablet (20 mg total) by  mouth daily. 09/19/18   Gerlene Fee, NP  carbidopa-levodopa (SINEMET IR) 25-100 MG tablet Take 1 tablet orally two times a day at 7am, and 10pm. Take 0.5 tablet orally at 10am, 1pm, 4pm, and 7pm. 09/19/18   Gerlene Fee, NP  ketoconazole (NIZORAL) 2 % cream Apply 1 application topically daily. Apply to flaky areas daily as needed    [provider]  losartan (COZAAR) 100 MG tablet Take 1 tablet (100 mg total) by mouth daily. 09/19/18   Gerlene Fee, NP  Melatonin 3 MG CAPS Take 1-2 tablets by mouth 2 hours before bedtime    [provider]  meloxicam (MOBIC) 7.5 MG tablet Take 1 tablet (7.5 mg total) by mouth daily. 09/19/18   Gerlene Fee, NP  metoprolol succinate (TOPROL-XL) 25 MG 24 hr tablet Take 1 tablet (25 mg total) by mouth 2 (two) times daily. 09/19/18   Gerlene Fee, NP  metoprolol tartrate (LOPRESSOR) 25 MG tablet TAKE 1 TABLET TWICE DAILY 06/28/19   [provider]  NON FORMULARY Diet Type: Regular    [provider]  omeprazole (PRILOSEC OTC) 20 MG tablet Take 40 mg by mouth daily.  08/30/18   [provider]  omeprazole (PRILOSEC) 40 MG capsule Take 1 capsule by mouth daily. 05/01/20   [provider]  penicillin v potassium (VEETID) 500 MG tablet Take 500 mg by mouth 4 (four) times daily. 05/24/20   [provider]  Potassium Citrate 15 MEQ (1620 MG) TBCR Take 1 tablet by mouth 2 (two) times daily. 09/19/18   Gerlene Fee, NP  ranolazine (RANEXA) 500 MG 12 hr tablet  06/20/19   [provider]  rOPINIRole (REQUIP) 2 MG tablet Take 1 tablet (2 mg total) by mouth every 3 (three) hours. At 7am, and 1pm. 09/19/18   Gerlene Fee, NP  selegiline (ELDEPRYL) 5 MG capsule Take 1 capsule (5 mg total) by mouth 2 (two) times daily. At 7am and 1pm. 09/19/18   Gerlene Fee, NP  tadalafil (CIALIS) 10 MG tablet TAKE 2 TABLETS BY MOUTH DAILY AS NEEDED 05/06/20   Stoioff, Ronda Fairly, MD  testosterone  cypionate (DEPOTESTOSTERONE CYPIONATE) 200 MG/ML injection INJECT 1.2MLS INTO THE MUSCLE EVERY 14 DAYS AS DIRECTED 01/14/21   Stoioff, Ronda Fairly, MD  traZODone (DESYREL) 150 MG tablet Take 0.5 tablets (75 mg total) by mouth at bedtime as needed. 09/19/18   Gerlene Fee, NP    Allergies Phenergan [promethazine hcl] and Reglan [metoclopramide]  History reviewed. No pertinent family history.  Social History Social History   Tobacco Use  . Smoking status: Never Smoker  . Smokeless tobacco: Never Used  Substance Use Topics  . Alcohol use: Yes    Comment: 1 drink per week  . Drug use: Yes    Types: Marijuana    Review of Systems Constitutional: No fever/chills Eyes: No visual changes. ENT: No sore throat. Cardiovascular: Denies chest pain. Respiratory: Denies shortness of breath. Gastrointestinal: No  abdominal pain.  No nausea, no vomiting.  No diarrhea.  + constipation. Genitourinary: Negative for dysuria. Musculoskeletal: Negative for back pain. Skin: Negative for rash. Neurological: Negative for headaches, focal weakness or numbness.  ____________________________________________   PHYSICAL EXAM:  VITAL SIGNS: ED Triage Vitals  Enc Vitals Group     BP 01/31/21 1952 (!) 149/90     Pulse Rate 01/31/21 1952 78     Resp 01/31/21 1952 (!) 22     Temp 01/31/21 1952 97.8 F (36.6 C)     Temp Source 01/31/21 1952 Oral     SpO2 01/31/21 1952 94 %     Weight 01/31/21 1938 165 lb (74.8 kg)     Height 01/31/21 1938 5\' 8"  (1.727 m)     Head Circumference --      Peak Flow --      Pain Score 01/31/21 1938 10     Pain Loc --      Pain Edu? --      Excl. in [place]? --    Constitutional: Alert and oriented. Well appearing and in no acute distress. Eyes: Conjunctivae are normal. PERRL. EOMI. Head: Atraumatic. Nose: No congestion/rhinnorhea. Neck: No stridor.  2 drains currently in place exiting the left side of the neck. Cardiovascular: Normal rate, regular rhythm. Grossly  normal heart sounds.  Good peripheral circulation. Respiratory: Normal respiratory effort.  No retractions. Lungs CTAB. Gastrointestinal: Hyperactive bowel sounds x4 quadrants.  Soft and without distention.  There is suprapubic tenderness as well as left lower quadrant tenderness.  No abdominal bruits. No CVA tenderness.  Rectal exam does reveal a hemorrhoid in approximately the 2 o'clock position without any evidence of current thrombosis.  There is no palpable stool in the rectal vault. Musculoskeletal: No lower extremity tenderness nor edema.  No joint effusions. Neurologic:  Normal speech and language. No gross focal neurologic deficits are appreciated. No gait instability. Skin:  Skin is warm, dry and intact. No rash noted. Psychiatric: Mood and affect are normal. Speech and behavior are normal.  ____________________________________________   LABS (all labs ordered are listed, but only abnormal results are displayed)  Labs Reviewed  URINALYSIS, COMPLETE (UACMP) WITH MICROSCOPIC - Abnormal; Notable for the following components:      Result Value   Color, Urine YELLOW (*)    APPearance HAZY (*)    Specific Gravity, Urine >1.046 (*)    Ketones, ur 5 (*)    Protein, ur 100 (*)    All other components within normal limits  COMPREHENSIVE METABOLIC PANEL - Abnormal; Notable for the following components:   Glucose, Bld 101 (*)    BUN 27 (*)    Creatinine, Ser 1.28 (*)    AST 44 (*)    ALT 48 (*)    All other components within normal limits  CBC WITH DIFFERENTIAL/PLATELET - Abnormal; Notable for the following components:   WBC 11.8 (*)    Hemoglobin 12.7 (*)    MCV 76.7 (*)    MCH 23.1 (*)    RDW 16.9 (*)    Neutro Abs 9.8 (*)    All other components within normal limits    ____________________________________________  RADIOLOGY I, Marlana Salvage, personally viewed and evaluated these images (plain radiographs) as part of my medical decision making, as well as reviewing the  written report by the radiologist.  ED provider interpretation: X-ray of the chest does not reveal any acute infiltrate, abdominal view does not reveal any obstructive gas pattern and there is  no significant rectal stool ball appreciated.  See radiology report for CT findings.  Official radiology report(s): DG Chest 2 View  Result Date: 01/31/2021 CLINICAL DATA:  Constipation. EXAM: CHEST - 2 VIEW COMPARISON:  Chest radiograph August 21, 2020. FINDINGS: The heart size and mediastinal contours are within normal limits. No focal consolidation. No pleural effusion. No pneumothorax. Right reverse shoulder arthroplasty. Surgical screws in the left femoral head. Generator overlies the right chest with leads extending cranially with tip obscured by collimation. Thoracic spondylosis. IMPRESSION: No active cardiopulmonary disease. Electronically Signed   By: Dahlia Bailiff MD   On: 01/31/2021 21:19   DG Abdomen 1 View  Result Date: 01/31/2021 CLINICAL DATA:  Constipation EXAM: ABDOMEN - 1 VIEW COMPARISON:  05/10/2018 FINDINGS: Nonobstructive bowel gas pattern. No significant fecal retention. No abnormal abdominopelvic calcifications are seen. Extensive spinal fusion hardware. No acute osseous findings. IMPRESSION: Nonobstructive bowel gas pattern.  No significant fecal retention. Electronically Signed   By: Davina Poke D.O.   On: 01/31/2021 20:08   CT ABDOMEN PELVIS W CONTRAST  Result Date: 01/31/2021 CLINICAL DATA:  Diverticulitis, constipation EXAM: CT ABDOMEN AND PELVIS WITH CONTRAST TECHNIQUE: Multidetector CT imaging of the abdomen and pelvis was performed using the standard protocol following bolus administration of intravenous contrast. CONTRAST:  131mL OMNIPAQUE IOHEXOL 300 MG/ML  SOLN COMPARISON:  05/10/2018 FINDINGS: Lower chest: Visualized lung bases are clear bilaterally. Extensive right coronary artery calcification. Global cardiac size within normal limits. No pericardial effusion.  Hepatobiliary: Stable tiny hypodensity within the left hepatic dome likely representing a tiny hepatic cyst. Liver otherwise unremarkable. Cholecystectomy has been performed. No intra or extrahepatic biliary ductal dilation. Pancreas: Unremarkable Spleen: Stable hypodensity within the a upper pole of the spleen most in keeping with a tiny cyst or hemangioma. The spleen is otherwise unremarkable. Adrenals/Urinary Tract: The adrenal glands are unremarkable. The kidneys are normal. There is a tiny focus of calcification seen within the anterosuperior bladder dome likely representing a calcification involving the urachal remnant, unchanged from prior examination. The bladder is otherwise unremarkable. Stomach/Bowel: Moderate stool within the rectal vault. Mild rectal wall thickening noted circumferentially suggesting changes of mild proctitis, possibly stercoral proctitis. No significant perirectal inflammatory stranding identified. The stomach, small bowel, and large bowel are otherwise unremarkable. Appendix normal. No evidence of obstruction or focal inflammation. No free intraperitoneal gas or fluid. Vascular/Lymphatic: Extensive aortoiliac atherosclerotic calcification. No evidence of aortic aneurysm. Extensive atherosclerotic calcification is particularly prominent at the origin of the mesenteric and renal arterial vasculature bilaterally. The degree of stenosis, however, is not well assessed on this non arteriographic study. Reproductive: Moderate enlargement the prostate gland with heterogeneous enhancement is again identified, similar to prior examination. Seminal vesicles are unremarkable. Other: Small fat containing right inguinal hernia. Musculoskeletal: Lumbar fusion with instrumentation and posterior decompression are again identified. No acute bone abnormality. No lytic or blastic bone lesions are identified. Degenerative changes are seen within the visualized thoracic spine. IMPRESSION: Moderate stool  within the rectal vault with mild circumferential rectal wall thickening suggesting mild proctitis, possibly stercoral proctitis. Extensive coronary artery calcification. Peripheral vascular disease with particularly prominent atherosclerotic calcification at the origin of the mesenteric and renal vasculature. There is clinical evidence of chronic mesenteric ischemia or hemodynamically significant renal artery stenosis, this could be better assessed with CT arteriography. Aortic Atherosclerosis (ICD10-I70.0). Electronically Signed   By: Fidela Salisbury MD   On: 01/31/2021 23:29   ____________________________________________   INITIAL IMPRESSION / ASSESSMENT AND PLAN / ED COURSE  As  part of my medical decision making, I reviewed the following data within the [facility] notes reviewed and incorporated, Labs reviewed, Patient signed out to Dr. Alfred Levins, Radiograph reviewed, Evaluated by EM attending initially Dr. Jari Pigg, then signed out to Dr. Alfred Levins and Notes from prior ED visits        Patient is a 70 year old male who presents to the emergency department for evaluation of constipation since surgery 1 week ago.  He has had tiny bowel movement today, but no significant bowel movement over the last week.  He has also endorsed urinary frequency, as well as intermittent shortness of breath.  See HPI for further details.  In triage, the patient is afebrile, satting well and mildly hypertensive.  On physical exam, the patient does have 2 drains still present on the left side of his neck from prior surgery 1 week ago.  Exam of the abdomen is soft and not distended, but there is suprapubic tenderness as well as left lower quadrant tenderness.  Rectal exam does reveal a hemorrhoid, though this is not thrombosed and there is no palpable stool in the rectal vault that I can appreciate.  Patient attempted urination given suprapubic tenderness and sensation that he needed to go, however was  unable to produce any urine.  Bladder scan was then performed showing greater than 100 mL remaining in the bladder, but patient remained unable to urinate.  Laboratory evaluation was then obtained including CBC, CMP showing mildly elevated white count, mild elevation in AST, ALT and mild elevation in creatinine to 1.28.  Initial x-ray of the chest does not reveal any focal infiltrate, x-ray of the abdomen does not reveal any appreciated increased stool burden.  Given the patient's difficulty with urination, left lower quadrant and suprapubic tenderness as well as no explanation for patient's symptoms on rectal exam as well as x-ray, CT abdomen pelvis with contrast was obtained.  This does demonstrate a moderate stool burden with associated mild proctitis.  Will attempt to relieve this with enema.  Following the CT, the patient was also able to urinate, and urinalysis is pending at this time.  The patient was also seen and examined by Dr. Jari Pigg, who agrees with plan of care.  Patient was signed out to oncoming provider, Dr. Alfred Levins.  He is pending urinalysis as well as trial of Fleet enema.  Current plan of care is to discharge the patient home if he is able to have a appropriate bowel movement, as well as to evaluate urine for any need for outpatient therapy.  Patient is stable at this time.      ____________________________________________   FINAL CLINICAL IMPRESSION(S) / ED DIAGNOSES  Final diagnoses:  Drug-induced constipation     ED Discharge Orders    None      *Please note:  Charles Choi was evaluated in Emergency Department on 02/01/2021 for the symptoms described in the history of present illness. He was evaluated in the context of the global COVID-19 pandemic, which necessitated consideration that the patient might be at risk for infection with the SARS-CoV-2 virus that causes COVID-19. Institutional protocols and algorithms that pertain to the evaluation of patients at risk for  COVID-19 are in a state of rapid change based on information released by regulatory bodies including the CDC and federal and state organizations. These policies and algorithms were followed during the patient's care in the ED.  Some ED evaluations and interventions may be delayed as a result of limited staffing  during and the pandemic.*   Note:  This document was prepared using Dragon voice recognition software and may include unintentional dictation errors.   Marlana Salvage, PA 02/01/21 [place], [place], MD 02/01/21 [phone]

## 2021-01-31 NOTE — ED Notes (Signed)
Patient transported to X-ray 

## 2021-01-31 NOTE — ED Notes (Signed)
Pt had small, runny, brown BM in pants while in triage. Attempted to get to toliet but could not. New brief and pants given to pt. Pt states to throw the old shorts away.

## 2021-01-31 NOTE — ED Notes (Signed)
Entered room to find pt awake and alert lying on L lateral side-- GCS 15.  Pt confirms constipation since surgery 1 week ago with JP drains x2 from L anterior neck region -- R side noted to have slightly more serosanguinous drainage than L however both noted to have scant amounts in each -- pt also confirms has been noticing less amount since surgery.  Pt does report small BM since arrival to ED - described as "pudding" like consistency.  Pt does state he feels like he still needs to pass more stool.  States pain has since decreased while lying on L lateral side.  Abdomen soft, nontender to palpation-- BS normactive x4 with auscultation.   Lung sounds CTA -- s1 and s2 regular upon auscultation.  Pt provided a few ice chips per request - no other needs verbalized at this time.  Call bell in reach.

## 2021-02-01 DIAGNOSIS — K5903 Drug induced constipation: Secondary | ICD-10-CM | POA: Diagnosis not present

## 2021-02-01 LAB — URINALYSIS, COMPLETE (UACMP) WITH MICROSCOPIC
Bacteria, UA: NONE SEEN
Bilirubin Urine: NEGATIVE
Glucose, UA: NEGATIVE mg/dL
Hgb urine dipstick: NEGATIVE
Ketones, ur: 5 mg/dL — AB
Leukocytes,Ua: NEGATIVE
Nitrite: NEGATIVE
Protein, ur: 100 mg/dL — AB
Specific Gravity, Urine: >1.046 — ABNORMAL HIGH (ref 1.005–1.030)
pH: 5 (ref 5.0–8.0)

## 2021-02-01 MED ORDER — LACTULOSE 10 GM/15ML PO SOLN: 30.0000 g | Freq: Two times a day (BID) | ORAL | 0 refills | Status: AC

## 2021-02-01 MED ORDER — LACTULOSE 10 GM/15ML PO SOLN
30.0000 g | Freq: Once | ORAL | Status: AC
  Administered 2021-02-01: 30 g via ORAL
  Filled 2021-02-01: qty 60

## 2021-02-01 NOTE — Discharge Instructions (Addendum)
Take 83mls of lactulose twice a day for 3-5 days. Drink plenty of water. You should be able to have several bowel movements with this medication. Follow up with your surgeon on your appointment tomorrow. Return to the ER for new or worsening abdominal pain, fever, or vomiting.

## 2021-04-12 ENCOUNTER — Other Ambulatory Visit: Payer: Self-pay | Admitting: *Deleted

## 2021-04-12 DIAGNOSIS — E291 Testicular hypofunction: Secondary | ICD-10-CM

## 2021-04-12 DIAGNOSIS — R972 Elevated prostate specific antigen [PSA]: Secondary | ICD-10-CM

## 2021-04-28 ENCOUNTER — Other Ambulatory Visit: Payer: Self-pay | Admitting: Urology

## 2021-04-28 DIAGNOSIS — N5201 Erectile dysfunction due to arterial insufficiency: Secondary | ICD-10-CM

## 2021-05-18 ENCOUNTER — Other Ambulatory Visit: Payer: Self-pay | Admitting: Urology

## 2021-05-21 ENCOUNTER — Other Ambulatory Visit: Payer: Self-pay | Admitting: Urology

## 2021-05-21 DIAGNOSIS — N5201 Erectile dysfunction due to arterial insufficiency: Secondary | ICD-10-CM

## 2021-05-26 ENCOUNTER — Other Ambulatory Visit: Payer: Self-pay

## 2021-05-28 ENCOUNTER — Ambulatory Visit: Payer: Self-pay | Admitting: Urology

## 2021-05-28 ENCOUNTER — Encounter: Payer: Self-pay | Admitting: Urology

## 2021-06-28 ENCOUNTER — Other Ambulatory Visit: Payer: Self-pay | Admitting: Urology

## 2021-06-28 DIAGNOSIS — N5201 Erectile dysfunction due to arterial insufficiency: Secondary | ICD-10-CM

## 2021-07-05 ENCOUNTER — Other Ambulatory Visit: Payer: Medicare Other

## 2021-07-05 ENCOUNTER — Other Ambulatory Visit: Payer: Self-pay

## 2021-07-05 DIAGNOSIS — E291 Testicular hypofunction: Secondary | ICD-10-CM

## 2021-07-05 DIAGNOSIS — R972 Elevated prostate specific antigen [PSA]: Secondary | ICD-10-CM

## 2021-07-06 LAB — TESTOSTERONE: Testosterone: 1014 ng/dL — ABNORMAL HIGH (ref 264–916)

## 2021-07-06 LAB — HEMATOCRIT: Hematocrit: 40.1 % (ref 37.5–51.0)

## 2021-07-06 LAB — PSA: Prostate Specific Ag, Serum: 2.8 ng/mL (ref 0.0–4.0)

## 2021-07-21 ENCOUNTER — Encounter: Payer: Self-pay | Admitting: Urology

## 2021-07-21 ENCOUNTER — Ambulatory Visit (INDEPENDENT_AMBULATORY_CARE_PROVIDER_SITE_OTHER): Payer: Medicare Other | Admitting: Urology

## 2021-07-21 ENCOUNTER — Other Ambulatory Visit: Payer: Self-pay

## 2021-07-21 VITALS — BP 136/54 | HR 79 | Ht 67.0 in | Wt 170.0 lb

## 2021-07-21 DIAGNOSIS — E291 Testicular hypofunction: Secondary | ICD-10-CM | POA: Diagnosis not present

## 2021-07-21 DIAGNOSIS — Z87442 Personal history of urinary calculi: Secondary | ICD-10-CM

## 2021-07-21 DIAGNOSIS — R972 Elevated prostate specific antigen [PSA]: Secondary | ICD-10-CM

## 2021-07-21 MED ORDER — TADALAFIL 20 MG PO TABS: 20.0000 mg | ORAL_TABLET | Freq: Every day | ORAL | 60 refills | Status: DC

## 2021-07-21 NOTE — Progress Notes (Signed)
07/21/2021 7:27 AM   Charles Choi February 26, 1951 [id number]  Referring provider: Idelle Crouch, MD [facility] [place],  [place] [address]  Chief Complaint  Patient presents with   Hypogonadism    Urologic history: 1.  Hypogonadism TRT 240 mg every 2 weeks Symptoms tiredness, fatigue, ED  2.  History nephrolithiasis   HPI: 70 y.o. male presents for annual follow-up.  Doing well with good energy level Labs 07/05/2021: Testosterone 1014 (drawn ~ 3 days after injection); PSA stable 2.8; hematocrit 40.1 No bothersome LUTS No dysuria, gross hematuria or flank/abdominal/pelvic pain   PMH: Past Medical History:  Diagnosis Date   Anxiety    Cancer ([place])    Depression    Hypertension    Parkinson's disease ([place])     Surgical History: Past Surgical History:  Procedure Laterality Date   BACK SURGERY     CHOLECYSTECTOMY     DEEP BRAIN STIMULATOR PLACEMENT     for Parkinson's disease   JOINT REPLACEMENT     left knee x2   ORCHIECTOMY  1983    Home Medications:  Allergies as of 07/21/2021       Reactions   Phenergan [promethazine Hcl] Other (See Comments)   Unknown    Reglan [metoclopramide] Other (See Comments)   Pass out         Medication List        Accurate as of July 21, 2021 11:59 PM. If you have any questions, ask your nurse or doctor.          acetaminophen 325 MG tablet Commonly known as: TYLENOL Take 925 mg by mouth every 6 (six) hours as needed for mild pain or moderate pain.   amantadine 100 MG capsule Commonly known as: SYMMETREL Take 1 capsule (100 mg total) by mouth daily.   aspirin 325 MG tablet Take 325 mg by mouth 2 (two) times daily.   atorvastatin 20 MG tablet Commonly known as: LIPITOR Take 1 tablet (20 mg total) by mouth daily.   carbidopa-levodopa 25-100 MG tablet Commonly known as: SINEMET IR Take 1 tablet orally two times a day at 7am, and 10pm. Take 0.5 tablet orally at 10am, 1pm,  4pm, and 7pm.   ketoconazole 2 % cream Commonly known as: NIZORAL Apply 1 application topically daily. Apply to flaky areas daily as needed   losartan 100 MG tablet Commonly known as: COZAAR Take 1 tablet (100 mg total) by mouth daily.   Melatonin 3 MG Caps Take 1-2 tablets by mouth 2 hours before bedtime   meloxicam 7.5 MG tablet Commonly known as: MOBIC Take 1 tablet (7.5 mg total) by mouth daily.   metoprolol succinate 25 MG 24 hr tablet Commonly known as: TOPROL-XL Take 1 tablet (25 mg total) by mouth 2 (two) times daily.   metoprolol tartrate 25 MG tablet Commonly known as: LOPRESSOR TAKE 1 TABLET TWICE DAILY   NON FORMULARY Diet Type: Regular   omeprazole 20 MG tablet Commonly known as: PRILOSEC OTC Take 40 mg by mouth daily.   omeprazole 40 MG capsule Commonly known as: PRILOSEC Take 1 capsule by mouth daily.   penicillin v potassium 500 MG tablet Commonly known as: VEETID Take 500 mg by mouth 4 (four) times daily.   Potassium Citrate 15 MEQ (1620 MG) Tbcr Take 1 tablet by mouth 2 (two) times daily.   ranolazine 500 MG 12 hr tablet Commonly known as: RANEXA   rOPINIRole 2 MG tablet Commonly known as:  REQUIP Take 1 tablet (2 mg total) by mouth every 3 (three) hours. At 7am, and 1pm.   selegiline 5 MG capsule Commonly known as: ELDEPRYL Take 1 capsule (5 mg total) by mouth 2 (two) times daily. At 7am and 1pm.   tadalafil 20 MG tablet Commonly known as: CIALIS Take 1 tablet (20 mg total) by mouth daily. What changed:  medication strength See the new instructions. Changed by: Abbie Sons, MD   testosterone cypionate 200 MG/ML injection Commonly known as: DEPOTESTOSTERONE CYPIONATE INJECT 1.2ML INTRAMUSCULARLY EVERY 14 DAYS   traZODone 150 MG tablet Commonly known as: DESYREL Take 0.5 tablets (75 mg total) by mouth at bedtime as needed.        Allergies:  Allergies  Allergen Reactions   Phenergan [Promethazine Hcl] Other (See  Comments)    Unknown    Reglan [Metoclopramide] Other (See Comments)    Pass out     Family History: No family history on file.  Social History:  reports that he has never smoked. He has never used smokeless tobacco. He reports current alcohol use. He reports current drug use. Drug: Marijuana.   Physical Exam: BP (!) 136/54   Pulse 79   Ht '5\' 7"'$  (1.702 m)   Wt 170 lb (77.1 kg)   BMI 26.63 kg/m   Constitutional:  Alert and oriented, No acute distress. HEENT: [place] AT, moist mucus membranes.  Trachea midline, no masses. Cardiovascular: No clubbing, cyanosis, or edema. Respiratory: Normal respiratory effort, no increased work of breathing.   Assessment & Plan:    1.  Hypogonadism Doing well on TRT Lab visit 6 months testosterone/hematocrit Office visit 1 year testosterone/hematocrit/PSA  2.  History urinary calculi Asymptomatic   Abbie Sons, MD  [place] [address] [place],  [address] [phone]

## 2021-07-25 ENCOUNTER — Encounter: Payer: Self-pay | Admitting: Urology

## 2021-10-04 ENCOUNTER — Other Ambulatory Visit: Payer: Self-pay | Admitting: Urology

## 2021-10-08 ENCOUNTER — Other Ambulatory Visit: Payer: Self-pay | Admitting: Urology

## 2021-12-01 ENCOUNTER — Other Ambulatory Visit: Payer: Self-pay

## 2021-12-01 ENCOUNTER — Encounter: Payer: Self-pay | Admitting: Dermatology

## 2021-12-01 ENCOUNTER — Ambulatory Visit (INDEPENDENT_AMBULATORY_CARE_PROVIDER_SITE_OTHER): Payer: Medicare Other | Admitting: Dermatology

## 2021-12-01 DIAGNOSIS — L814 Other melanin hyperpigmentation: Secondary | ICD-10-CM

## 2021-12-01 DIAGNOSIS — L821 Other seborrheic keratosis: Secondary | ICD-10-CM

## 2021-12-01 DIAGNOSIS — D1800 Hemangioma unspecified site: Secondary | ICD-10-CM

## 2021-12-01 DIAGNOSIS — B079 Viral wart, unspecified: Secondary | ICD-10-CM | POA: Diagnosis not present

## 2021-12-01 DIAGNOSIS — L219 Seborrheic dermatitis, unspecified: Secondary | ICD-10-CM | POA: Diagnosis not present

## 2021-12-01 DIAGNOSIS — D229 Melanocytic nevi, unspecified: Secondary | ICD-10-CM

## 2021-12-01 DIAGNOSIS — C44519 Basal cell carcinoma of skin of other part of trunk: Secondary | ICD-10-CM | POA: Diagnosis not present

## 2021-12-01 DIAGNOSIS — L570 Actinic keratosis: Secondary | ICD-10-CM | POA: Diagnosis not present

## 2021-12-01 DIAGNOSIS — Z1283 Encounter for screening for malignant neoplasm of skin: Secondary | ICD-10-CM | POA: Diagnosis not present

## 2021-12-01 DIAGNOSIS — L720 Epidermal cyst: Secondary | ICD-10-CM | POA: Diagnosis not present

## 2021-12-01 DIAGNOSIS — L578 Other skin changes due to chronic exposure to nonionizing radiation: Secondary | ICD-10-CM

## 2021-12-01 DIAGNOSIS — C4491 Basal cell carcinoma of skin, unspecified: Secondary | ICD-10-CM

## 2021-12-01 NOTE — Progress Notes (Signed)
New Patient Visit  Subjective  Charles Choi is a 71 y.o. male who presents for the following: Basal Cell Carcinoma (Pt had biopsies done with [facility] and had 3 bx proven BCC that need to be treated today. Pt also had a wart on right index finger tx'd with Ln2 a few weeks ago that is still on finger. Pt would like to be checked all over today. Hx of multiple BCCs. ). The patient presents for Total-Body Skin Exam (TBSE) for skin cancer screening and mole check.  The patient has spots, moles and lesions to be evaluated, some may be new or changing and the patient has concerns that these could be cancer.  Objective  Well appearing patient in no apparent distress; mood and affect are within normal limits.  A full examination was performed including scalp, head, eyes, ears, nose, lips, neck, chest, axillae, abdomen, back, buttocks, bilateral upper extremities, bilateral lower extremities, hands, feet, fingers, toes, fingernails, and toenails. All findings within normal limits unless otherwise noted below.  Nasolabial, left post ear Pink patches with greasy scale.    Left Ear x 1 Erythematous thin papules/macules with gritty scale.   right index finger Verrucous papules -- Discussed viral etiology and contagion.   Right mid Lower Back Biopsy proven BCC       left mid medial back Biopsy proven BCC       right medial mid chest Biopsy proven BCC       left infrascapular 3 x 2 cm Subcutaneous nodule.    Assessment & Plan  Seborrheic dermatitis Exacerbated by patient's Parkinson's disease Nasolabial, left post ear Start ketoconazole. Apply to affected areas on Mondays, Wednesdays, Fridays.  Start HC 2.5. Apply to affected areas Tuesdays, Thursdays, and Saturdays. Seborrheic Dermatitis  -  is a chronic persistent rash characterized by pinkness and scaling most commonly of the mid face but also can occur on the scalp (dandruff), ears; mid chest, mid  back and groin.  It tends to be exacerbated by stress and cooler weather.  People who have neurologic disease may experience new onset or exacerbation of existing seborrheic dermatitis.  The condition is not curable but treatable and can be controlled.  AK (actinic keratosis) Left Ear x 1 Actinic keratoses are precancerous spots that appear secondary to cumulative UV radiation exposure/sun exposure over time. They are chronic with expected duration over 1 year. A portion of actinic keratoses will progress to squamous cell carcinoma of the skin. It is not possible to reliably predict which spots will progress to skin cancer and so treatment is recommended to prevent development of skin cancer.  Recommend daily broad spectrum sunscreen SPF 30+ to sun-exposed areas, reapply every 2 hours as needed.  Recommend staying in the shade or wearing long sleeves, sun glasses (UVA+UVB protection) and wide brim hats (4-inch brim around the entire circumference of the hat). Call for new or changing lesions.  Prior to procedure, discussed risks of blister formation, small wound, skin dyspigmentation, or rare scar following cryotherapy. Recommend Vaseline ointment to treated areas while healing.  Destruction of lesion - Left Ear x 1 Complexity: simple   Destruction method: cryotherapy   Informed consent: discussed and consent obtained   Timeout:  patient name, date of birth, surgical site, and procedure verified Lesion destroyed using liquid nitrogen: Yes   Region frozen until ice ball extended beyond lesion: Yes   Outcome: patient tolerated procedure well with no complications   Post-procedure details: wound care instructions  given    Viral warts right index finger Discussed viral etiology and risk of spread.  Discussed multiple treatments may be required to clear warts.  Discussed possible post-treatment dyspigmentation and risk of recurrence.  Destruction of lesion - right index finger Complexity:  simple   Destruction method: cryotherapy   Informed consent: discussed and consent obtained   Timeout:  patient name, date of birth, surgical site, and procedure verified Lesion destroyed using liquid nitrogen: Yes   Region frozen until ice ball extended beyond lesion: Yes   Outcome: patient tolerated procedure well with no complications   Post-procedure details: wound care instructions given    Basal cell carcinoma (BCC), unspecified site (3) -biopsy-proven by a nurse practitioner who comes to [place] called [employer].  Report was reviewed today by me.  See report.  They have not been treated yet.  Right mid Lower Back Destruction of lesion Complexity: extensive   Destruction method: electrodesiccation and curettage   Informed consent: discussed and consent obtained   Timeout:  patient name, date of birth, surgical site, and procedure verified Procedure prep:  Patient was prepped and draped in usual sterile fashion Prep type:  Isopropyl alcohol Anesthesia: the lesion was anesthetized in a standard fashion   Anesthetic:  1% lidocaine w/ epinephrine 1-100,000 buffered w/ 8.4% NaHCO3 Curettage performed in three different directions: Yes   Electrodesiccation performed over the curetted area: Yes   Final wound size (cm):  2.2 Hemostasis achieved with:  pressure and aluminum chloride Outcome: patient tolerated procedure well with no complications   Post-procedure details: sterile dressing applied and wound care instructions given   Dressing type: bandage and petrolatum    left mid medial back Destruction of lesion Complexity: extensive   Destruction method: electrodesiccation and curettage   Informed consent: discussed and consent obtained   Timeout:  patient name, date of birth, surgical site, and procedure verified Procedure prep:  Patient was prepped and draped in usual sterile fashion Prep type:  Isopropyl alcohol Anesthesia: the lesion was  anesthetized in a standard fashion   Anesthetic:  1% lidocaine w/ epinephrine 1-100,000 buffered w/ 8.4% NaHCO3 Curettage performed in three different directions: Yes   Electrodesiccation performed over the curetted area: Yes   Final wound size (cm):  1.6 Hemostasis achieved with:  pressure and aluminum chloride Outcome: patient tolerated procedure well with no complications   Post-procedure details: sterile dressing applied and wound care instructions given   Dressing type: bandage and petrolatum    right medial mid chest Destruction of lesion Complexity: extensive   Destruction method: electrodesiccation and curettage   Informed consent: discussed and consent obtained   Timeout:  patient name, date of birth, surgical site, and procedure verified Procedure prep:  Patient was prepped and draped in usual sterile fashion Prep type:  Isopropyl alcohol Anesthesia: the lesion was anesthetized in a standard fashion   Anesthetic:  1% lidocaine w/ epinephrine 1-100,000 buffered w/ 8.4% NaHCO3 Curettage performed in three different directions: Yes   Electrodesiccation performed over the curetted area: Yes   Final wound size (cm):  1.6 Hemostasis achieved with:  pressure and aluminum chloride Outcome: patient tolerated procedure well with no complications   Post-procedure details: sterile dressing applied and wound care instructions given   Dressing type: bandage and petrolatum    Epidermal cyst left infrascapular Discussed benign and if becomes bothersome can be removed with in office excision. Pt defers.  Lentigines - Scattered tan macules - Due to sun exposure - Benign-appearing,  observe - Recommend daily broad spectrum sunscreen SPF 30+ to sun-exposed areas, reapply every 2 hours as needed. - Call for any changes  Seborrheic Keratoses - Stuck-on, waxy, tan-brown papules and/or plaques  - Benign-appearing - Discussed benign etiology and prognosis. - Observe - Call for any  changes  Melanocytic Nevi - Tan-brown and/or pink-flesh-colored symmetric macules and papules - Benign appearing on exam today - Observation - Call clinic for new or changing moles - Recommend daily use of broad spectrum spf 30+ sunscreen to sun-exposed areas.   Hemangiomas - Red papules - Discussed benign nature - Observe - Call for any changes  Actinic Damage - Chronic condition, secondary to cumulative UV/sun exposure - diffuse scaly erythematous macules with underlying dyspigmentation - Recommend daily broad spectrum sunscreen SPF 30+ to sun-exposed areas, reapply every 2 hours as needed.  - Staying in the shade or wearing long sleeves, sun glasses (UVA+UVB protection) and wide brim hats (4-inch brim around the entire circumference of the hat) are also recommended for sun protection.  - Call for new or changing lesions.  Skin cancer screening performed today.  History of Basal Cell Carcinoma of the Skin - No evidence of recurrence today - Recommend regular full body skin exams - Recommend daily broad spectrum sunscreen SPF 30+ to sun-exposed areas, reapply every 2 hours as needed.  - Call if any new or changing lesions are noted between office visits  History of Squamous Cell Carcinoma of the Skin - No evidence of recurrence today - No lymphadenopathy - Recommend regular full body skin exams - Recommend daily broad spectrum sunscreen SPF 30+ to sun-exposed areas, reapply every 2 hours as needed.  - Call if any new or changing lesions are noted between office visits  Return in about 4 months (around 03/31/2022) for recheck back.  IHarriett Sine, CMA, am acting as scribe for Sarina Ser, MD. Documentation: I have reviewed the above documentation for accuracy and completeness, and I agree with the above.  Sarina Ser, MD

## 2021-12-01 NOTE — Patient Instructions (Addendum)
Start ketoconazole 2% cream. Apply to affected areas on Mondays, Wednesdays, Fridays.   Start Hydrocortisone 2.5%. Apply to affected areas Tuesdays, Thursdays, and Saturdays.    Electrodesiccation and Curettage (Scrape and Burn) Wound Care Instructions  Leave the original bandage on for 24 hours if possible.  If the bandage becomes soaked or soiled before that time, it is OK to remove it and examine the wound.  A small amount of post-operative bleeding is normal.  If excessive bleeding occurs, remove the bandage, place gauze over the site and apply continuous pressure (no peeking) over the area for 30 minutes. If this does not work, please call our clinic as soon as possible or page your doctor if it is after hours.   Once a day, cleanse the wound with soap and water. It is fine to shower. If a thick crust develops you may use a Q-tip dipped into dilute hydrogen peroxide (mix 1:1 with water) to dissolve it.  Hydrogen peroxide can slow the healing process, so use it only as needed.    After washing, apply petroleum jelly (Vaseline) or an antibiotic ointment if your doctor prescribed one for you, followed by a bandage.    For best healing, the wound should be covered with a layer of ointment at all times. If you are not able to keep the area covered with a bandage to hold the ointment in place, this may mean re-applying the ointment several times a day.  Continue this wound care until the wound has healed and is no longer open. It may take several weeks for the wound to heal and close.  Itching and mild discomfort is normal during the healing process.  If you have any discomfort, you can take Tylenol (acetaminophen) or ibuprofen as directed on the bottle. (Please do not take these if you have an allergy to them or cannot take them for another reason).  Some redness, tenderness and white or yellow material in the wound is normal healing.  If the area becomes very sore and red, or develops a thick  yellow-green material (pus), it may be infected; please notify us.    Wound healing continues for up to one year following surgery. It is not unusual to experience pain in the scar from time to time during the interval.  If the pain becomes severe or the scar thickens, you should notify the office.    A slight amount of redness in a scar is expected for the first six months.  After six months, the redness will fade and the scar will soften and fade.  The color difference becomes less noticeable with time.  If there are any problems, return for a post-op surgery check at your earliest convenience.  To improve the appearance of the scar, you can use silicone scar gel, cream, or sheets (such as Mederma or Serica) every night for up to one year. These are available over the counter (without a prescription).  Please call our office at [phone] for any questions or concerns.  If You Need Anything After Your Visit  If you have any questions or concerns for your doctor, please call our main line at [phone] and press option 4 to reach your doctor's medical assistant. If no one answers, please leave a voicemail as directed and we will return your call as soon as possible. Messages left after 4 pm will be answered the following business day.   You may also send [place] a message via [place]. We typically respond to  MyChart messages within 1-2 business days.  For prescription refills, please ask your pharmacy to contact our office. Our fax number is [phone].  If you have an urgent issue when the clinic is closed that cannot wait until the next business day, you can page your doctor at the number below.    Please note that while we do our best to be available for urgent issues outside of office hours, we are not available 24/7.   If you have an urgent issue and are unable to reach [place], you may choose to seek medical care at your doctor's office, retail clinic, urgent care center, or emergency  room.  If you have a medical emergency, please immediately call 911 or go to the emergency department.  Pager Numbers  - Dr. Nehemiah Massed: [phone]  - Dr. Laurence Ferrari: [phone]  - Dr. Nicole Kindred: [phone]  In the event of inclement weather, please call our main line at [phone] for an update on the status of any delays or closures.  Dermatology Medication Tips: Please keep the boxes that topical medications come in in order to help keep track of the instructions about where and how to use these. Pharmacies typically print the medication instructions only on the boxes and not directly on the medication tubes.   If your medication is too expensive, please contact our office at [phone] option 4 or send [place] a message through [place].   We are unable to tell what your co-pay for medications will be in advance as this is different depending on your insurance coverage. However, we may be able to find a substitute medication at lower cost or fill out paperwork to get insurance to cover a needed medication.   If a prior authorization is required to get your medication covered by your insurance company, please allow [place] 1-2 business days to complete this process.  Drug prices often vary depending on where the prescription is filled and some pharmacies may offer cheaper prices.  The website www.goodrx.com contains coupons for medications through different pharmacies. The prices here do not account for what the cost may be with help from insurance (it may be cheaper with your insurance), but the website can give you the price if you did not use any insurance.  - You can print the associated coupon and take it with your prescription to the pharmacy.  - You may also stop by our office during regular business hours and pick up a GoodRx coupon card.  - If you need your prescription sent electronically to a different pharmacy, notify our office through [facility] or by phone at [phone]  option 4.     Si Usted Necesita Algo Despus de Su Visita  Tambin puede enviarnos un mensaje a travs de Pharmacist, community. Por lo general respondemos a los mensajes de MyChart en el transcurso de 1 a 2 das hbiles.  Para renovar recetas, por favor pida a su farmacia que se ponga en contacto con nuestra oficina. Harland Dingwall de fax es [place] [phone].  Si tiene un asunto urgente cuando la clnica est cerrada y que no puede esperar hasta el siguiente da hbil, puede llamar/localizar a su doctor(a) al nmero que aparece a continuacin.   Por favor, tenga en cuenta que aunque hacemos todo lo posible para estar disponibles para asuntos urgentes fuera del horario de [place], no estamos disponibles las 24 horas del da, los 7 das de la [place].   Si tiene un problema urgente y no puede comunicarse con nosotros, puede  optar por buscar atencin mdica  en el consultorio de su doctor(a), en una clnica privada, en un centro de atencin urgente o en una sala de emergencias.  Si tiene Engineering geologist, por favor llame inmediatamente al 911 o vaya a la sala de emergencias.  Nmeros de bper  - Dr. Nehemiah Massed: [phone]  - Dra. Moye: [phone]  - Dra. Nicole Kindred: [phone]  En caso de inclemencias del [place], por favor llame a Johnsie Kindred principal al [phone] para una actualizacin sobre el [place] de cualquier retraso o cierre.  Consejos para la medicacin en dermatologa: Por favor, guarde las cajas en las que vienen los medicamentos de uso tpico para ayudarle a seguir las instrucciones sobre dnde y cmo usarlos. Las farmacias generalmente imprimen las instrucciones del medicamento slo en las cajas y no directamente en los tubos del [place].   Si su medicamento es muy caro, por favor, pngase en contacto con Zigmund Daniel llamando al [phone] y presione la opcin 4 o envenos un mensaje a travs de Pharmacist, community.   No podemos decirle cul ser su copago por los medicamentos  por adelantado ya que esto es diferente dependiendo de la cobertura de su seguro. Sin embargo, es posible que podamos encontrar un medicamento sustituto a Electrical engineer un formulario para que el seguro cubra el medicamento que se considera necesario.   Si se requiere una autorizacin previa para que su compaa de seguros [place] su medicamento, por favor permtanos de 1 a 2 das hbiles para completar este proceso.  Los precios de los medicamentos varan con frecuencia dependiendo del Environmental consultant de dnde se surte la receta y alguna farmacias pueden ofrecer precios ms baratos.  El sitio web www.goodrx.com tiene cupones para medicamentos de Airline pilot. Los precios aqu no tienen en cuenta lo que podra costar con la ayuda del seguro (puede ser ms barato con su seguro), pero el sitio web puede darle el precio si no utiliz Research scientist (physical sciences).  - Puede imprimir el cupn correspondiente y llevarlo con su receta a la farmacia.  - Tambin puede pasar por nuestra oficina durante el horario de atencin regular y Charity fundraiser una tarjeta de cupones de GoodRx.  - Si necesita que su receta se enve electrnicamente a una farmacia diferente, informe a nuestra oficina a travs de MyChart de [place] o por telfono llamando al [phone] y presione la opcin 4.

## 2021-12-05 ENCOUNTER — Encounter: Payer: Self-pay | Admitting: Dermatology

## 2022-01-11 ENCOUNTER — Emergency Department
Admission: EM | Admit: 2022-01-11 | Discharge: 2022-01-11 | Disposition: A | Discharge status: Home / Self Care | Payer: Medicare Other | Attending: Student in an Organized Health Care Education/Training Program | Admitting: Student in an Organized Health Care Education/Training Program

## 2022-01-11 ENCOUNTER — Emergency Department: Payer: Medicare Other

## 2022-01-11 ENCOUNTER — Other Ambulatory Visit: Payer: Self-pay

## 2022-01-11 ENCOUNTER — Encounter: Payer: Self-pay | Admitting: Emergency Medicine

## 2022-01-11 DIAGNOSIS — S43004A Unspecified dislocation of right shoulder joint, initial encounter: Secondary | ICD-10-CM | POA: Insufficient documentation

## 2022-01-11 DIAGNOSIS — X500XXA Overexertion from strenuous movement or load, initial encounter: Secondary | ICD-10-CM | POA: Diagnosis not present

## 2022-01-11 DIAGNOSIS — S4991XA Unspecified injury of right shoulder and upper arm, initial encounter: Secondary | ICD-10-CM | POA: Diagnosis present

## 2022-01-11 MED ORDER — FENTANYL CITRATE PF 50 MCG/ML IJ SOSY: 50.0000 ug | PREFILLED_SYRINGE | Freq: Once | INTRAMUSCULAR | Status: DC

## 2022-01-11 MED ORDER — PROPOFOL 10 MG/ML IV BOLUS
INTRAVENOUS | Status: AC | PRN
  Administered 2022-01-11: 40 mg via INTRAVENOUS

## 2022-01-11 MED ORDER — FENTANYL CITRATE PF 50 MCG/ML IJ SOSY
PREFILLED_SYRINGE | INTRAMUSCULAR | Status: AC
  Filled 2022-01-11: qty 1

## 2022-01-11 MED ORDER — FENTANYL CITRATE PF 50 MCG/ML IJ SOSY: 75.0000 ug | PREFILLED_SYRINGE | Freq: Once | INTRAMUSCULAR | Status: AC

## 2022-01-11 MED ORDER — FENTANYL CITRATE (PF) 100 MCG/2ML IJ SOLN
INTRAMUSCULAR | Status: AC | PRN
  Administered 2022-01-11: 50 ug via INTRAVENOUS

## 2022-01-11 MED ORDER — PROPOFOL 10 MG/ML IV BOLUS
0.5000 mg/kg | Freq: Once | INTRAVENOUS | Status: AC
  Administered 2022-01-11: 37.4 mg via INTRAVENOUS
  Filled 2022-01-11: qty 20

## 2022-01-11 MED ORDER — FENTANYL CITRATE PF 50 MCG/ML IJ SOSY
PREFILLED_SYRINGE | INTRAMUSCULAR | Status: AC
  Administered 2022-01-11: 75 ug
  Filled 2022-01-11: qty 2

## 2022-01-11 MED ORDER — SODIUM CHLORIDE 0.9 % IV BOLUS
500.0000 mL | Freq: Once | INTRAVENOUS | Status: AC
  Administered 2022-01-11: 500 mL via INTRAVENOUS

## 2022-01-11 NOTE — ED Provider Notes (Signed)
? ?[facility] ?Provider Note ? ? ? Event Date/Time  ? First MD Initiated Contact with Patient 01/11/22 2030   ?  (approximate) ? ? ?History  ? ?Shoulder Injury ? ? ?HPI ? ?Charles Choi is a 71 y.o. 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  No numbness or tingling.  Denies any other pain or discomfort.  ?4 ?  ? ? ?Physical Exam  ? ?Triage Vital Signs: ?ED Triage Vitals  ?Enc Vitals Group  ?   BP 01/11/22 1947 (!) 126/57  ?   Pulse Rate 01/11/22 1947 88  ?   Resp 01/11/22 1947 16  ?   Temp 01/11/22 1947 98.7 ?F (37.1 ?C)  ?   Temp Source 01/11/22 1947 Oral  ?   SpO2 01/11/22 1947 98 %  ?   Weight 01/11/22 1942 165 lb (74.8 kg)  ?   Height 01/11/22 1942 '5\' 7"'$  (1.702 m)  ?   Head Circumference --   ?   Peak Flow --   ?   Pain Score 01/11/22 1942 6  ?   Pain Loc --   ?   Pain Edu? --   ?   Excl. in [place]? --   ? ? ?Most recent vital signs: ?Vitals:  ? 01/11/22 2154 01/11/22 2200  ?BP: (!) 142/71 139/72  ?Pulse: 74 73  ?Resp: 12 15  ?Temp:  98.7 ?F (37.1 ?C)  ?SpO2: 99% 99%  ? ? ? ?Constitutional: Alert  ?Eyes: Conjunctivae are normal.  ?Head: Atraumatic. ?Nose: No congestion/rhinnorhea. ?Mouth/Throat: Mucous membranes are moist.   ?Neck: Painless ROM.  ?Cardiovascular:   Good peripheral circulation. ?Respiratory: Normal respiratory effort.  No retractions.  ?Gastrointestinal: Soft and nontender.  ?Musculoskeletal:  right shoulder deformity c/w anterior dislocation. No pain distally  n/v intact ?Neurologic:  MAE spontaneously. No gross focal neurologic deficits are appreciated.  ?Skin:  Skin is warm, dry and intact. No rash noted. ?Psychiatric: Mood and affect are normal. Speech and behavior are normal. ? ? ? ?ED Results / Procedures / Treatments  ? ?Labs ?(all labs ordered  are listed, but only abnormal results are displayed) ?Labs Reviewed - No data to display ? ? ?EKG ? ? ? ? ?RADIOLOGY ?Please see ED Course for my review and interpretation. ? ?I personally reviewed all radiographic images ordered to evaluate for the above acute complaints and reviewed radiology reports and findings.  These findings were personally discussed with the patient.  Please see medical record for radiology report. ? ? ? ?PROCEDURES: ? ?Critical Care performed: No ? ?.Ortho Injury Treatment ? ?Date/Time: 01/11/2022 11:00 PM ?Performed by: Merlyn Lot, MD ?Authorized by: Merlyn Lot, MD  ? ?Consent:  ?  Consent obtained:  Verbal ?  Consent given by:  Patient ?  Risks discussed:  Fracture, irreducible dislocation, recurrent dislocation, nerve damage, restricted joint movement, stiffness and vascular damage ?  Alternatives discussed:  Alternative treatment and referralInjury location: shoulder ?Location details: right shoulder ?Injury type: dislocation ?Dislocation type: anterior ?Hill-Sachs deformity: no ?Chronicity: recurrent ?Pre-procedure neurovascular assessment: neurovascularly intact ?Pre-procedure range of motion: reduced ? ?Patient sedated: Yes. Refer to sedation procedure documentation for details of sedation. ?Manipulation performed: yes ?Reduction method: traction and counter traction, scapular manipulation and external rotation ?Reduction successful: yes ?X-ray confirmed  reduction: yes ?Immobilization: sling ?Post-procedure neurovascular assessment: post-procedure neurovascularly intact ? ? ?Marland KitchenSedation ? ?Date/Time: 01/11/2022 11:02 PM ?Performed by: Merlyn Lot, MD ?Authorized by: Merlyn Lot, MD  ? ?Consent:  ?  Consent obtained:  Verbal ?  Consent given by:  Patient ?  Risks discussed:  Allergic reaction, dysrhythmia, inadequate sedation, nausea, vomiting, prolonged sedation necessitating reversal, respiratory compromise necessitating ventilatory assistance and intubation and  prolonged hypoxia resulting in organ damage ?Universal protocol:  ?  Immediately prior to procedure, a time out was called: yes   ?Pre-sedation assessment:  ?  Time since last food or drink:  4hrs ?  ASA classification: class 2 - patient with mild systemic disease   ?  Mouth opening:  3 or more finger widths ?  Thyromental distance:  3 finger widths ?  Mallampati score:  I - soft palate, uvula, fauces, pillars visible ?  Neck mobility: normal   ?  Pre-sedation assessments completed and reviewed: airway patency, cardiovascular function, hydration status, mental status, nausea/vomiting, pain level, respiratory function and temperature   ?Immediate pre-procedure details:  ?  Reviewed: vital signs, relevant labs/tests and NPO status   ?  Verified: bag valve mask available, emergency equipment available, intubation equipment available, IV patency confirmed, oxygen available and suction available   ?Procedure details (see MAR for exact dosages):  ?  Preoxygenation:  Nasal cannula ?  Sedation:  Propofol ?  Intended level of sedation: deep ?  Analgesia:  Fentanyl ?  Intra-procedure events: none   ?  Total Provider sedation time (minutes):  10 ?Post-procedure details:  ?  Attendance: Constant attendance by certified staff until patient recovered   ?  Recovery: Patient returned to pre-procedure baseline   ?  Procedure completion:  Tolerated well, no immediate complications ? ? ?MEDICATIONS ORDERED IN ED: ?Medications  ?fentaNYL (SUBLIMAZE) injection 50 mcg (50 mcg Intravenous Not Given 01/11/22 2156)  ?propofol (DIPRIVAN) 10 mg/mL bolus/IV push 37.4 mg (37.4 mg Intravenous Given 01/11/22 2145)  ?sodium chloride 0.9 % bolus 500 mL (0 mLs Intravenous Stopped 01/11/22 2209)  ?propofol (DIPRIVAN) 10 mg/mL bolus/IV push (40 mg Intravenous Given 01/11/22 2151)  ?fentaNYL (SUBLIMAZE) injection (50 mcg Intravenous Given 01/11/22 2145)  ?fentaNYL (SUBLIMAZE) injection 75 mcg (75 mcg Intravenous Given 01/11/22 2047)  ? ? ? ?IMPRESSION / MDM /  ASSESSMENT AND PLAN / ED COURSE  ?I reviewed the triage vital signs and the nursing notes. ?             ?               ? ?Differential diagnosis includes, but is not limited to, fracture, dislocation, contusion ? ?Patient presenting with acute dislocation of right shoulder in setting of previous right total shoulder revision.  No other associated injury.  Will plan sedation and reduction. ? ? ?Clinical Course as of 01/11/22 2304  ?Tue Jan 11, 2022  ?2208 On my review should xr there is successful reduction of right shoulder. [PR]  ?2257 Patient reassessed.  N/v intact.  Pain controlled. Stable and appropriate for follow up with [facility]. [PR]  ?  ?Clinical Course User Index ?[PR] Merlyn Lot, MD  ? ? ? ?FINAL CLINICAL IMPRESSION(S) / ED DIAGNOSES  ? ?Final diagnoses:  ?Shoulder dislocation, right, initial encounter  ? ? ? ?Rx / DC Orders  ? ?ED Discharge Orders   ? ? None  ? ?  ? ? ? ?Note:  This document was prepared using Systems analyst and may include  unintentional dictation errors. ? ?  ?Merlyn Lot, MD ?01/11/22 2304 ? ?

## 2022-01-11 NOTE — ED Triage Notes (Signed)
Pt to ED from home c/o right shoulder pain, states lifted his arm today after 1900 and felt like it dislocated.  States hx of shoulder surgeries and dislocations to this shoulder.  Hx of Parkinson's.  Skin WNL, strong radial pulses bilaterally, (+) movement to fingers. ?

## 2022-01-18 ENCOUNTER — Other Ambulatory Visit: Payer: Self-pay

## 2022-01-18 ENCOUNTER — Other Ambulatory Visit: Payer: Medicare Other

## 2022-01-18 DIAGNOSIS — Z87442 Personal history of urinary calculi: Secondary | ICD-10-CM

## 2022-01-18 DIAGNOSIS — E291 Testicular hypofunction: Secondary | ICD-10-CM

## 2022-01-19 ENCOUNTER — Ambulatory Visit
Admission: RE | Admit: 2022-01-19 | Discharge: 2022-01-19 | Disposition: A | Discharge status: Home / Self Care | Payer: Medicare Other | Source: Ambulatory Visit | Attending: Physician Assistant | Admitting: Physician Assistant

## 2022-01-19 ENCOUNTER — Other Ambulatory Visit: Payer: Self-pay | Admitting: Physician Assistant

## 2022-01-19 DIAGNOSIS — M25511 Pain in right shoulder: Secondary | ICD-10-CM | POA: Insufficient documentation

## 2022-01-19 LAB — PSA: Prostate Specific Ag, Serum: 3.3 ng/mL (ref 0.0–4.0)

## 2022-01-19 LAB — HEMATOCRIT: Hematocrit: 43.9 % (ref 37.5–51.0)

## 2022-01-19 LAB — TESTOSTERONE: Testosterone: 1319 ng/dL — ABNORMAL HIGH (ref 264–916)

## 2022-01-20 ENCOUNTER — Encounter: Payer: Self-pay | Admitting: *Deleted

## 2022-01-20 ENCOUNTER — Telehealth: Payer: Self-pay | Admitting: Urology

## 2022-01-20 NOTE — Telephone Encounter (Signed)
Testosterone level significantly elevated at 1310.  Please find out when his last injection was performed prior to this blood draw. ?

## 2022-03-09 ENCOUNTER — Telehealth: Payer: Self-pay | Admitting: *Deleted

## 2022-03-09 NOTE — Telephone Encounter (Signed)
Pt calling asking to be put back on Testosterone. Pt states he has some things going on and he got off track. Pt lives at [facility] and will be receiving his injections there. Please advise if pt needs OV, LAB or just another RX. ?

## 2022-03-10 DIAGNOSIS — R7303 Prediabetes: Secondary | ICD-10-CM | POA: Insufficient documentation

## 2022-03-13 MED ORDER — TESTOSTERONE CYPIONATE 200 MG/ML IM SOLN: 200.0000 mg | INTRAMUSCULAR | 0 refills | Status: DC

## 2022-03-14 NOTE — Telephone Encounter (Signed)
Notified patient as instructed, patient pleased. Discussed follow-up appointments, patient agrees  

## 2022-04-06 ENCOUNTER — Ambulatory Visit (INDEPENDENT_AMBULATORY_CARE_PROVIDER_SITE_OTHER): Payer: Medicare Other | Admitting: Dermatology

## 2022-04-06 DIAGNOSIS — C44519 Basal cell carcinoma of skin of other part of trunk: Secondary | ICD-10-CM

## 2022-04-06 DIAGNOSIS — L820 Inflamed seborrheic keratosis: Secondary | ICD-10-CM

## 2022-04-06 DIAGNOSIS — D489 Neoplasm of uncertain behavior, unspecified: Secondary | ICD-10-CM

## 2022-04-06 DIAGNOSIS — L578 Other skin changes due to chronic exposure to nonionizing radiation: Secondary | ICD-10-CM | POA: Diagnosis not present

## 2022-04-06 DIAGNOSIS — L720 Epidermal cyst: Secondary | ICD-10-CM | POA: Diagnosis not present

## 2022-04-06 DIAGNOSIS — Z85828 Personal history of other malignant neoplasm of skin: Secondary | ICD-10-CM

## 2022-04-06 DIAGNOSIS — L821 Other seborrheic keratosis: Secondary | ICD-10-CM

## 2022-04-06 NOTE — Patient Instructions (Signed)
  Biopsy Wound Care Instructions  Leave the original bandage on for 24 hours if possible.  If the bandage becomes soaked or soiled before that time, it is OK to remove it and examine the wound.  A small amount of post-operative bleeding is normal.  If excessive bleeding occurs, remove the bandage, place gauze over the site and apply continuous pressure (no peeking) over the area for 30 minutes. If this does not work, please call our clinic as soon as possible or page your doctor if it is after hours.   Once a day, cleanse the wound with soap and water. It is fine to shower. If a thick crust develops you may use a Q-tip dipped into dilute hydrogen peroxide (mix 1:1 with water) to dissolve it.  Hydrogen peroxide can slow the healing process, so use it only as needed.    After washing, apply petroleum jelly (Vaseline) or an antibiotic ointment if your doctor prescribed one for you, followed by a bandage.    For best healing, the wound should be covered with a layer of ointment at all times. If you are not able to keep the area covered with a bandage to hold the ointment in place, this may mean re-applying the ointment several times a day.  Continue this wound care until the wound has healed and is no longer open.   Itching and mild discomfort is normal during the healing process. However, if you develop pain or severe itching, please call our office.   If you have any discomfort, you can take Tylenol (acetaminophen) or ibuprofen as directed on the bottle. (Please do not take these if you have an allergy to them or cannot take them for another reason).  Some redness, tenderness and white or yellow material in the wound is normal healing.  If the area becomes very sore and red, or develops a thick yellow-green material (pus), it may be infected; please notify us.    If you have stitches, return to clinic as directed to have the stitches removed. You will continue wound care for 2-3 days after the  stitches are removed.   Wound healing continues for up to one year following surgery. It is not unusual to experience pain in the scar from time to time during the interval.  If the pain becomes severe or the scar thickens, you should notify the office.    A slight amount of redness in a scar is expected for the first six months.  After six months, the redness will fade and the scar will soften and fade.  The color difference becomes less noticeable with time.  If there are any problems, return for a post-op surgery check at your earliest convenience.  To improve the appearance of the scar, you can use silicone scar gel, cream, or sheets (such as Mederma or Serica) every night for up to one year. These are available over the counter (without a prescription).  Please call our office at [phone] for any questions or concerns.    Electrodesiccation and Curettage ("Scrape and Burn") Wound Care Instructions  Leave the original bandage on for 24 hours if possible.  If the bandage becomes soaked or soiled before that time, it is OK to remove it and examine the wound.  A small amount of post-operative bleeding is normal.  If excessive bleeding occurs, remove the bandage, place gauze over the site and apply continuous pressure (no peeking) over the area for 30 minutes. If this does not work,   our clinic as soon as possible or page your doctor if it is after hours.   Once a day, cleanse the wound with soap and water. It is fine to shower. If a thick crust develops you may use a Q-tip dipped into dilute hydrogen peroxide (mix 1:1 with water) to dissolve it.  Hydrogen peroxide can slow the healing process, so use it only as needed.    After washing, apply petroleum jelly (Vaseline) or an antibiotic ointment if your doctor prescribed one for you, followed by a bandage.    For best healing, the wound should be covered with a layer of ointment at all times. If you are not able to keep the area covered  with a bandage to hold the ointment in place, this may mean re-applying the ointment several times a day.  Continue this wound care until the wound has healed and is no longer open. It may take several weeks for the wound to heal and close.  Itching and mild discomfort is normal during the healing process.  If you have any discomfort, you can take Tylenol (acetaminophen) or ibuprofen as directed on the bottle. (Please do not take these if you have an allergy to them or cannot take them for another reason).  Some redness, tenderness and white or yellow material in the wound is normal healing.  If the area becomes very sore and red, or develops a thick yellow-green material (pus), it may be infected; please notify us.    Wound healing continues for up to one year following surgery. It is not unusual to experience pain in the scar from time to time during the interval.  If the pain becomes severe or the scar thickens, you should notify the office.    A slight amount of redness in a scar is expected for the first six months.  After six months, the redness will fade and the scar will soften and fade.  The color difference becomes less noticeable with time.  If there are any problems, return for a post-op surgery check at your earliest convenience.  To improve the appearance of the scar, you can use silicone scar gel, cream, or sheets (such as Mederma or Serica) every night for up to one year. These are available over the counter (without a prescription).  Please call our office at [phone] for any questions or concerns.   If You Need Anything After Your Visit  If you have any questions or concerns for your doctor, please call our main line at [phone] and press option 4 to reach your doctor's medical assistant. If no one answers, please leave a voicemail as directed and we will return your call as soon as possible. Messages left after 4 pm will be answered the following business day.   You  may also send [place] a message via [place]. We typically respond to MyChart messages within 1-2 business days.  For prescription refills, please ask your pharmacy to contact our office. Our fax number is [phone].  If you have an urgent issue when the clinic is closed that cannot wait until the next business day, you can page your doctor at the number below.    Please note that while we do our best to be available for urgent issues outside of office hours, we are not available 24/7.   If you have an urgent issue and are unable to reach [place], you may choose to seek medical care at your doctor's office, retail clinic, urgent care center,  or emergency room.  If you have a medical emergency, please immediately call 911 or go to the emergency department.  Pager Numbers  - Dr. Nehemiah Massed: [phone]  - Dr. Laurence Ferrari: [phone]  - Dr. Nicole Kindred: [phone]  In the event of inclement weather, please call our main line at [phone] for an update on the status of any delays or closures.  Dermatology Medication Tips: Please keep the boxes that topical medications come in in order to help keep track of the instructions about where and how to use these. Pharmacies typically print the medication instructions only on the boxes and not directly on the medication tubes.   If your medication is too expensive, please contact our office at [phone] option 4 or send [place] a message through [place].   We are unable to tell what your co-pay for medications will be in advance as this is different depending on your insurance coverage. However, we may be able to find a substitute medication at lower cost or fill out paperwork to get insurance to cover a needed medication.   If a prior authorization is required to get your medication covered by your insurance company, please allow [place] 1-2 business days to complete this process.  Drug prices often vary depending on where the prescription is filled and some  pharmacies may offer cheaper prices.  The website www.goodrx.com contains coupons for medications through different pharmacies. The prices here do not account for what the cost may be with help from insurance (it may be cheaper with your insurance), but the website can give you the price if you did not use any insurance.  - You can print the associated coupon and take it with your prescription to the pharmacy.  - You may also stop by our office during regular business hours and pick up a GoodRx coupon card.  - If you need your prescription sent electronically to a different pharmacy, notify our office through [facility] or by phone at [phone] option 4.     Si Usted Necesita Algo Despus de Su Visita  Tambin puede enviarnos un mensaje a travs de Pharmacist, community. Por lo general respondemos a los mensajes de MyChart en el transcurso de 1 a 2 das hbiles.  Para renovar recetas, por favor pida a su farmacia que se ponga en contacto con nuestra oficina. Harland Dingwall de fax es [place] [phone].  Si tiene un asunto urgente cuando la clnica est cerrada y que no puede esperar hasta el siguiente da hbil, puede llamar/localizar a su doctor(a) al nmero que aparece a continuacin.   Por favor, tenga en cuenta que aunque hacemos todo lo posible para estar disponibles para asuntos urgentes fuera del horario de [place], no estamos disponibles las 24 horas del da, los 7 das de la [place].   Si tiene un problema urgente y no puede comunicarse con nosotros, puede optar por buscar atencin mdica  en el consultorio de su doctor(a), en una clnica privada, en un centro de atencin urgente o en una sala de emergencias.  Si tiene Engineering geologist, por favor llame inmediatamente al 911 o vaya a la sala de emergencias.  Nmeros de bper  - Dr. Nehemiah Massed: [phone]  - Dra. Moye: [phone]  - Dra. Nicole Kindred: [phone]  En caso de inclemencias del [place], por favor llame a Johnsie Kindred  principal al [phone] para una actualizacin sobre el [place] de cualquier retraso o cierre.  Consejos para la medicacin en dermatologa: Por favor, guarde las [employer]  vienen los medicamentos de uso tpico para ayudarle a seguir las instrucciones sobre dnde y cmo usarlos. Las farmacias generalmente imprimen las instrucciones del medicamento slo en las cajas y no directamente en los tubos del [place].   Si su medicamento es muy caro, por favor, pngase en contacto con Zigmund Daniel llamando al [phone] y presione la opcin 4 o envenos un mensaje a travs de Pharmacist, community.   No podemos decirle cul ser su copago por los medicamentos por adelantado ya que esto es diferente dependiendo de la cobertura de su seguro. Sin embargo, es posible que podamos encontrar un medicamento sustituto a Electrical engineer un formulario para que el seguro cubra el medicamento que se considera necesario.   Si se requiere una autorizacin previa para que su compaa de seguros [place] su medicamento, por favor permtanos de 1 a 2 das hbiles para completar este proceso.  Los precios de los medicamentos varan con frecuencia dependiendo del Environmental consultant de dnde se surte la receta y alguna farmacias pueden ofrecer precios ms baratos.  El sitio web www.goodrx.com tiene cupones para medicamentos de Airline pilot. Los precios aqu no tienen en cuenta lo que podra costar con la ayuda del seguro (puede ser ms barato con su seguro), pero el sitio web puede darle el precio si no utiliz Research scientist (physical sciences).  - Puede imprimir el cupn correspondiente y llevarlo con su receta a la farmacia.  - Tambin puede pasar por nuestra oficina durante el horario de atencin regular y Charity fundraiser una tarjeta de cupones de GoodRx.  - Si necesita que su receta se enve electrnicamente a una farmacia diferente, informe a nuestra oficina a travs de MyChart de [place] o por telfono llamando al [phone] y presione la opcin  4.

## 2022-04-06 NOTE — Progress Notes (Signed)
Follow-Up Visit   Subjective  Charles Choi is a 71 y.o. 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 Here to have some areas at back checked. Reports a few spots a chest he would like checked as well. ). The patient has spots, moles and lesions to be evaluated, some may be new or changing and the patient has concerns that these could be cancer.  The following portions of the chart were reviewed this encounter and updated as appropriate:  Tobacco  Allergies  Meds  Problems  Med Hx  Surg Hx  Fam Hx     Review of Systems: No other skin or systemic complaints except as noted in HPI or Assessment and Plan.  Objective  Well appearing patient in no apparent distress; mood and affect are within normal limits.  A focused examination was performed including back, chest. Relevant physical exam findings are noted in the Assessment and Plan.  low back spinal above sacral 1.2 cm pink papule       right posterior waistline 1.3 cm pink papule      left mid back x 2 (2) Erythematous stuck-on, waxy papule or plaque  Left Upper Back 1.5 cm Subcutaneous nodule.    Assessment & Plan   Neoplasm of uncertain behavior (2) low back spinal above sacral Epidermal / dermal shaving  Lesion diameter (cm):  1.2 Informed consent: discussed and consent obtained   Timeout: patient name, date of birth, surgical site, and procedure verified   Procedure prep:  Patient was prepped and draped in usual sterile fashion Prep type:  Isopropyl alcohol Anesthesia: the lesion was anesthetized in a standard fashion   Anesthetic:  1% lidocaine w/ epinephrine 1-100,000 buffered w/ 8.4% NaHCO3 Instrument used: flexible razor blade   Hemostasis achieved with: pressure, aluminum chloride and electrodesiccation   Outcome: patient tolerated procedure well   Post-procedure details: sterile dressing applied and wound care instructions given   Dressing type:  bandage and petrolatum    Destruction of lesion Complexity: extensive   Destruction method: electrodesiccation and curettage   Informed consent: discussed and consent obtained   Timeout:  patient name, date of birth, surgical site, and procedure verified Procedure prep:  Patient was prepped and draped in usual sterile fashion Prep type:  Isopropyl alcohol Anesthesia: the lesion was anesthetized in a standard fashion   Anesthetic:  1% lidocaine w/ epinephrine 1-100,000 buffered w/ 8.4% NaHCO3 Curettage performed in three different directions: Yes   Electrodesiccation performed over the curetted area: Yes   Lesion length (cm):  1.2 Lesion width (cm):  1.2 Margin per side (cm):  0.2 Final wound size (cm):  1.6 Hemostasis achieved with:  pressure, aluminum chloride and electrodesiccation Outcome: patient tolerated procedure well with no complications   Post-procedure details: sterile dressing applied and wound care instructions given   Dressing type: bandage and petrolatum    Specimen 1 - Surgical pathology Differential Diagnosis: R/o bcc  Check Margins: No  right posterior waistline Epidermal / dermal shaving  Lesion diameter (cm):  1.3 Informed consent: discussed and consent obtained   Timeout: patient name, date of birth, surgical site, and procedure verified   Procedure prep:  Patient was prepped and draped in usual sterile fashion Prep type:  Isopropyl alcohol Anesthesia: the lesion was anesthetized in a standard fashion   Anesthetic:  1% lidocaine w/ epinephrine 1-100,000 buffered w/ 8.4% NaHCO3 Instrument used: flexible razor blade   Hemostasis achieved with: pressure, aluminum  chloride and electrodesiccation   Outcome: patient tolerated procedure well   Post-procedure details: sterile dressing applied and wound care instructions given   Dressing type: bandage and petrolatum    Destruction of lesion Complexity: extensive   Destruction method: electrodesiccation and  curettage   Informed consent: discussed and consent obtained   Timeout:  patient name, date of birth, surgical site, and procedure verified Procedure prep:  Patient was prepped and draped in usual sterile fashion Prep type:  Isopropyl alcohol Anesthesia: the lesion was anesthetized in a standard fashion   Anesthetic:  1% lidocaine w/ epinephrine 1-100,000 buffered w/ 8.4% NaHCO3 Curettage performed in three different directions: Yes   Electrodesiccation performed over the curetted area: Yes   Lesion length (cm):  1.3 Lesion width (cm):  1.3 Margin per side (cm):  0.2 Final wound size (cm):  1.7 Hemostasis achieved with:  pressure, aluminum chloride and electrodesiccation Outcome: patient tolerated procedure well with no complications   Post-procedure details: sterile dressing applied and wound care instructions given   Dressing type: bandage and petrolatum    Specimen 2 - Surgical pathology Differential Diagnosis: R/o bcc  Check Margins: No R/o bcc   Inflamed seborrheic keratosis (2) left mid back x 2 Symptomatic, irritating, patient would like treated. Destruction of lesion - left mid back x 2 Complexity: simple   Destruction method: cryotherapy   Informed consent: discussed and consent obtained   Timeout:  patient name, date of birth, surgical site, and procedure verified Lesion destroyed using liquid nitrogen: Yes   Region frozen until ice ball extended beyond lesion: Yes   Outcome: patient tolerated procedure well with no complications   Post-procedure details: wound care instructions given   Additional details:  Prior to procedure, discussed risks of blister formation, small wound, skin dyspigmentation, or rare scar following cryotherapy. Recommend Vaseline ointment to treated areas while healing.  Epidermal inclusion cyst Left Upper Back Benign-appearing. Exam most consistent with an epidermal inclusion cyst. Discussed that a cyst is a benign growth that can grow over  time and sometimes get irritated or inflamed. Recommend observation if it is not bothersome. Discussed option of surgical excision to remove it if it is growing, symptomatic, or other changes noted. Please call for new or changing lesions so they can be evaluated.  Seborrheic Keratoses - Stuck-on, waxy, tan-brown papules and/or plaques  - Benign-appearing - Discussed benign etiology and prognosis. - Observe - Call for any changes  Actinic Damage - chronic, secondary to cumulative UV radiation exposure/sun exposure over time - diffuse scaly erythematous macules with underlying dyspigmentation - Recommend daily broad spectrum sunscreen SPF 30+ to sun-exposed areas, reapply every 2 hours as needed.  - Recommend staying in the shade or wearing long sleeves, sun glasses (UVA+UVB protection) and wide brim hats (4-inch brim around the entire circumference of the hat). - Call for new or changing lesions.  History of Squamous Cell Carcinoma of the Skin - No evidence of recurrence today - No lymphadenopathy - Recommend regular full body skin exams - Recommend daily broad spectrum sunscreen SPF 30+ to sun-exposed areas, reapply every 2 hours as needed.  - Call if any new or changing lesions are noted between office visits  History of Basal Cell Carcinoma of the Skin Hypertrophic scar at right chest  - No evidence of recurrence today - Recommend regular full body skin exams - Recommend daily broad spectrum sunscreen SPF 30+ to sun-exposed areas, reapply every 2 hours as needed.  - Call if any new or  changing lesions are noted between office visits   Return for jan - feb follow up . IRuthell Rummage, CMA, am acting as scribe for Sarina Ser, MD. Documentation: I have reviewed the above documentation for accuracy and completeness, and I agree with the above.  Sarina Ser, MD

## 2022-04-10 ENCOUNTER — Encounter: Payer: Self-pay | Admitting: Dermatology

## 2022-04-11 ENCOUNTER — Telehealth: Payer: Self-pay

## 2022-04-11 NOTE — Telephone Encounter (Signed)
Left message for patient to call office for results/hd 

## 2022-04-11 NOTE — Telephone Encounter (Signed)
-----   Message from Ralene Bathe, MD sent at 04/07/2022  6:03 PM EDT ----- Diagnosis 1. Skin , low back spinal above sacral SUPERFICIAL BASAL CELL CARCINOMA, CLOSE TO MARGIN 2. Skin , right posterior waistline SUPERFICIAL BASAL CELL CARCINOMA, CLOSE TO MARGIN  1&2 - both Cancer - BCC As suspected Both already treated Recheck next visit

## 2022-04-13 ENCOUNTER — Telehealth: Payer: Self-pay

## 2022-04-13 NOTE — Telephone Encounter (Signed)
Advised patient of results/hd  

## 2022-04-13 NOTE — Telephone Encounter (Signed)
-----   Message from Ralene Bathe, MD sent at 04/07/2022  6:03 PM EDT ----- Diagnosis 1. Skin , low back spinal above sacral SUPERFICIAL BASAL CELL CARCINOMA, CLOSE TO MARGIN 2. Skin , right posterior waistline SUPERFICIAL BASAL CELL CARCINOMA, CLOSE TO MARGIN  1&2 - both Cancer - BCC As suspected Both already treated Recheck next visit

## 2022-04-15 ENCOUNTER — Ambulatory Visit (INDEPENDENT_AMBULATORY_CARE_PROVIDER_SITE_OTHER): Payer: Medicare Other | Admitting: Urology

## 2022-04-15 ENCOUNTER — Encounter: Payer: Self-pay | Admitting: Urology

## 2022-04-15 VITALS — BP 130/78 | HR 74 | Ht 67.0 in | Wt 165.0 lb

## 2022-04-15 DIAGNOSIS — R300 Dysuria: Secondary | ICD-10-CM | POA: Diagnosis not present

## 2022-04-15 DIAGNOSIS — R350 Frequency of micturition: Secondary | ICD-10-CM | POA: Diagnosis not present

## 2022-04-15 DIAGNOSIS — R3915 Urgency of urination: Secondary | ICD-10-CM

## 2022-04-15 LAB — MICROSCOPIC EXAMINATION: Bacteria, UA: NONE SEEN

## 2022-04-15 LAB — URINALYSIS, COMPLETE
Bilirubin, UA: NEGATIVE
Glucose, UA: NEGATIVE
Ketones, UA: NEGATIVE
Leukocytes,UA: NEGATIVE
Nitrite, UA: NEGATIVE
Specific Gravity, UA: 1.03 (ref 1.005–1.030)
Urobilinogen, Ur: 0.2 mg/dL (ref 0.2–1.0)
pH, UA: 5.5 (ref 5.0–7.5)

## 2022-04-15 LAB — BLADDER SCAN AMB NON-IMAGING: Scan Result: 17

## 2022-04-15 MED ORDER — MIRABEGRON ER 25 MG PO TB24: 25.0000 mg | ORAL_TABLET | Freq: Every day | ORAL | 0 refills | Status: DC

## 2022-04-15 NOTE — Progress Notes (Signed)
04/15/2022 12:50 PM   Charles Choi [birth year] [id number]  Referring provider: Idelle Crouch, MD [place] [facility] [place],  [place] [address]  Chief Complaint  Patient presents with   Urinary Frequency    HPI: 71 y.o. male followed for hypogonadism. Called for an appointment for evaluation of bothersome lower urinary tract symptoms.  Long history of Parkinson's disease and 2 months ago began to have bothersome urinary frequency and urgency. Started on imipramine by Dr. Doy Hutching with mild improvement in his symptoms Denies dysuria, gross hematuria No flank, abdominal or pelvic pain   PMH: Past Medical History:  Diagnosis Date   Anxiety    Basal cell carcinoma 09/04/2017   left infrascapular mid back   Basal cell carcinoma 09/28/2015   left low back 11 cm lat to spinal scar   Basal cell carcinoma 09/28/2015   left posterior waistline 5.5 cm lateral to spinal scar   Basal cell carcinoma 07/06/2015   left medial low back   Basal cell carcinoma 07/06/2015   left low back lateral   Basal cell carcinoma 05/06/2015   right lower back   Basal cell carcinoma 09/16/2021   right medial mid chest, [facility] 12/01/21   Basal cell carcinoma 09/16/2021   left mid medial back, [facility] 12/01/21   Basal cell carcinoma 09/16/2021   right mid lower back   Basal cell carcinoma 04/06/2022   low back spinal above sacral - [facility]   Basal cell carcinoma 04/06/2022   Right post waistline - [facility]   Cancer ([place])    Depression    Hypertension    Parkinson's disease ([place])    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 If you have any questions, ask your nurse or doctor.          acetaminophen 325 MG tablet Commonly known as: TYLENOL Take 925 mg by mouth every 6 (six) hours as needed for mild pain or moderate pain.   amantadine 100 MG capsule Commonly known as: SYMMETREL Take 1 capsule (100 mg total) by mouth daily.   aspirin 325 MG tablet Take 325 mg by mouth 2 (two) times daily.   atorvastatin 20 MG tablet Commonly known as: LIPITOR Take 1 tablet (20 mg total) by mouth daily.   carbidopa-levodopa 25-100 MG tablet Commonly known as: SINEMET IR Take 1 tablet orally two times a day at 7am, and 10pm. Take 0.5 tablet orally at 10am, 1pm, 4pm, and 7pm.   ketoconazole 2 % cream Commonly known as: NIZORAL Apply 1 application topically daily. Apply to flaky areas daily as needed   losartan 100 MG tablet Commonly known as: COZAAR Take 1 tablet (100 mg total) by mouth daily.   Melatonin 3 MG Caps Take 1-2 tablets by mouth 2 hours before bedtime  meloxicam 7.5 MG tablet Commonly known as: MOBIC Take 1 tablet (7.5 mg total) by mouth daily.   metoprolol succinate 25 MG 24 hr tablet Commonly known as: TOPROL-XL Take 1 tablet (25 mg total) by mouth 2 (two) times daily.   metoprolol tartrate 25 MG tablet Commonly known as: LOPRESSOR TAKE 1 TABLET TWICE DAILY   NON FORMULARY Diet Type: Regular   omeprazole 20 MG tablet Commonly known as: PRILOSEC OTC Take 40 mg by mouth daily.   omeprazole 40 MG capsule Commonly known as: PRILOSEC Take 1 capsule by mouth daily.   penicillin v potassium 500 MG tablet Commonly known as: VEETID Take 500 mg by mouth 4 (four) times daily.   Potassium Citrate 15 MEQ (1620 MG) Tbcr Take 1 tablet by mouth 2 (two) times daily.   ranolazine 500 MG 12 hr tablet Commonly known as: RANEXA   rOPINIRole 2 MG  tablet Commonly known as: REQUIP Take 1 tablet (2 mg total) by mouth every 3 (three) hours. At 7am, and 1pm. What changed: how much to take   selegiline 5 MG capsule Commonly known as: ELDEPRYL Take 1 capsule (5 mg total) by mouth 2 (two) times daily. At 7am and 1pm.   tadalafil 20 MG tablet Commonly known as: CIALIS Take 1 tablet (20 mg total) by mouth daily.   testosterone cypionate 200 MG/ML injection Commonly known as: DEPOTESTOSTERONE CYPIONATE Inject 1 mL (200 mg total) into the muscle every 14 (fourteen) days.   traZODone 150 MG tablet Commonly known as: DESYREL Take 0.5 tablets (75 mg total) by mouth at bedtime as needed.        Allergies:  Allergies  Allergen Reactions   Phenergan [Promethazine Hcl] Other (See Comments)    Unknown    Reglan [Metoclopramide] Other (See Comments)    Pass out     Family History: No family history on file.  Social History:  reports that he has never smoked. He has never used smokeless tobacco. He reports current alcohol use. He reports current drug use. Drug: Marijuana.   Physical Exam: BP 130/78   Pulse 74   Ht '5\' 7"'$  (1.702 m)   Wt 165 lb (74.8 kg)   BMI 25.84 kg/m   Constitutional:  Alert and oriented, No acute distress. HEENT: [place] AT Respiratory: Normal respiratory effort, no increased work of breathing. Psychiatric: Normal mood and affect.  Laboratory Data:  Urinalysis Dipstick/microscopy negative    Assessment & Plan:    1.  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  Call back regarding efficacy in 1 month.  If he does have significant efficacy on the Myrbetriq would recommend he discontinue the imipramine   Abbie Sons, MD  [facility] [address], [place], [place] [address] [phone]

## 2022-04-17 ENCOUNTER — Encounter: Payer: Self-pay | Admitting: Urology

## 2022-05-03 ENCOUNTER — Other Ambulatory Visit: Payer: Medicare Other

## 2022-05-03 DIAGNOSIS — E291 Testicular hypofunction: Secondary | ICD-10-CM

## 2022-05-03 DIAGNOSIS — Z87442 Personal history of urinary calculi: Secondary | ICD-10-CM

## 2022-05-04 ENCOUNTER — Encounter: Payer: Self-pay | Admitting: *Deleted

## 2022-05-04 LAB — HEMATOCRIT: Hematocrit: 44.9 % (ref 37.5–51.0)

## 2022-05-04 LAB — TESTOSTERONE: Testosterone: 408 ng/dL (ref 264–916)

## 2022-05-04 LAB — PSA: Prostate Specific Ag, Serum: 3.8 ng/mL (ref 0.0–4.0)

## 2022-05-11 ENCOUNTER — Other Ambulatory Visit: Payer: Self-pay | Admitting: Urology

## 2022-05-11 MED ORDER — MIRABEGRON ER 25 MG PO TB24: 25.0000 mg | ORAL_TABLET | Freq: Every day | ORAL | 12 refills | Status: DC

## 2022-07-21 ENCOUNTER — Other Ambulatory Visit: Payer: Medicare Other

## 2022-07-21 ENCOUNTER — Other Ambulatory Visit: Payer: Self-pay

## 2022-07-21 ENCOUNTER — Encounter: Payer: Self-pay | Admitting: Urology

## 2022-07-21 DIAGNOSIS — R972 Elevated prostate specific antigen [PSA]: Secondary | ICD-10-CM

## 2022-07-21 DIAGNOSIS — E291 Testicular hypofunction: Secondary | ICD-10-CM

## 2022-07-25 ENCOUNTER — Encounter: Payer: Self-pay | Admitting: *Deleted

## 2022-07-25 ENCOUNTER — Ambulatory Visit: Payer: Medicare Other | Admitting: Urology

## 2022-07-25 ENCOUNTER — Encounter: Payer: Self-pay | Admitting: Urology

## 2022-09-16 ENCOUNTER — Encounter: Payer: Self-pay | Admitting: Dermatology

## 2022-09-19 ENCOUNTER — Ambulatory Visit (INDEPENDENT_AMBULATORY_CARE_PROVIDER_SITE_OTHER): Payer: Medicare Other | Admitting: Dermatology

## 2022-09-19 DIAGNOSIS — L723 Sebaceous cyst: Secondary | ICD-10-CM | POA: Diagnosis not present

## 2022-09-19 DIAGNOSIS — L03312 Cellulitis of back [any part except buttock]: Secondary | ICD-10-CM

## 2022-09-19 MED ORDER — DOXYCYCLINE MONOHYDRATE 100 MG PO CAPS: ORAL_CAPSULE | ORAL | 0 refills | Status: DC

## 2022-09-19 NOTE — Patient Instructions (Signed)
 Pre-Operative Instructions  You are scheduled for a surgical procedure at [facility]. We recommend you read the following instructions. If you have any questions or concerns, please call the office at [phone].  Shower and wash the entire body with soap and water the day of your surgery paying special attention to cleansing at and around the planned surgery site.  Avoid aspirin or aspirin containing products at least fourteen (14) days prior to your surgical procedure and for at least one week (7 Days) after your surgical procedure. If you take aspirin on a regular basis for heart disease or history of stroke or for any other reason, we may recommend you continue taking aspirin but please notify us if you take this on a regular basis. Aspirin can cause more bleeding to occur during surgery as well as prolonged bleeding and bruising after surgery.   Avoid other nonsteroidal pain medications at least one week prior to surgery and at least one week prior to your surgery. These include medications such as Ibuprofen (Motrin, Advil and Nuprin), Naprosyn, Voltaren, Relafen, etc. If medications are used for therapeutic reasons, please inform us as they can cause increased bleeding or prolonged bleeding during and bruising after surgical procedures.   Please advise us if you are taking any "blood thinner" medications such as Coumadin or Dipyridamole or Plavix or similar medications. These cause increased bleeding and prolonged bleeding during procedures and bruising after surgical procedures. We may have to consider discontinuing these medications briefly prior to and shortly after your surgery if safe to do so.   Please inform us of all medications you are currently taking. All medications that are taken regularly should be taken the day of surgery as you always do. Nevertheless, we need to be informed of what medications you are taking prior to surgery to know whether they will affect the  procedure or cause any complications.   Please inform us of any medication allergies. Also inform us of whether you have allergies to Latex or rubber products or whether you have had any adverse reaction to Lidocaine or Epinephrine.  Please inform us of any prosthetic or artificial body parts such as artificial heart valve, joint replacements, etc., or similar condition that might require preoperative antibiotics.   We recommend avoidance of alcohol at least two weeks prior to surgery and continued avoidance for at least two weeks after surgery.   We recommend discontinuation of tobacco smoking at least two weeks prior to surgery and continued abstinence for at least two weeks after surgery.  Do not plan strenuous exercise, strenuous work or strenuous lifting for approximately four weeks after your surgery.   We request if you are unable to make your scheduled surgical appointment, please call us at least a week in advance or as soon as you are aware of a problem so that we can cancel or reschedule the appointment.   You MAY TAKE TYLENOL (acetaminophen) for pain as it is not a blood thinner.   PLEASE PLAN TO BE IN TOWN FOR TWO WEEKS FOLLOWING SURGERY, THIS IS IMPORTANT SO YOU CAN BE CHECKED FOR DRESSING CHANGES, SUTURE REMOVAL AND TO MONITOR FOR POSSIBLE COMPLICATIONS.    Due to recent changes in healthcare laws, you may see results of your pathology and/or laboratory studies on MyChart before the doctors have had a chance to review them. We understand that in some cases there may be results that are confusing or concerning to you. Please understand that not all results are   received at the same time and often the doctors may need to interpret multiple results in order to provide you with the best plan of care or course of treatment. Therefore, we ask that you please give us 2 business days to thoroughly review all your results before contacting the office for clarification. Should we see a  critical lab result, you will be contacted sooner.   If You Need Anything After Your Visit  If you have any questions or concerns for your doctor, please call our main line at [phone] and press option 4 to reach your doctor's medical assistant. If no one answers, please leave a voicemail as directed and we will return your call as soon as possible. Messages left after 4 pm will be answered the following business day.   You may also send us a message via MyChart. We typically respond to MyChart messages within 1-2 business days.  For prescription refills, please ask your pharmacy to contact our office. Our fax number is [phone].  If you have an urgent issue when the clinic is closed that cannot wait until the next business day, you can page your doctor at the number below.    Please note that while we do our best to be available for urgent issues outside of office hours, we are not available 24/7.   If you have an urgent issue and are unable to reach us, you may choose to seek medical care at your doctor's office, retail clinic, urgent care center, or emergency room.  If you have a medical emergency, please immediately call 911 or go to the emergency department.  Pager Numbers  - Dr. Kowalski: [phone]  - Dr. Moye: [phone]  - Dr. Stewart: [phone]  In the event of inclement weather, please call our main line at [phone] for an update on the status of any delays or closures.  Dermatology Medication Tips: Please keep the boxes that topical medications come in in order to help keep track of the instructions about where and how to use these. Pharmacies typically print the medication instructions only on the boxes and not directly on the medication tubes.   If your medication is too expensive, please contact our office at [phone] option 4 or send us a message through MyChart.   We are unable to tell what your co-pay for medications will be in advance as  this is different depending on your insurance coverage. However, we may be able to find a substitute medication at lower cost or fill out paperwork to get insurance to cover a needed medication.   If a prior authorization is required to get your medication covered by your insurance company, please allow us 1-2 business days to complete this process.  Drug prices often vary depending on where the prescription is filled and some pharmacies may offer cheaper prices.  The website www.goodrx.com contains coupons for medications through different pharmacies. The prices here do not account for what the cost may be with help from insurance (it may be cheaper with your insurance), but the website can give you the price if you did not use any insurance.  - You can print the associated coupon and take it with your prescription to the pharmacy.  - You may also stop by our office during regular business hours and pick up a GoodRx coupon card.  - If you need your prescription sent electronically to a different pharmacy, notify our office through [place] MyChart or by phone at [phone] option 4.       Si Usted Necesita Algo Despus de Su Visita  Tambin puede enviarnos un mensaje a travs de MyChart. Por lo general respondemos a los mensajes de MyChart en el transcurso de 1 a 2 das hbiles.  Para renovar recetas, por favor pida a su farmacia que se ponga en contacto con nuestra oficina. Nuestro nmero de fax es el [phone].  Si tiene un asunto urgente cuando la clnica est cerrada y que no puede esperar hasta el siguiente da hbil, puede llamar/localizar a su doctor(a) al nmero que aparece a continuacin.   Por favor, tenga en cuenta que aunque hacemos todo lo posible para estar disponibles para asuntos urgentes fuera del horario de oficina, no estamos disponibles las 24 horas del da, los 7 das de la semana.   Si tiene un problema urgente y no puede comunicarse con nosotros, puede optar por  buscar atencin mdica  en el consultorio de su doctor(a), en una clnica privada, en un centro de atencin urgente o en una sala de emergencias.  Si tiene una emergencia mdica, por favor llame inmediatamente al 911 o vaya a la sala de emergencias.  Nmeros de bper  - Dr. Kowalski: [phone]  - Dra. Moye: [phone]  - Dra. Stewart: [phone]  En caso de inclemencias del tiempo, por favor llame a nuestra lnea principal al [phone] para una actualizacin sobre el estado de cualquier retraso o cierre.  Consejos para la medicacin en dermatologa: Por favor, guarde las cajas en las que vienen los medicamentos de uso tpico para ayudarle a seguir las instrucciones sobre dnde y cmo usarlos. Las farmacias generalmente imprimen las instrucciones del medicamento slo en las cajas y no directamente en los tubos del medicamento.   Si su medicamento es muy caro, por favor, pngase en contacto con nuestra oficina llamando al [phone] y presione la opcin 4 o envenos un mensaje a travs de MyChart.   No podemos decirle cul ser su copago por los medicamentos por adelantado ya que esto es diferente dependiendo de la cobertura de su seguro. Sin embargo, es posible que podamos encontrar un medicamento sustituto a menor costo o llenar un formulario para que el seguro cubra el medicamento que se considera necesario.   Si se requiere una autorizacin previa para que su compaa de seguros cubra su medicamento, por favor permtanos de 1 a 2 das hbiles para completar este proceso.  Los precios de los medicamentos varan con frecuencia dependiendo del lugar de dnde se surte la receta y alguna farmacias pueden ofrecer precios ms baratos.  El sitio web www.goodrx.com tiene cupones para medicamentos de diferentes farmacias. Los precios aqu no tienen en cuenta lo que podra costar con la ayuda del seguro (puede ser ms barato con su seguro), pero el sitio web puede darle el precio si no  utiliz ningn seguro.  - Puede imprimir el cupn correspondiente y llevarlo con su receta a la farmacia.  - Tambin puede pasar por nuestra oficina durante el horario de atencin regular y recoger una tarjeta de cupones de GoodRx.  - Si necesita que su receta se enve electrnicamente a una farmacia diferente, informe a nuestra oficina a travs de MyChart de [place] o por telfono llamando al [phone] y presione la opcin 4.  

## 2022-09-19 NOTE — Progress Notes (Signed)
   Follow-Up Visit   Subjective  Charles Choi is a 71 y.o. male who presents for the following: Inflamed cyst (On the L mid back - tender and painful, no hx of rupture and drainage).  The following portions of the chart were reviewed this encounter and updated as appropriate:   Tobacco  Allergies  Meds  Problems  Med Hx  Surg Hx  Fam Hx     Review of Systems:  No other skin or systemic complaints except as noted in HPI or Assessment and Plan.  Objective  Well appearing patient in no apparent distress; mood and affect are within normal limits.  A focused examination was performed including the face, trunk, extremities. Relevant physical exam findings are noted in the Assessment and Plan.   Assessment & Plan  Inflamed cyst Not abscessed L mid back  Cyst with symptoms and/or recent change.  Discussed surgical excision to remove, including resulting scar and possible recurrence.  Patient will schedule for surgery. Pre-op information given.  Start Doxycycline BID x 10 days then QD x 20 days thereafter. Doxycycline should be taken with food to prevent nausea. Do not lay down for 30 minutes after taking. Be cautious with sun exposure and use good sun protection while on this medication. Pregnant women should not take this medication.   doxycycline (MONODOX) 100 MG capsule - L mid back Take one cap po BID x 10 days. Then one tab po QD x 20 days. Take with food.  Return in about 3 months (around 12/20/2022) for cyst excision .  Luther Redo, CMA, am acting as scribe for Sarina Ser, MD . Documentation: I have reviewed the above documentation for accuracy and completeness, and I agree with the above.  Sarina Ser, MD

## 2022-09-24 ENCOUNTER — Encounter: Payer: Self-pay | Admitting: Dermatology

## 2022-09-26 ENCOUNTER — Ambulatory Visit (INDEPENDENT_AMBULATORY_CARE_PROVIDER_SITE_OTHER): Payer: Medicare Other | Admitting: Dermatology

## 2022-09-26 DIAGNOSIS — L02212 Cutaneous abscess of back [any part, except buttock]: Secondary | ICD-10-CM

## 2022-09-26 DIAGNOSIS — L720 Epidermal cyst: Secondary | ICD-10-CM | POA: Diagnosis not present

## 2022-09-26 DIAGNOSIS — L03312 Cellulitis of back [any part except buttock]: Secondary | ICD-10-CM

## 2022-09-26 DIAGNOSIS — L0291 Cutaneous abscess, unspecified: Secondary | ICD-10-CM

## 2022-09-26 NOTE — Patient Instructions (Addendum)
Keep taking doxycycline twice a day for another week, then can decrease to taking 1 tab by mouth daily until complete.   Doxycycline should be taken with food to prevent nausea. Do not lay down for 30 minutes after taking. Be cautious with sun exposure and use good sun protection while on this medication. Pregnant women should not take this medication.     Incision and Drainage Incision and drainage is a surgical procedure to open and drain a fluid-filled sac. The sac may be filled with pus, mucus, or blood. Examples of fluid-filled sacs that may need surgical drainage include cysts, skin infections (abscesses), and red lumps that develop from a ruptured cyst or a small abscess (boils). You may need this procedure if the affected area is large, painful, infected, or not healing well. Tell a health care provider about: Any allergies you have. All medicines you are taking, including vitamins, herbs, eye drops, creams, and over-the-counter medicines. Any problems you or family members have had with anesthetic medicines. Any blood disorders you have or have had. Any surgeries you have had. Any medical conditions you have or have had. Whether you are pregnant or may be pregnant. What are the risks? Generally, this is a safe procedure. However, problems may occur, including: Infection. Bleeding. Allergic reactions to medicines. Scarring. The cyst or abscess returns. Damage to nerves or vessels. What happens before the procedure? Medicine Ask your health care provider about: Changing or stopping your regular medicines. This is especially important if you are taking diabetes medicines or blood thinners. Taking medicines such as aspirin and ibuprofen. These medicines can thin your blood. Do not take these medicines unless your health care provider tells you to take them. Taking over-the-counter medicines, vitamins, herbs, and supplements. Tests You may have an exam or testing. These may  include: Ultrasound or other imaging tests to see how large or deep the fluid-filled sac is. Blood tests to check for infection. General instructions Follow instructions from your health care provider about eating or drinking restrictions. Plan to have someone take you home from the hospital or clinic. Ask your health care provider whether a responsible adult should care for you for at least 24 hours after you leave the hospital or clinic. This is important. You may get a tetanus shot. Ask your health care provider: How your surgery site will be marked or identified. What steps will be taken to help prevent infection. These may include: Removing hair at the surgery site. Washing skin with a germ-killing soap. Receiving antibiotic medicine. What happens during the procedure?  An IV may be inserted into one of your veins. You will be given one or more of the following: A medicine to help you relax (sedative). A medicine to numb the area (local anesthetic). A medicine to make you fall asleep (general anesthetic). An incision will be made in the top of the fluid-filled sac. Pus, blood, and mucus will be squeezed out, and a syringe or tube (drain) may be used to empty more fluid from the sac. Your health care provider will do one of the following. He or she may: Leave the drain in place for several weeks to drain more fluid. Stitch open the edges of the incision to make a long-term opening for drainage (marsupialization). The inside of the sac may be washed out (irrigated) with a sterile solution and packed with gauze before it is covered with a bandage (dressing). Your health care provider may do a culture test of the drainage fluid.  The procedure may vary among health care providers and hospitals. What happens after the procedure? Your blood pressure, heart rate, breathing rate, and blood oxygen level will be monitored often until you leave the hospital or clinic. Do not drive for 24  hours if you were given a sedative during your procedure. Summary Incision and drainage is a surgical procedure to open and drain a fluid-filled sac. The sac may be filled with pus, mucus, or blood. Before the procedure, you may be given antibiotic medicine to treat or help prevent infection. During the procedure, an incision will be made in the top of the fluid-filled sac. Pus, blood, and mucus is squeezed out, and a syringe or tube (drain) may be used to empty more fluid from the sac. The inside of the sac may be washed out (irrigated) with a sterile solution and packed with gauze before it is covered with a bandage (dressing). This information is not intended to replace advice given to you by your health care provider. Make sure you discuss any questions you have with your health care provider. Document Revised: 01/27/2022 Document Reviewed: 08/05/2021 Elsevier Patient Education  [place].    Due to recent changes in healthcare laws, you may see results of your pathology and/or laboratory studies on MyChart before the doctors have had a chance to review them. We understand that in some cases there may be results that are confusing or concerning to you. Please understand that not all results are received at the same time and often the doctors may need to interpret multiple results in order to provide you with the best plan of care or course of treatment. Therefore, we ask that you please give [place] 2 business days to thoroughly review all your results before contacting the office for clarification. Should we see a critical lab result, you will be contacted sooner.   If You Need Anything After Your Visit  If you have any questions or concerns for your doctor, please call our main line at [phone] and press option 4 to reach your doctor's medical assistant. If no one answers, please leave a voicemail as directed and we will return your call as soon as possible. Messages left after 4 pm  will be answered the following business day.   You may also send [place] a message via [place]. We typically respond to MyChart messages within 1-2 business days.  For prescription refills, please ask your pharmacy to contact our office. Our fax number is [phone].  If you have an urgent issue when the clinic is closed that cannot wait until the next business day, you can page your doctor at the number below.    Please note that while we do our best to be available for urgent issues outside of office hours, we are not available 24/7.   If you have an urgent issue and are unable to reach [place], you may choose to seek medical care at your doctor's office, retail clinic, urgent care center, or emergency room.  If you have a medical emergency, please immediately call 911 or go to the emergency department.  Pager Numbers  - Dr. Nehemiah Massed: [phone]  - Dr. Laurence Ferrari: [phone]  - Dr. Nicole Kindred: [phone]  In the event of inclement weather, please call our main line at [phone] for an update on the status of any delays or closures.  Dermatology Medication Tips: Please keep the boxes that topical medications come in in order to help keep track of the instructions about where  and how to use these. Pharmacies typically print the medication instructions only on the boxes and not directly on the medication tubes.   If your medication is too expensive, please contact our office at [phone] option 4 or send [place] a message through [place].   We are unable to tell what your co-pay for medications will be in advance as this is different depending on your insurance coverage. However, we may be able to find a substitute medication at lower cost or fill out paperwork to get insurance to cover a needed medication.   If a prior authorization is required to get your medication covered by your insurance company, please allow [place] 1-2 business days to complete this process.  Drug prices often vary depending  on where the prescription is filled and some pharmacies may offer cheaper prices.  The website www.goodrx.com contains coupons for medications through different pharmacies. The prices here do not account for what the cost may be with help from insurance (it may be cheaper with your insurance), but the website can give you the price if you did not use any insurance.  - You can print the associated coupon and take it with your prescription to the pharmacy.  - You may also stop by our office during regular business hours and pick up a GoodRx coupon card.  - If you need your prescription sent electronically to a different pharmacy, notify our office through [facility] or by phone at [phone] option 4.     Si Usted Necesita Algo Despus de Su Visita  Tambin puede enviarnos un mensaje a travs de Pharmacist, community. Por lo general respondemos a los mensajes de MyChart en el transcurso de 1 a 2 das hbiles.  Para renovar recetas, por favor pida a su farmacia que se ponga en contacto con nuestra oficina. Harland Dingwall de fax es [place] [phone].  Si tiene un asunto urgente cuando la clnica est cerrada y que no puede esperar hasta el siguiente da hbil, puede llamar/localizar a su doctor(a) al nmero que aparece a continuacin.   Por favor, tenga en cuenta que aunque hacemos todo lo posible para estar disponibles para asuntos urgentes fuera del horario de [place], no estamos disponibles las 24 horas del da, los 7 das de la [place].   Si tiene un problema urgente y no puede comunicarse con nosotros, puede optar por buscar atencin mdica  en el consultorio de su doctor(a), en una clnica privada, en un centro de atencin urgente o en una sala de emergencias.  Si tiene Engineering geologist, por favor llame inmediatamente al 911 o vaya a la sala de emergencias.  Nmeros de bper  - Dr. Nehemiah Massed: [phone]  - Dra. Moye: [phone]  - Dra. Nicole Kindred: [phone]  En caso de  inclemencias del [place], por favor llame a Johnsie Kindred principal al [phone] para una actualizacin sobre el [place] de cualquier retraso o cierre.  Consejos para la medicacin en dermatologa: Por favor, guarde las cajas en las que vienen los medicamentos de uso tpico para ayudarle a seguir las instrucciones sobre dnde y cmo usarlos. Las farmacias generalmente imprimen las instrucciones del medicamento slo en las cajas y no directamente en los tubos del [place].   Si su medicamento es muy caro, por favor, pngase en contacto con Zigmund Daniel llamando al [phone] y presione la opcin 4 o envenos un mensaje a travs de Pharmacist, community.   No podemos decirle cul ser su copago por los medicamentos por adelantado ya que esto es  diferente dependiendo de la cobertura de su seguro. Sin embargo, es posible que podamos encontrar un medicamento sustituto a Electrical engineer un formulario para que el seguro cubra el medicamento que se considera necesario.   Si se requiere una autorizacin previa para que su compaa de seguros [place] su medicamento, por favor permtanos de 1 a 2 das hbiles para completar este proceso.  Los precios de los medicamentos varan con frecuencia dependiendo del Environmental consultant de dnde se surte la receta y alguna farmacias pueden ofrecer precios ms baratos.  El sitio web www.goodrx.com tiene cupones para medicamentos de Airline pilot. Los precios aqu no tienen en cuenta lo que podra costar con la ayuda del seguro (puede ser ms barato con su seguro), pero el sitio web puede darle el precio si no utiliz Research scientist (physical sciences).  - Puede imprimir el cupn correspondiente y llevarlo con su receta a la farmacia.  - Tambin puede pasar por nuestra oficina durante el horario de atencin regular y Charity fundraiser una tarjeta de cupones de GoodRx.  - Si necesita que su receta se enve electrnicamente a una farmacia diferente, informe a nuestra oficina a travs de MyChart de [place] o  por telfono llamando al [phone] y presione la opcin 4.

## 2022-09-26 NOTE — Progress Notes (Signed)
   Follow-Up Visit   Subjective  Charles Choi is a 71 y.o. male who presents for the following: Skin Problem (Patient here concerning a cyst at left upper back that reports is inflamed and bothersome the past few weeks. ).  The following portions of the chart were reviewed this encounter and updated as appropriate:  Tobacco  Allergies  Meds  Problems  Med Hx  Surg Hx  Fam Hx     Review of Systems: No other skin or systemic complaints except as noted in HPI or Assessment and Plan.  Objective  Well appearing patient in no apparent distress; mood and affect are within normal limits.  A focused examination was performed including left upper back. Relevant physical exam findings are noted in the Assessment and Plan.  Left Upper Back 5.1 cm rubbery tender nodule    Assessment & Plan  Epidermal inclusion cyst With rupture; inflammation; cellulitis and abscess formation Left Upper Back  Recommend continue doxycycline 100 mg by mouth twice daily for another week, then decrease to 1 tab by mouth daily until end.  Doxycycline should be taken with food to prevent nausea. Do not lay down for 30 minutes after taking. Be cautious with sun exposure and use good sun protection while on this medication. Pregnant women should not take this medication.   Incision and Drainage - Left Upper Back -complicated Location: left upper back  Informed Consent: Discussed risks (permanent scarring, light or dark discoloration, infection, pain, bleeding, bruising, redness, damage to adjacent structures, and recurrence of the lesion) and benefits of the procedure, as well as the alternatives.  Informed consent was obtained.  Preparation: The area was prepped with alcohol.  Anesthesia: Lidocaine 1% without epinephrine  Procedure Details: An incision was made overlying the lesion with 4 mm punch followed by currettage & irrigation.  The lesion drained pus, white, chalky cyst material, and blood.  A  large amount of fluid was drained.    Antibiotic ointment and a sterile pressure dressing were applied. The patient tolerated procedure well.  Total number of lesions drained: 1  Plan: The patient was instructed on post-op care. Recommend OTC analgesia as needed for pain.  Return for keep follow up as scheduled in february.  IRuthell Rummage, CMA, am acting as scribe for Sarina Ser, MD. Documentation: I have reviewed the above documentation for accuracy and completeness, and I agree with the above.  Sarina Ser, MD

## 2022-09-30 ENCOUNTER — Other Ambulatory Visit: Payer: Self-pay

## 2022-09-30 DIAGNOSIS — M19032 Primary osteoarthritis, left wrist: Secondary | ICD-10-CM | POA: Diagnosis present

## 2022-09-30 DIAGNOSIS — Z96652 Presence of left artificial knee joint: Secondary | ICD-10-CM | POA: Diagnosis present

## 2022-09-30 DIAGNOSIS — Z791 Long term (current) use of non-steroidal anti-inflammatories (NSAID): Secondary | ICD-10-CM

## 2022-09-30 DIAGNOSIS — Z85828 Personal history of other malignant neoplasm of skin: Secondary | ICD-10-CM

## 2022-09-30 DIAGNOSIS — Z9049 Acquired absence of other specified parts of digestive tract: Secondary | ICD-10-CM

## 2022-09-30 DIAGNOSIS — Z8547 Personal history of malignant neoplasm of testis: Secondary | ICD-10-CM

## 2022-09-30 DIAGNOSIS — N1831 Chronic kidney disease, stage 3a: Secondary | ICD-10-CM | POA: Diagnosis present

## 2022-09-30 DIAGNOSIS — K219 Gastro-esophageal reflux disease without esophagitis: Secondary | ICD-10-CM | POA: Diagnosis present

## 2022-09-30 DIAGNOSIS — M19012 Primary osteoarthritis, left shoulder: Secondary | ICD-10-CM | POA: Diagnosis present

## 2022-09-30 DIAGNOSIS — K353 Acute appendicitis with localized peritonitis, without perforation or gangrene: Principal | ICD-10-CM | POA: Diagnosis present

## 2022-09-30 DIAGNOSIS — G20A1 Parkinson's disease without dyskinesia, without mention of fluctuations: Secondary | ICD-10-CM | POA: Diagnosis present

## 2022-09-30 DIAGNOSIS — Z7982 Long term (current) use of aspirin: Secondary | ICD-10-CM

## 2022-09-30 DIAGNOSIS — I2510 Atherosclerotic heart disease of native coronary artery without angina pectoris: Secondary | ICD-10-CM | POA: Diagnosis present

## 2022-09-30 DIAGNOSIS — F32A Depression, unspecified: Secondary | ICD-10-CM | POA: Diagnosis present

## 2022-09-30 DIAGNOSIS — Z888 Allergy status to other drugs, medicaments and biological substances status: Secondary | ICD-10-CM

## 2022-09-30 DIAGNOSIS — K3580 Unspecified acute appendicitis: Secondary | ICD-10-CM | POA: Diagnosis not present

## 2022-09-30 DIAGNOSIS — Z9079 Acquired absence of other genital organ(s): Secondary | ICD-10-CM

## 2022-09-30 DIAGNOSIS — E785 Hyperlipidemia, unspecified: Secondary | ICD-10-CM | POA: Diagnosis present

## 2022-09-30 DIAGNOSIS — I129 Hypertensive chronic kidney disease with stage 1 through stage 4 chronic kidney disease, or unspecified chronic kidney disease: Secondary | ICD-10-CM | POA: Diagnosis present

## 2022-09-30 DIAGNOSIS — M419 Scoliosis, unspecified: Secondary | ICD-10-CM | POA: Diagnosis present

## 2022-09-30 DIAGNOSIS — Z9682 Presence of neurostimulator: Secondary | ICD-10-CM

## 2022-09-30 DIAGNOSIS — F411 Generalized anxiety disorder: Secondary | ICD-10-CM | POA: Diagnosis present

## 2022-09-30 DIAGNOSIS — Z79899 Other long term (current) drug therapy: Secondary | ICD-10-CM

## 2022-09-30 LAB — CBC
HCT: 49.2 % (ref 39.0–52.0)
Hemoglobin: 15 g/dL (ref 13.0–17.0)
MCH: 24.7 pg — ABNORMAL LOW (ref 26.0–34.0)
MCHC: 30.5 g/dL (ref 30.0–36.0)
MCV: 80.9 fL (ref 80.0–100.0)
Platelets: 251 10*3/uL (ref 150–400)
RBC: 6.08 MIL/uL — ABNORMAL HIGH (ref 4.22–5.81)
RDW: 20.7 % — ABNORMAL HIGH (ref 11.5–15.5)
WBC: 16.8 10*3/uL — ABNORMAL HIGH (ref 4.0–10.5)
nRBC: 0 % (ref 0.0–0.2)

## 2022-09-30 LAB — COMPREHENSIVE METABOLIC PANEL
ALT: 15 U/L (ref 0–44)
AST: 36 U/L (ref 15–41)
Albumin: 4.2 g/dL (ref 3.5–5.0)
Alkaline Phosphatase: 90 U/L (ref 38–126)
Anion gap: 14 (ref 5–15)
BUN: 23 mg/dL (ref 8–23)
CO2: 22 mmol/L (ref 22–32)
Calcium: 9.6 mg/dL (ref 8.9–10.3)
Chloride: 105 mmol/L (ref 98–111)
Creatinine, Ser: 1.37 mg/dL — ABNORMAL HIGH (ref 0.61–1.24)
GFR, Estimated: 55 mL/min — ABNORMAL LOW (ref >60)
Glucose, Bld: 96 mg/dL (ref 70–99)
Potassium: 4.2 mmol/L (ref 3.5–5.1)
Sodium: 141 mmol/L (ref 135–145)
Total Bilirubin: 1.8 mg/dL — ABNORMAL HIGH (ref 0.3–1.2)
Total Protein: 7.5 g/dL (ref 6.5–8.1)

## 2022-09-30 LAB — URINALYSIS, ROUTINE W REFLEX MICROSCOPIC
Bacteria, UA: NONE SEEN
Bilirubin Urine: NEGATIVE
Glucose, UA: 50 mg/dL — AB
Ketones, ur: 5 mg/dL — AB
Leukocytes,Ua: NEGATIVE
Nitrite: NEGATIVE
Protein, ur: 100 mg/dL — AB
Specific Gravity, Urine: 1.025 (ref 1.005–1.030)
Squamous Epithelial / HPF: NONE SEEN (ref 0–5)
pH: 5 (ref 5.0–8.0)

## 2022-09-30 LAB — LIPASE, BLOOD: Lipase: 32 U/L (ref 11–51)

## 2022-09-30 NOTE — ED Triage Notes (Signed)
First Nurse Note: Pt reports RLQ pain that started yesterday. Non radiating. Pt also reports chills.   EMS VS: BP 162/95 90% RA on 2L [place] HR 90

## 2022-09-30 NOTE — ED Triage Notes (Signed)
Pt comes from home via [facility] c/o sharp right lower abd pain. Pt states pain started yesterday but has gotten worse. Pt last BM was last night. Denies CP, SOB.

## 2022-10-01 ENCOUNTER — Encounter: Payer: Self-pay | Admitting: Anesthesiology

## 2022-10-01 ENCOUNTER — Encounter
Admission: EM | Disposition: A | Discharge status: Home / Self Care | Payer: Self-pay | Source: Home / Self Care | Attending: Surgery

## 2022-10-01 ENCOUNTER — Emergency Department: Payer: Medicare Other | Admitting: Anesthesiology

## 2022-10-01 ENCOUNTER — Inpatient Hospital Stay
Admission: EM | Admit: 2022-10-01 | Discharge: 2022-10-02 | DRG: 399 | Disposition: A | Discharge status: Home / Self Care | Payer: Medicare Other | Attending: Surgery | Admitting: Surgery

## 2022-10-01 ENCOUNTER — Other Ambulatory Visit: Payer: Self-pay

## 2022-10-01 ENCOUNTER — Emergency Department: Payer: Medicare Other

## 2022-10-01 DIAGNOSIS — M419 Scoliosis, unspecified: Secondary | ICD-10-CM | POA: Diagnosis present

## 2022-10-01 DIAGNOSIS — E785 Hyperlipidemia, unspecified: Secondary | ICD-10-CM | POA: Diagnosis present

## 2022-10-01 DIAGNOSIS — I2510 Atherosclerotic heart disease of native coronary artery without angina pectoris: Secondary | ICD-10-CM | POA: Diagnosis present

## 2022-10-01 DIAGNOSIS — F32A Depression, unspecified: Secondary | ICD-10-CM | POA: Diagnosis present

## 2022-10-01 DIAGNOSIS — K3580 Unspecified acute appendicitis: Secondary | ICD-10-CM | POA: Diagnosis present

## 2022-10-01 DIAGNOSIS — Z888 Allergy status to other drugs, medicaments and biological substances status: Secondary | ICD-10-CM | POA: Diagnosis not present

## 2022-10-01 DIAGNOSIS — M19012 Primary osteoarthritis, left shoulder: Secondary | ICD-10-CM | POA: Diagnosis present

## 2022-10-01 DIAGNOSIS — Z9682 Presence of neurostimulator: Secondary | ICD-10-CM | POA: Diagnosis not present

## 2022-10-01 DIAGNOSIS — Z96652 Presence of left artificial knee joint: Secondary | ICD-10-CM | POA: Diagnosis present

## 2022-10-01 DIAGNOSIS — Z7982 Long term (current) use of aspirin: Secondary | ICD-10-CM | POA: Diagnosis not present

## 2022-10-01 DIAGNOSIS — F411 Generalized anxiety disorder: Secondary | ICD-10-CM | POA: Diagnosis present

## 2022-10-01 DIAGNOSIS — M19032 Primary osteoarthritis, left wrist: Secondary | ICD-10-CM | POA: Diagnosis present

## 2022-10-01 DIAGNOSIS — K353 Acute appendicitis with localized peritonitis, without perforation or gangrene: Secondary | ICD-10-CM | POA: Diagnosis present

## 2022-10-01 DIAGNOSIS — G20A1 Parkinson's disease without dyskinesia, without mention of fluctuations: Secondary | ICD-10-CM | POA: Diagnosis present

## 2022-10-01 DIAGNOSIS — I129 Hypertensive chronic kidney disease with stage 1 through stage 4 chronic kidney disease, or unspecified chronic kidney disease: Secondary | ICD-10-CM | POA: Diagnosis present

## 2022-10-01 DIAGNOSIS — Z791 Long term (current) use of non-steroidal anti-inflammatories (NSAID): Secondary | ICD-10-CM | POA: Diagnosis not present

## 2022-10-01 DIAGNOSIS — Z8547 Personal history of malignant neoplasm of testis: Secondary | ICD-10-CM | POA: Diagnosis not present

## 2022-10-01 DIAGNOSIS — Z79899 Other long term (current) drug therapy: Secondary | ICD-10-CM | POA: Diagnosis not present

## 2022-10-01 DIAGNOSIS — Z85828 Personal history of other malignant neoplasm of skin: Secondary | ICD-10-CM | POA: Diagnosis not present

## 2022-10-01 DIAGNOSIS — Z9079 Acquired absence of other genital organ(s): Secondary | ICD-10-CM | POA: Diagnosis not present

## 2022-10-01 DIAGNOSIS — N1831 Chronic kidney disease, stage 3a: Secondary | ICD-10-CM | POA: Diagnosis present

## 2022-10-01 DIAGNOSIS — Z9049 Acquired absence of other specified parts of digestive tract: Secondary | ICD-10-CM | POA: Diagnosis not present

## 2022-10-01 DIAGNOSIS — K219 Gastro-esophageal reflux disease without esophagitis: Secondary | ICD-10-CM | POA: Diagnosis present

## 2022-10-01 HISTORY — PX: LAPAROSCOPIC APPENDECTOMY: SHX408

## 2022-10-01 SURGERY — APPENDECTOMY, LAPAROSCOPIC
Anesthesia: General

## 2022-10-01 MED ORDER — DIPHENHYDRAMINE HCL 50 MG/ML IJ SOLN: 12.5000 mg | Freq: Four times a day (QID) | INTRAMUSCULAR | Status: DC | PRN

## 2022-10-01 MED ORDER — ONDANSETRON HCL 4 MG/2ML IJ SOLN
4.0000 mg | Freq: Once | INTRAMUSCULAR | Status: AC
  Administered 2022-10-01: 4 mg via INTRAVENOUS
  Filled 2022-10-01: qty 2

## 2022-10-01 MED ORDER — ONDANSETRON 4 MG PO TBDP: 4.0000 mg | ORAL_TABLET | Freq: Four times a day (QID) | ORAL | Status: DC | PRN

## 2022-10-01 MED ORDER — OXYCODONE-ACETAMINOPHEN 5-325 MG PO TABS
1.0000 | ORAL_TABLET | ORAL | Status: DC | PRN
  Administered 2022-10-01: 2 via ORAL
  Filled 2022-10-01: qty 2

## 2022-10-01 MED ORDER — FENTANYL CITRATE PF 50 MCG/ML IJ SOSY
50.0000 ug | PREFILLED_SYRINGE | Freq: Once | INTRAMUSCULAR | Status: AC
  Administered 2022-10-01: 50 ug via INTRAVENOUS
  Filled 2022-10-01: qty 1

## 2022-10-01 MED ORDER — DEXMEDETOMIDINE HCL IN NACL 200 MCG/50ML IV SOLN
INTRAVENOUS | Status: DC | PRN
  Administered 2022-10-01: 8 ug via INTRAVENOUS

## 2022-10-01 MED ORDER — LACTATED RINGERS IV SOLN: INTRAVENOUS | Status: DC | PRN

## 2022-10-01 MED ORDER — LACTATED RINGERS IV SOLN: INTRAVENOUS | Status: DC

## 2022-10-01 MED ORDER — PANTOPRAZOLE SODIUM 40 MG IV SOLR
40.0000 mg | Freq: Every day | INTRAVENOUS | Status: DC
  Administered 2022-10-01: 40 mg via INTRAVENOUS
  Filled 2022-10-01: qty 10

## 2022-10-01 MED ORDER — ONDANSETRON HCL 4 MG/2ML IJ SOLN: 4.0000 mg | Freq: Four times a day (QID) | INTRAMUSCULAR | Status: DC | PRN

## 2022-10-01 MED ORDER — SUGAMMADEX SODIUM 200 MG/2ML IV SOLN
INTRAVENOUS | Status: DC | PRN
  Administered 2022-10-01: 147 mg via INTRAVENOUS

## 2022-10-01 MED ORDER — PIPERACILLIN-TAZOBACTAM 3.375 G IVPB
INTRAVENOUS | Status: AC
  Filled 2022-10-01: qty 50

## 2022-10-01 MED ORDER — ONDANSETRON HCL 4 MG/2ML IJ SOLN
INTRAMUSCULAR | Status: DC | PRN
  Administered 2022-10-01: 4 mg via INTRAVENOUS

## 2022-10-01 MED ORDER — ROCURONIUM BROMIDE 10 MG/ML (PF) SYRINGE
PREFILLED_SYRINGE | INTRAVENOUS | Status: AC
  Filled 2022-10-01: qty 10

## 2022-10-01 MED ORDER — HYDROMORPHONE HCL 1 MG/ML IJ SOLN: 1.0000 mg | INTRAMUSCULAR | Status: DC | PRN

## 2022-10-01 MED ORDER — IOHEXOL 350 MG/ML SOLN
100.0000 mL | Freq: Once | INTRAVENOUS | Status: AC | PRN
  Administered 2022-10-01: 100 mL via INTRAVENOUS

## 2022-10-01 MED ORDER — ROCURONIUM BROMIDE 100 MG/10ML IV SOLN
INTRAVENOUS | Status: DC | PRN
  Administered 2022-10-01: 40 mg via INTRAVENOUS

## 2022-10-01 MED ORDER — SODIUM CHLORIDE 0.9 % IV SOLN: INTRAVENOUS | Status: DC

## 2022-10-01 MED ORDER — SODIUM CHLORIDE 0.9 % IR SOLN
Status: DC | PRN
  Administered 2022-10-01: 3000 mL

## 2022-10-01 MED ORDER — PROPOFOL 10 MG/ML IV BOLUS
INTRAVENOUS | Status: DC | PRN
  Administered 2022-10-01: 120 mg via INTRAVENOUS

## 2022-10-01 MED ORDER — BUPIVACAINE-EPINEPHRINE (PF) 0.25% -1:200000 IJ SOLN
INTRAMUSCULAR | Status: DC | PRN
  Administered 2022-10-01: 20 mL via PERINEURAL

## 2022-10-01 MED ORDER — DEXMEDETOMIDINE HCL IN NACL 80 MCG/20ML IV SOLN
INTRAVENOUS | Status: AC
  Filled 2022-10-01: qty 20

## 2022-10-01 MED ORDER — PROPOFOL 10 MG/ML IV BOLUS
INTRAVENOUS | Status: AC
  Filled 2022-10-01: qty 20

## 2022-10-01 MED ORDER — LIDOCAINE HCL (PF) 2 % IJ SOLN
INTRAMUSCULAR | Status: AC
  Filled 2022-10-01: qty 5

## 2022-10-01 MED ORDER — ACETAMINOPHEN 10 MG/ML IV SOLN
INTRAVENOUS | Status: AC
  Filled 2022-10-01: qty 100

## 2022-10-01 MED ORDER — DEXAMETHASONE SODIUM PHOSPHATE 10 MG/ML IJ SOLN
INTRAMUSCULAR | Status: DC | PRN
  Administered 2022-10-01: 10 mg via INTRAVENOUS

## 2022-10-01 MED ORDER — HYDROMORPHONE HCL 1 MG/ML IJ SOLN
1.0000 mg | INTRAMUSCULAR | Status: DC | PRN
  Administered 2022-10-01: 1 mg via INTRAVENOUS
  Filled 2022-10-01: qty 1

## 2022-10-01 MED ORDER — PIPERACILLIN-TAZOBACTAM 3.375 G IVPB 30 MIN
3.3750 g | Freq: Once | INTRAVENOUS | Status: AC
  Administered 2022-10-01: 3.375 g via INTRAVENOUS
  Filled 2022-10-01: qty 50

## 2022-10-01 MED ORDER — LIDOCAINE HCL (CARDIAC) PF 100 MG/5ML IV SOSY
PREFILLED_SYRINGE | INTRAVENOUS | Status: DC | PRN
  Administered 2022-10-01: 100 mg via INTRAVENOUS

## 2022-10-01 MED ORDER — 0.9 % SODIUM CHLORIDE (POUR BTL) OPTIME
TOPICAL | Status: DC | PRN
  Administered 2022-10-01: 500 mL

## 2022-10-01 MED ORDER — PIPERACILLIN-TAZOBACTAM 3.375 G IVPB
3.3750 g | Freq: Three times a day (TID) | INTRAVENOUS | Status: DC
  Administered 2022-10-01 – 2022-10-02 (×3): 3.375 g via INTRAVENOUS
  Filled 2022-10-01 (×2): qty 50

## 2022-10-01 MED ORDER — ONDANSETRON HCL 4 MG/2ML IJ SOLN
INTRAMUSCULAR | Status: AC
  Filled 2022-10-01: qty 2

## 2022-10-01 MED ORDER — HYDRALAZINE HCL 20 MG/ML IJ SOLN: 10.0000 mg | INTRAMUSCULAR | Status: DC | PRN

## 2022-10-01 MED ORDER — DIPHENHYDRAMINE HCL 12.5 MG/5ML PO ELIX: 12.5000 mg | ORAL_SOLUTION | Freq: Four times a day (QID) | ORAL | Status: DC | PRN

## 2022-10-01 MED ORDER — SODIUM CHLORIDE 0.9 % IV BOLUS (SEPSIS)
1000.0000 mL | Freq: Once | INTRAVENOUS | Status: AC
  Administered 2022-10-01: 1000 mL via INTRAVENOUS

## 2022-10-01 MED ORDER — ACETAMINOPHEN 10 MG/ML IV SOLN
INTRAVENOUS | Status: DC | PRN
  Administered 2022-10-01: 1000 mg via INTRAVENOUS

## 2022-10-01 MED ORDER — FENTANYL CITRATE PF 50 MCG/ML IJ SOSY
50.0000 ug | PREFILLED_SYRINGE | INTRAMUSCULAR | Status: DC | PRN
  Administered 2022-10-01 (×2): 50 ug via INTRAVENOUS
  Filled 2022-10-01 (×2): qty 1

## 2022-10-01 MED ORDER — DEXAMETHASONE SODIUM PHOSPHATE 10 MG/ML IJ SOLN
INTRAMUSCULAR | Status: AC
  Filled 2022-10-01: qty 1

## 2022-10-01 MED ORDER — FENTANYL CITRATE (PF) 100 MCG/2ML IJ SOLN
INTRAMUSCULAR | Status: AC
  Filled 2022-10-01: qty 2

## 2022-10-01 MED ORDER — FENTANYL CITRATE (PF) 100 MCG/2ML IJ SOLN: 25.0000 ug | INTRAMUSCULAR | Status: DC | PRN

## 2022-10-01 MED ORDER — FENTANYL CITRATE (PF) 100 MCG/2ML IJ SOLN
INTRAMUSCULAR | Status: DC | PRN
  Administered 2022-10-01 (×2): 25 ug via INTRAVENOUS
  Administered 2022-10-01: 50 ug via INTRAVENOUS

## 2022-10-01 MED ORDER — ONDANSETRON HCL 4 MG/2ML IJ SOLN: 4.0000 mg | Freq: Once | INTRAMUSCULAR | Status: DC | PRN

## 2022-10-01 SURGICAL SUPPLY — 47 items
BAG PRESSURE INF REUSE 3000 (BAG) IMPLANT
BLADE CLIPPER SURG (BLADE) IMPLANT
CANNULA REDUC XI 12-8 STAPL (CANNULA)
CANNULA REDUCER 12-8 DVNC XI (CANNULA) IMPLANT
COVER TIP SHEARS 8 DVNC (MISCELLANEOUS) ×1 IMPLANT
COVER TIP SHEARS 8MM DA VINCI (MISCELLANEOUS) ×1
CUTTER FLEX LINEAR 45M (STAPLE) IMPLANT
DERMABOND ADVANCED .7 DNX12 (GAUZE/BANDAGES/DRESSINGS) ×1 IMPLANT
GLOVE ORTHO TXT STRL SZ7.5 (GLOVE) ×2 IMPLANT
GOWN STRL REUS W/ TWL LRG LVL3 (GOWN DISPOSABLE) ×1 IMPLANT
GOWN STRL REUS W/ TWL XL LVL3 (GOWN DISPOSABLE) ×2 IMPLANT
GOWN STRL REUS W/TWL LRG LVL3 (GOWN DISPOSABLE) ×1
GOWN STRL REUS W/TWL XL LVL3 (GOWN DISPOSABLE) ×2
GRASPER SUT TROCAR 14GX15 (MISCELLANEOUS) IMPLANT
IRRIGATION STRYKERFLOW (MISCELLANEOUS) IMPLANT
IRRIGATOR STRYKERFLOW (MISCELLANEOUS) ×1
IRRIGATOR SUCT 8 DISP DVNC XI (IRRIGATION / IRRIGATOR) IMPLANT
IRRIGATOR SUCTION 8MM XI DISP (IRRIGATION / IRRIGATOR)
IV NS IRRIG 3000ML ARTHROMATIC (IV SOLUTION) IMPLANT
KIT PINK PAD W/HEAD ARE REST (MISCELLANEOUS) IMPLANT
KIT PINK PAD W/HEAD ARM REST (MISCELLANEOUS) ×1 IMPLANT
KIT TURNOVER KIT A (KITS) ×1 IMPLANT
LABEL OR SOLS (LABEL) ×1 IMPLANT
MANIFOLD NEPTUNE II (INSTRUMENTS) ×1 IMPLANT
NDL INSUFFLATION 14GA 120MM (NEEDLE) IMPLANT
NEEDLE HYPO 22GX1.5 SAFETY (NEEDLE) ×1 IMPLANT
NEEDLE INSUFFLATION 14GA 120MM (NEEDLE) IMPLANT
NS IRRIG 500ML POUR BTL (IV SOLUTION) ×1 IMPLANT
PACK LAP CHOLECYSTECTOMY (MISCELLANEOUS) ×1 IMPLANT
RELOAD 45 VASCULAR/THIN (ENDOMECHANICALS) ×1 IMPLANT
RELOAD STAPLE 45 2.5 WHT GRN (ENDOMECHANICALS) IMPLANT
SEAL CANN UNIV 5-8 DVNC XI (MISCELLANEOUS) ×3 IMPLANT
SEAL XI 5MM-8MM UNIVERSAL (MISCELLANEOUS)
SET TUBE SMOKE EVAC HIGH FLOW (TUBING) ×1 IMPLANT
SPIKE FLUID TRANSFER (MISCELLANEOUS) ×1 IMPLANT
STAPLER CANNULA SEAL DVNC XI (STAPLE) ×1 IMPLANT
STAPLER CANNULA SEAL XI (STAPLE)
SUT MNCRL 4-0 (SUTURE) ×1
SUT MNCRL 4-0 27XMFL (SUTURE) ×1
SUT VIC AB 0 SH 27 (SUTURE) ×1 IMPLANT
SUT VICRYL 0 UR6 27IN ABS (SUTURE) ×1 IMPLANT
SUTURE MNCRL 4-0 27XMF (SUTURE) ×1 IMPLANT
SYS BAG RETRIEVAL 10MM (BASKET) ×1
SYSTEM BAG RETRIEVAL 10MM (BASKET) ×1 IMPLANT
TRAP FLUID SMOKE EVACUATOR (MISCELLANEOUS) ×1 IMPLANT
TRAY FOLEY SLVR 16FR LF STAT (SET/KITS/TRAYS/PACK) ×1 IMPLANT
TROCAR Z-THREAD FIOS 11X100 BL (TROCAR) ×1 IMPLANT

## 2022-10-01 NOTE — Anesthesia Preprocedure Evaluation (Signed)
Anesthesia Evaluation  Patient identified by MRN, date of birth, ID band Patient awake    Reviewed: Allergy & Precautions, H&P , NPO status , Patient's Chart, lab work & pertinent test results, reviewed documented beta blocker date and time   History of Anesthesia Complications Negative for: history of anesthetic complications  Airway Mallampati: III  TM Distance: >3 FB Neck ROM: full  Mouth opening: Limited Mouth Opening  Dental  (+) Dental Advidsory Given, Teeth Intact   Pulmonary neg pulmonary ROS   Pulmonary exam normal breath sounds clear to auscultation       Cardiovascular Exercise Tolerance: Good hypertension, (-) angina + CAD  (-) Past MI and (-) Cardiac Stents Normal cardiovascular exam(-) dysrhythmias (-) Valvular Problems/Murmurs Rhythm:regular Rate:Normal     Neuro/Psych neg Seizures PSYCHIATRIC DISORDERS Anxiety Depression    Parkinson's and s/p deep brain stimulator    GI/Hepatic Neg liver ROS,GERD  ,,  Endo/Other  negative endocrine ROS    Renal/GU Renal disease (CKD)  negative genitourinary   Musculoskeletal   Abdominal   Peds  Hematology negative hematology ROS (+)   Anesthesia Other Findings Past Medical History: No date: Anxiety 09/04/2017: Basal cell carcinoma     Comment:  left infrascapular mid back 09/28/2015: Basal cell carcinoma     Comment:  left low back 11 cm lat to spinal scar 09/28/2015: Basal cell carcinoma     Comment:  left posterior waistline 5.5 cm lateral to spinal scar 07/06/2015: Basal cell carcinoma     Comment:  left medial low back 07/06/2015: Basal cell carcinoma     Comment:  left low back lateral 05/06/2015: Basal cell carcinoma     Comment:  right lower back 09/16/2021: Basal cell carcinoma     Comment:  right medial mid chest, [facility] 12/01/21 09/16/2021: Basal cell carcinoma     Comment:  left mid medial back, [facility] 12/01/21 09/16/2021: Basal cell carcinoma      Comment:  right mid lower back 04/06/2022: Basal cell carcinoma     Comment:  low back spinal above sacral - [facility] 04/06/2022: Basal cell carcinoma     Comment:  Right post waistline - [facility] No date: Cancer ([place]) No date: Depression No date: Hypertension No date: Parkinson's disease 2022: Squamous cell carcinoma of skin     Comment:  tongue   Reproductive/Obstetrics negative OB ROS                             Anesthesia Physical Anesthesia Plan  ASA: 2  Anesthesia Plan: General   Post-op Pain Management:    Induction: Intravenous  PONV Risk Score and Plan: 2 and Ondansetron, Dexamethasone and Treatment may vary due to age or medical condition  Airway Management Planned: Oral ETT  Additional Equipment:   Intra-op Plan:   Post-operative Plan: Extubation in OR  Informed Consent: I have reviewed the patients History and Physical, chart, labs and discussed the procedure including the risks, benefits and alternatives for the proposed anesthesia with the patient or authorized representative who has indicated his/her understanding and acceptance.     Dental Advisory Given  Plan Discussed with: Anesthesiologist, CRNA and Surgeon  Anesthesia Plan Comments:        Anesthesia Quick Evaluation

## 2022-10-01 NOTE — Anesthesia Procedure Notes (Signed)
Procedure Name: Intubation Date/Time: 10/01/2022 8:35 AM  Performed by: Doreen Salvage, CRNAPre-anesthesia Checklist: Patient identified, Patient being monitored, Timeout performed, Emergency Drugs available and Suction available Patient Re-evaluated:Patient Re-evaluated prior to induction Oxygen Delivery Method: Circle system utilized Preoxygenation: Pre-oxygenation with 100% oxygen Induction Type: IV induction Ventilation: Mask ventilation without difficulty Laryngoscope Size: Mac, 4 and McGraph Grade View: Grade I Tube type: Oral Tube size: 7.5 mm Number of attempts: 1 Airway Equipment and Method: Stylet Placement Confirmation: ETT inserted through vocal cords under direct vision, positive ETCO2 and breath sounds checked- equal and bilateral Secured at: 23 cm Tube secured with: Tape Dental Injury: Teeth and Oropharynx as per pre-operative assessment

## 2022-10-01 NOTE — H&P (Signed)
Patient ID: Charles Choi, male   DOB: 19-Nov-1950, 71 y.o.   MRN: [id number]  Chief Complaint: Right lower quadrant pain  History of Present Illness Charles Choi is a 71 y.o. male with a history of acute onset and progressive right lower quadrant pain that began Thursday evening, this Saturday morning.  He reports chills with the perceived fever.  Progression of pain with anorexia.  No prior similar history of pain.  Movement exacerbates the pain. He has a prolonged complicated history of multiple surgeries involving treatments and complications of his Parkinson's.  Past Medical History Past Medical History:  Diagnosis Date   Anxiety    Basal cell carcinoma 09/04/2017   left infrascapular mid back   Basal cell carcinoma 09/28/2015   left low back 11 cm lat to spinal scar   Basal cell carcinoma 09/28/2015   left posterior waistline 5.5 cm lateral to spinal scar   Basal cell carcinoma 07/06/2015   left medial low back   Basal cell carcinoma 07/06/2015   left low back lateral   Basal cell carcinoma 05/06/2015   right lower back   Basal cell carcinoma 09/16/2021   right medial mid chest, [facility] 12/01/21   Basal cell carcinoma 09/16/2021   left mid medial back, [facility] 12/01/21   Basal cell carcinoma 09/16/2021   right mid lower back   Basal cell carcinoma 04/06/2022   low back spinal above sacral - [facility]   Basal cell carcinoma 04/06/2022   Right post waistline - [facility]   Cancer ([place])    Depression    Hypertension    Parkinson's disease    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      Past Surgical History:  Procedure Laterality Date   BACK SURGERY     CHOLECYSTECTOMY     DEEP BRAIN STIMULATOR PLACEMENT     for Parkinson's disease   JOINT REPLACEMENT     left knee x2   ORCHIECTOMY  1983    Allergies  Allergen Reactions   Phenergan [Promethazine Hcl] Other (See Comments)    Unknown    Reglan [Metoclopramide] Other (See Comments)    Pass out     Current  Facility-Administered Medications  Medication Dose Route Frequency Provider Last Rate Last Admin   0.9 %  sodium chloride infusion   Intravenous Continuous Ward, Kristen N, DO       fentaNYL (SUBLIMAZE) injection 50 mcg  50 mcg Intravenous Q2H PRN Ward, Kristen N, DO       ondansetron (ZOFRAN) injection 4 mg  4 mg Intravenous Q6H PRN Ward, Kristen N, DO       piperacillin-tazobactam (ZOSYN) IVPB 3.375 g  3.375 g Intravenous Once Ward, Kristen N, DO 100 mL/hr at 10/01/22 0253 3.375 g at 10/01/22 3500   Current Outpatient Medications  Medication Sig Dispense Refill   acetaminophen (TYLENOL) 325 MG tablet Take 925 mg by mouth every 6 (six) hours as needed for mild pain or moderate pain.     amantadine (SYMMETREL) 100 MG capsule Take 1 capsule (100 mg total) by mouth daily. 30 capsule 0   aspirin 325 MG tablet Take 325 mg by mouth 2 (two) times daily.     atorvastatin (LIPITOR) 20 MG tablet Take 1 tablet (20 mg total) by mouth daily. 30 tablet 0   carbidopa-levodopa (SINEMET IR) 25-100 MG tablet Take 1 tablet orally two times a day at 7am, and 10pm. Take 0.5 tablet orally at 10am, 1pm, 4pm, and 7pm.  60 tablet 0   clonazePAM (KLONOPIN) 1 MG tablet Take 1 mg by mouth at bedtime as needed.     doxycycline (MONODOX) 100 MG capsule Take one cap po BID x 10 days. Then one tab po QD x 20 days. Take with food. 40 capsule 0   ketoconazole (NIZORAL) 2 % cream Apply 1 application topically daily. Apply to flaky areas daily as needed     losartan (COZAAR) 100 MG tablet Take 1 tablet (100 mg total) by mouth daily. 100 tablet 0   Melatonin 3 MG CAPS Take 1-2 tablets by mouth 2 hours before bedtime     meloxicam (MOBIC) 7.5 MG tablet Take 1 tablet (7.5 mg total) by mouth daily. 30 tablet 0   metoprolol succinate (TOPROL-XL) 25 MG 24 hr tablet Take 1 tablet (25 mg total) by mouth 2 (two) times daily. 60 tablet 0   metoprolol tartrate (LOPRESSOR) 25 MG tablet TAKE 1 TABLET TWICE DAILY     mirabegron ER (MYRBETRIQ)  25 MG TB24 tablet Take 1 tablet (25 mg total) by mouth daily. 30 tablet 12   NON FORMULARY Diet Type: Regular     omeprazole (PRILOSEC OTC) 20 MG tablet Take 40 mg by mouth daily.      omeprazole (PRILOSEC) 40 MG capsule Take 1 capsule by mouth daily.     Potassium Citrate 15 MEQ (1620 MG) TBCR Take 1 tablet by mouth 2 (two) times daily. 60 tablet 0   ranolazine (RANEXA) 500 MG 12 hr tablet      rOPINIRole (REQUIP) 2 MG tablet Take 1 tablet (2 mg total) by mouth every 3 (three) hours. At 7am, and 1pm. (Patient taking differently: Take 1.5 mg by mouth every 3 (three) hours. At 7am, and 1pm.) 60 tablet 0   selegiline (ELDEPRYL) 5 MG capsule Take 1 capsule (5 mg total) by mouth 2 (two) times daily. At 7am and 1pm. 60 capsule 0   tadalafil (CIALIS) 20 MG tablet Take 1 tablet (20 mg total) by mouth daily. 30 tablet 60   testosterone cypionate (DEPOTESTOSTERONE CYPIONATE) 200 MG/ML injection Inject 1 mL (200 mg total) into the muscle every 14 (fourteen) days. 4 mL 0   traZODone (DESYREL) 150 MG tablet Take 0.5 tablets (75 mg total) by mouth at bedtime as needed. 30 tablet 0   venlafaxine XR (EFFEXOR-XR) 150 MG 24 hr capsule Take 150 mg by mouth at bedtime.      Family History History reviewed. No pertinent family history.    Social History Social History   Tobacco Use   Smoking status: Never   Smokeless tobacco: Never  Substance Use Topics   Alcohol use: Yes    Comment: 1 drink per week   Drug use: Yes    Types: Marijuana      Physical Exam Blood pressure (!) 152/70, pulse (!) 110, temperature 98 F (36.7 C), resp. rate 16, height '5\' 6"'$  (1.676 m), weight 73.5 kg, SpO2 98 %. Last Weight  Most recent update: 09/30/2022  7:18 PM    Weight  73.5 kg (162 lb)             CONSTITUTIONAL: Well developed, and nourished, appropriately responsive and aware without distress.   EYES: Sclera non-icteric.   EARS, NOSE, MOUTH AND THROAT:  The oropharynx is clear.   Hearing is intact to voice.   NECK: Trachea is midline, and there is no jugular venous distension.  LYMPH NODES:  Lymph nodes in the neck are not appreciated. RESPIRATORY:  Lungs are clear, and breath sounds are equal bilaterally. Normal respiratory effort without pathologic use of accessory muscles. CARDIOVASCULAR: Heart is regular in rate and rhythm.  Well perfused.  GI: The abdomen is notable for a left upper quadrant implant, some voluntary and involuntary guarding in the right lower quadrant. Nondistended. I did not appreciate hepatosplenomegaly.  MUSCULOSKELETAL: Some parkinsonian rigidity.  SKIN: Skin turgor is normal. No pathologic skin lesions appreciated.  NEUROLOGIC:  Motor movement appears grossly consistent with Parkinson's. PSYCH:  Alert and oriented to person, place and time. Affect is appropriate for situation.  Data Reviewed I have personally reviewed what is currently available of the patient's imaging, recent labs and medical records.   Labs:     Latest Ref Rng & Units 09/30/2022    7:19 PM 05/03/2022    9:22 AM 01/18/2022    8:37 AM  CBC  WBC 4.0 - 10.5 K/uL 16.8     Hemoglobin 13.0 - 17.0 g/dL 15.0     Hematocrit 39.0 - 52.0 % 49.2  44.9  43.9   Platelets 150 - 400 K/uL 251         Latest Ref Rng & Units 09/30/2022    7:19 PM 01/31/2021   10:16 PM 08/21/2020   12:26 PM  CMP  Glucose 70 - 99 mg/dL 96  101  113   BUN 8 - 23 mg/dL '23  27  19   '$ Creatinine 0.61 - 1.24 mg/dL 1.37  1.28  1.30   Sodium 135 - 145 mmol/L 141  142  138   Potassium 3.5 - 5.1 mmol/L 4.2  4.3  3.6   Chloride 98 - 111 mmol/L 105  107  105   CO2 22 - 32 mmol/L '22  27  21   '$ Calcium 8.9 - 10.3 mg/dL 9.6  9.5  9.1   Total Protein 6.5 - 8.1 g/dL 7.5  7.8  6.7   Total Bilirubin 0.3 - 1.2 mg/dL 1.8  1.1  1.2   Alkaline Phos 38 - 126 U/L 90  80  67   AST 15 - 41 U/L 36  44  29   ALT 0 - 44 U/L 15  48  22       Imaging: Radiological images reviewed:   Within last 24 hrs: CT ABDOMEN PELVIS W CONTRAST  Result Date:  10/01/2022 CLINICAL DATA:  Right lower quadrant pain EXAM: CT ABDOMEN AND PELVIS WITH CONTRAST TECHNIQUE: Multidetector CT imaging of the abdomen and pelvis was performed using the standard protocol following bolus administration of intravenous contrast. RADIATION DOSE REDUCTION: This exam was performed according to the departmental dose-optimization program which includes automated exposure control, adjustment of the mA and/or kV according to patient size and/or use of iterative reconstruction technique. CONTRAST:  19m OMNIPAQUE IOHEXOL 350 MG/ML SOLN COMPARISON:  01/31/2021 FINDINGS: Lower chest: Mild basilar atelectasis is noted left greater than right. Hepatobiliary: Gallbladder has been surgically removed. Liver is well visualized and within normal limits. Pancreas: Unremarkable. No pancreatic ductal dilatation or surrounding inflammatory changes. Spleen: Normal in size without focal abnormality. Adrenals/Urinary Tract: Adrenal glands are within normal limits. Kidneys show no renal calculi or obstructive changes. Delayed images demonstrate normal excretion of contrast material. The bladder is well distended. Stomach/Bowel: The appendix is dilated to 12 mm with both central and peripheral appendicoliths. Periappendiceal inflammatory changes are noted consistent with acute appendicitis. Colon shows no obstructive or inflammatory changes. The small bowel and stomach are within normal limits. Vascular/Lymphatic: Aortic atherosclerosis. No enlarged  abdominal or pelvic lymph nodes. Reproductive: Prostate is unremarkable. Other: No abdominal wall hernia or abnormality. No abdominopelvic ascites. Stimulator pack is noted lower left. Musculoskeletal: Postsurgical changes are noted in the lower lumbar spine consistent with prior fusion. Scoliosis concave to the left is noted. No compression deformities are seen. IMPRESSION: Changes consistent with acute appendicitis without evidence of perforation. Critical  Value/emergent results were called by telephone at the time of interpretation on 10/01/2022 at 2:18 am to Dr. Pryor Curia , who verbally acknowledged these results. Electronically Signed   By: Inez Catalina M.D.   On: 10/01/2022 02:20    Assessment    Acute appendicitis Patient Active Problem List   Diagnosis Date Noted   Arthritis of left shoulder region 05/08/2019   Abnormal glucose 12/05/2018   Essential hypertension, benign 09/15/2018   Dyslipidemia 09/15/2018   Abnormal stress test 07/20/2018   Coronary artery disease involving native coronary artery of native heart 07/20/2018   CKD (chronic kidney disease) stage 3, GFR 30-59 ml/min (HCC) 07/13/2018   Erectile dysfunction 07/13/2018   Insomnia 07/13/2018   Hypogonadism in male 06/18/2018   Testicular cancer ([place]) 06/13/2018   Hyperlipidemia 06/13/2018   Depression 06/13/2018   Anxiety 06/13/2018   Callus of foot 03/27/2018   Pes planovalgus, acquired, right 01/30/2018   Arthritis of midfoot 01/30/2018   Achilles tendon contracture, right 01/30/2018   Left shoulder pain 07/26/2017   Infection of prosthetic left knee joint ([place]) 07/10/2017   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[place]) 05/15/2017   GERD (gastroesophageal reflux disease) 03/20/2017   Primary osteoarthritis of one knee, right 12/30/2016   H/O total knee replacement, left 12/30/2016   Moderate recurrent major depression ([place]) 08/15/2016   Chest pain due to myocardial ischemia 08/14/2016   Chest pain, rule out acute myocardial infarction 08/14/2016   Chest x-ray abnormality 10/17/2013   Scoliosis 07/11/2012   Arthritis of wrist, left, degenerative 07/11/2012   Parkinson's disease 01/07/2012    Plan    Laparoscopic appendectomy.  The risks, benefits, complications, treatment options, and expected outcomes were discussed with the patient. The treatment of antibiotics alone was discussed giving a 20% chance that this could fail and surgery would be necessary.   Also discussed continuing to the operating room for Laparoscopic Appendectomy.  The possibilities of  bleeding, recurrent infection, perforation of viscus, finding a normal appendix, the need for additional procedures, failure to diagnose a condition, conversion to open procedure and creating a complication requiring transfusion or further operations were discussed. The patient was given the opportunity to ask questions and have them answered.  Patient would like to proceed with Laparoscopic Appendectomy and consent was obtained.     Latest Ref Rng & Units 09/30/2022    7:19 PM 05/03/2022    9:22 AM 01/18/2022    8:37 AM  CBC  WBC 4.0 - 10.5 K/uL 16.8     Hemoglobin 13.0 - 17.0 g/dL 15.0     Hematocrit 39.0 - 52.0 % 49.2  44.9  43.9   Platelets 150 - 400 K/uL 251        Face-to-face time spent with the patient and accompanying care providers(if present) was 30 minutes, with more than 50% of the time spent counseling, educating, and coordinating care of the patient.    These notes generated with voice recognition software. I apologize for typographical errors.  Ronny Bacon M.D., FACS 10/01/2022, 3:22 AM

## 2022-10-01 NOTE — ED Notes (Signed)
Retail banker to page MD

## 2022-10-01 NOTE — Anesthesia Postprocedure Evaluation (Signed)
Anesthesia Post Note  Patient: Charles Choi  Procedure(s) Performed: APPENDECTOMY LAPAROSCOPIC  Patient location during evaluation: PACU Anesthesia Type: General Level of consciousness: awake and alert Pain management: pain level controlled Vital Signs Assessment: post-procedure vital signs reviewed and stable Respiratory status: spontaneous breathing, nonlabored ventilation, respiratory function stable and patient connected to nasal cannula oxygen Cardiovascular status: blood pressure returned to baseline and stable Postop Assessment: no apparent nausea or vomiting Anesthetic complications: no   No notable events documented.   Last Vitals:  Vitals:   10/01/22 1326 10/01/22 1346  BP: 134/76 (!) 142/83  Pulse:  76  Resp:  18  Temp: 36.6 C 36.6 C  SpO2:  97%    Last Pain:  Vitals:   10/01/22 1346  TempSrc: Oral  PainSc: 0-No pain                 Martha Clan

## 2022-10-01 NOTE — ED Provider Notes (Signed)
[facility] Provider Note    Event Date/Time   First MD Initiated Contact with Patient 10/01/22 0034     (approximate)   History   Abdominal Pain   HPI  Charles Choi is a 71 y.o. male history of hypertension, Parkinson's who presents to the emergency department with right lower quadrant pain that started yesterday, subjective fevers and chills.  No vomiting or diarrhea.  No dysuria or hematuria.  Has had previous cholecystectomy, orchiectomy.   History provided by patient and sister.    Past Medical History:  Diagnosis Date   Anxiety    Basal cell carcinoma 09/04/2017   left infrascapular mid back   Basal cell carcinoma 09/28/2015   left low back 11 cm lat to spinal scar   Basal cell carcinoma 09/28/2015   left posterior waistline 5.5 cm lateral to spinal scar   Basal cell carcinoma 07/06/2015   left medial low back   Basal cell carcinoma 07/06/2015   left low back lateral   Basal cell carcinoma 05/06/2015   right lower back   Basal cell carcinoma 09/16/2021   right medial mid chest, [facility] 12/01/21   Basal cell carcinoma 09/16/2021   left mid medial back, [facility] 12/01/21   Basal cell carcinoma 09/16/2021   right mid lower back   Basal cell carcinoma 04/06/2022   low back spinal above sacral - [facility]   Basal cell carcinoma 04/06/2022   Right post waistline - [facility]   Cancer ([place])    Depression    Hypertension    Parkinson's disease    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 Authorizing Provider  acetaminophen (TYLENOL) 325 MG tablet Take 925 mg by mouth every 6 (six) hours as needed for mild pain or moderate pain. 08/30/18    [provider]  amantadine (SYMMETREL) 100 MG capsule Take 1 capsule (100 mg total) by mouth daily. 09/19/18   Gerlene Fee, NP  aspirin 325 MG tablet Take 325 mg by mouth 2 (two) times daily. 08/30/18   [provider]  atorvastatin (LIPITOR) 20 MG tablet Take 1 tablet (20 mg total) by mouth daily. 09/19/18   Gerlene Fee, NP  carbidopa-levodopa (SINEMET IR) 25-100 MG tablet Take 1 tablet orally two times a day at 7am, and 10pm. Take 0.5 tablet orally at 10am, 1pm, 4pm, and 7pm. 09/19/18   Gerlene Fee, NP  clonazePAM (KLONOPIN) 1 MG tablet Take 1 mg by mouth at bedtime as needed. 09/13/22   [provider]  doxycycline (MONODOX) 100 MG capsule Take one cap po BID x 10 days. Then one tab po QD x 20 days. Take with food. 09/19/22   Ralene Bathe, MD  ketoconazole (NIZORAL) 2 % cream Apply 1 application topically daily. Apply to flaky areas daily as needed    [provider]  losartan (COZAAR) 100 MG tablet Take 1 tablet (100 mg total) by mouth daily. 09/19/18   Gerlene Fee, NP  Melatonin 3 MG CAPS Take 1-2 tablets by mouth 2 hours before bedtime    [provider]  meloxicam (MOBIC) 7.5 MG tablet Take 1 tablet (7.5 mg total) by mouth daily. 09/19/18   Gerlene Fee, NP  metoprolol succinate (TOPROL-XL) 25 MG 24 hr tablet Take 1 tablet (25 mg total) by mouth 2 (two) times daily. 09/19/18   Gerlene Fee, NP  metoprolol tartrate (LOPRESSOR) 25 MG tablet TAKE 1 TABLET TWICE DAILY 06/28/19   [provider]  mirabegron ER (MYRBETRIQ) 25 MG TB24 tablet Take 1 tablet (25 mg total) by mouth daily. 05/11/22   Stoioff, Ronda Fairly, MD  NON FORMULARY Diet Type: Regular    [provider]  omeprazole (PRILOSEC OTC) 20 MG tablet Take 40 mg by mouth daily.  08/30/18   [provider]  omeprazole (PRILOSEC) 40 MG capsule Take 1 capsule by mouth daily. 05/01/20   [provider]  Potassium Citrate 15 MEQ (1620 MG)  TBCR Take 1 tablet by mouth 2 (two) times daily. 09/19/18   Gerlene Fee, NP  ranolazine (RANEXA) 500 MG 12 hr tablet  06/20/19   [provider]  rOPINIRole (REQUIP) 2 MG tablet Take 1 tablet (2 mg total) by mouth every 3 (three) hours. At 7am, and 1pm. Patient taking differently: Take 1.5 mg by mouth every 3 (three) hours. At 7am, and 1pm. 09/19/18   Gerlene Fee, NP  selegiline (ELDEPRYL) 5 MG capsule Take 1 capsule (5 mg total) by mouth 2 (two) times daily. At 7am and 1pm. 09/19/18   Gerlene Fee, NP  tadalafil (CIALIS) 20 MG tablet Take 1 tablet (20 mg total) by mouth daily. 07/21/21   Stoioff, Ronda Fairly, MD  testosterone cypionate (DEPOTESTOSTERONE CYPIONATE) 200 MG/ML injection Inject 1 mL (200 mg total) into the muscle every 14 (fourteen) days. 03/13/22   Stoioff, Ronda Fairly, MD  traZODone (DESYREL) 150 MG tablet Take 0.5 tablets (75 mg total) by mouth at bedtime as needed. 09/19/18   Gerlene Fee, NP  venlafaxine XR (EFFEXOR-XR) 150 MG 24 hr capsule Take 150 mg by mouth at bedtime. 09/14/22 09/14/23  [provider]    Physical Exam   Triage Vital Signs: ED Triage Vitals  Enc Vitals Group     BP 09/30/22 1916 134/86     Pulse Rate 09/30/22 1916 (!) 111     Resp 09/30/22 1916 17     Temp 09/30/22 1916 98.4 F (36.9 C)     Temp Source 09/30/22 1916 Oral     SpO2 09/30/22 1916 100 %     Weight 09/30/22 1917 162 lb (73.5 kg)     Height 09/30/22 1917 '5\' 6"'$  (1.676 m)     Head Circumference --      Peak Flow --      Pain Score 09/30/22 1917 3     Pain Loc --      Pain Edu? --      Excl. in [place]? --     Most recent vital signs: Vitals:   10/01/22 0300 10/01/22 0335  BP: (!) 152/70 (!) 148/92  Pulse:  86  Resp:  18  Temp:  98.2 F (36.8 C)  SpO2:  97%    CONSTITUTIONAL: Alert and oriented and responds appropriately to questions. Well-appearing; well-nourished HEAD: Normocephalic, atraumatic EYES: Conjunctivae clear, pupils appear equal, sclera  nonicteric ENT: normal nose; moist mucous membranes NECK: Supple, normal ROM CARD: RRR; S1 and S2 appreciated; no murmurs, no clicks, no rubs, no gallops RESP: Normal chest excursion without splinting or tachypnea; breath sounds clear and  equal bilaterally; no wheezes, no rhonchi, no rales, no hypoxia or respiratory distress, speaking full sentences ABD/GI: Normal bowel sounds; non-distended; soft, tender to palpation at McBurney's point with guarding BACK: The back appears normal EXT: Normal ROM in all joints; no deformity noted, no edema; no cyanosis SKIN: Normal color for age and race; warm; no rash on exposed skin NEURO: Moves all extremities equally, normal speech PSYCH: The patient's mood and manner are appropriate.   ED Results / Procedures / Treatments   LABS: (all labs ordered are listed, but only abnormal results are displayed) Labs Reviewed  COMPREHENSIVE METABOLIC PANEL - Abnormal; Notable for the following components:      Result Value   Creatinine, Ser 1.37 (*)    Total Bilirubin 1.8 (*)    GFR, Estimated 55 (*)    All other components within normal limits  CBC - Abnormal; Notable for the following components:   WBC 16.8 (*)    RBC 6.08 (*)    MCH 24.7 (*)    RDW 20.7 (*)    All other components within normal limits  URINALYSIS, ROUTINE W REFLEX MICROSCOPIC - Abnormal; Notable for the following components:   Color, Urine YELLOW (*)    APPearance CLEAR (*)    Glucose, UA 50 (*)    Hgb urine dipstick SMALL (*)    Ketones, ur 5 (*)    Protein, ur 100 (*)    All other components within normal limits  LIPASE, BLOOD     EKG:   RADIOLOGY: My personal review and interpretation of imaging: CT scan shows acute appendicitis.  I have personally reviewed all radiology reports.   CT ABDOMEN PELVIS W CONTRAST  Result Date: 10/01/2022 CLINICAL DATA:  Right lower quadrant pain EXAM: CT ABDOMEN AND PELVIS WITH CONTRAST TECHNIQUE: Multidetector CT imaging of the abdomen  and pelvis was performed using the standard protocol following bolus administration of intravenous contrast. RADIATION DOSE REDUCTION: This exam was performed according to the departmental dose-optimization program which includes automated exposure control, adjustment of the mA and/or kV according to patient size and/or use of iterative reconstruction technique. CONTRAST:  174m OMNIPAQUE IOHEXOL 350 MG/ML SOLN COMPARISON:  01/31/2021 FINDINGS: Lower chest: Mild basilar atelectasis is noted left greater than right. Hepatobiliary: Gallbladder has been surgically removed. Liver is well visualized and within normal limits. Pancreas: Unremarkable. No pancreatic ductal dilatation or surrounding inflammatory changes. Spleen: Normal in size without focal abnormality. Adrenals/Urinary Tract: Adrenal glands are within normal limits. Kidneys show no renal calculi or obstructive changes. Delayed images demonstrate normal excretion of contrast material. The bladder is well distended. Stomach/Bowel: The appendix is dilated to 12 mm with both central and peripheral appendicoliths. Periappendiceal inflammatory changes are noted consistent with acute appendicitis. Colon shows no obstructive or inflammatory changes. The small bowel and stomach are within normal limits. Vascular/Lymphatic: Aortic atherosclerosis. No enlarged abdominal or pelvic lymph nodes. Reproductive: Prostate is unremarkable. Other: No abdominal wall hernia or abnormality. No abdominopelvic ascites. Stimulator pack is noted lower left. Musculoskeletal: Postsurgical changes are noted in the lower lumbar spine consistent with prior fusion. Scoliosis concave to the left is noted. No compression deformities are seen. IMPRESSION: Changes consistent with acute appendicitis without evidence of perforation. Critical Value/emergent results were called by telephone at the time of interpretation on 10/01/2022 at 2:18 am to Dr. KPryor Curia, who verbally acknowledged these  results. Electronically Signed   By: MInez CatalinaM.D.   On: 10/01/2022 02:20     PROCEDURES:  Critical  Care performed: No      Procedures    IMPRESSION / MDM / [place] / ED COURSE  I reviewed the triage vital signs and the nursing notes.    Patient here with right lower quadrant abdominal pain.     DIFFERENTIAL DIAGNOSIS (includes but not limited to):   Appendicitis, UTI, pyelonephritis, kidney stone, diverticulitis, colitis   Patient's presentation is most consistent with acute presentation with potential threat to life or bodily function.   PLAN: Labs obtained from triage.  Patient is a leukocytosis of 16,000.  Normal LFTs, lipase.  Urine does not appear infected.  Will obtain CT of the abdomen pelvis.  Will give IV fluids, pain and nausea medicine.   MEDICATIONS GIVEN IN ED: Medications  0.9 %  sodium chloride infusion ( Intravenous New Bag/Given 10/01/22 0336)  fentaNYL (SUBLIMAZE) injection 50 mcg (50 mcg Intravenous Given 10/01/22 0336)  ondansetron (ZOFRAN) injection 4 mg (has no administration in time range)  sodium chloride 0.9 % bolus 1,000 mL (0 mLs Intravenous Stopped 10/01/22 0334)  fentaNYL (SUBLIMAZE) injection 50 mcg (50 mcg Intravenous Given 10/01/22 0135)  ondansetron (ZOFRAN) injection 4 mg (4 mg Intravenous Given 10/01/22 0135)  iohexol (OMNIPAQUE) 350 MG/ML injection 100 mL (100 mLs Intravenous Contrast Given 10/01/22 0153)  piperacillin-tazobactam (ZOSYN) IVPB 3.375 g (0 g Intravenous Stopped 10/01/22 0334)     ED COURSE: CT scan reviewed and interpreted by myself and the radiologist and shows acute uncomplicated appendicitis.  Will give Zosyn and discussed with general surgery.   CONSULTS: Discussed with Dr. Christian Mate with general surgery.  Appreciate his help.  He will admit patient for appendectomy later today.  Patient and sister updated with plan.  Will keep NPO.   OUTSIDE RECORDS REVIEWED: Reviewed patient's last internal  medicine visit with Dr. Doy Hutching on 09/14/2022.       FINAL CLINICAL IMPRESSION(S) / ED DIAGNOSES   Final diagnoses:  Acute appendicitis, unspecified acute appendicitis type     Rx / DC Orders   ED Discharge Orders     None        Note:  This document was prepared using Dragon voice recognition software and may include unintentional dictation errors.   Eduar Kumpf, Delice Bison, DO 10/01/22 [phone]

## 2022-10-01 NOTE — Op Note (Signed)
Laparascopic appendectomy   [name] Date of operation:  10/01/2022  Indications: The patient presented with a history of  abdominal pain. Workup has revealed findings consistent with acute appendicitis.  Pre-operative Diagnosis: Appendicitis, unqualified  Post-operative Diagnosis: Appendicitis with localized peritonitis.   Surgeon: Ronny Bacon, M.D., FACS  Anesthesia: General with endotracheal tube  Findings: see photo, local adhesive process with exudates, no purulence, no gross peritonitis or gangrene.   Estimated Blood Loss: 15 mL          Specimens: appendix         Complications:  none.   Procedure Details  The patient was seen again in the preop area. The options of surgery versus observation were reviewed with the patient and/or family. The risks of bleeding, infection, recurrence of symptoms, negative laparoscopy, potential for an open procedure, bowel injury, abscess or infection, were all reviewed as well. The patient was taken to Operating Room, identified as Charles Choi and the procedure verified as laparoscopic appendectomy. A Time Out was held and the above information confirmed. The patient was placed in the supine position and general anesthesia was induced.  Antibiotic prophylaxis was administered pre-op and VT E prophylaxis was in place.  After local infiltration of quarter percent Marcaine with epinephrine, stab incision was made left upper quadrant, avoiding his implant.  Just below the costal margin approximately midclavicular line the Veress needle is passed with sensation of the layers to penetrate the abdominal wall and into the peritoneum.  Saline drop test is confirmed peritoneal placement.  Insufflation is initiated with carbon dioxide to pressures of 15 mmHg.  The abdomen was prepped and draped in a sterile fashion. Local infiltration with 0.25% Marcaine with epi is administered to all incisions.  An infraumbilical incision was made.  A 5 mm optical  trocar was passed into the peritoneal cavity under direct visualization.  Pneumoperitoneum obtained. One 12 mm port in the LLQ, and another 5 mm port in the RUQ were placed under direct visualization.  The appendix was identified.  The appendix was carefully dissected from the adjacent soft tissue adhesions. The mesoappendix was divided with Harmonic scalpel. The base of the appendix was dissected out and divided with a 45 mm white load Endo GIA.The appendix was placed in a Endo Catch bag and removed via the 12 mm LLQ port. The right lower quadrant and pelvis was then irrigated with 3L normal saline which was aspirated. Inspection  failed to identify any additional bleeding and there were no signs of bowel injury. Again the right lower quadrant was inspected there was no sign of bleeding or bowel injury.  The LLQ fascia was closed with 0 Vicryl using the suture passer, pneumoperitoneum was released, all ports were removed, and the skin incisions were approximated with subcuticular 4-0 Monocryl. Dermabond was applied.  The patient tolerated the procedure well, there were no complications. The sponge lap and needle count were correct at the end of the procedure.  The patient was taken to the recovery room in stable condition to be discharged to home, probably in the morning, when appropriate.   Ronny Bacon, M.D., [facility] 10/01/2022 - 9:38 AM

## 2022-10-01 NOTE — Transfer of Care (Signed)
Immediate Anesthesia Transfer of Care Note  Patient: Charles Choi  Procedure(s) Performed: Procedure(s): APPENDECTOMY LAPAROSCOPIC (N/A)  Patient Location: PACU  Anesthesia Type:General  Level of Consciousness: sedated  Airway & Oxygen Therapy: Patient Spontanous Breathing and Patient connected to face mask oxygen  Post-op Assessment: Report given to RN and Post -op Vital signs reviewed and stable  Post vital signs: Reviewed and stable  Last Vitals:  Vitals:   10/01/22 0606 10/01/22 0940  BP: (!) 169/90 (!) 174/72  Pulse: 81 72  Resp: 18 (!) 25  Temp:  (!) 36.4 C  SpO2: 38% 17%    Complications: No apparent anesthesia complications

## 2022-10-02 ENCOUNTER — Encounter: Payer: Self-pay | Admitting: Surgery

## 2022-10-02 LAB — BASIC METABOLIC PANEL
Anion gap: 8 (ref 5–15)
BUN: 18 mg/dL (ref 8–23)
CO2: 23 mmol/L (ref 22–32)
Calcium: 8.3 mg/dL — ABNORMAL LOW (ref 8.9–10.3)
Chloride: 110 mmol/L (ref 98–111)
Creatinine, Ser: 1.39 mg/dL — ABNORMAL HIGH (ref 0.61–1.24)
GFR, Estimated: 55 mL/min — ABNORMAL LOW (ref >60)
Glucose, Bld: 134 mg/dL — ABNORMAL HIGH (ref 70–99)
Potassium: 3.9 mmol/L (ref 3.5–5.1)
Sodium: 141 mmol/L (ref 135–145)

## 2022-10-02 LAB — CBC
HCT: 35.1 % — ABNORMAL LOW (ref 39.0–52.0)
Hemoglobin: 11 g/dL — ABNORMAL LOW (ref 13.0–17.0)
MCH: 25.2 pg — ABNORMAL LOW (ref 26.0–34.0)
MCHC: 31.3 g/dL (ref 30.0–36.0)
MCV: 80.5 fL (ref 80.0–100.0)
Platelets: 170 10*3/uL (ref 150–400)
RBC: 4.36 MIL/uL (ref 4.22–5.81)
RDW: 20.3 % — ABNORMAL HIGH (ref 11.5–15.5)
WBC: 15.1 10*3/uL — ABNORMAL HIGH (ref 4.0–10.5)
nRBC: 0 % (ref 0.0–0.2)

## 2022-10-02 MED ORDER — HYDROCODONE-ACETAMINOPHEN 5-325 MG PO TABS: 1.0000 | ORAL_TABLET | Freq: Four times a day (QID) | ORAL | 0 refills | Status: DC | PRN

## 2022-10-02 MED ORDER — AMOXICILLIN-POT CLAVULANATE 875-125 MG PO TABS: 1.0000 | ORAL_TABLET | Freq: Two times a day (BID) | ORAL | 0 refills | Status: DC

## 2022-10-02 NOTE — Progress Notes (Signed)
Discharge instructions reviewed, patient verbalized understanding. No acute distress noted or verbalized. Lap sites to abdomen open to air. Patient discharged to home via wheelchair to personal vehicle.

## 2022-10-02 NOTE — Discharge Summary (Signed)
Physician Discharge Summary  Patient ID: Charles Choi MRN: [id number] DOB/AGE: 04-18-1951 71 y.o.  Admit date: 10/01/2022 Discharge date: 10/02/2022  Admission Diagnoses: Acute appendicitis  Discharge Diagnoses:  Principal Problem:   Acute appendicitis   Discharged Condition: good  Hospital Course: Following laparoscopic appendectomy, patient was admitted for advancement of diet, pain control and IV antibiotics.  All are tolerated well.  Postoperative day 1 white blood cell count still elevated though slightly decreased.  No evidence of fevers or chills.  Consults: None  Significant Diagnostic Studies: radiology: CT scan: See report for initial diagnosis of appendicitis.  Treatments: surgery: Laparoscopic appendectomy October 01, 2022.  Discharge Exam: Blood pressure (!) 140/97, pulse 73, temperature 98 F (36.7 C), temperature source Oral, resp. rate 18, height '5\' 6"'$  (1.676 m), weight 73.5 kg, SpO2 98 %. General appearance: alert, cooperative, and no distress GI: soft, non-tender; bowel sounds normal; no masses,  no organomegaly Incision/Wound: Incisions, clean dry and intact.  Disposition: Discharge disposition: 01-Home or Self Care       Discharge Instructions     Call MD for:  persistant nausea and vomiting   Complete by: As directed    Call MD for:  redness, tenderness, or signs of infection (pain, swelling, redness, odor or green/yellow discharge around incision site)   Complete by: As directed    Call MD for:  severe uncontrolled pain   Complete by: As directed    Diet - low sodium heart healthy   Complete by: As directed    Discharge instructions   Complete by: As directed    May resume aspirin or other anticoagulants after 48 hours.   Discharge wound care:   Complete by: As directed    Your incision was closed with Dermabond.  It is best to keep it clean and dry, it will tolerate a brief shower, but do not soak it or apply any creams or lotions to the  incisions.  The Dermabond should gradually flake off over time.  Keep it open to air so you can evaluate your incisions.  Dermabond assists the underlying sutures to keep your incision closed and protected from infection.  Should you develop some drainage from your incision, some drops of drainage would be okay but if it persists continue to put keep a dry dressing over it.   Driving Restrictions   Complete by: As directed    No driving until cleared after follow-up appointment.  Is not advised to drive while taking narcotic pain medications or in significant pain.   Increase activity slowly   Complete by: As directed    Lifting restrictions   Complete by: As directed    Strongly advised against any form of lifting greater than 15 pounds over the next 4 to 6 weeks.  This involves pushing/pulling movements as well.  After 4 weeks when may gradually engage in more activities remaining aware of any new pain/tenderness elicited, and avoiding those for the full duration of 6 weeks.  Walking is encouraged.  Climbing stairs with caution.      Allergies as of 10/02/2022       Reactions   Phenergan [promethazine Hcl] Other (See Comments)   Unknown    Reglan [metoclopramide] Other (See Comments)   Pass out         Medication List     TAKE these medications    acetaminophen 325 MG tablet Commonly known as: TYLENOL Take 925 mg by mouth every 6 (six) hours as needed for mild  pain or moderate pain.   amantadine 100 MG capsule Commonly known as: SYMMETREL Take 1 capsule (100 mg total) by mouth daily.   amoxicillin-clavulanate 875-125 MG tablet Commonly known as: AUGMENTIN Take 1 tablet by mouth 2 (two) times daily.   Aspirin 81 MG Caps Take 81 mg by mouth 2 (two) times daily.   atorvastatin 20 MG tablet Commonly known as: LIPITOR Take 1 tablet (20 mg total) by mouth daily.   Carbidopa-Levodopa ER 25-100 MG tablet controlled release Commonly known as: SINEMET CR Take 1 tablet by  mouth at bedtime. What changed: Another medication with the same name was changed. Make sure you understand how and when to take each.   carbidopa-levodopa 25-100 MG tablet Commonly known as: SINEMET IR Take 1 tablet orally two times a day at 7am, and 10pm. Take 0.5 tablet orally at 10am, 1pm, 4pm, and 7pm. What changed:  how much to take how to take this when to take this additional instructions   clonazePAM 1 MG tablet Commonly known as: KLONOPIN Take 2 mg by mouth at bedtime as needed.   doxycycline 100 MG capsule Commonly known as: MONODOX Take one cap po BID x 10 days. Then one tab po QD x 20 days. Take with food.   HYDROcodone-acetaminophen 5-325 MG tablet Commonly known as: NORCO/VICODIN Take 1 tablet by mouth every 6 (six) hours as needed for moderate pain.   ketoconazole 2 % cream Commonly known as: NIZORAL Apply 1 application topically daily. Apply to flaky areas daily as needed   losartan 100 MG tablet Commonly known as: COZAAR Take 1 tablet (100 mg total) by mouth daily.   Melatonin 3 MG Caps Take 1-2 tablets by mouth 2 hours before bedtime   meloxicam 7.5 MG tablet Commonly known as: MOBIC Take 1 tablet (7.5 mg total) by mouth daily.   metoprolol succinate 25 MG 24 hr tablet Commonly known as: TOPROL-XL Take 1 tablet (25 mg total) by mouth 2 (two) times daily.   metoprolol tartrate 25 MG tablet Commonly known as: LOPRESSOR TAKE 1 TABLET TWICE DAILY   mirabegron ER 25 MG Tb24 tablet Commonly known as: MYRBETRIQ Take 1 tablet (25 mg total) by mouth daily.   NON FORMULARY Diet Type: Regular   omeprazole 20 MG tablet Commonly known as: PRILOSEC OTC Take 40 mg by mouth daily.   omeprazole 40 MG capsule Commonly known as: PRILOSEC Take 1 capsule by mouth daily.   Potassium Citrate 15 MEQ (1620 MG) Tbcr Take 1 tablet by mouth 2 (two) times daily.   ranolazine 500 MG 12 hr tablet Commonly known as: RANEXA   rOPINIRole 2 MG tablet Commonly  known as: REQUIP Take 1 tablet (2 mg total) by mouth every 3 (three) hours. At 7am, and 1pm. What changed:  how much to take additional instructions   selegiline 5 MG capsule Commonly known as: ELDEPRYL Take 1 capsule (5 mg total) by mouth 2 (two) times daily. At 7am and 1pm.   tadalafil 20 MG tablet Commonly known as: CIALIS Take 1 tablet (20 mg total) by mouth daily.   testosterone cypionate 200 MG/ML injection Commonly known as: DEPOTESTOSTERONE CYPIONATE Inject 1 mL (200 mg total) into the muscle every 14 (fourteen) days.   traZODone 150 MG tablet Commonly known as: DESYREL Take 0.5 tablets (75 mg total) by mouth at bedtime as needed.   venlafaxine XR 150 MG 24 hr capsule Commonly known as: EFFEXOR-XR Take 150 mg by mouth at bedtime.  Discharge Care Instructions  (From admission, onward)           Start     Ordered   10/02/22 0000  Discharge wound care:       Comments: Your incision was closed with Dermabond.  It is best to keep it clean and dry, it will tolerate a brief shower, but do not soak it or apply any creams or lotions to the incisions.  The Dermabond should gradually flake off over time.  Keep it open to air so you can evaluate your incisions.  Dermabond assists the underlying sutures to keep your incision closed and protected from infection.  Should you develop some drainage from your incision, some drops of drainage would be okay but if it persists continue to put keep a dry dressing over it.   10/02/22 1157            Follow-up Information     Tylene Fantasia, PA-C. Schedule an appointment as soon as possible for a visit on 10/13/2022.   Specialty: Physician Assistant Contact information: [address] [place] [address] [phone]                 Signed: Ronny Bacon, M.D., [facility] 10/02/2022, 12:00 PM

## 2022-10-04 LAB — SURGICAL PATHOLOGY

## 2022-10-12 ENCOUNTER — Encounter: Payer: Self-pay | Admitting: Dermatology

## 2022-10-12 ENCOUNTER — Ambulatory Visit (INDEPENDENT_AMBULATORY_CARE_PROVIDER_SITE_OTHER): Payer: Medicare Other | Admitting: Urology

## 2022-10-12 VITALS — BP 130/70 | HR 80 | Ht 67.0 in | Wt 162.0 lb

## 2022-10-12 DIAGNOSIS — E291 Testicular hypofunction: Secondary | ICD-10-CM

## 2022-10-12 DIAGNOSIS — R972 Elevated prostate specific antigen [PSA]: Secondary | ICD-10-CM

## 2022-10-12 DIAGNOSIS — R350 Frequency of micturition: Secondary | ICD-10-CM

## 2022-10-12 DIAGNOSIS — N390 Urinary tract infection, site not specified: Secondary | ICD-10-CM

## 2022-10-13 ENCOUNTER — Encounter: Payer: Self-pay | Admitting: Urology

## 2022-10-13 ENCOUNTER — Encounter: Payer: Self-pay | Admitting: Physician Assistant

## 2022-10-13 ENCOUNTER — Other Ambulatory Visit: Payer: Self-pay

## 2022-10-13 ENCOUNTER — Ambulatory Visit (INDEPENDENT_AMBULATORY_CARE_PROVIDER_SITE_OTHER): Payer: Medicare Other | Admitting: Physician Assistant

## 2022-10-13 VITALS — BP 160/88 | HR 76 | Temp 98.1°F | Ht 67.0 in | Wt 161.0 lb

## 2022-10-13 DIAGNOSIS — Z09 Encounter for follow-up examination after completed treatment for conditions other than malignant neoplasm: Secondary | ICD-10-CM

## 2022-10-13 DIAGNOSIS — K353 Acute appendicitis with localized peritonitis, without perforation or gangrene: Secondary | ICD-10-CM

## 2022-10-13 DIAGNOSIS — K3580 Unspecified acute appendicitis: Secondary | ICD-10-CM

## 2022-10-13 LAB — URINALYSIS, COMPLETE
Bilirubin, UA: NEGATIVE
Glucose, UA: NEGATIVE
Ketones, UA: NEGATIVE
Leukocytes,UA: NEGATIVE
Nitrite, UA: NEGATIVE
Specific Gravity, UA: 1.025 (ref 1.005–1.030)
Urobilinogen, Ur: 0.2 mg/dL (ref 0.2–1.0)
pH, UA: 5.5 (ref 5.0–7.5)

## 2022-10-13 LAB — MICROSCOPIC EXAMINATION: Bacteria, UA: NONE SEEN

## 2022-10-13 NOTE — Progress Notes (Signed)
10/12/2022 8:18 PM   Charles Choi June 08, 1951 [id number]  Referring provider: Idelle Crouch, MD [facility] [place],  [place] [address]  Chief Complaint  Patient presents with   Urinary Frequency   Urologic history:  1.  Hypogonadism Diagnosed 2019; symptoms tiredness, fatigue, decreased libido History seminoma s/p orchiectomy Testosterone cypionate injections  2.  Nephrolithiasis History right ureteral calculus-passed CT 09/2022 showed no urinary tract calculi  3.  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 male called for an acute appointment for possible UTI.  Recently discharged from [facility] after appendectomy Had been on doxycycline by dermatology and was discharged on Augmentin which she recently completed After discharge.  Noted increased urgency and dysuria with cloudy urine though this has significantly improved in the last 24 hours No complaints today He had been off testosterone for several weeks and recently restarted last week   PMH: Past Medical History:  Diagnosis Date   Anxiety    Basal cell carcinoma 09/04/2017   left infrascapular mid back   Basal cell carcinoma 09/28/2015   left low back 11 cm lat to spinal scar   Basal cell carcinoma 09/28/2015   left posterior waistline 5.5 cm lateral to spinal scar   Basal cell carcinoma 07/06/2015   left medial low back   Basal cell carcinoma 07/06/2015   left low back lateral   Basal cell carcinoma 05/06/2015   right lower back   Basal cell carcinoma 09/16/2021   right medial mid chest, [facility] 12/01/21   Basal cell carcinoma 09/16/2021   left mid medial back, [facility] 12/01/21   Basal cell carcinoma 09/16/2021   right mid lower back   Basal cell carcinoma 04/06/2022   low back spinal above sacral - [facility]   Basal cell carcinoma 04/06/2022   Right post  waistline - [facility]   Cancer ([place])    Depression    Hypertension    Parkinson's disease    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    Surgical History: Past Surgical History:  Procedure Laterality Date   BACK SURGERY     CHOLECYSTECTOMY     DEEP BRAIN STIMULATOR PLACEMENT     for Parkinson's disease   JOINT REPLACEMENT     left knee x2   LAPAROSCOPIC APPENDECTOMY N/A 10/01/2022   Procedure: APPENDECTOMY LAPAROSCOPIC;  Surgeon: Ronny Bacon, MD;  Location: [facility] ORS;  Service: General;  Laterality: N/A;   ORCHIECTOMY  1983    Home Medications:  Allergies as of 10/12/2022       Reactions   Phenergan [promethazine Hcl] Other (See Comments)   Unknown    Reglan [metoclopramide] Other (See Comments)   Pass out         Medication List        Accurate as of October 12, 2022 11:59 PM. If you have any questions, ask your nurse or doctor.          acetaminophen 325 MG tablet Commonly known as: TYLENOL Take 925 mg by mouth every 6 (six) hours as needed for mild pain or moderate pain.   amantadine 100 MG capsule Commonly known as: SYMMETREL Take 1 capsule (100 mg total) by mouth daily.   amoxicillin-clavulanate 875-125 MG tablet Commonly known as: AUGMENTIN Take 1 tablet by mouth 2 (two) times daily.   Aspirin 81 MG Caps Take 81 mg by mouth 2 (  two) times daily.   atorvastatin 20 MG tablet Commonly known as: LIPITOR Take 1 tablet (20 mg total) by mouth daily.   Carbidopa-Levodopa ER 25-100 MG tablet controlled release Commonly known as: SINEMET CR Take 1 tablet by mouth at bedtime. What changed: Another medication with the same name was changed. Make sure you understand how and when to take each.   carbidopa-levodopa 25-100 MG tablet Commonly known as: SINEMET IR Take 1 tablet orally two times a day at 7am, and 10pm. Take 0.5 tablet orally at 10am, 1pm, 4pm, and 7pm. What changed:  how much to take how to take this when to take this additional  instructions   clonazePAM 1 MG tablet Commonly known as: KLONOPIN Take 2 mg by mouth at bedtime as needed.   doxycycline 100 MG capsule Commonly known as: MONODOX Take one cap po BID x 10 days. Then one tab po QD x 20 days. Take with food.   HYDROcodone-acetaminophen 5-325 MG tablet Commonly known as: NORCO/VICODIN Take 1 tablet by mouth every 6 (six) hours as needed for moderate pain.   ketoconazole 2 % cream Commonly known as: NIZORAL Apply 1 application topically daily. Apply to flaky areas daily as needed   losartan 100 MG tablet Commonly known as: COZAAR Take 1 tablet (100 mg total) by mouth daily.   Melatonin 3 MG Caps Take 1-2 tablets by mouth 2 hours before bedtime   meloxicam 7.5 MG tablet Commonly known as: MOBIC Take 1 tablet (7.5 mg total) by mouth daily.   metoprolol succinate 25 MG 24 hr tablet Commonly known as: TOPROL-XL Take 1 tablet (25 mg total) by mouth 2 (two) times daily.   metoprolol tartrate 25 MG tablet Commonly known as: LOPRESSOR TAKE 1 TABLET TWICE DAILY   mirabegron ER 25 MG Tb24 tablet Commonly known as: MYRBETRIQ Take 1 tablet (25 mg total) by mouth daily.   NON FORMULARY Diet Type: Regular   omeprazole 20 MG tablet Commonly known as: PRILOSEC OTC Take 40 mg by mouth daily.   omeprazole 40 MG capsule Commonly known as: PRILOSEC Take 1 capsule by mouth daily.   Potassium Citrate 15 MEQ (1620 MG) Tbcr Take 1 tablet by mouth 2 (two) times daily.   ranolazine 500 MG 12 hr tablet Commonly known as: RANEXA   rOPINIRole 2 MG tablet Commonly known as: REQUIP Take 1 tablet (2 mg total) by mouth every 3 (three) hours. At 7am, and 1pm. What changed:  how much to take additional instructions   selegiline 5 MG capsule Commonly known as: ELDEPRYL Take 1 capsule (5 mg total) by mouth 2 (two) times daily. At 7am and 1pm.   tadalafil 20 MG tablet Commonly known as: CIALIS Take 1 tablet (20 mg total) by mouth daily.   testosterone  cypionate 200 MG/ML injection Commonly known as: DEPOTESTOSTERONE CYPIONATE Inject 1 mL (200 mg total) into the muscle every 14 (fourteen) days.   traZODone 150 MG tablet Commonly known as: DESYREL Take 0.5 tablets (75 mg total) by mouth at bedtime as needed.   venlafaxine XR 150 MG 24 hr capsule Commonly known as: EFFEXOR-XR Take 150 mg by mouth at bedtime.        Allergies:  Allergies  Allergen Reactions   Promethazine Other (See Comments)    Ocular gyrus and light sensitivity and anxiousness. When he was going through chemo in 1984.   Metoclopramide Other (See Comments)    Pass out  Pass out. When he was going through chemo in 1984.  Phenergan [Promethazine Hcl] Other (See Comments)    Unknown     Family History: No family history on file.  Social History:  reports that he has never smoked. He has never used smokeless tobacco. He reports current alcohol use. He reports current drug use. Drug: Marijuana.   Physical Exam: BP 130/70   Pulse 80   Ht '5\' 7"'$  (1.702 m)   Wt 162 lb (73.5 kg)   BMI 25.37 kg/m   Constitutional:  Alert and oriented, No acute distress. HEENT: [place] AT Respiratory: Normal respiratory effort, no increased work of breathing. Psychiatric: Normal mood and affect.   Assessment & Plan:   Urinalysis today was clear and voiding symptoms have improved A urine culture was ordered Follow-up April 2024 for testosterone, PSA, hematocrit   Abbie Sons, [place] [address], [place], [place] [address] [phone]

## 2022-10-13 NOTE — Patient Instructions (Signed)

## 2022-10-13 NOTE — Progress Notes (Signed)
[facility] POST-OP OFFICE VISIT  10/13/2022  HPI: Charles Choi is a 71 y.o. male 12 days s/p laparoscopic appendectomy for acute appendicitis with Dr Christian Mate   He reports that he has done well since surgery No issues with pain; only needed 1 hydrocodone No fever, chills, nausea, emesis, or bowel change Tolerating PO Ambulates with walker at baseline No issues with incisions   Vital signs: BP (!) 160/88   Pulse 76   Temp 98.1 F (36.7 C) (Oral)   Ht '5\' 7"'$  (1.702 m)   Wt 161 lb (73 kg)   SpO2 96%   BMI 25.22 kg/m    Physical Exam: Constitutional: Well appearing male, NAD Abdomen: Soft, non-tender, non-distended, no rebound/guarding Skin: Laparoscopic incisions are healing well, no erythema or drainage   Assessment/Plan: This is a 70 y.o. male 12 days s/p laparoscopic appendectomy for acute appendicitis with Dr Christian Mate    - Pain control prn  - Reviewed wound care recommendation  - Reviewed lifting restrictions; 4 weeks total  - Reviewed surgical pathology; Appendicitis  - He can follow up on as needed basis; He understands to call with questions/concerns  -- Edison Simon, PA-C [facility] 10/13/2022, 3:05 PM M-F: 7am - 4pm

## 2022-10-15 ENCOUNTER — Other Ambulatory Visit: Payer: Self-pay | Admitting: Urology

## 2022-11-21 ENCOUNTER — Encounter: Payer: Medicare Other | Admitting: Dermatology

## 2022-12-12 ENCOUNTER — Ambulatory Visit (INDEPENDENT_AMBULATORY_CARE_PROVIDER_SITE_OTHER): Payer: Medicare Other | Admitting: Urology

## 2022-12-12 ENCOUNTER — Encounter: Payer: Self-pay | Admitting: Urology

## 2022-12-12 VITALS — BP 154/73 | HR 83 | Ht 67.0 in | Wt 159.0 lb

## 2022-12-12 DIAGNOSIS — R350 Frequency of micturition: Secondary | ICD-10-CM

## 2022-12-12 DIAGNOSIS — N3941 Urge incontinence: Secondary | ICD-10-CM

## 2022-12-12 DIAGNOSIS — R339 Retention of urine, unspecified: Secondary | ICD-10-CM | POA: Diagnosis not present

## 2022-12-12 LAB — MICROSCOPIC EXAMINATION

## 2022-12-12 LAB — URINALYSIS, COMPLETE
Bilirubin, UA: NEGATIVE
Glucose, UA: NEGATIVE
Ketones, UA: NEGATIVE
Leukocytes,UA: NEGATIVE
Nitrite, UA: NEGATIVE
Specific Gravity, UA: 1.025 (ref 1.005–1.030)
Urobilinogen, Ur: 0.2 mg/dL (ref 0.2–1.0)
pH, UA: 5 (ref 5.0–7.5)

## 2022-12-12 LAB — BLADDER SCAN AMB NON-IMAGING: Scan Result: 114

## 2022-12-12 MED ORDER — TAMSULOSIN HCL 0.4 MG PO CAPS: 0.4000 mg | ORAL_CAPSULE | Freq: Every day | ORAL | 3 refills | Status: DC

## 2022-12-12 MED ORDER — MIRABEGRON ER 50 MG PO TB24: 50.0000 mg | ORAL_TABLET | Freq: Every day | ORAL | 0 refills | Status: DC

## 2022-12-14 ENCOUNTER — Encounter: Payer: Self-pay | Admitting: Urology

## 2022-12-14 NOTE — Progress Notes (Signed)
12/12/2022 7:31 AM   Charles Choi May 07, 1951 [id number]  Referring provider: Idelle Crouch, MD [facility] [place],   [address]  Chief Complaint  Patient presents with   Urinary Frequency   Urologic history:  1.  Hypogonadism Diagnosed 2019; symptoms tiredness, fatigue, decreased libido History seminoma s/p orchiectomy Testosterone cypionate injections  2.  Nephrolithiasis History right ureteral calculus-passed CT 09/2022 showed no urinary tract calculi  3.  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 male called for an acute appointment for worsening lower urinary tract symptoms.  No longer taking Myrbetriq due to cost.  Did see improvement on the 25 mg dose Notes increased frequency, urgency with urge incontinence.  He does have some urinary hesitancy IPSS is 27/35 with most bothersome symptoms frequency and urgency Was seen December 2023 with a UTI   PMH: Past Medical History:  Diagnosis Date   Anxiety    Basal cell carcinoma 09/04/2017   left infrascapular mid back   Basal cell carcinoma 09/28/2015   left low back 11 cm lat to spinal scar   Basal cell carcinoma 09/28/2015   left posterior waistline 5.5 cm lateral to spinal scar   Basal cell carcinoma 07/06/2015   left medial low back   Basal cell carcinoma 07/06/2015   left low back lateral   Basal cell carcinoma 05/06/2015   right lower back   Basal cell carcinoma 09/16/2021   right medial mid chest, [facility] 12/01/21   Basal cell carcinoma 09/16/2021   left mid medial back, [facility] 12/01/21   Basal cell carcinoma 09/16/2021   right mid lower back   Basal cell carcinoma 04/06/2022   low back spinal above sacral - [facility]   Basal cell carcinoma 04/06/2022   Right post waistline - [facility]   Cancer ([place])    Depression    Hypertension    Parkinson's  disease    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 When he was going through chemo in 1984.   Metoclopramide Other (See Comments)   Pass out Pass out. When he was going through chemo in 1984.   Phenergan [promethazine Hcl] Other (See Comments)   Unknown         Medication List        Accurate as of December 12, 2022 11:59 PM. If you have any questions, ask your nurse or doctor.          STOP taking these medications    amantadine 100 MG capsule Commonly known as: SYMMETREL Stopped by: Abbie Sons, MD   amoxicillin-clavulanate 875-125 MG tablet Commonly known as: AUGMENTIN Stopped by: Abbie Sons, MD   doxycycline 100 MG capsule Commonly known as: MONODOX Stopped by: Abbie Sons,  MD       TAKE these medications    acetaminophen 325 MG tablet Commonly known as: TYLENOL Take 925 mg by mouth every 6 (six) hours as needed for mild pain or moderate pain.   Aspirin 81 MG Caps Take 81 mg by mouth 2 (two) times daily.   atorvastatin 20 MG tablet Commonly known as: LIPITOR Take 1 tablet (20 mg total) by mouth daily.   Carbidopa-Levodopa ER 25-100 MG tablet controlled release Commonly known as: SINEMET CR Take 1 tablet by mouth at bedtime. What changed: Another medication with the same name was changed. Make sure you understand how and when to take each.   carbidopa-levodopa 25-100 MG  tablet Commonly known as: SINEMET IR Take 1 tablet orally two times a day at 7am, and 10pm. Take 0.5 tablet orally at 10am, 1pm, 4pm, and 7pm. What changed:  how much to take how to take this when to take this additional instructions   clonazePAM 1 MG tablet Commonly known as: KLONOPIN Take 2 mg by mouth at bedtime as needed.   ketoconazole 2 % cream Commonly known as: NIZORAL Apply 1 application topically daily. Apply to flaky areas daily as needed   losartan 100 MG tablet Commonly known as: COZAAR Take 1 tablet (100 mg total) by mouth daily.   Melatonin 3 MG Caps Take 1-2 tablets by mouth 2 hours before bedtime   meloxicam 7.5 MG tablet Commonly known as: MOBIC Take 1 tablet (7.5 mg total) by mouth daily.   metoprolol succinate 25 MG 24 hr tablet Commonly known as: TOPROL-XL Take 1 tablet (25 mg total) by mouth 2 (two) times daily.   metoprolol tartrate 25 MG tablet Commonly known as: LOPRESSOR TAKE 1 TABLET TWICE DAILY   mirabegron ER 50 MG Tb24 tablet Commonly known as: MYRBETRIQ Take 1 tablet (50 mg total) by mouth daily. What changed:  medication strength how much to take Changed by: Abbie Sons, MD   NON FORMULARY Diet Type: Regular   omeprazole 20 MG tablet Commonly known as: PRILOSEC OTC Take 40 mg by mouth daily.   omeprazole 40 MG capsule Commonly known as: PRILOSEC Take 1 capsule by mouth daily.   Potassium Citrate 15 MEQ (1620 MG) Tbcr Take 1 tablet by mouth 2 (two) times daily.   ranolazine 500 MG 12 hr tablet Commonly known as: RANEXA   rOPINIRole 2 MG tablet Commonly known as: REQUIP Take 1 tablet (2 mg total) by mouth every 3 (three) hours. At 7am, and 1pm. What changed:  how much to take additional instructions   selegiline 5 MG capsule Commonly known as: ELDEPRYL Take 1 capsule (5 mg total) by mouth 2 (two) times daily. At 7am and 1pm.   tadalafil 20 MG tablet Commonly known as: CIALIS TAKE ONE TABLET BY MOUTH EVERY  DAY   tamsulosin 0.4 MG Caps capsule Commonly known as: FLOMAX Take 1 capsule (0.4 mg total) by mouth daily. Started by: Abbie Sons, MD   testosterone cypionate 200 MG/ML injection Commonly known as: DEPOTESTOSTERONE CYPIONATE Inject 1 mL (200 mg total) into the muscle every 14 (fourteen) days.   traZODone 150 MG tablet Commonly known as: DESYREL Take 0.5 tablets (75 mg total) by mouth at bedtime as needed.   venlafaxine XR 150 MG 24 hr capsule Commonly known as: EFFEXOR-XR Take 150 mg by mouth at bedtime.        Allergies:  Allergies  Allergen Reactions   Promethazine Other (See Comments)    Ocular  gyrus and light sensitivity and anxiousness. When he was going through chemo in 1984.   Metoclopramide Other (See Comments)    Pass out  Pass out. When he was going through chemo in 1984.   Phenergan [Promethazine Hcl] Other (See Comments)    Unknown     Family History: History reviewed. No pertinent family history.  Social History:  reports that he has never smoked. He has never used smokeless tobacco. He reports current alcohol use. He reports current drug use. Drug: Marijuana.   Physical Exam: BP (!) 154/73   Pulse 83   Ht '5\' 7"'$  (1.702 m)   Wt 159 lb (72.1 kg)   BMI 24.90 kg/m   Constitutional:  Alert and oriented, No acute distress. HEENT: [place] AT Respiratory: Normal respiratory effort, no increased work of breathing. Psychiatric: Normal mood and affect.   Assessment & Plan:    1.  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

## 2022-12-21 ENCOUNTER — Ambulatory Visit: Payer: Medicare Other | Admitting: Dermatology

## 2022-12-26 ENCOUNTER — Other Ambulatory Visit: Payer: Self-pay | Admitting: Internal Medicine

## 2022-12-26 DIAGNOSIS — R531 Weakness: Secondary | ICD-10-CM

## 2023-01-02 ENCOUNTER — Telehealth: Payer: Self-pay | Admitting: Urology

## 2023-01-02 NOTE — Telephone Encounter (Signed)
Pt tried samples of Myrbetriq 50 mg and said they work well.  He would like a new RX sent to [facility].

## 2023-01-03 MED ORDER — MIRABEGRON ER 50 MG PO TB24: 50.0000 mg | ORAL_TABLET | Freq: Every day | ORAL | 3 refills | Status: DC

## 2023-01-03 NOTE — Telephone Encounter (Signed)
RX called in to [facility]. Patient advised.

## 2023-01-04 ENCOUNTER — Ambulatory Visit
Admission: RE | Admit: 2023-01-04 | Discharge: 2023-01-04 | Disposition: A | Discharge status: Home / Self Care | Payer: Medicare Other | Source: Ambulatory Visit | Attending: Internal Medicine | Admitting: Internal Medicine

## 2023-01-04 ENCOUNTER — Ambulatory Visit (INDEPENDENT_AMBULATORY_CARE_PROVIDER_SITE_OTHER): Payer: Medicare Other | Admitting: Dermatology

## 2023-01-04 ENCOUNTER — Ambulatory Visit: Payer: Medicare Other

## 2023-01-04 VITALS — BP 154/72 | HR 72

## 2023-01-04 DIAGNOSIS — L820 Inflamed seborrheic keratosis: Secondary | ICD-10-CM | POA: Diagnosis not present

## 2023-01-04 DIAGNOSIS — Z85828 Personal history of other malignant neoplasm of skin: Secondary | ICD-10-CM

## 2023-01-04 DIAGNOSIS — D229 Melanocytic nevi, unspecified: Secondary | ICD-10-CM

## 2023-01-04 DIAGNOSIS — D1801 Hemangioma of skin and subcutaneous tissue: Secondary | ICD-10-CM

## 2023-01-04 DIAGNOSIS — L578 Other skin changes due to chronic exposure to nonionizing radiation: Secondary | ICD-10-CM | POA: Diagnosis not present

## 2023-01-04 DIAGNOSIS — L814 Other melanin hyperpigmentation: Secondary | ICD-10-CM | POA: Diagnosis not present

## 2023-01-04 DIAGNOSIS — R531 Weakness: Secondary | ICD-10-CM | POA: Diagnosis present

## 2023-01-04 DIAGNOSIS — Z1283 Encounter for screening for malignant neoplasm of skin: Secondary | ICD-10-CM | POA: Diagnosis not present

## 2023-01-04 DIAGNOSIS — L720 Epidermal cyst: Secondary | ICD-10-CM

## 2023-01-04 DIAGNOSIS — L821 Other seborrheic keratosis: Secondary | ICD-10-CM

## 2023-01-04 NOTE — Patient Instructions (Signed)
Due to recent changes in healthcare laws, you may see results of your pathology and/or laboratory studies on MyChart before the doctors have had a chance to review them. We understand that in some cases there may be results that are confusing or concerning to you. Please understand that not all results are received at the same time and often the doctors may need to interpret multiple results in order to provide you with the best plan of care or course of treatment. Therefore, we ask that you please give us 2 business days to thoroughly review all your results before contacting the office for clarification. Should we see a critical lab result, you will be contacted sooner.   If You Need Anything After Your Visit  If you have any questions or concerns for your doctor, please call our main line at [phone] and press option 4 to reach your doctor's medical assistant. If no one answers, please leave a voicemail as directed and we will return your call as soon as possible. Messages left after 4 pm will be answered the following business day.   You may also send us a message via MyChart. We typically respond to MyChart messages within 1-2 business days.  For prescription refills, please ask your pharmacy to contact our office. Our fax number is [phone].  If you have an urgent issue when the clinic is closed that cannot wait until the next business day, you can page your doctor at the number below.    Please note that while we do our best to be available for urgent issues outside of office hours, we are not available 24/7.   If you have an urgent issue and are unable to reach us, you may choose to seek medical care at your doctor's office, retail clinic, urgent care center, or emergency room.  If you have a medical emergency, please immediately call 911 or go to the emergency department.  Pager Numbers  - Dr. Kowalski: [phone]  - Dr. Moye: [phone]  - Dr. Stewart:  [phone]  In the event of inclement weather, please call our main line at [phone] for an update on the status of any delays or closures.  Dermatology Medication Tips: Please keep the boxes that topical medications come in in order to help keep track of the instructions about where and how to use these. Pharmacies typically print the medication instructions only on the boxes and not directly on the medication tubes.   If your medication is too expensive, please contact our office at [phone] option 4 or send us a message through MyChart.   We are unable to tell what your co-pay for medications will be in advance as this is different depending on your insurance coverage. However, we may be able to find a substitute medication at lower cost or fill out paperwork to get insurance to cover a needed medication.   If a prior authorization is required to get your medication covered by your insurance company, please allow us 1-2 business days to complete this process.  Drug prices often vary depending on where the prescription is filled and some pharmacies may offer cheaper prices.  The website www.goodrx.com contains coupons for medications through different pharmacies. The prices here do not account for what the cost may be with help from insurance (it may be cheaper with your insurance), but the website can give you the price if you did not use any insurance.  - You can print the associated coupon and take it with   your prescription to the pharmacy.  - You may also stop by our office during regular business hours and pick up a GoodRx coupon card.  - If you need your prescription sent electronically to a different pharmacy, notify our office through [place] MyChart or by phone at [phone] option 4.     Si Usted Necesita Algo Despus de Su Visita  Tambin puede enviarnos un mensaje a travs de MyChart. Por lo general respondemos a los mensajes de MyChart en el transcurso de 1 a 2  das hbiles.  Para renovar recetas, por favor pida a su farmacia que se ponga en contacto con nuestra oficina. Nuestro nmero de fax es el [phone].  Si tiene un asunto urgente cuando la clnica est cerrada y que no puede esperar hasta el siguiente da hbil, puede llamar/localizar a su doctor(a) al nmero que aparece a continuacin.   Por favor, tenga en cuenta que aunque hacemos todo lo posible para estar disponibles para asuntos urgentes fuera del horario de oficina, no estamos disponibles las 24 horas del da, los 7 das de la semana.   Si tiene un problema urgente y no puede comunicarse con nosotros, puede optar por buscar atencin mdica  en el consultorio de su doctor(a), en una clnica privada, en un centro de atencin urgente o en una sala de emergencias.  Si tiene una emergencia mdica, por favor llame inmediatamente al 911 o vaya a la sala de emergencias.  Nmeros de bper  - Dr. Kowalski: [phone]  - Dra. Moye: [phone]  - Dra. Stewart: [phone]  En caso de inclemencias del tiempo, por favor llame a nuestra lnea principal al [phone] para una actualizacin sobre el estado de cualquier retraso o cierre.  Consejos para la medicacin en dermatologa: Por favor, guarde las cajas en las que vienen los medicamentos de uso tpico para ayudarle a seguir las instrucciones sobre dnde y cmo usarlos. Las farmacias generalmente imprimen las instrucciones del medicamento slo en las cajas y no directamente en los tubos del medicamento.   Si su medicamento es muy caro, por favor, pngase en contacto con nuestra oficina llamando al [phone] y presione la opcin 4 o envenos un mensaje a travs de MyChart.   No podemos decirle cul ser su copago por los medicamentos por adelantado ya que esto es diferente dependiendo de la cobertura de su seguro. Sin embargo, es posible que podamos encontrar un medicamento sustituto a menor costo o llenar un formulario para que el  seguro cubra el medicamento que se considera necesario.   Si se requiere una autorizacin previa para que su compaa de seguros cubra su medicamento, por favor permtanos de 1 a 2 das hbiles para completar este proceso.  Los precios de los medicamentos varan con frecuencia dependiendo del lugar de dnde se surte la receta y alguna farmacias pueden ofrecer precios ms baratos.  El sitio web www.goodrx.com tiene cupones para medicamentos de diferentes farmacias. Los precios aqu no tienen en cuenta lo que podra costar con la ayuda del seguro (puede ser ms barato con su seguro), pero el sitio web puede darle el precio si no utiliz ningn seguro.  - Puede imprimir el cupn correspondiente y llevarlo con su receta a la farmacia.  - Tambin puede pasar por nuestra oficina durante el horario de atencin regular y recoger una tarjeta de cupones de GoodRx.  - Si necesita que su receta se enve electrnicamente a una farmacia diferente, informe a nuestra oficina a travs de MyChart de [place]   o por telfono llamando al [phone] y presione la opcin 4.  

## 2023-01-04 NOTE — Progress Notes (Signed)
Follow-Up Visit   Subjective  Charles Choi is a 72 y.o. male who presents for the following: Follow-up (3 months on a ruptured cyst on the left back, treated with I&D 09/26/22 ). The patient presents for Upper Body Skin Exam (UBSE) for skin cancer screening and mole check.  The patient has spots, moles and lesions to be evaluated, some may be new or changing and the patient has concerns that these could be cancer. Hx of BCC  The following portions of the chart were reviewed this encounter and updated as appropriate:   Tobacco  Allergies  Meds  Problems  Med Hx  Surg Hx  Fam Hx     Review of Systems:  No other skin or systemic complaints except as noted in HPI or Assessment and Plan.  Objective  Well appearing patient in no apparent distress; mood and affect are within normal limits.  A focused examination was performed including left back. Relevant physical exam findings are noted in the Assessment and Plan.  left mid back lateral 1.5 cm scar   right cheek Stuck-on, waxy, tan-brown papule -- Discussed benign etiology and prognosis.    Assessment & Plan  Epidermal cyst left mid back lateral Hx of ruputured cyst with Abscess  treated with I&D 09/26/2022, possible scar tissue vs recurrent cyst \ observe Check next visit  Inflamed seborrheic keratosis right cheek Symptomatic, irritating, patient would like treated.  Destruction of lesion - right cheek Complexity: simple   Destruction method: cryotherapy   Informed consent: discussed and consent obtained   Timeout:  patient name, date of birth, surgical site, and procedure verified Lesion destroyed using liquid nitrogen: Yes   Region frozen until ice ball extended beyond lesion: Yes   Outcome: patient tolerated procedure well with no complications   Post-procedure details: wound care instructions given    Lentigines - Scattered tan macules - Due to sun exposure - Benign-appearing, observe - Recommend daily  broad spectrum sunscreen SPF 30+ to sun-exposed areas, reapply every 2 hours as needed. - Call for any changes  Seborrheic Keratoses - Stuck-on, waxy, tan-brown papules and/or plaques  - Benign-appearing - Discussed benign etiology and prognosis. - Observe - Call for any changes  Melanocytic Nevi - Tan-brown and/or pink-flesh-colored symmetric macules and papules - Benign appearing on exam today - Observation - Call clinic for new or changing moles - Recommend daily use of broad spectrum spf 30+ sunscreen to sun-exposed areas.   Hemangiomas - Red papules - Discussed benign nature - Observe - Call for any changes  Actinic Damage - Chronic condition, secondary to cumulative UV/sun exposure - diffuse scaly erythematous macules with underlying dyspigmentation - Recommend daily broad spectrum sunscreen SPF 30+ to sun-exposed areas, reapply every 2 hours as needed.  - Staying in the shade or wearing long sleeves, sun glasses (UVA+UVB protection) and wide brim hats (4-inch brim around the entire circumference of the hat) are also recommended for sun protection.  - Call for new or changing lesions.  History of Basal Cell Carcinoma of the Skin Multiple see history  - No evidence of recurrence today - Recommend regular full body skin exams - Recommend daily broad spectrum sunscreen SPF 30+ to sun-exposed areas, reapply every 2 hours as needed.  - Call if any new or changing lesions are noted between office visits  Skin cancer screening performed today.   Return in about 6 months (around 07/05/2023) for TBSE, recheck cyst .  I, Marye Round, CMA, am acting as  scribe for Sarina Ser, MD .  Documentation: I have reviewed the above documentation for accuracy and completeness, and I agree with the above.  Sarina Ser, MD

## 2023-01-05 ENCOUNTER — Other Ambulatory Visit: Payer: Self-pay

## 2023-01-05 ENCOUNTER — Emergency Department: Payer: Medicare Other

## 2023-01-05 ENCOUNTER — Observation Stay
Admission: EM | Admit: 2023-01-05 | Discharge: 2023-01-07 | Disposition: A | Discharge status: Home / Self Care | Payer: Medicare Other | Attending: Hospitalist | Admitting: Hospitalist

## 2023-01-05 DIAGNOSIS — Z7982 Long term (current) use of aspirin: Secondary | ICD-10-CM | POA: Insufficient documentation

## 2023-01-05 DIAGNOSIS — R509 Fever, unspecified: Secondary | ICD-10-CM | POA: Insufficient documentation

## 2023-01-05 DIAGNOSIS — R7989 Other specified abnormal findings of blood chemistry: Secondary | ICD-10-CM | POA: Diagnosis not present

## 2023-01-05 DIAGNOSIS — N189 Chronic kidney disease, unspecified: Secondary | ICD-10-CM | POA: Diagnosis not present

## 2023-01-05 DIAGNOSIS — Z1152 Encounter for screening for COVID-19: Secondary | ICD-10-CM | POA: Diagnosis not present

## 2023-01-05 DIAGNOSIS — Z79899 Other long term (current) drug therapy: Secondary | ICD-10-CM | POA: Insufficient documentation

## 2023-01-05 DIAGNOSIS — R059 Cough, unspecified: Secondary | ICD-10-CM | POA: Insufficient documentation

## 2023-01-05 DIAGNOSIS — R531 Weakness: Secondary | ICD-10-CM

## 2023-01-05 DIAGNOSIS — G20A1 Parkinson's disease without dyskinesia, without mention of fluctuations: Secondary | ICD-10-CM | POA: Diagnosis not present

## 2023-01-05 DIAGNOSIS — Z96652 Presence of left artificial knee joint: Secondary | ICD-10-CM | POA: Insufficient documentation

## 2023-01-05 DIAGNOSIS — R079 Chest pain, unspecified: Secondary | ICD-10-CM | POA: Diagnosis not present

## 2023-01-05 DIAGNOSIS — R0789 Other chest pain: Principal | ICD-10-CM | POA: Insufficient documentation

## 2023-01-05 DIAGNOSIS — Z85828 Personal history of other malignant neoplasm of skin: Secondary | ICD-10-CM | POA: Diagnosis not present

## 2023-01-05 DIAGNOSIS — I129 Hypertensive chronic kidney disease with stage 1 through stage 4 chronic kidney disease, or unspecified chronic kidney disease: Secondary | ICD-10-CM | POA: Insufficient documentation

## 2023-01-05 LAB — URINALYSIS, ROUTINE W REFLEX MICROSCOPIC
Bacteria, UA: NONE SEEN
Bilirubin Urine: NEGATIVE
Glucose, UA: NEGATIVE mg/dL
Ketones, ur: NEGATIVE mg/dL
Nitrite: NEGATIVE
Protein, ur: 100 mg/dL — AB
Specific Gravity, Urine: 1.016 (ref 1.005–1.030)
Squamous Epithelial / HPF: NONE SEEN /HPF (ref 0–5)
pH: 5 (ref 5.0–8.0)

## 2023-01-05 LAB — RESP PANEL BY RT-PCR (RSV, FLU A&B, COVID)  RVPGX2
Influenza A by PCR: NEGATIVE
Influenza B by PCR: NEGATIVE
Resp Syncytial Virus by PCR: NEGATIVE
SARS Coronavirus 2 by RT PCR: NEGATIVE

## 2023-01-05 LAB — CBC
HCT: 45.5 % (ref 39.0–52.0)
Hemoglobin: 13.7 g/dL (ref 13.0–17.0)
MCH: 24.6 pg — ABNORMAL LOW (ref 26.0–34.0)
MCHC: 30.1 g/dL (ref 30.0–36.0)
MCV: 81.7 fL (ref 80.0–100.0)
Platelets: 219 10*3/uL (ref 150–400)
RBC: 5.57 MIL/uL (ref 4.22–5.81)
RDW: 16.7 % — ABNORMAL HIGH (ref 11.5–15.5)
WBC: 13.5 10*3/uL — ABNORMAL HIGH (ref 4.0–10.5)
nRBC: 0 % (ref 0.0–0.2)

## 2023-01-05 LAB — BASIC METABOLIC PANEL
Anion gap: 11 (ref 5–15)
BUN: 38 mg/dL — ABNORMAL HIGH (ref 8–23)
CO2: 24 mmol/L (ref 22–32)
Calcium: 9.4 mg/dL (ref 8.9–10.3)
Chloride: 105 mmol/L (ref 98–111)
Creatinine, Ser: 1.27 mg/dL — ABNORMAL HIGH (ref 0.61–1.24)
GFR, Estimated: >60 mL/min (ref >60)
Glucose, Bld: 105 mg/dL — ABNORMAL HIGH (ref 70–99)
Potassium: 3.8 mmol/L (ref 3.5–5.1)
Sodium: 140 mmol/L (ref 135–145)

## 2023-01-05 LAB — TROPONIN I (HIGH SENSITIVITY)
Troponin I (High Sensitivity): 33 ng/L — ABNORMAL HIGH (ref <18)
Troponin I (High Sensitivity): 57 ng/L — ABNORMAL HIGH (ref <18)

## 2023-01-05 MED ORDER — ROPINIROLE HCL 0.25 MG PO TABS
0.5000 mg | ORAL_TABLET | Freq: Once | ORAL | Status: AC
  Administered 2023-01-05: 0.5 mg via ORAL
  Filled 2023-01-05: qty 2

## 2023-01-05 MED ORDER — VENLAFAXINE HCL ER 150 MG PO CP24: 150.0000 mg | ORAL_CAPSULE | Freq: Every day | ORAL | Status: DC

## 2023-01-05 MED ORDER — CLONAZEPAM 1 MG PO TABS
2.0000 mg | ORAL_TABLET | Freq: Every day | ORAL | Status: DC | PRN
  Administered 2023-01-05 – 2023-01-06 (×2): 2 mg via ORAL
  Filled 2023-01-05 (×2): qty 2

## 2023-01-05 MED ORDER — ASPIRIN 81 MG PO TBEC
81.0000 mg | DELAYED_RELEASE_TABLET | Freq: Two times a day (BID) | ORAL | Status: DC
  Administered 2023-01-06 – 2023-01-07 (×3): 81 mg via ORAL
  Filled 2023-01-05 (×4): qty 1

## 2023-01-05 MED ORDER — PANTOPRAZOLE SODIUM 40 MG PO TBEC
40.0000 mg | DELAYED_RELEASE_TABLET | Freq: Every day | ORAL | Status: DC
  Administered 2023-01-06 – 2023-01-07 (×2): 40 mg via ORAL
  Filled 2023-01-05 (×2): qty 1

## 2023-01-05 MED ORDER — METOPROLOL TARTRATE 25 MG PO TABS
25.0000 mg | ORAL_TABLET | Freq: Two times a day (BID) | ORAL | Status: DC
  Administered 2023-01-05 – 2023-01-07 (×4): 25 mg via ORAL
  Filled 2023-01-05 (×4): qty 1

## 2023-01-05 MED ORDER — ACETAMINOPHEN 500 MG PO TABS
1000.0000 mg | ORAL_TABLET | Freq: Three times a day (TID) | ORAL | Status: DC | PRN
  Administered 2023-01-07: 1000 mg via ORAL
  Filled 2023-01-05: qty 2

## 2023-01-05 MED ORDER — ONDANSETRON HCL 4 MG/2ML IJ SOLN: 4.0000 mg | Freq: Four times a day (QID) | INTRAMUSCULAR | Status: DC | PRN

## 2023-01-05 MED ORDER — MIRABEGRON ER 50 MG PO TB24
50.0000 mg | ORAL_TABLET | Freq: Every day | ORAL | Status: DC
  Administered 2023-01-06 – 2023-01-07 (×2): 50 mg via ORAL
  Filled 2023-01-05 (×2): qty 1

## 2023-01-05 MED ORDER — ASPIRIN 81 MG PO CHEW
324.0000 mg | CHEWABLE_TABLET | Freq: Once | ORAL | Status: AC
  Administered 2023-01-05: 324 mg via ORAL
  Filled 2023-01-05: qty 4

## 2023-01-05 MED ORDER — ONDANSETRON 4 MG PO TBDP: 4.0000 mg | ORAL_TABLET | Freq: Three times a day (TID) | ORAL | Status: DC | PRN

## 2023-01-05 MED ORDER — LACTATED RINGERS IV BOLUS
1000.0000 mL | Freq: Once | INTRAVENOUS | Status: AC
  Administered 2023-01-05: 1000 mL via INTRAVENOUS

## 2023-01-05 MED ORDER — DOCUSATE SODIUM 100 MG PO CAPS: 100.0000 mg | ORAL_CAPSULE | Freq: Two times a day (BID) | ORAL | Status: DC | PRN

## 2023-01-05 MED ORDER — ROPINIROLE HCL 0.25 MG PO TABS: 0.5000 mg | ORAL_TABLET | ORAL | Status: DC

## 2023-01-05 MED ORDER — CALCIUM CARBONATE ANTACID 500 MG PO CHEW: 1.0000 | CHEWABLE_TABLET | Freq: Three times a day (TID) | ORAL | Status: DC | PRN

## 2023-01-05 MED ORDER — ENOXAPARIN SODIUM 40 MG/0.4ML IJ SOSY
40.0000 mg | PREFILLED_SYRINGE | INTRAMUSCULAR | Status: DC
  Administered 2023-01-06: 40 mg via SUBCUTANEOUS
  Filled 2023-01-05: qty 0.4

## 2023-01-05 MED ORDER — TRAZODONE HCL 50 MG PO TABS: 75.0000 mg | ORAL_TABLET | Freq: Every evening | ORAL | Status: DC | PRN

## 2023-01-05 MED ORDER — POLYETHYLENE GLYCOL 3350 17 G PO PACK: 17.0000 g | PACK | Freq: Two times a day (BID) | ORAL | Status: DC | PRN

## 2023-01-05 MED ORDER — CARBIDOPA-LEVODOPA 25-100 MG PO TABS
1.5000 | ORAL_TABLET | Freq: Every day | ORAL | Status: DC
  Administered 2023-01-06 – 2023-01-07 (×2): 1.5 via ORAL
  Filled 2023-01-05 (×2): qty 2

## 2023-01-05 MED ORDER — ALUM & MAG HYDROXIDE-SIMETH 200-200-20 MG/5ML PO SUSP: 15.0000 mL | Freq: Four times a day (QID) | ORAL | Status: DC | PRN

## 2023-01-05 MED ORDER — TAMSULOSIN HCL 0.4 MG PO CAPS
0.4000 mg | ORAL_CAPSULE | Freq: Every day | ORAL | Status: DC
  Administered 2023-01-06 – 2023-01-07 (×2): 0.4 mg via ORAL
  Filled 2023-01-05 (×2): qty 1

## 2023-01-05 MED ORDER — CARBIDOPA-LEVODOPA 25-100 MG PO TABS
1.0000 | ORAL_TABLET | Freq: Four times a day (QID) | ORAL | Status: DC
  Administered 2023-01-05 – 2023-01-07 (×8): 1 via ORAL
  Filled 2023-01-05 (×8): qty 1

## 2023-01-05 MED ORDER — MELATONIN 5 MG PO TABS
5.0000 mg | ORAL_TABLET | Freq: Every evening | ORAL | Status: DC | PRN
  Administered 2023-01-05 – 2023-01-06 (×2): 5 mg via ORAL
  Filled 2023-01-05 (×2): qty 1

## 2023-01-05 MED ORDER — CARBIDOPA-LEVODOPA 25-100 MG PO TABS
1.0000 | ORAL_TABLET | Freq: Once | ORAL | Status: AC
  Administered 2023-01-05: 1 via ORAL
  Filled 2023-01-05: qty 1

## 2023-01-05 NOTE — ED Provider Notes (Signed)
[facility] Provider Note    Event Date/Time   First MD Initiated Contact with Patient 01/05/23 1509     (approximate)   History   Chief Complaint Chest Pain   HPI  OLI NAKAMA is a 72 y.o. male with past medical history of hypertension, hyperlipidemia, CKD, and Parkinson disease who presents to the ED complaining of chest pain.  Patient reports that he has been feeling generally weak for about the past 3 days.  He states that he is having difficulty getting around with his walker due to the progressive weakness, states he feels especially weak in his legs and shoulders.  He denies any unilateral weakness and has not had any vision changes, speech changes, or numbness.  He does state that he had some pain in his chest earlier today while attempting to walk.  He describes it as a sharp pain that resolved after a few seconds, denies any pain currently.  He has not had any shortness of breath, but does endorse recent cough with subjective fevers.  He has had decreased oral intake but denies any nausea, vomiting, or diarrhea.  He also denies any dysuria, hematuria, or flank pain.     Physical Exam   Triage Vital Signs: ED Triage Vitals [01/05/23 1345]  Enc Vitals Group     BP 120/70     Pulse Rate 80     Resp 17     Temp 98.4 F (36.9 C)     Temp Source Oral     SpO2 99 %     Weight 158 lb (71.7 kg)     Height      Head Circumference      Peak Flow      Pain Score 0     Pain Loc      Pain Edu?      Excl. in [place]?     Most recent vital signs: Vitals:   01/05/23 1600 01/05/23 1700  BP:    Pulse: 90 (!) 102  Resp: 16   Temp:    SpO2: 99% 97%    Constitutional: Alert and oriented. Eyes: Conjunctivae are normal. Head: Atraumatic. Nose: No congestion/rhinnorhea. Mouth/Throat: Mucous membranes are moist.  Cardiovascular: Normal rate, regular rhythm. Grossly normal heart sounds.  2+ radial pulses bilaterally. Respiratory: Normal respiratory  effort.  No retractions. Lungs CTAB. Gastrointestinal: Soft and nontender. No distention. Musculoskeletal: No lower extremity tenderness nor edema.  Neurologic:  Normal speech and language. No gross focal neurologic deficits are appreciated.  Resting tremor noted.    ED Results / Procedures / Treatments   Labs (all labs ordered are listed, but only abnormal results are displayed) Labs Reviewed  BASIC METABOLIC PANEL - Abnormal; Notable for the following components:      Result Value   Glucose, Bld 105 (*)    BUN 38 (*)    Creatinine, Ser 1.27 (*)    All other components within normal limits  CBC - Abnormal; Notable for the following components:   WBC 13.5 (*)    MCH 24.6 (*)    RDW 16.7 (*)    All other components within normal limits  URINALYSIS, ROUTINE W REFLEX MICROSCOPIC - Abnormal; Notable for the following components:   Color, Urine YELLOW (*)    APPearance HAZY (*)    Hgb urine dipstick SMALL (*)    Protein, ur 100 (*)    Leukocytes,Ua TRACE (*)    All other components within normal limits  TROPONIN I (HIGH SENSITIVITY) - Abnormal; Notable for the following components:   Troponin I (High Sensitivity) 33 (*)    All other components within normal limits  TROPONIN I (HIGH SENSITIVITY) - Abnormal; Notable for the following components:   Troponin I (High Sensitivity) 57 (*)    All other components within normal limits  RESP PANEL BY RT-PCR (RSV, FLU A&B, COVID)  [id number]     EKG  ED ECG REPORT I, Blake Divine, the attending physician, personally viewed and interpreted this ECG.   Date: 01/05/2023  EKG Time: 13:42  Rate: 89  Rhythm: normal sinus rhythm  Axis: Normal  Intervals:none  ST&T Change: Lateral T wave inversions, interpretation limited due to tremor  RADIOLOGY Chest x-ray reviewed and interpreted by me with no infiltrate, edema, or effusion.  PROCEDURES:  Critical Care performed: No  Procedures   MEDICATIONS ORDERED IN ED: Medications   aspirin chewable tablet 324 mg (has no administration in time range)  carbidopa-levodopa (SINEMET IR) 25-100 MG per tablet immediate release 1 tablet (has no administration in time range)  rOPINIRole (REQUIP) tablet 0.5 mg (has no administration in time range)  lactated ringers bolus 1,000 mL (1,000 mLs Intravenous New Bag/Given 01/05/23 1605)     IMPRESSION / MDM / ASSESSMENT AND PLAN / ED COURSE  I reviewed the triage vital signs and the nursing notes.                              72 y.o. male with past medical history of hypertension, hyperlipidemia, CKD, and Parkinson disease who presents to the ED complaining of increased generalized weakness over the past couple of days with episode of chest pain earlier today.  Patient's presentation is most consistent with acute presentation with potential threat to life or bodily function.  Differential diagnosis includes, but is not limited to, ACS, PE, dissection, pneumonia, pneumothorax, GERD, musculoskeletal pain, anxiety, dehydration, electrolyte abnormality, AKI, anemia, UTI, progressive Parkinson's disease.  Patient chronically ill but nontoxic-appearing and in no acute distress, vital signs are unremarkable.  EKG interpretation is limited due to patient's tremor but no obvious ischemic changes noted and he is chest pain-free currently.  Troponin is mildly elevated but symptoms seem atypical for ACS and we will trend.  Remainder of labs are reassuring with stable chronic kidney disease, no significant electrolyte abnormality or anemia, mild leukocytosis noted.  Chest x-ray is unremarkable with no evidence of infection, will also check urinalysis.  Plan to hydrate with IV fluids given patient's decreased oral intake, also check testing for COVID and flu.  Urinalysis shows no signs of infection, viral testing is negative.  Repeat troponin is slightly uptrending, patient has not had elevated troponin in the past.  He remains chest pain-free and I  doubt PE or dissection, but he would benefit from observation for further cardiac rule out.  We will give loading dose of aspirin, case discussed with hospitalist for admission.      FINAL CLINICAL IMPRESSION(S) / ED DIAGNOSES   Final diagnoses:  Nonspecific chest pain  Elevated troponin  Generalized weakness     Rx / DC Orders   ED Discharge Orders     None        Note:  This document was prepared using Dragon voice recognition software and may include unintentional dictation errors.   Blake Divine, MD 01/05/23 1726

## 2023-01-05 NOTE — ED Triage Notes (Signed)
Pt arrives via EMS from home. Pt sts that he has been having chest pain and weakness for the last several days. Pt has been seen by his PCP for the same and has had blood work and imagine and everything has been normal.

## 2023-01-05 NOTE — H&P (Signed)
History and Physical    Charles Choi [id number] DOB: 03-Dec-1950 DOA: 01/05/2023  PCP: Idelle Crouch, MD  Patient coming from: independent living part of retirement community  I have personally briefly reviewed patient's old medical records in [place]  Chief Complaint: depression and weakness  HPI: Charles Choi is a 72 y.o. male with medical history significant of Parkinson's with deep brain stimulator, multiple incidences of basal cell carcinoma, HTN, anxiety and depression who presented from independent-living part of retirement community for weakness and depression.  Pt's main complaints were depression and weakness.  Weakness has been progressive for a while now, and now he can barely walks with his rollator for 15 feet.  Pt had scheduled PT/OT eval at his retirement community, but he said he was told to come to the hospital for evaluation.  Pt confirmed decreased appetite and oral intake, and about 5 lbs weight loss in 2-3 months.  Pt denied trouble swallowing food.  Pt said he has had Parkinson's for >40 years.    As for chest pain, pt reported a single episode of chest pain, sharp, located in the mid chest, lasted only 3 seconds and didn't return.  No prior hx of chest pain.  No fever, dyspnea, abdominal pain, N/V/D, dysuria.  ED Course: initial vitals all wnl.  Labs notable for WBC 13.5, Cr 1.27 (baseline), trop 33 and 57.  CXR no acute finding.  ED provider requested admission for trop elevation.  Pt admitted for observation.  Assessment/Plan  Weakness --progressive, worsening exercise tolerance.  Could be part of disease progression of parkinson's, but want to rule out cardiac etiology. --Echo to assess cardiac function --PT/OT  Chest pain, atypical  Mild trop elevation --centrally located, sharp and only lasted 3 sec.  trop 33 and 57. --No need to start heparin gtt --obtain Echo --hold off cardio consult until Echo results  Parkinson's with deep brain  stimulator --pt said he was dx in his 20's. --cont home Sinemet --cont home Requip  Anxiety and depression --cont home Effexor --cont home Klonopin PRN  HTN --cont home Lopressor --hold home losartan for now   DVT prophylaxis: Lovenox SQ Code Status: Full code discussed with pt on admission. Family Communication:   Disposition Plan: to be determined  Consults called: none Level of care: Med-Surg   Review of Systems: As per HPI otherwise complete review of systems negative.   Past Medical History:  Diagnosis Date   Anxiety    Basal cell carcinoma 09/04/2017   left infrascapular mid back   Basal cell carcinoma 09/28/2015   left low back 11 cm lat to spinal scar   Basal cell carcinoma 09/28/2015   left posterior waistline 5.5 cm lateral to spinal scar   Basal cell carcinoma 07/06/2015   left medial low back   Basal cell carcinoma 07/06/2015   left low back lateral   Basal cell carcinoma 05/06/2015   right lower back   Basal cell carcinoma 09/16/2021   right medial mid chest, [facility] 12/01/21   Basal cell carcinoma 09/16/2021   left mid medial back, [facility] 12/01/21   Basal cell carcinoma 09/16/2021   right mid lower back   Basal cell carcinoma 04/06/2022   low back spinal above sacral - [facility]   Basal cell carcinoma 04/06/2022   Right post waistline - [facility]   Cancer ([place])    Depression    Hypertension    Parkinson's disease    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 He has never used smokeless tobacco. He reports current alcohol use. He reports  current drug use. Drug: Marijuana.  Allergies  Allergen Reactions   Promethazine Other (See Comments)    Ocular gyrus and light sensitivity and anxiousness. When he was going through chemo in 1984.   Metoclopramide Other (See Comments)    Pass out  Pass out. When he was going through chemo in 1984.   Phenergan [Promethazine Hcl] Other (See Comments)    Unknown     Family History  Problem Relation Age of Onset   Hypertension Mother    Hypertension Father    Hypertension Sister    Hypertension Brother    Heart disease Brother      Prior to Admission medications   Medication Sig Start Date End Date Taking? Authorizing Provider  acetaminophen (TYLENOL) 325 MG tablet Take 925 mg by mouth every 6 (six) hours as needed for mild pain or moderate pain. 08/30/18   [provider]  Aspirin 81 MG CAPS Take 81 mg by mouth 2 (two) times daily. 08/30/18   [provider]  atorvastatin (LIPITOR) 20 MG tablet Take 1 tablet (20 mg total) by mouth daily. 09/19/18   Gerlene Fee, NP  carbidopa-levodopa (SINEMET IR) 25-100 MG tablet Take 1 tablet orally two times a day at 7am, and 10pm. Take 0.5 tablet orally at 10am, 1pm, 4pm, and 7pm. Patient taking differently: Take 1 tablet by mouth 5 (five) times daily. 1.5 tab at 7 a.m., 1 tab at 10 a.m., 1 p.m.,  4 p.m. and 7 p.m. 09/19/18   Gerlene Fee, NP  Carbidopa-Levodopa ER (SINEMET CR) 25-100 MG tablet controlled release Take 1 tablet by mouth at bedtime.    [provider]  clonazePAM (KLONOPIN) 1 MG tablet Take 2 mg by mouth at bedtime as needed. 09/13/22   [provider]  ketoconazole (NIZORAL) 2 % cream Apply 1 application topically daily. Apply to flaky areas daily as needed    [provider]  losartan (COZAAR) 100 MG tablet Take 1 tablet (100 mg total) by mouth daily. 09/19/18   Gerlene Fee, NP  Melatonin 3 MG CAPS Take 1-2 tablets by mouth 2 hours before bedtime    [provider]   meloxicam (MOBIC) 7.5 MG tablet Take 1 tablet (7.5 mg total) by mouth daily. 09/19/18   Gerlene Fee, NP  metoprolol tartrate (LOPRESSOR) 25 MG tablet Take 25 mg by mouth 2 (two) times daily. 06/28/19   [provider]  mirabegron ER (MYRBETRIQ) 50 MG TB24 tablet Take 1 tablet (50 mg total) by mouth daily. 01/03/23   Stoioff, Ronda Fairly, MD  NON FORMULARY Diet Type: Regular    [provider]  omeprazole (PRILOSEC) 40 MG capsule Take 40 mg by mouth daily. 05/01/20   [provider]  Potassium Citrate 15 MEQ (1620 MG) TBCR Take 1 tablet by mouth 2 (two) times daily. 09/19/18   Gerlene Fee, NP  rOPINIRole (REQUIP) 2  MG tablet Take 1 tablet (2 mg total) by mouth every 3 (three) hours. At 7am, and 1pm. Patient taking differently: Take 0.5 mg by mouth every 3 (three) hours. 7 a.m., 10 a.m., 1 p.m., 4 p.m., 7 p.m., 10 p.m. 09/19/18   Gerlene Fee, NP  selegiline (ELDEPRYL) 5 MG capsule Take 1 capsule (5 mg total) by mouth 2 (two) times daily. At 7am and 1pm. Patient not taking: Reported on 01/05/2023 09/19/18   Gerlene Fee, NP  tadalafil (CIALIS) 20 MG tablet TAKE ONE TABLET BY MOUTH EVERY DAY 10/17/22   Stoioff, Ronda Fairly, MD  tamsulosin (FLOMAX) 0.4 MG CAPS capsule Take 1 capsule (0.4 mg total) by mouth daily. 12/12/22   Stoioff, Ronda Fairly, MD  testosterone cypionate (DEPOTESTOSTERONE CYPIONATE) 200 MG/ML injection Inject 1 mL (200 mg total) into the muscle every 14 (fourteen) days. 03/13/22   Stoioff, Ronda Fairly, MD  traZODone (DESYREL) 150 MG tablet Take 0.5 tablets (75 mg total) by mouth at bedtime as needed. 09/19/18   Gerlene Fee, NP  venlafaxine XR (EFFEXOR-XR) 150 MG 24 hr capsule Take 150 mg by mouth at bedtime. 09/14/22 09/14/23  [provider]    Physical Exam: Vitals:   01/05/23 1530 01/05/23 1600 01/05/23 1700 01/05/23 1800  BP: 135/86     Pulse: (!) 114 90 (!) 102   Resp: 16 16    Temp:    98.4 F (36.9 C)  TempSrc:      SpO2: 93% 99% 97%    Weight:        Constitutional: NAD, AAOx3 HEENT: conjunctivae and lids normal, EOMI CV: No cyanosis.   RESP: normal respiratory effort, on RA Extremities: No effusions, edema in BLE SKIN: warm, dry Neuro: II - XII grossly intact.   Psych: depressed mood and affect.  Appropriate judgement and reason  Labs on Admission: I have personally reviewed labs and imaging studies  Time spent: 60 minutes  Enzo Bi MD [facility]  If 7PM-7AM, please contact night-coverage 01/05/2023, 6:35 PM

## 2023-01-06 ENCOUNTER — Observation Stay
Admit: 2023-01-06 | Discharge: 2023-01-06 | Disposition: A | Discharge status: Home / Self Care | Payer: Medicare Other | Attending: Hospitalist | Admitting: Hospitalist

## 2023-01-06 ENCOUNTER — Telehealth: Payer: Self-pay | Admitting: Urology

## 2023-01-06 DIAGNOSIS — R531 Weakness: Secondary | ICD-10-CM

## 2023-01-06 DIAGNOSIS — R079 Chest pain, unspecified: Secondary | ICD-10-CM

## 2023-01-06 DIAGNOSIS — R0789 Other chest pain: Secondary | ICD-10-CM | POA: Diagnosis not present

## 2023-01-06 LAB — CBC
HCT: 40.6 % (ref 39.0–52.0)
Hemoglobin: 12.1 g/dL — ABNORMAL LOW (ref 13.0–17.0)
MCH: 24.2 pg — ABNORMAL LOW (ref 26.0–34.0)
MCHC: 29.8 g/dL — ABNORMAL LOW (ref 30.0–36.0)
MCV: 81.4 fL (ref 80.0–100.0)
Platelets: 202 10*3/uL (ref 150–400)
RBC: 4.99 MIL/uL (ref 4.22–5.81)
RDW: 16.8 % — ABNORMAL HIGH (ref 11.5–15.5)
WBC: 8.1 10*3/uL (ref 4.0–10.5)
nRBC: 0 % (ref 0.0–0.2)

## 2023-01-06 LAB — BASIC METABOLIC PANEL
Anion gap: 8 (ref 5–15)
BUN: 29 mg/dL — ABNORMAL HIGH (ref 8–23)
CO2: 26 mmol/L (ref 22–32)
Calcium: 9 mg/dL (ref 8.9–10.3)
Chloride: 105 mmol/L (ref 98–111)
Creatinine, Ser: 1.24 mg/dL (ref 0.61–1.24)
GFR, Estimated: >60 mL/min (ref >60)
Glucose, Bld: 96 mg/dL (ref 70–99)
Potassium: 3.8 mmol/L (ref 3.5–5.1)
Sodium: 139 mmol/L (ref 135–145)

## 2023-01-06 LAB — MAGNESIUM: Magnesium: 2.3 mg/dL (ref 1.7–2.4)

## 2023-01-06 LAB — PHOSPHORUS: Phosphorus: 3.5 mg/dL (ref 2.5–4.6)

## 2023-01-06 LAB — TROPONIN I (HIGH SENSITIVITY): Troponin I (High Sensitivity): 43 ng/L — ABNORMAL HIGH (ref <18)

## 2023-01-06 LAB — HIV ANTIBODY (ROUTINE TESTING W REFLEX): HIV Screen 4th Generation wRfx: NONREACTIVE

## 2023-01-06 MED ORDER — ADULT MULTIVITAMIN W/MINERALS CH
1.0000 | ORAL_TABLET | Freq: Every day | ORAL | Status: DC
  Administered 2023-01-06 – 2023-01-07 (×2): 1 via ORAL
  Filled 2023-01-06 (×2): qty 1

## 2023-01-06 MED ORDER — PNEUMOCOCCAL 20-VAL CONJ VACC 0.5 ML IM SUSY
0.5000 mL | PREFILLED_SYRINGE | INTRAMUSCULAR | Status: DC
  Filled 2023-01-06: qty 0.5

## 2023-01-06 MED ORDER — ENSURE ENLIVE PO LIQD
237.0000 mL | Freq: Two times a day (BID) | ORAL | Status: DC
  Administered 2023-01-06 – 2023-01-07 (×4): 237 mL via ORAL

## 2023-01-06 MED ORDER — ROPINIROLE HCL 1 MG PO TABS
0.5000 mg | ORAL_TABLET | ORAL | Status: DC
  Administered 2023-01-06 – 2023-01-07 (×7): 0.5 mg via ORAL
  Filled 2023-01-06 (×7): qty 1

## 2023-01-06 MED ORDER — GEMTESA 75 MG PO TABS: 75.0000 mg | ORAL_TABLET | Freq: Every day | ORAL | 0 refills | Status: DC

## 2023-01-06 NOTE — Telephone Encounter (Signed)
I am not sure if he has tried [place]  He can check his insurance to see if this is covered.  If not we will need to see what approved OAB meds on his insurance plan are

## 2023-01-06 NOTE — Telephone Encounter (Signed)
Patient notified and will try to pick up [employer].

## 2023-01-06 NOTE — Progress Notes (Signed)
  PROGRESS NOTE    Charles Choi  [id number] DOB: 1950-11-28 DOA: 01/05/2023 PCP: Idelle Crouch, MD  [id number]  LOS: 0 days   Brief hospital course:   Assessment & Plan: MARLOW GUARD is a 72 y.o. male with medical history significant of Parkinson's with deep brain stimulator, multiple incidences of basal cell carcinoma, HTN, anxiety and depression who presented from independent-living part of retirement community for weakness and depression.    Weakness --progressive, worsening exercise tolerance.  Could be part of disease progression of parkinson's, but want to rule out cardiac etiology. --Echo to assess cardiac function --PT/OT --will need SNF level of care    Chest pain, atypical  Mild trop elevation --centrally located, sharp and only lasted 3 sec.  trop 33, 57, 43. --No need to start heparin gtt --obtain Echo --hold off cardio consult until Echo results   Parkinson's with deep brain stimulator --pt said he was dx in his 20's. --cont home Sinemet --cont home Requip   Anxiety and depression --cont home Effexor --cont home Klonopin PRN   HTN --cont home Lopressor --hold home losartan for now   DVT prophylaxis: Lovenox SQ Code Status: Full code  Family Communication:  Level of care: Med-Surg Dispo:   The patient is from: independent living Anticipated d/c is to: SNF Anticipated d/c date is: tomorrow   Subjective and Interval History:  Pt reported feeling better.  No chest pain.  PT rec SNF and pt said his retirement community already approved him for SNF level of care.   Objective: Vitals:   01/06/23 0441 01/06/23 0801 01/06/23 1230 01/06/23 1513  BP: (!) 141/66 118/66 (!) 156/73 125/77  Pulse: 64 65 84 75  Resp: '20 18 20 18  '$ Temp: 97.7 F (36.5 C) 97.7 F (36.5 C) (!) 97.5 F (36.4 C) 98 F (36.7 C)  TempSrc:  Oral Oral   SpO2: 96% 93% 93% 95%  Weight:      Height:        Intake/Output Summary (Last 24 hours) at 01/06/2023 1939 Last  data filed at 01/06/2023 1809 Gross per 24 hour  Intake 960 ml  Output 676 ml  Net 284 ml   Filed Weights   01/05/23 1345 01/05/23 2041  Weight: 71.7 kg 68.9 kg    Examination:   Constitutional: NAD, AAOx3 HEENT: conjunctivae and lids normal, EOMI CV: No cyanosis.   RESP: normal respiratory effort, on RA Neuro: II - XII grossly intact.   Psych: Normal mood and affect.  Appropriate judgement and reason   Data Reviewed: I have personally reviewed labs and imaging studies  Time spent: 25 minutes  Enzo Bi, MD [facility] If 7PM-7AM, please contact night-coverage 01/06/2023, 7:39 PM

## 2023-01-06 NOTE — Telephone Encounter (Signed)
Patient called and stated that pharmacy told him Myrbetriq will cost $432.00, and he cannot afford this. He would like someone to call him to discuss another option. His number is [phone]

## 2023-01-06 NOTE — TOC Progression Note (Addendum)
Transition of Care [facility]) - Progression Note    Patient Details  Name: Charles Choi MRN: [id number] Date of Birth: 02/28/1951  Transition of Care [facility]) CM/SW Contact  Ross Ludwig, [place] Phone Number: 01/06/2023, 9:41 PM  Clinical Narrative:     TOC received phone call from Maudie Mercury at [facility].  Per Maudie Mercury patient lives at the [facility].  Per PT patient will need SNF placement.  Per Maudie Mercury patient can go to [facility] if agreeable, however patient will have to private pay since he is in observation and has not had a 3 day inpatient qualifying stay.  If patient agreeable to SNF and he is medically ready they can accept him on Monday.    TOC was unable to speak to patient today.  CSW to ask weekend [facility] staff to speak to patient and discuss SNF placement.   Formal assessment to follow.        Expected Discharge Plan and Services                                               Social Determinants of Health (SDOH) Interventions SDOH Screenings   Food Insecurity: No Food Insecurity (01/05/2023)  Housing: Low Risk  (01/05/2023)  Transportation Needs: No Transportation Needs (01/05/2023)  Utilities: Not At Risk (01/05/2023)  Tobacco Use: Low Risk  (01/05/2023)    Readmission Risk Interventions     No data to display

## 2023-01-06 NOTE — Evaluation (Signed)
Physical Therapy Evaluation Patient Details Name: Charles Choi MRN: [id number] DOB: 07-16-1951 Today's Date: 01/06/2023  History of Present Illness  Pt is a 72 y/o M admitted on 01/05/23 after presenting with c/o weakness & depression. PMH: Parkinson's with deep brain stimulator, basal cell carcinoma, HTN, anxiety, depression  Clinical Impression  Pt seen for PT evaluation with pt received in handoff from OT. Pt reports prior to admission he was living in [place] apartment at [facility] at [place], ambulating with rollator, endorsing 3-4 falls in the past 6 months. On this date, pt requires min assist for gait with RW. Pt demonstrates impaired gait pattern, requiring cuing for "big" steps with fair return demonstration. Pt engaged in balance activities requiring up to mod assist & multimodal cuing for anterior weight shifting. Recommend ongoing PT services to address balance, gait with LRAD, and safety with mobility to reduce fall risk.     Recommendations for follow up therapy are one component of a multi-disciplinary discharge planning process, led by the attending physician.  Recommendations may be updated based on patient status, additional functional criteria and insurance authorization.  Follow Up Recommendations Skilled nursing-short term rehab (<3 hours/day) Can patient physically be transported by private vehicle: Yes    Assistance Recommended at Discharge Frequent or constant Supervision/Assistance  Patient can return home with the following  A little help with walking and/or transfers;A little help with bathing/dressing/bathroom;Assistance with cooking/housework;Assist for transportation    Equipment Recommendations None recommended by PT  Recommendations for Other Services       Functional Status Assessment Patient has had a recent decline in their functional status and demonstrates the ability to make significant improvements in function in a reasonable and predictable amount of  time.     Precautions / Restrictions Precautions Precautions: Fall Restrictions Weight Bearing Restrictions: No      Mobility  Bed Mobility               General bed mobility comments: not tested, pt received in bathroom in handoff from OT    Transfers   Equipment used: Rolling walker (2 wheels) Transfers: Sit to/from Stand Sit to Stand: Min guard, Min assist           General transfer comment: STS from low toilet in bathroom with RW & grab bar, from recliner with CGA<>min assist    Ambulation/Gait Ambulation/Gait assistance: Min assist, Min guard Gait Distance (Feet): 20 Feet (+ 60 ft) Assistive device: Rolling walker (2 wheels) Gait Pattern/deviations: Decreased step length - right, Decreased step length - left, Decreased stride length Gait velocity: decreased     General Gait Details: Pt with alternating step-to and step-through pattern, will demonstrate festinating gait pattern but with cuing for "big" steps be able to increase step length BLE. Pt appears to have LLE weakness compared to RLE.  Stairs            Wheelchair Mobility    Modified Rankin (Stroke Patients Only)       Balance Overall balance assessment: Needs assistance Sitting-balance support: Feet supported, Single extremity supported Sitting balance-Leahy Scale: Good     Standing balance support: Reliant on assistive device for balance, During functional activity, Bilateral upper extremity supported Standing balance-Leahy Scale: Poor                               Pertinent Vitals/Pain Pain Assessment Pain Assessment: No/denies pain    Home Living Family/patient expects to  be discharged to::  (Independent living apartment at [place])                 Home Equipment: Conservation officer, nature (2 wheels);Shower seat;Rollator (4 wheels);Electric scooter      Prior Function Prior Level of Function : Independent/Modified Independent;Driving              Mobility Comments: Pt reports he was ambulatory with rollator in his apartment, used electric scooter if not feeling as well. Rides scooter to dining hall. ADLs Comments: Pt reports ambulation with rollator and use of electric scooter depending on how he is feeling. He rides scooter to dining hall. Pt endorses being Ind in self care and IADLs.     Hand Dominance   Dominant Hand: Right    Extremity/Trunk Assessment   Upper Extremity Assessment Upper Extremity Assessment: Defer to OT evaluation RUE Deficits / Details: Pt with arthritic joints of BUE hands LUE Deficits / Details: shoulder elevation limited to ~ 75 degrees    Lower Extremity Assessment Lower Extremity Assessment: Generalized weakness    Cervical / Trunk Assessment Cervical / Trunk Assessment:  (Scoliosis)  Communication   Communication: No difficulties  Cognition Arousal/Alertness: Awake/alert Behavior During Therapy: WFL for tasks assessed/performed Overall Cognitive Status: Within Functional Limits for tasks assessed                                          General Comments      Exercises Other Exercises Other Exercises: Pt engaged in standing balance activities: standing without BUE support, normal BOS, eyes open>eyes closed; standing with narrow BOS without UE support with eyes open>eyes closed with up to mod assist for standing balance. Pt with worsening posterior lean with activities requiring multimodal cuing for correction & anterior weight shifting.   Assessment/Plan    PT Assessment Patient needs continued PT services  PT Problem List Decreased strength;Decreased activity tolerance;Decreased balance;Decreased mobility;Decreased knowledge of use of DME       PT Treatment Interventions DME instruction;Therapeutic exercise;Neuromuscular re-education;Balance training;Gait training;Functional mobility training;Therapeutic activities;Patient/family education    PT Goals (Current  goals can be found in the Care Plan section)  Acute Rehab PT Goals Patient Stated Goal: go home PT Goal Formulation: With patient Time For Goal Achievement: 01/20/23 Potential to Achieve Goals: Good    Frequency Min 2X/week     Co-evaluation               AM-PAC PT "6 Clicks" Mobility  Outcome Measure Help needed turning from your back to your side while in a flat bed without using bedrails?: None Help needed moving from lying on your back to sitting on the side of a flat bed without using bedrails?: A Little Help needed moving to and from a bed to a chair (including a wheelchair)?: A Little Help needed standing up from a chair using your arms (e.g., wheelchair or bedside chair)?: A Little Help needed to walk in hospital room?: A Little Help needed climbing 3-5 steps with a railing? : A Little 6 Click Score: 19    End of Session   Activity Tolerance: Patient tolerated treatment well Patient left: in chair;with call bell/phone within reach;with chair alarm set Nurse Communication: Mobility status PT Visit Diagnosis: Unsteadiness on feet (R26.81);Other abnormalities of gait and mobility (R26.89);Muscle weakness (generalized) (M62.81)    Time: [id number] PT Time Calculation (min) (ACUTE  ONLY): 19 min   Charges:   PT Evaluation $PT Eval Moderate Complexity: 1 Mod PT Treatments $Neuromuscular Re-education: 8-22 mins        Lavone Nian, PT, DPT 01/06/23, 12:42 PM   Waunita Schooner 01/06/2023, 12:41 PM

## 2023-01-06 NOTE — Evaluation (Signed)
Occupational Therapy Evaluation Patient Details Name: Charles Choi MRN: [id number] DOB: [birth year] Today's Date: 01/06/2023   History of Present Illness 72 y.o. male with medical history significant of Parkinson's with deep brain stimulator, multiple incidences of basal cell carcinoma, HTN, anxiety and depression who presented from independent-living part of retirement community for weakness and depression.   Clinical Impression   Patient presenting with decreased Ind in self care,balance, functional mobility/transfers, and endurance, Patient reports living in an ILF apartment and alone at baseline. Pt endorses ambulation with rollator or use of electric scooter (for bad days and to get to dining room). Pt reports driving for groceries and performing ADLs and all IADLs without assistance. Patient currently functioning at min guard - min A for functional mobility and transfers. Pt needing cuing for safety awareness and LE advancement.  Patient will benefit from acute OT to increase overall independence in the areas of ADLs, functional mobility, and safety awareness in order to safely discharge to next venue of care.       Recommendations for follow up therapy are one component of a multi-disciplinary discharge planning process, led by the attending physician.  Recommendations may be updated based on patient status, additional functional criteria and insurance authorization.   Follow Up Recommendations  Skilled nursing-short term rehab (<3 hours/day)     Assistance Recommended at Discharge Intermittent Supervision/Assistance  Patient can return home with the following A little help with walking and/or transfers;A little help with bathing/dressing/bathroom;Assistance with cooking/housework;Assist for transportation    Functional Status Assessment  Patient has had a recent decline in their functional status and demonstrates the ability to make significant improvements in function in a reasonable  and predictable amount of time.  Equipment Recommendations  None recommended by OT       Precautions / Restrictions Precautions Precautions: Fall      Mobility Bed Mobility Overal bed mobility: Needs Assistance Bed Mobility: Supine to Sit     Supine to sit: Min assist     General bed mobility comments: for trunk support    Transfers Overall transfer level: Needs assistance Equipment used: 1 person hand held assist, Rolling walker (2 wheels) Transfers: Sit to/from Stand Sit to Stand: Min assist                  Balance Overall balance assessment: Needs assistance Sitting-balance support: Feet supported, Single extremity supported Sitting balance-Leahy Scale: Good     Standing balance support: Reliant on assistive device for balance, During functional activity, Bilateral upper extremity supported Standing balance-Leahy Scale: Poor                             ADL either performed or assessed with clinical judgement   ADL Overall ADL's : Needs assistance/impaired                         Toilet Transfer: Minimal assistance;Rolling walker (2 wheels);Ambulation           Functional mobility during ADLs: Min guard;Minimal assistance;Rolling walker (2 wheels)       Vision Patient Visual Report: No change from baseline              Pertinent Vitals/Pain Pain Assessment Pain Assessment: No/denies pain     Hand Dominance Right   Extremity/Trunk Assessment Upper Extremity Assessment Upper Extremity Assessment: LUE deficits/detail;RUE deficits/detail RUE Deficits / Details: shoulder elevation ~ 90 degrees but functional LUE  Deficits / Details: shoulder elevation limited to ~ 75 degrees   Lower Extremity Assessment Lower Extremity Assessment: Generalized weakness       Communication Communication Communication: No difficulties   Cognition Arousal/Alertness: Awake/alert Behavior During Therapy: WFL for tasks  assessed/performed Overall Cognitive Status: Within Functional Limits for tasks assessed                                                  Home Living Family/patient expects to be discharged to:: Assisted living                             Home Equipment: Rolling Walker (2 wheels);Shower seat;Rollator (4 wheels);Electric scooter          Prior Functioning/Environment Prior Level of Function : Independent/Modified Independent;Driving               ADLs Comments: Pt reports ambulation with rollator and use of electric scooter depending on how he is feeling. He rides scooter to dining hall. Pt endorses being Ind in self care and IADLs.        OT Problem List: Decreased strength;Decreased activity tolerance;Impaired balance (sitting and/or standing);Decreased safety awareness      OT Treatment/Interventions: Self-care/ADL training;Therapeutic exercise;Therapeutic activities;DME and/or AE instruction;Patient/family education;Balance training;Energy conservation    OT Goals(Current goals can be found in the care plan section) Acute Rehab OT Goals Patient Stated Goal: to get stronger OT Goal Formulation: With patient Time For Goal Achievement: 01/20/23 Potential to Achieve Goals: Good ADL Goals Pt Will Perform Grooming: with modified independence;standing Pt Will Perform Lower Body Dressing: with supervision;sit to/from stand Pt Will Transfer to Toilet: with supervision;ambulating Pt Will Perform Toileting - Clothing Manipulation and hygiene: with supervision;sit to/from stand  OT Frequency: Min 2X/week       AM-PAC OT "6 Clicks" Daily Activity     Outcome Measure Help from another person eating meals?: None Help from another person taking care of personal grooming?: A Little Help from another person toileting, which includes using toliet, bedpan, or urinal?: A Little Help from another person bathing (including washing, rinsing, drying)?: A  Little Help from another person to put on and taking off regular upper body clothing?: None Help from another person to put on and taking off regular lower body clothing?: A Little 6 Click Score: 20   End of Session Equipment Utilized During Treatment: Rolling walker (2 wheels) Nurse Communication: Mobility status  Activity Tolerance: Patient tolerated treatment well Patient left: in bed;with call bell/phone within reach;with bed alarm set  OT Visit Diagnosis: Unsteadiness on feet (R26.81);Repeated falls (R29.6);Muscle weakness (generalized) (M62.81)                Time: [id number] OT Time Calculation (min): 16 min Charges:  OT General Charges $OT Visit: 1 Visit OT Evaluation $OT Eval Low Complexity: 1 Low OT Treatments $Self Care/Home Management : 8-22 mins  Darleen Crocker, MS, OTR/L , CBIS ascom [phone]  01/06/23, 12:30 PM

## 2023-01-06 NOTE — Progress Notes (Signed)
*  PRELIMINARY RESULTS* Echocardiogram 2D Echocardiogram has been performed.  Charles Choi 01/06/2023, 10:29 AM

## 2023-01-06 NOTE — Progress Notes (Signed)
Initial Nutrition Assessment  DOCUMENTATION CODES:   Not applicable  INTERVENTION:   -Ensure Enlive po BID, each supplement provides 350 kcal and 20 grams of protein.  -MVI with minerals daily  NUTRITION DIAGNOSIS:   Increased nutrient needs related to chronic illness (Parkinson's) as evidenced by estimated needs.  GOAL:   Patient will meet greater than or equal to 90% of their needs  MONITOR:   PO intake, Supplement acceptance  REASON FOR ASSESSMENT:   Malnutrition Screening Tool    ASSESSMENT:   Pt with medical history significant of Parkinson's with deep brain stimulator, multiple incidences of basal cell carcinoma, HTN, anxiety and depression who presented from independent-living part of retirement community for weakness and depression.  Pt admitted with weakness and depression.    Reviewed I/O's: -11 ml x 24 hours  UOP: 250 ml x 24 hours  Spoke with pt at bedside, who was pleasant and in good spirits at time of visit. Pt laughing and joking with this RD during visit. Per pt, he has resided at [facility] facility for the past 2 years. Pt shares that he has been through "a lot", especially over the past 4 months. Pt shares that he felt depressed and often did not feel like eating at times. Additionally, pt had an appendectomy in November 2023, which also impacted his intake. Pt became tearful at time of visit; RD provided comfort and emotional support and created a safe space for pt to express his feelings if he felt comfortable doing so. Pt shares that he "didn't want to get into it", but reports feeling better. Pt also talked about multiple friends and family members who are active in his life and important to him and credits meal times at [facility] to providing the social interactions that he needs.   Pt reports he receives 2 meals per day at [facility] [place]. Facility has many dining facilities and options and frequently  consumed foods include pizza, soups, salads, sandwiches, or "a hot meal". Pt also reports eating [employer] S[employer] egg bites for breakfast. He also consumes 1-2 Premier Protein or Fairlife drinks daily. Noted meal completions 100%.   Pt reports his UBW is around 165# and estimates a 15# wt loss over the past 4 months. Reviewed wt hx; pt has experienced a 6.3% wt loss over the past 3 months, which while not significant for time frame is concerning given pt's decreased oral intake secondary to depression.   Discussed importance of good meal and supplement intake to promote healing. Pt amenable to continuing supplements. Encouraged pt to continue these at discharge.   Medications reviewed and include carbidopa-levodopa.   Labs reviewed.  NUTRITION - FOCUSED PHYSICAL EXAM:  Flowsheet Row Most Recent Value  Orbital Region Mild depletion  Upper Arm Region No depletion  Thoracic and Lumbar Region No depletion  Buccal Region No depletion  Temple Region Mild depletion  Clavicle Bone Region No depletion  Clavicle and Acromion Bone Region No depletion  Scapular Bone Region No depletion  Dorsal Hand Mild depletion  Patellar Region No depletion  Anterior Thigh Region No depletion  Posterior Calf Region No depletion  Edema (RD Assessment) None  Hair Reviewed  Eyes Reviewed  Mouth Reviewed  Skin Reviewed  Nails Reviewed       Diet Order:   Diet Order             Diet regular Room service appropriate? Yes; Fluid consistency: Thin  Diet effective now  EDUCATION NEEDS:   Education needs have been addressed  Skin:  Skin Assessment: Reviewed RN Assessment  Last BM:  Unknown  Height:   Ht Readings from Last 1 Encounters:  01/05/23 '5\' 8"'$  (1.727 m)    Weight:   Wt Readings from Last 1 Encounters:  01/05/23 68.9 kg    Ideal Body Weight:  70 kg  BMI:  Body mass index is 23.1 kg/m.  Estimated Nutritional Needs:   Kcal:  1900-2100  Protein:  90-105  grams  Fluid:  > 1.9 L    Loistine Chance, RD, LDN, [place] Registered Dietitian II Certified Diabetes Care and Education Specialist Please refer to [facility] for RD and/or RD on-call/weekend/after hours pager

## 2023-01-07 DIAGNOSIS — R0789 Other chest pain: Secondary | ICD-10-CM | POA: Diagnosis not present

## 2023-01-07 DIAGNOSIS — R531 Weakness: Secondary | ICD-10-CM | POA: Diagnosis not present

## 2023-01-07 LAB — ECHOCARDIOGRAM COMPLETE
AR max vel: 2.33 cm2
AV Area VTI: 2.1 cm2
AV Area mean vel: 2.38 cm2
AV Mean grad: 9 mmHg
AV Peak grad: 15.5 mmHg
Ao pk vel: 1.97 m/s
Area-P 1/2: 2.29 cm2
Height: 68 in
MV VTI: 2.78 cm2
S' Lateral: 2.4 cm
Weight: 2430.4 oz

## 2023-01-07 MED ORDER — CARBIDOPA-LEVODOPA 25-100 MG PO TABS: 1.0000 | ORAL_TABLET | Freq: Every day | ORAL | Status: AC

## 2023-01-07 MED ORDER — ADULT MULTIVITAMIN W/MINERALS CH: 1.0000 | ORAL_TABLET | Freq: Every day | ORAL | Status: AC

## 2023-01-07 MED ORDER — ENSURE ENLIVE PO LIQD: 237.0000 mL | Freq: Two times a day (BID) | ORAL | 12 refills | Status: AC

## 2023-01-07 NOTE — Discharge Summary (Signed)
Physician Discharge Summary   Charles Choi  male DOB: Aug 17, 1951  [id number]  PCP: Idelle Crouch, MD  Admit date: 01/05/2023 Discharge date: 01/07/2023  Admitted From: independent living Disposition:  PT/OT rec SNF, however, pt decided to go back home and transition to SNF level of care later on his own. CODE STATUS: Full code  Discharge Instructions     Diet general   Complete by: As directed    Discharge instructions   Complete by: As directed    Ultrasound of your heart looks fine.   Dr. Enzo Bi [facility] Course:  For full details, please see H&P, progress notes, consult notes and ancillary notes.  Briefly,  Charles Choi is a 73 y.o. male with medical history significant of Parkinson's with deep brain stimulator, multiple incidences of basal cell carcinoma, HTN, anxiety and depression who presented from independent-living part of retirement community for weakness and depression.    Weakness --progressive, worsening exercise tolerance.  Could be part of disease progression of parkinson's, but want to rule out cardiac etiology. --Echo without wall motion abnormalities, with normal LVEF. --PT/OT rec SNF, however, pt decided to go back home and transition to SNF level of care later on his own.   Chest pain, atypical  Mild trop elevation 2/2 demand ischemia --centrally located, sharp and only lasted 3 sec.  trop 33, 57, 43. --No need to start heparin gtt --Echo without wall motion abnormalities, with normal LVEF.   Parkinson's with deep brain stimulator --pt said he was dx in his 1's. --cont home Sinemet --cont home Requip   Anxiety and depression --cont home Effexor --cont home Klonopin PRN   HTN --cont home Lopressor --home losartan held during hospitalization, resumed after discharge.   Unless noted above, medications under "STOP" list are ones pt was not taking PTA.  Discharge Diagnoses:  Principal Problem:   Chest  pain     Discharge Instructions:  Allergies as of 01/07/2023       Reactions   Promethazine Other (See Comments)   Ocular gyrus and light sensitivity and anxiousness. When he was going through chemo in 1984.   Metoclopramide Other (See Comments)   Pass out Pass out. When he was going through chemo in 1984.   Phenergan [promethazine Hcl] Other (See Comments)   Unknown         Medication List     STOP taking these medications    meloxicam 7.5 MG tablet Commonly known as: MOBIC   Potassium Citrate 15 MEQ (1620 MG) Tbcr   selegiline 5 MG capsule Commonly known as: ELDEPRYL   traZODone 150 MG tablet Commonly known as: DESYREL   venlafaxine XR 150 MG 24 hr capsule Commonly known as: EFFEXOR-XR       TAKE these medications    acetaminophen 325 MG tablet Commonly known as: TYLENOL Take 925 mg by mouth every 6 (six) hours as needed for mild pain or moderate pain.   Aspirin 81 MG Caps Take 81 mg by mouth 2 (two) times daily.   atorvastatin 20 MG tablet Commonly known as: LIPITOR Take 1 tablet (20 mg total) by mouth daily.   Carbidopa-Levodopa ER 25-100 MG tablet controlled release Commonly known as: SINEMET CR Take 1 tablet by mouth at bedtime.   carbidopa-levodopa 25-100 MG tablet Commonly known as: SINEMET IR Take 1 tablet by mouth 5 (five) times daily. 1.5 tab at 7 a.m., 1 tab at 10 a.m., 1 p.m.,  4 p.m. and 7 p.m.   clonazePAM 2 MG tablet Commonly known as: KLONOPIN Take 1 mg by mouth at bedtime.   feeding supplement Liqd Take 237 mLs by mouth 2 (two) times daily between meals.   Gemtesa 75 MG Tabs Generic drug: Vibegron Take 1 tablet (75 mg total) by mouth daily.   ketoconazole 2 % cream Commonly known as: NIZORAL Apply 1 application topically daily. Apply to flaky areas daily as needed   losartan 100 MG tablet Commonly known as: COZAAR Take 1 tablet (100 mg total) by mouth daily.   Melatonin 3 MG Caps Take 6 mg by mouth at bedtime as  needed (Sleep). Take 1-2 tablets by mouth 2 hours before bedtime   metoprolol tartrate 25 MG tablet Commonly known as: LOPRESSOR Take 25 mg by mouth 2 (two) times daily.   multivitamin with minerals Tabs tablet Take 1 tablet by mouth daily. Start taking on: January 08, 2023   NON FORMULARY Diet Type: Regular   omeprazole 40 MG capsule Commonly known as: PRILOSEC Take 40 mg by mouth daily.   rOPINIRole 0.5 MG tablet Commonly known as: REQUIP Take 0.5 mg by mouth 6 (six) times daily. What changed: Another medication with the same name was removed. Continue taking this medication, and follow the directions you see here.   tadalafil 20 MG tablet Commonly known as: CIALIS TAKE ONE TABLET BY MOUTH EVERY DAY   tamsulosin 0.4 MG Caps capsule Commonly known as: FLOMAX Take 1 capsule (0.4 mg total) by mouth daily.   testosterone cypionate 200 MG/ML injection Commonly known as: DEPOTESTOSTERONE CYPIONATE Inject 1 mL (200 mg total) into the muscle every 14 (fourteen) days.         Follow-up Information     Idelle Crouch, MD Follow up in 1 week(s).   Specialty: Internal Medicine Contact information: [place] [address] [phone]                 Allergies  Allergen Reactions   Promethazine Other (See Comments)    Ocular gyrus and light sensitivity and anxiousness. When he was going through chemo in 1984.   Metoclopramide Other (See Comments)    Pass out  Pass out. When he was going through chemo in 1984.   Phenergan [Promethazine Hcl] Other (See Comments)    Unknown      The results of significant diagnostics from this hospitalization (including imaging, microbiology, ancillary and laboratory) are listed below for reference.   Consultations:   Procedures/Studies: ECHOCARDIOGRAM COMPLETE  Result Date: 01/07/2023    ECHOCARDIOGRAM REPORT   Patient Name:   Charles Choi Date of Exam: 01/06/2023 Medical Rec #:   [id number]     Height:       68.0 in Accession #:    [id number]    Weight:       151.9 lb Date of Birth:  1950-12-02     BSA:          1.818 m Patient Age:    62 years      BP:           118/66 mmHg Patient Gender: M             HR:           82 bpm. Exam Location:  [facility] Procedure: 2D Echo, Cardiac Doppler and Color Doppler Indications:     Chest Pain  History:  Patient has prior history of Echocardiogram examinations, most                  recent 08/15/2016. CAD, Signs/Symptoms:Chest Pain; Risk                  Factors:Hypertension and Dyslipidemia. Parkinson's disease.  Sonographer:     Wenda Low Referring Phys:  [id number] Williams Diagnosing Phys: Neoma Laming  Sonographer Comments: Image acquisition challenging due to respiratory motion. IMPRESSIONS  1. Left ventricular ejection fraction, by estimation, is 60 to 65%. The left ventricle has normal function. The left ventricle has no regional wall motion abnormalities. There is mild left ventricular hypertrophy. Left ventricular diastolic parameters are consistent with Grade I diastolic dysfunction (impaired relaxation).  2. Right ventricular systolic function is normal. The right ventricular size is normal. There is normal pulmonary artery systolic pressure.  3. Left atrial size was mildly dilated.  4. The mitral valve is normal in structure. Trivial mitral valve regurgitation. No evidence of mitral stenosis.  5. The aortic valve is normal in structure. Aortic valve regurgitation is not visualized. Aortic valve sclerosis/calcification is present, without any evidence of aortic stenosis.  6. The inferior vena cava is normal in size with greater than 50% respiratory variability, suggesting right atrial pressure of 3 mmHg. FINDINGS  Left Ventricle: Left ventricular ejection fraction, by estimation, is 60 to 65%. The left ventricle has normal function. The left ventricle has no regional wall motion abnormalities. The left ventricular internal cavity size  was normal in size. There is  mild left ventricular hypertrophy. Left ventricular diastolic parameters are consistent with Grade I diastolic dysfunction (impaired relaxation). Right Ventricle: The right ventricular size is normal. No increase in right ventricular wall thickness. Right ventricular systolic function is normal. There is normal pulmonary artery systolic pressure. The tricuspid regurgitant velocity is 2.51 m/s, and  with an assumed right atrial pressure of 3 mmHg, the estimated right ventricular systolic pressure is [id number] mmHg. Left Atrium: Left atrial size was mildly dilated. Right Atrium: Right atrial size was normal in size. Pericardium: There is no evidence of pericardial effusion. Mitral Valve: The mitral valve is normal in structure. Trivial mitral valve regurgitation. No evidence of mitral valve stenosis. MV peak gradient, 4.9 mmHg. The mean mitral valve gradient is 2.0 mmHg. Tricuspid Valve: The tricuspid valve is normal in structure. Tricuspid valve regurgitation is trivial. No evidence of tricuspid stenosis. Aortic Valve: The aortic valve is normal in structure. Aortic valve regurgitation is not visualized. Aortic valve sclerosis/calcification is present, without any evidence of aortic stenosis. Aortic valve mean gradient measures 9.0 mmHg. Aortic valve peak  gradient measures 15.5 mmHg. Aortic valve area, by VTI measures 2.10 cm. Pulmonic Valve: The pulmonic valve was normal in structure. Pulmonic valve regurgitation is not visualized. No evidence of pulmonic stenosis. Aorta: The aortic root is normal in size and structure. Venous: The inferior vena cava is normal in size with greater than 50% respiratory variability, suggesting right atrial pressure of 3 mmHg. IAS/Shunts: No atrial level shunt detected by color flow Doppler.  LEFT VENTRICLE PLAX 2D LVIDd:         4.40 cm   Diastology LVIDs:         2.40 cm   LV e' medial:    7.40 cm/s LV PW:         1.20 cm   LV E/e' medial:  12.2 LV IVS:         1.30 cm   LV  e' lateral:   11.20 cm/s LVOT diam:     1.90 cm   LV E/e' lateral: 8.0 LV SV:         90 LV SV Index:   50 LVOT Area:     2.84 cm  RIGHT VENTRICLE RV Basal diam:  3.55 cm RV Mid diam:    2.90 cm RV S prime:     16.30 cm/s TAPSE (M-mode): 2.4 cm LEFT ATRIUM             Index        RIGHT ATRIUM           Index LA diam:        3.90 cm 2.14 cm/m   RA Area:     16.80 cm LA Vol (A2C):   66.9 ml 36.79 ml/m  RA Volume:   42.20 ml  23.21 ml/m LA Vol (A4C):   56.7 ml 31.18 ml/m LA Biplane Vol: 63.8 ml 35.09 ml/m  AORTIC VALVE                     PULMONIC VALVE AV Area (Vmax):    2.33 cm      PV Vmax:       1.14 m/s AV Area (Vmean):   2.38 cm      PV Peak grad:  5.2 mmHg AV Area (VTI):     2.10 cm AV Vmax:           197.00 cm/s AV Vmean:          136.000 cm/s AV VTI:            0.431 m AV Peak Grad:      15.5 mmHg AV Mean Grad:      9.0 mmHg LVOT Vmax:         162.00 cm/s LVOT Vmean:        114.000 cm/s LVOT VTI:          0.319 m LVOT/AV VTI ratio: 0.74  AORTA Ao Root diam: 3.30 cm MITRAL VALVE                TRICUSPID VALVE MV Area (PHT): 2.29 cm     TR Peak grad:   25.2 mmHg MV Area VTI:   2.78 cm     TR Vmax:        251.00 cm/s MV Peak grad:  4.9 mmHg MV Mean grad:  2.0 mmHg     SHUNTS MV Vmax:       1.11 m/s     Systemic VTI:  0.32 m MV Vmean:      64.6 cm/s    Systemic Diam: 1.90 cm MV Decel Time: 332 msec MV E velocity: 90.10 cm/s MV A velocity: 101.00 cm/s MV E/A ratio:  0.89 Shaukat Khan Electronically signed by Neoma Laming Signature Date/Time: 01/07/2023/9:37:18 AM    Final    DG Chest 2 View  Result Date: 01/05/2023 CLINICAL DATA:  CP EXAM: CHEST - 2 VIEW COMPARISON:  01/31/2021. FINDINGS: The heart size and mediastinal contours are within normal limits. Both lungs are clear. No pneumothorax or pleural effusion. Aorta calcified. There are thoracic degenerative changes. Right shoulder prosthesis. Left shoulder postop changes. Electronic stimulation wires extending to the neck. Lumbar  spine fixation hardware is partially imaged. IMPRESSION: No active cardiopulmonary disease. Electronically Signed   By: Sammie Bench M.D.   On: 01/05/2023 15:51   CT HEAD WO CONTRAST (5MM)  Result Date: 01/04/2023 CLINICAL DATA:  Weakness, progressive visual change. History of testicular and tongue cancer. EXAM: CT HEAD WITHOUT CONTRAST TECHNIQUE: Contiguous axial images were obtained from the base of the skull through the vertex without intravenous contrast. RADIATION DOSE REDUCTION: This exam was performed according to the departmental dose-optimization program which includes automated exposure control, adjustment of the mA and/or kV according to patient size and/or use of iterative reconstruction technique. COMPARISON:  None Available. FINDINGS: Brain: Bifrontal deep brain stimulator leads are in place with their tips in the expected location of the substantia nigra. Normal anatomic configuration of the brain. No acute intracranial hemorrhage or infarct. No abnormal mass effect or midline shift. No abnormal intra or extra-axial mass lesion or fluid collection. Vascular: No hyperdense vessel or unexpected calcification. Skull: No acute bone abnormality. Sinuses/Orbits: No acute finding. Other: None. IMPRESSION: 1. No acute intracranial abnormality. 2. Bifrontal deep brain stimulator leads in place with their tips in the expected location of the substantia nigra. Electronically Signed   By: Fidela Salisbury M.D.   On: 01/04/2023 18:10      Labs: BNP (last 3 results) No results for input(s): "BNP" in the last 8760 hours. Basic Metabolic Panel: Recent Labs  Lab 01/05/23 1351 01/06/23 0516  NA 140 139  K 3.8 3.8  CL 105 105  CO2 24 26  GLUCOSE 105* 96  BUN 38* 29*  CREATININE 1.27* 1.24  CALCIUM 9.4 9.0  MG  --  2.3  PHOS  --  3.5   Liver Function Tests: No results for input(s): "AST", "ALT", "ALKPHOS", "BILITOT", "PROT", "ALBUMIN" in the last 168 hours. No results for input(s):  "LIPASE", "AMYLASE" in the last 168 hours. No results for input(s): "AMMONIA" in the last 168 hours. CBC: Recent Labs  Lab 01/05/23 1351 01/06/23 0516  WBC 13.5* 8.1  HGB 13.7 12.1*  HCT 45.5 40.6  MCV 81.7 81.4  PLT 219 202   Cardiac Enzymes: No results for input(s): "CKTOTAL", "CKMB", "CKMBINDEX", "TROPONINI" in the last 168 hours. BNP: Invalid input(s): "POCBNP" CBG: No results for input(s): "GLUCAP" in the last 168 hours. D-Dimer No results for input(s): "DDIMER" in the last 72 hours. Hgb A1c No results for input(s): "HGBA1C" in the last 72 hours. Lipid Profile No results for input(s): "CHOL", "HDL", "LDLCALC", "TRIG", "CHOLHDL", "LDLDIRECT" in the last 72 hours. Thyroid function studies No results for input(s): "TSH", "T4TOTAL", "T3FREE", "THYROIDAB" in the last 72 hours.  Invalid input(s): "FREET3" Anemia work up No results for input(s): "VITAMINB12", "FOLATE", "FERRITIN", "TIBC", "IRON", "RETICCTPCT" in the last 72 hours. Urinalysis    Component Value Date/Time   COLORURINE YELLOW (A) 01/05/2023 1416   APPEARANCEUR HAZY (A) 01/05/2023 1416   APPEARANCEUR Clear 12/12/2022 1142   LABSPEC 1.016 01/05/2023 1416   PHURINE 5.0 01/05/2023 1416   GLUCOSEU NEGATIVE 01/05/2023 1416   HGBUR SMALL (A) 01/05/2023 1416   BILIRUBINUR NEGATIVE 01/05/2023 1416   BILIRUBINUR Negative 12/12/2022 1142   KETONESUR NEGATIVE 01/05/2023 1416   PROTEINUR 100 (A) 01/05/2023 1416   NITRITE NEGATIVE 01/05/2023 1416   LEUKOCYTESUR TRACE (A) 01/05/2023 1416   Sepsis Labs Recent Labs  Lab 01/05/23 1351 01/06/23 0516  WBC 13.5* 8.1   Microbiology Recent Results (from the past 240 hour(s))  Resp panel by RT-PCR (RSV, Flu A&B, Covid) Anterior Nasal Swab     Status: None   Collection Time: 01/05/23  4:02 PM   Specimen: Anterior Nasal Swab  Result Value Ref Range Status   SARS Coronavirus 2 by RT PCR NEGATIVE NEGATIVE Final  Comment: (NOTE) SARS-CoV-2 target nucleic acids are NOT  DETECTED.  The SARS-CoV-2 RNA is generally detectable in upper respiratory specimens during the acute phase of infection. The lowest concentration of SARS-CoV-2 viral copies this assay can detect is 138 copies/mL. A negative result does not preclude SARS-Cov-2 infection and should not be used as the sole basis for treatment or other patient management decisions. A negative result may occur with  improper specimen collection/handling, submission of specimen other than nasopharyngeal swab, presence of viral mutation(s) within the areas targeted by this assay, and inadequate number of viral copies(<138 copies/mL). A negative result must be combined with clinical observations, patient history, and epidemiological information. The expected result is Negative.  Fact Sheet for Patients:  EntrepreneurPulse.com.au  Fact Sheet for Healthcare Providers:  IncredibleEmployment.be  This test is no t yet approved or cleared by the [place] FDA and  has been authorized for detection and/or diagnosis of SARS-CoV-2 by FDA under an Emergency Use Authorization (EUA). This EUA will remain  in effect (meaning this test can be used) for the duration of the COVID-19 declaration under Section 564(b)(1) of the Act, 21 U.S.C.section 360bbb-3(b)(1), unless the authorization is terminated  or revoked sooner.       Influenza A by PCR NEGATIVE NEGATIVE Final   Influenza B by PCR NEGATIVE NEGATIVE Final    Comment: (NOTE) The Xpert Xpress SARS-CoV-2/FLU/RSV plus assay is intended as an aid in the diagnosis of influenza from Nasopharyngeal swab specimens and should not be used as a sole basis for treatment. Nasal washings and aspirates are unacceptable for Xpert Xpress SARS-CoV-2/FLU/RSV testing.  Fact Sheet for Patients: EntrepreneurPulse.com.au  Fact Sheet for Healthcare Providers: IncredibleEmployment.be  This test is not yet  approved or cleared by the [place] FDA and has been authorized for detection and/or diagnosis of SARS-CoV-2 by FDA under an Emergency Use Authorization (EUA). This EUA will remain in effect (meaning this test can be used) for the duration of the COVID-19 declaration under Section 564(b)(1) of the Act, 21 U.S.C. section 360bbb-3(b)(1), unless the authorization is terminated or revoked.     Resp Syncytial Virus by PCR NEGATIVE NEGATIVE Final    Comment: (NOTE) Fact Sheet for Patients: EntrepreneurPulse.com.au  Fact Sheet for Healthcare Providers: IncredibleEmployment.be  This test is not yet approved or cleared by the [place] FDA and has been authorized for detection and/or diagnosis of SARS-CoV-2 by FDA under an Emergency Use Authorization (EUA). This EUA will remain in effect (meaning this test can be used) for the duration of the COVID-19 declaration under Section 564(b)(1) of the Act, 21 U.S.C. section 360bbb-3(b)(1), unless the authorization is terminated or revoked.  Performed at [facility], [address] [place], [place] [address]      Total time spend on discharging this patient, including the last patient exam, discussing the hospital stay, instructions for ongoing care as it relates to all pertinent caregivers, as well as preparing the medical discharge records, prescriptions, and/or referrals as applicable, is 30 minutes.    Enzo Bi, MD  [facility] 01/07/2023, 11:18 AM

## 2023-01-07 NOTE — Progress Notes (Signed)
CSW spoke with patient and his brother Jan Roston, [phone]. Patient stated he will be discharging back to the independent living side today. Patients brother stated he will be staying with his brother to assist him. Patient stated he has home health services and [facility] provides physical therapy for him. CSW did explain patient needing a 3 night inpatient stay to qualify for SNF. Patient and brother understood and paying out of pocket for SNF is not an option.

## 2023-01-09 NOTE — Telephone Encounter (Signed)
Pt calls triage line and states that he cannot afford Gemtesa, the medication is $479.00 and is requesting an alternative. Informed pt that there are no comparable drug alternatives to Myrbetriq or Gemtesa. Explained that these medications are brand name only which contributes to cost. Also informed patient that he is not a candidate for several medications secondary his parkinson's disease. Advised pt that I would ask provider for any alternative medications. Patient given one month sample of Myrbetriq '50mg'$ .

## 2023-01-11 ENCOUNTER — Encounter: Payer: Self-pay | Admitting: Dermatology

## 2023-01-16 ENCOUNTER — Other Ambulatory Visit: Payer: Self-pay | Admitting: Family Medicine

## 2023-01-17 ENCOUNTER — Other Ambulatory Visit: Payer: Self-pay | Admitting: Urology

## 2023-01-18 MED ORDER — TROSPIUM CHLORIDE 20 MG PO TABS: 20.0000 mg | ORAL_TABLET | Freq: Two times a day (BID) | ORAL | 0 refills | Status: DC

## 2023-01-18 NOTE — Telephone Encounter (Signed)
Can give a trial of trospium 20 mg twice daily or 60 mg daily.  See which is less expensive on good Rx and let patient know he may need to use GoodRx instead of insurance

## 2023-01-18 NOTE — Addendum Note (Signed)
Addended by: Chrystie Nose on: 01/18/2023 03:52 PM   Modules accepted: Orders

## 2023-01-18 NOTE — Telephone Encounter (Signed)
Notified patient as instructed, patient will use goodrx. Medication sent

## 2023-02-01 ENCOUNTER — Other Ambulatory Visit: Payer: Self-pay | Admitting: Urology

## 2023-02-02 ENCOUNTER — Other Ambulatory Visit: Payer: Self-pay | Admitting: Urology

## 2023-02-06 ENCOUNTER — Other Ambulatory Visit: Payer: Medicare Other

## 2023-02-06 DIAGNOSIS — E291 Testicular hypofunction: Secondary | ICD-10-CM

## 2023-02-07 LAB — TESTOSTERONE: Testosterone: 264 ng/dL (ref 264–916)

## 2023-02-07 LAB — PSA: Prostate Specific Ag, Serum: 6.3 ng/mL — ABNORMAL HIGH (ref 0.0–4.0)

## 2023-02-07 LAB — HEMATOCRIT: Hematocrit: 47.1 % (ref 37.5–51.0)

## 2023-02-10 ENCOUNTER — Other Ambulatory Visit: Payer: Self-pay | Admitting: Urology

## 2023-02-10 ENCOUNTER — Ambulatory Visit: Payer: Medicare Other | Admitting: Urology

## 2023-02-11 ENCOUNTER — Encounter: Payer: Self-pay | Admitting: Urology

## 2023-02-13 ENCOUNTER — Other Ambulatory Visit: Payer: Self-pay | Admitting: Urology

## 2023-02-27 ENCOUNTER — Telehealth: Payer: Self-pay | Admitting: Urology

## 2023-03-07 ENCOUNTER — Other Ambulatory Visit: Payer: Medicare Other

## 2023-03-13 ENCOUNTER — Other Ambulatory Visit: Payer: Medicare Other

## 2023-03-23 ENCOUNTER — Other Ambulatory Visit: Payer: Medicare Other

## 2023-03-29 ENCOUNTER — Ambulatory Visit: Payer: Medicare Other | Admitting: Urology

## 2023-06-03 ENCOUNTER — Other Ambulatory Visit: Payer: Self-pay | Admitting: Urology

## 2023-07-13 ENCOUNTER — Ambulatory Visit (INDEPENDENT_AMBULATORY_CARE_PROVIDER_SITE_OTHER): Payer: Medicare Other | Admitting: Dermatology

## 2023-07-13 DIAGNOSIS — L720 Epidermal cyst: Secondary | ICD-10-CM

## 2023-07-13 DIAGNOSIS — L814 Other melanin hyperpigmentation: Secondary | ICD-10-CM

## 2023-07-13 DIAGNOSIS — L821 Other seborrheic keratosis: Secondary | ICD-10-CM

## 2023-07-13 DIAGNOSIS — D229 Melanocytic nevi, unspecified: Secondary | ICD-10-CM

## 2023-07-13 DIAGNOSIS — W908XXA Exposure to other nonionizing radiation, initial encounter: Secondary | ICD-10-CM

## 2023-07-13 DIAGNOSIS — L729 Follicular cyst of the skin and subcutaneous tissue, unspecified: Secondary | ICD-10-CM

## 2023-07-13 DIAGNOSIS — Z1283 Encounter for screening for malignant neoplasm of skin: Secondary | ICD-10-CM

## 2023-07-13 DIAGNOSIS — Z85828 Personal history of other malignant neoplasm of skin: Secondary | ICD-10-CM

## 2023-07-13 DIAGNOSIS — L219 Seborrheic dermatitis, unspecified: Secondary | ICD-10-CM

## 2023-07-13 DIAGNOSIS — Z7189 Other specified counseling: Secondary | ICD-10-CM

## 2023-07-13 DIAGNOSIS — Z8589 Personal history of malignant neoplasm of other organs and systems: Secondary | ICD-10-CM

## 2023-07-13 DIAGNOSIS — Z79899 Other long term (current) drug therapy: Secondary | ICD-10-CM

## 2023-07-13 DIAGNOSIS — D1801 Hemangioma of skin and subcutaneous tissue: Secondary | ICD-10-CM

## 2023-07-13 DIAGNOSIS — L578 Other skin changes due to chronic exposure to nonionizing radiation: Secondary | ICD-10-CM | POA: Diagnosis not present

## 2023-07-13 NOTE — Progress Notes (Signed)
Follow-Up Visit   Subjective  Charles Choi is a 72 y.o. male who presents for the following: Skin Cancer Screening and Full Body Skin Exam, hx of SCC, hx of BCC   The patient presents for Total-Body Skin Exam (TBSE) for skin cancer screening and mole check. The patient has spots, moles and lesions to be evaluated, some may be new or changing and the patient may have concern these could be cancer.    The following portions of the chart were reviewed this encounter and updated as appropriate: medications, allergies, medical history  Review of Systems:  No other skin or systemic complaints except as noted in HPI or Assessment and Plan.  Objective  Well appearing patient in no apparent distress; mood and affect are within normal limits.  A full examination was performed including scalp, head, eyes, ears, nose, lips, neck, chest, axillae, abdomen, back, buttocks, bilateral upper extremities, bilateral lower extremities, hands, feet, fingers, toes, fingernails, and toenails. All findings within normal limits unless otherwise noted below.   Relevant physical exam findings are noted in the Assessment and Plan.    Assessment & Plan   SKIN CANCER SCREENING PERFORMED TODAY.  ACTINIC DAMAGE - Chronic condition, secondary to cumulative UV/sun exposure - diffuse scaly erythematous macules with underlying dyspigmentation - Recommend daily broad spectrum sunscreen SPF 30+ to sun-exposed areas, reapply every 2 hours as needed.  - Staying in the shade or wearing long sleeves, sun glasses (UVA+UVB protection) and wide brim hats (4-inch brim around the entire circumference of the hat) are also recommended for sun protection.  - Call for new or changing lesions.  LENTIGINES, SEBORRHEIC KERATOSES, HEMANGIOMAS - Benign normal skin lesions - Benign-appearing - Call for any changes  MELANOCYTIC NEVI - Tan-brown and/or pink-flesh-colored symmetric macules and papules - Benign appearing on  exam today - Observation - Call clinic for new or changing moles - Recommend daily use of broad spectrum spf 30+ sunscreen to sun-exposed areas.   SEBORRHEIC DERMATITIS Exam: Pink patches with greasy scale at face Chronic and persistent condition with duration or expected duration over one year. Condition is symptomatic/ bothersome to patient. Not currently at goal.   Seborrheic Dermatitis is a chronic persistent rash characterized by pinkness and scaling most commonly of the mid face but also can occur on the scalp (dandruff), ears; mid chest, mid back and groin.  It tends to be exacerbated by stress and cooler weather.  People who have neurologic disease may experience new onset or exacerbation of existing seborrheic dermatitis.  The condition is not curable but treatable and can be controlled.  Treatment Plan: Re-Start ketoconazole. Apply to affected areas on Mondays, Wednesdays, Fridays.  And   HC 2.5. Apply to affected areas Tuesdays, Thursdays, and Saturdays.    EPIDERMAL INCLUSION CYST Exam: #2 ~ 1.2 cm Subcutaneous nodule at left back   Benign-appearing. Exam most consistent with an epidermal inclusion cyst. Discussed that a cyst is a benign growth that can grow over time and sometimes get irritated or inflamed. Recommend observation if it is not bothersome. Discussed option of surgical excision to remove it if it is growing, symptomatic, or other changes noted. Please call for new or changing lesions so they can be evaluated. Recommend surgery removal of the 2 cyst at 1 surgery appointment       HISTORY OF BASAL CELL CARCINOMA OF THE SKIN - No evidence of recurrence today - Recommend regular full body skin exams - Recommend daily broad spectrum sunscreen SPF  30+ to sun-exposed areas, reapply every 2 hours as needed.  - Call if any new or changing lesions are noted between office visits   HISTORY OF SQUAMOUS CELL CARCINOMA OF THE SKIN - No evidence of recurrence today - No  lymphadenopathy - Recommend regular full body skin exams - Recommend daily broad spectrum sunscreen SPF 30+ to sun-exposed areas, reapply every 2 hours as needed.  - Call if any new or changing lesions are noted between office visits     Return in about 1 year (around 07/12/2024) for TBSE and return for cyst excison .  IAngelique Holm, CMA, am acting as scribe for Armida Sans, MD .   Documentation: I have reviewed the above documentation for accuracy and completeness, and I agree with the above.  Armida Sans, MD

## 2023-07-13 NOTE — Patient Instructions (Addendum)

## 2023-07-15 ENCOUNTER — Encounter: Payer: Self-pay | Admitting: Dermatology

## 2023-07-19 ENCOUNTER — Other Ambulatory Visit: Payer: Medicare Other

## 2023-07-19 DIAGNOSIS — E291 Testicular hypofunction: Secondary | ICD-10-CM

## 2023-07-19 DIAGNOSIS — R972 Elevated prostate specific antigen [PSA]: Secondary | ICD-10-CM

## 2023-07-20 LAB — HEMATOCRIT: Hematocrit: 43.9 % (ref 37.5–51.0)

## 2023-07-20 LAB — PSA: Prostate Specific Ag, Serum: 4.6 ng/mL — ABNORMAL HIGH (ref 0.0–4.0)

## 2023-07-20 LAB — TESTOSTERONE: Testosterone: 298 ng/dL (ref 264–916)

## 2023-07-27 ENCOUNTER — Encounter: Payer: Self-pay | Admitting: Urology

## 2023-07-27 ENCOUNTER — Ambulatory Visit: Payer: Medicare Other | Admitting: Urology

## 2023-07-27 ENCOUNTER — Ambulatory Visit (INDEPENDENT_AMBULATORY_CARE_PROVIDER_SITE_OTHER): Payer: Medicare Other | Admitting: Urology

## 2023-07-27 VITALS — BP 119/67 | HR 82 | Ht 67.0 in | Wt 159.0 lb

## 2023-07-27 DIAGNOSIS — R972 Elevated prostate specific antigen [PSA]: Secondary | ICD-10-CM

## 2023-07-27 DIAGNOSIS — R399 Unspecified symptoms and signs involving the genitourinary system: Secondary | ICD-10-CM | POA: Diagnosis not present

## 2023-07-27 DIAGNOSIS — E291 Testicular hypofunction: Secondary | ICD-10-CM | POA: Diagnosis not present

## 2023-07-27 NOTE — Progress Notes (Signed)
I,[name],acting as a scribe for Charles Altes, MD.,have documented all relevant documentation on the behalf of Charles Altes, MD,as directed by  Charles Altes, MD while in the presence of Charles Altes, MD.  07/27/2023 12:23 PM   Charles Choi May 01, 1951 [id number]  Referring provider: Marguarite Arbour, MD [address] [facility] [place],  [place] [address]  Chief Complaint  Patient presents with   Urinary Frequency   Urologic history:  1.  Hypogonadism Diagnosed 2019; symptoms tiredness, fatigue, decreased libido History seminoma s/p orchiectomy Testosterone cypionate injections  2.  Nephrolithiasis History right ureteral calculus-passed CT 09/2022 showed no urinary tract calculi  3.  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 male presents today for routine follow-up.  Was most recently seen for worsening frequency, urgency, and urge incontinence secondary to Parkinson's. He saw improvement on Myrbetriq and Gemtesa, however was unable to afford. Presently taking Tamsulosin and Trospium 20 mg twice daily.  Noted significant improvement in his storage related voiding symptoms with the addition of Trospium and is currently satisfied with his voiding pattern. PSA checked by [facility] on 12/22/2022 was 12.67. It was repeated on 02/06/2023 and had decreased to 6.3 Labs drawn 07/19/2023 with testosterone 298, hematocrit 43.9, and PSA has decreased to 4.6. No flank, abdominal, or pelvic pain. Denies gross hematuria.   PMH: Past Medical History:  Diagnosis Date   Anxiety    Basal cell carcinoma 09/04/2017   left infrascapular mid back   Basal cell carcinoma 09/28/2015   left low back 11 cm lat to spinal scar   Basal cell carcinoma 09/28/2015   left posterior waistline 5.5 cm lateral to spinal scar   Basal cell carcinoma  07/06/2015   left medial low back   Basal cell carcinoma 07/06/2015   left low back lateral   Basal cell carcinoma 05/06/2015   right lower back   Basal cell carcinoma 09/16/2021   right medial mid chest, [facility] 12/01/21   Basal cell carcinoma 09/16/2021   left mid medial back, [facility] 12/01/21   Basal cell carcinoma 09/16/2021   right mid lower back   Basal cell carcinoma 04/06/2022   low back spinal above sacral - [facility]   Basal cell carcinoma 04/06/2022   Right post waistline - [facility]   Cancer (HCC)    Depression    Hypertension    Parkinson's disease    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 When he was going through chemo in 1984.   Metoclopramide Other (See Comments)   Pass out Pass out. When he was going through chemo in 1984.   Phenergan [promethazine Hcl] Other (See Comments)   Unknown         Medication List  Accurate as of July 27, 2023 12:23 PM. If you have any questions, ask your nurse or doctor.          acetaminophen 325 MG tablet Commonly known as: TYLENOL Take 925 mg by mouth every 6 (six) hours as needed for mild pain or moderate pain.   aspirin EC 81 MG tablet Take 81 mg by mouth daily. What changed: Another medication with the same name was removed. Continue taking this medication, and follow the directions you see here. Changed by: Charles Choi   atorvastatin 40 MG  tablet Commonly known as: LIPITOR Take 40 mg by mouth daily. What changed: Another medication with the same name was removed. Continue taking this medication, and follow the directions you see here. Changed by: Charles Choi   Carbidopa-Levodopa ER 25-100 MG tablet controlled release Commonly known as: SINEMET CR Take 1 tablet by mouth at bedtime.   carbidopa-levodopa 25-100 MG tablet Commonly known as: SINEMET IR Take 1 tablet by mouth 5 (five) times daily. 1.5 tab at 7 a.m., 1 tab at 10 a.m., 1 p.m.,  4 p.m. and 7 p.m.   clonazePAM 2 MG tablet Commonly known as: KLONOPIN Take 1 mg by mouth at bedtime.   ezetimibe 10 MG tablet Commonly known as: ZETIA Take 10 mg by mouth daily.   feeding supplement Liqd Take 237 mLs by mouth 2 (two) times daily between meals.   imipramine 25 MG tablet Commonly known as: TOFRANIL Take 25 mg by mouth at bedtime.   ketoconazole 2 % cream Commonly known as: NIZORAL Apply 1 application topically daily. Apply to flaky areas daily as needed   losartan 100 MG tablet Commonly known as: COZAAR Take 1 tablet (100 mg total) by mouth daily.   Melatonin 3 MG Caps Take 6 mg by mouth at bedtime as needed (Sleep). Take 1-2 tablets by mouth 2 hours before bedtime   meloxicam 7.5 MG tablet Commonly known as: MOBIC Take 7.5 mg by mouth daily.   metoprolol tartrate 25 MG tablet Commonly known as: LOPRESSOR Take 25 mg by mouth 2 (two) times daily.   multivitamin with minerals Tabs tablet Take 1 tablet by mouth daily.   NON FORMULARY Diet Type: Regular   omeprazole 40 MG capsule Commonly known as: PRILOSEC Take 40 mg by mouth daily.   rOPINIRole 0.5 MG tablet Commonly known as: REQUIP Take 0.5 mg by mouth 6 (six) times daily.   tadalafil 20 MG tablet Commonly known as: CIALIS TAKE ONE TABLET BY MOUTH EVERY DAY   tamsulosin 0.4 MG Caps capsule Commonly known as: FLOMAX TAKE 1 CAPSULE BY MOUTH ONCE DAILY   testosterone cypionate 200  MG/ML injection Commonly known as: DEPOTESTOSTERONE CYPIONATE INJECT INTO THE MUSCLE EVERY 14 DAYSAS DIRECTED.   trospium 20 MG tablet Commonly known as: SANCTURA TAKE ONE TABLET BY MOUTH TWICE DAILY   venlafaxine XR 75 MG 24 hr capsule Commonly known as: EFFEXOR-XR Take 75 mg by mouth daily.        Allergies:  Allergies  Allergen Reactions   Promethazine Other (See Comments)    Ocular gyrus and light sensitivity and anxiousness. When he was going through chemo in 1984.   Metoclopramide Other (See Comments)    Pass out  Pass out. When he was going through chemo in 1984.   Phenergan [Promethazine Hcl] Other (See Comments)    Unknown     Family History: Family History  Problem Relation Age of Onset   Hypertension Mother  Hypertension Father    Hypertension Sister    Hypertension Brother    Heart disease Brother     Social History:  reports that he has never smoked. He has never used smokeless tobacco. He reports current alcohol use. He reports current drug use. Drug: Marijuana.   Physical Exam: BP 119/67 (BP Location: Left Arm, Patient Position: Sitting, Cuff Size: Normal)   Pulse 82   Ht 5\' 7"  (1.702 m)   Wt 159 lb (72.1 kg)   BMI 24.90 kg/m   Constitutional:  Alert and oriented, No acute distress. HEENT: [place] AT Respiratory: Normal respiratory effort, no increased work of breathing. Psychiatric: Normal mood and affect.   Assessment & Plan:    1.  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 Significant improvement with addition of Trospium which he will continue. Continue Tamsulosin.  2. Elevated PSA His PSA is decreasing and will continue to follow up with repeat PSA in 3 months.  3. Hypogonadism Testosterone level load normal. He will start to receive his testosterone injections at his assisted living facility. Scheduled 6 month lab visit for testosterone, PSA,  hematocrit, and annual follow-up.   I have reviewed the above documentation for accuracy and completeness, and I agree with the above.   Charles Altes, MD  [facility] [address], [address] [place], [place] [address] [phone]

## 2023-07-27 NOTE — Patient Instructions (Signed)
Please have [facility] draw PSA in 3 months, we will call with those results

## 2023-07-30 ENCOUNTER — Encounter: Payer: Self-pay | Admitting: Urology

## 2023-08-01 ENCOUNTER — Encounter: Payer: Medicare Other | Admitting: Dermatology

## 2023-08-08 ENCOUNTER — Encounter: Payer: Self-pay | Admitting: Dermatology

## 2023-08-08 ENCOUNTER — Ambulatory Visit: Payer: Medicare Other | Admitting: Dermatology

## 2023-08-08 VITALS — BP 131/76

## 2023-08-08 DIAGNOSIS — L720 Epidermal cyst: Secondary | ICD-10-CM

## 2023-08-08 DIAGNOSIS — D492 Neoplasm of unspecified behavior of bone, soft tissue, and skin: Secondary | ICD-10-CM

## 2023-08-08 MED ORDER — MUPIROCIN 2 % EX OINT: 1.0000 | TOPICAL_OINTMENT | Freq: Every day | CUTANEOUS | 1 refills | Status: AC

## 2023-08-08 NOTE — Patient Instructions (Signed)
Wound Care Instructions  On the day following your surgery, you should begin doing daily dressing changes: Remove the old dressing and discard it. Cleanse the wound gently with tap water. This may be done in the shower or by placing a wet gauze pad directly on the wound and letting it soak for several minutes. It is important to gently remove any dried blood from the wound in order to encourage healing. This may be done by gently rolling a moistened Q-tip on the dried blood. Do not pick at the wound. If the wound should start to bleed, continue cleaning the wound, then place a moist gauze pad on the wound and hold pressure for a few minutes.  Make sure you then dry the skin surrounding the wound completely or the tape will not stick to the skin. Do not use cotton balls on the wound. After the wound is clean and dry, apply the ointment gently with a Q-tip. Cut a non-stick pad to fit the size of the wound. Lay the pad flush to the wound. If the wound is draining, you may want to reinforce it with a small amount of gauze on top of the non-stick pad for a little added compression to the area. Use the tape to seal the area completely. Select from the following with respect to your individual situation: If your wound has been stitched closed: continue the above steps 1-8 at least daily until your sutures are removed. If your wound has been left open to heal: continue steps 1-8 at least daily for the first 3-4 weeks. We would like for you to take a few extra precautions for at least the next week. Sleep with your head elevated on pillows if our wound is on your head. Do not bend over or lift heavy items to reduce the chance of elevated blood pressure to the wound Do not participate in particularly strenuous activities.   Below is a list of dressing supplies you might need.  Cotton-tipped applicators - Q-tips Gauze pads (2x2 and/or 4x4) - All-Purpose Sponges Non-stick dressing material - Telfa Tape -  Paper or Hypafix New and clean tube of petroleum jelly - Vaseline    Comments on Post-Operative Period Slight swelling and redness often appear around the wound. This is normal and will disappear within several days following the surgery. The healing wound will drain a brownish-red-yellow discharge during healing. This is a normal phase of wound healing. As the wound begins to heal, the drainage may increase in amount. Again, this drainage is normal. Notify us if the drainage becomes persistently bloody, excessively swollen, or intensely painful or develops a foul odor or red streaks.  If you should experience mild discomfort during the healing phase, you may take an aspirin-free medication such as Tylenol (acetaminophen). Notify us if the discomfort is severe or persistent. Avoid alcoholic beverages when taking pain medicine.  In Case of Wound Hemorrhage A wound hemorrhage is when the bandage suddenly becomes soaked with bright red blood and flows profusely. If this happens, sit down or lie down with your head elevated. If the wound has a dressing on it, do not remove the dressing. Apply pressure to the existing gauze. If the wound is not covered, use a gauze pad to apply pressure and continue applying the pressure for 20 minutes without peeking. DO NOT COVER THE WOUND WITH A LARGE TOWEL OR WASH CLOTH. Release your hand from the wound site but do not remove the dressing. If the bleeding has stopped,   gently clean around the wound. Leave the dressing in place for 24 hours if possible. This wait time allows the blood vessels to close off so that you do not spark a new round of bleeding by disrupting the newly clotted blood vessels with an immediate dressing change. If the bleeding does not subside, continue to hold pressure. If matters are out of your control, contact an After Hours clinic or go to the Emergency Room.

## 2023-08-08 NOTE — Progress Notes (Signed)
Follow-Up Visit   Subjective  Charles Choi is a 72 y.o. male who presents for the following: Cyst x 2 L back, pt presents for excision The patient has spots, moles and lesions to be evaluated, some may be new or changing and the patient may have concern these could be cancer.   The following portions of the chart were reviewed this encounter and updated as appropriate: medications, allergies, medical history  Review of Systems:  No other skin or systemic complaints except as noted in HPI or Assessment and Plan.  Objective  Well appearing patient in no apparent distress; mood and affect are within normal limits.   A focused examination was performed of the following areas: back  Relevant exam findings are noted in the Assessment and Plan.  Left mid back medial Cystic pap 2.2cm  Left mid back lateral Cystic pap 2.2cm    Assessment & Plan     Neoplasm of skin (2) Left mid back medial  Skin excision  Lesion length (cm):  2.2 Lesion width (cm):  2.2 Margin per side (cm):  0 Total excision diameter (cm):  2.2 Informed consent: discussed and consent obtained   Timeout: patient name, date of birth, surgical site, and procedure verified   Procedure prep:  Patient was prepped and draped in usual sterile fashion Prep type:  Isopropyl alcohol and povidone-iodine Anesthesia: the lesion was anesthetized in a standard fashion   Anesthetic:  1% lidocaine w/ epinephrine 1-100,000 buffered w/ 8.4% NaHCO3 (6cc lido w/ epi, 1.5cc bupivicaine, Total of 7.5cc) Instrument used: #15 blade   Hemostasis achieved with: pressure   Hemostasis achieved with comment:  Electrocautery Outcome: patient tolerated procedure well with no complications   Post-procedure details: sterile dressing applied and wound care instructions given   Dressing type: bandage and pressure dressing (Mupirocin)    Skin repair Complexity:  Complex Final length (cm):  3 Reason for type of repair: reduce  tension to allow closure, reduce the risk of dehiscence, infection, and necrosis, reduce subcutaneous dead space and avoid a hematoma, allow closure of the large defect, preserve normal anatomy, preserve normal anatomical and functional relationships and enhance both functionality and cosmetic results   Undermining: area extensively undermined   Undermining comment:  Undermining Defect 2.2cm Subcutaneous layers (deep stitches):  Suture size:  2-0 Suture type: Vicryl (polyglactin 910)   Subcutaneous suture technique: Inverted Dermal. Fine/surface layer approximation (top stitches):  Suture size:  2-0 Suture type: nylon   Stitches: horizontal mattress and simple interrupted   Stitches comment:  Horizontal mattress x 1, simple interrupted x 2 Suture removal (days):  7 Hemostasis achieved with: pressure Outcome: patient tolerated procedure well with no complications   Post-procedure details: sterile dressing applied and wound care instructions given   Dressing type: bandage, pressure dressing and bacitracin (Mupirocin)    mupirocin ointment (BACTROBAN) 2 % Apply 1 Application topically daily. Qd to excision site  Specimen 1 - Surgical pathology Differential Diagnosis: D48.5 Cyst vs other  Check Margins: No Cystic pap 2.2cm  Left mid back lateral  Skin excision  Lesion length (cm):  2.2 Lesion width (cm):  2.2 Margin per side (cm):  0 Total excision diameter (cm):  2.2 Informed consent: discussed and consent obtained   Timeout: patient name, date of birth, surgical site, and procedure verified   Procedure prep:  Patient was prepped and draped in usual sterile fashion Prep type:  Isopropyl alcohol and povidone-iodine Anesthesia: the lesion was anesthetized in a standard fashion  Anesthetic:  1% lidocaine w/ epinephrine 1-100,000 buffered w/ 8.4% NaHCO3 (6cc lido w/ epi, 1.5cc bupivicaine, Total of 7.5cc) Instrument used: #15 blade   Hemostasis achieved with: pressure    Hemostasis achieved with comment:  Electrocautery Outcome: patient tolerated procedure well with no complications   Post-procedure details: sterile dressing applied and wound care instructions given   Dressing type: bandage and pressure dressing (Mupirocin)    Skin repair Complexity:  Complex Final length (cm):  3 Reason for type of repair: reduce tension to allow closure, reduce the risk of dehiscence, infection, and necrosis, reduce subcutaneous dead space and avoid a hematoma, allow closure of the large defect, preserve normal anatomy, preserve normal anatomical and functional relationships and enhance both functionality and cosmetic results   Undermining: area extensively undermined   Undermining comment:  Undermining Defect 2.2cm Subcutaneous layers (deep stitches):  Suture size:  2-0 Suture type: Vicryl (polyglactin 910)   Subcutaneous suture technique: Inverted Dermal. Fine/surface layer approximation (top stitches):  Suture size:  2-0 Suture type: nylon   Stitches: horizontal mattress   Stitches comment:  X 2 Suture removal (days):  7 Hemostasis achieved with: pressure Outcome: patient tolerated procedure well with no complications   Post-procedure details: sterile dressing applied and wound care instructions given   Dressing type: bandage, pressure dressing and bacitracin (Mupirocin)    Specimen 2 - Surgical pathology Differential Diagnosis: D48.5 Cystic vs other  Check Margins: No Cystic pap 2.2cm  Cyst vs other, excised today Start Mupirocin oint qd to excision site    Return in about 1 week (around 08/15/2023) for suture removal.  I, Sonya Hupman, RMA, am acting as scribe for Armida Sans, MD .   Documentation: I have reviewed the above documentation for accuracy and completeness, and I agree with the above.  Armida Sans, MD

## 2023-08-10 LAB — SURGICAL PATHOLOGY

## 2023-08-15 ENCOUNTER — Encounter: Payer: Self-pay | Admitting: Dermatology

## 2023-08-15 ENCOUNTER — Ambulatory Visit (INDEPENDENT_AMBULATORY_CARE_PROVIDER_SITE_OTHER): Payer: Medicare Other | Admitting: Dermatology

## 2023-08-15 DIAGNOSIS — L729 Follicular cyst of the skin and subcutaneous tissue, unspecified: Secondary | ICD-10-CM

## 2023-08-15 DIAGNOSIS — L720 Epidermal cyst: Secondary | ICD-10-CM

## 2023-08-15 NOTE — Patient Instructions (Signed)

## 2023-08-15 NOTE — Progress Notes (Signed)
   Follow-Up Visit   Subjective  Julie Paolini is a 72 y.o. male who presents for the following: Suture removal left mid back medial   Pathology showed EPIDERMOID CYST, INFLAMED  at left mid back medial   The following portions of the chart were reviewed this encounter and updated as appropriate: medications, allergies, medical history  Review of Systems:  No other skin or systemic complaints except as noted in HPI or Assessment and Plan.  Objective  Well appearing patient in no apparent distress; mood and affect are within normal limits.  Areas Examined: back Relevant physical exam findings are noted in the Assessment and Plan.    Assessment & Plan    Encounter for Removal of Sutures - Incision site is clean, dry and intact. - Wound cleansed, sutures removed, wound cleansed and steri strips applied.  - Discussed pathology results showing EPIDERMOID CYST, INFLAMED  at left mid back medial  - Patient advised to keep steri-strips dry until they fall off. - Scars remodel for a full year. - Once steri-strips fall off, patient can apply over-the-counter silicone scar cream once to twice a day to help with scar remodeling if desired. - Patient advised to call with any concerns or if they notice any new or changing lesions.  Return for keep follow up as scheduled .  IAsher Muir, CMA, am acting as scribe for Armida Sans, MD.   Documentation: I have reviewed the above documentation for accuracy and completeness, and I agree with the above.  Armida Sans, MD

## 2023-09-25 ENCOUNTER — Other Ambulatory Visit: Payer: Self-pay | Admitting: Urology

## 2023-09-29 ENCOUNTER — Other Ambulatory Visit: Payer: Self-pay | Admitting: Urology

## 2023-10-23 ENCOUNTER — Other Ambulatory Visit: Payer: Self-pay | Admitting: Urology

## 2023-12-20 NOTE — Progress Notes (Signed)
This encounter was created in error - please disregard.

## 2024-01-23 LAB — COLOGUARD: COLOGUARD: NEGATIVE

## 2024-01-23 LAB — EXTERNAL GENERIC LAB PROCEDURE: COLOGUARD: NEGATIVE

## 2024-01-26 ENCOUNTER — Other Ambulatory Visit: Payer: Self-pay | Admitting: Urology

## 2024-01-26 NOTE — Telephone Encounter (Signed)
 Left message with Wyatt Mage at the [facility] to send [place] a copy of PSA results that patient was suppose to have done back in 10/2023 or 11/2023, and if this was not done to let us know. Patient is also due for 6 months lab appointment now and needs a yearly appointment in 07/2024

## 2024-02-08 ENCOUNTER — Other Ambulatory Visit: Payer: Self-pay | Admitting: Physician Assistant

## 2024-02-08 DIAGNOSIS — R1310 Dysphagia, unspecified: Secondary | ICD-10-CM

## 2024-02-08 DIAGNOSIS — G20A1 Parkinson's disease without dyskinesia, without mention of fluctuations: Secondary | ICD-10-CM

## 2024-02-08 DIAGNOSIS — I10 Essential (primary) hypertension: Secondary | ICD-10-CM

## 2024-02-09 ENCOUNTER — Other Ambulatory Visit: Payer: Self-pay | Admitting: Urology

## 2024-02-15 ENCOUNTER — Ambulatory Visit
Admission: RE | Admit: 2024-02-15 | Discharge: 2024-02-15 | Disposition: A | Discharge status: Home / Self Care | Source: Ambulatory Visit | Attending: Physician Assistant | Admitting: Physician Assistant

## 2024-02-15 DIAGNOSIS — G20A1 Parkinson's disease without dyskinesia, without mention of fluctuations: Secondary | ICD-10-CM | POA: Insufficient documentation

## 2024-02-15 DIAGNOSIS — R1310 Dysphagia, unspecified: Secondary | ICD-10-CM | POA: Diagnosis present

## 2024-02-15 DIAGNOSIS — I10 Essential (primary) hypertension: Secondary | ICD-10-CM | POA: Insufficient documentation

## 2024-02-15 NOTE — Therapy (Signed)
 Modified Barium Swallow Study  Patient Details  Name: Charles Choi MRN: [id number] Date of Birth: 01-06-1951  Today's Date: 02/15/2024  Modified Barium Swallow completed.  Full report located under Chart Review in the Imaging Section.  History of Present Illness Charles Choi is a 73 y.o. male who presentes for MBSS. He has a PMH of Parkinson's, CAD, HTN, HLD, GERD, anxiety/depression,history of reverse right shoulder placement, and recent appendectomy last month who reports 2-3 occasions of choking on food, mainly solids.   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 Pt with prolonged mastication, oral residual, piecemeal swallow, and repetitive lingual motion. Pt also with difficulty with oral containment/posterior lingual control with premature spillage of thin liquids. Oral deficits likely due to lingual weakness and incoordination and exacerbated by xerostomia and dental status. Pharyngeally, pt with swallow initiation at the level of the vallecula/pyriform sinus with reduced base of tongue retraction and reduced/mistimed laryngeal vestibule closure. As a result, pt with moderate pharyngeal stasis, most appreciated with solid/puree, and before/during the swallow laryngeal penetration which is oftern transient in nature. Esophageal screening remarkable for retention of barium with retrograde flow below the PES. Liquid was was effecitve in reducing oral, pharyngeal, and esophageal residuals.   Recommend continuation of a mech soft diet with thin liquids with safe swallowing strategies/aspiration precautions/reflux precautions including upright positioning during and 60-90 minutes after meals, thorough mastication of  solids, alternatation of bites/sips, and use liquid wash to help aid in oral, pharyngeal, and esophageal clearance.   Pt participated in skilled treatment and education including role of SLP, results of  assessment, diet recommendations, safe swallowing/aspiration/reflux precautions, age-related vs possibly Parkinson's related changes to swallow function,  3 phases of swallowing, and SLP POC. Pt shown videofluoroscopic images for reinforcement of content.  Factors that may increase risk of adverse event in presence of aspiration [employer]ye [employer]aks [employer] Clearance Coots 2021):  GI disease; Parkinson's Disease; hx of lingual cancer  Swallow Evaluation Recommendations Recommendations: PO diet PO Diet Recommendation: Dysphagia 3 (Mechanical soft);Thin liquids (Level 0) Liquid Administration via: Spoon;Cup;Straw Medication Administration:  (as tolerated) Supervision: Patient able to self-feed Swallowing strategies  : Slow rate;Small bites/sips;Follow solids with liquids Postural changes: Position pt fully upright for meals;Out of bed for meals (upright 60-90 minutes after meals) Oral care recommendations: Oral care BID (2x/day) Recommended consults: Consider GI consultation;Consider esophageal assessment    Clyde Canterbury, M.S., CCC-SLP Speech-Language Pathologist [facility] [phone] (ASCOM)   Alessandra Bevels Cadon Raczka 02/15/2024,3:00 PM

## 2024-02-27 ENCOUNTER — Other Ambulatory Visit: Payer: Self-pay | Admitting: Urology

## 2024-07-17 ENCOUNTER — Ambulatory Visit: Payer: Medicare Other | Admitting: Dermatology

## 2024-07-17 ENCOUNTER — Encounter: Payer: Self-pay | Admitting: Dermatology

## 2024-07-17 DIAGNOSIS — D229 Melanocytic nevi, unspecified: Secondary | ICD-10-CM

## 2024-07-17 DIAGNOSIS — B079 Viral wart, unspecified: Secondary | ICD-10-CM

## 2024-07-17 DIAGNOSIS — L820 Inflamed seborrheic keratosis: Secondary | ICD-10-CM | POA: Diagnosis not present

## 2024-07-17 DIAGNOSIS — W908XXA Exposure to other nonionizing radiation, initial encounter: Secondary | ICD-10-CM

## 2024-07-17 DIAGNOSIS — L814 Other melanin hyperpigmentation: Secondary | ICD-10-CM | POA: Diagnosis not present

## 2024-07-17 DIAGNOSIS — Z1283 Encounter for screening for malignant neoplasm of skin: Secondary | ICD-10-CM | POA: Diagnosis not present

## 2024-07-17 DIAGNOSIS — D1801 Hemangioma of skin and subcutaneous tissue: Secondary | ICD-10-CM

## 2024-07-17 DIAGNOSIS — L719 Rosacea, unspecified: Secondary | ICD-10-CM

## 2024-07-17 DIAGNOSIS — L821 Other seborrheic keratosis: Secondary | ICD-10-CM | POA: Diagnosis not present

## 2024-07-17 DIAGNOSIS — L570 Actinic keratosis: Secondary | ICD-10-CM | POA: Diagnosis not present

## 2024-07-17 DIAGNOSIS — Z85828 Personal history of other malignant neoplasm of skin: Secondary | ICD-10-CM

## 2024-07-17 DIAGNOSIS — L578 Other skin changes due to chronic exposure to nonionizing radiation: Secondary | ICD-10-CM

## 2024-07-17 DIAGNOSIS — L729 Follicular cyst of the skin and subcutaneous tissue, unspecified: Secondary | ICD-10-CM

## 2024-07-17 DIAGNOSIS — Z7189 Other specified counseling: Secondary | ICD-10-CM

## 2024-07-17 DIAGNOSIS — Z79899 Other long term (current) drug therapy: Secondary | ICD-10-CM

## 2024-07-17 DIAGNOSIS — L720 Epidermal cyst: Secondary | ICD-10-CM

## 2024-07-17 DIAGNOSIS — Z8589 Personal history of malignant neoplasm of other organs and systems: Secondary | ICD-10-CM

## 2024-07-17 DIAGNOSIS — L219 Seborrheic dermatitis, unspecified: Secondary | ICD-10-CM

## 2024-07-17 MED ORDER — KETOCONAZOLE 2 % EX CREA: 1.0000 | TOPICAL_CREAM | Freq: Every day | CUTANEOUS | 6 refills | Status: AC

## 2024-07-17 MED ORDER — HYDROCORTISONE 2.5 % EX CREA: TOPICAL_CREAM | CUTANEOUS | 11 refills | Status: AC

## 2024-07-17 NOTE — Patient Instructions (Addendum)
 Pre-Operative Instructions You are scheduled for a surgical procedure at [facility]. We recommend you read the following instructions. If you have any questions or concerns, please call the office at [phone].  Shower and wash the entire body with soap and water the day of your surgery paying special attention to cleansing at and around the planned surgery site.  Please continue to take your anticoagulants (blood thinners) as you normally     would before and after surgery if they were prescribed by a medical provider. Stopping them could be harmful to you. We have multiple tools in dermatology to stop the bleeding even if you take an anticoagulant. If you take over the counter blood thinner such as aspirin, Ibuprofen (Motrin, Advil and Nuprin), Naprosyn , Voltaren , Relafen, etc. that was not prescribed or recommended by a medical provider, we recommend that you stop taking it for a week before your surgery and wait to restart until 2 days after your surgery.  Please inform us  of all medications you are currently taking. All medications that are taken regularly should be taken the day of surgery as you always do. Nevertheless, we need to be informed of what medications you are taking prior to surgery to know whether they will affect the procedure or cause any complications.   Please inform us  of any medication allergies. Also inform us  of whether you have allergies to Latex or rubber products or whether you have had any adverse reaction to Lidocaine  or Epinephrine .  Please inform us  of any prosthetic or artificial body parts such as artificial heart valve, joint replacements, etc., or similar condition that might require preoperative antibiotics.   We recommend avoidance of alcohol at least two weeks prior to surgery and continued avoidance for at least two weeks after surgery.   We recommend discontinuation of tobacco smoking at least two weeks prior to surgery and continued  abstinence for at least two weeks after surgery.  Do not plan strenuous exercise, strenuous work or strenuous lifting for approximately four weeks after your surgery.   We request if you are unable to make your scheduled surgical appointment, please call us  at least a week in advance or as soon as you are aware of a problem so that we can cancel or reschedule the appointment.   You MAY TAKE TYLENOL  (acetaminophen ) for pain as it is not a blood thinner.   PLEASE PLAN TO BE IN TOWN FOR TWO WEEKS FOLLOWING SURGERY, THIS IS IMPORTANT SO YOU CAN BE CHECKED FOR DRESSING CHANGES, FUTURE REMOVAL AND TO MONITOR FOR POSSIBLE COMPLICATIONS.   Due to recent changes in healthcare laws, you may see results of your pathology and/or laboratory studies on MyChart before the doctors have had a chance to review them. We understand that in some cases there may be results that are confusing or concerning to you. Please understand that not all results are received at the same time and often the doctors may need to interpret multiple results in order to provide you with the best plan of care or course of treatment. Therefore, we ask that you please give us  2 business days to thoroughly review all your results before contacting the office for clarification. Should we see a critical lab result, you will be contacted sooner.   If You Need Anything After Your Visit  If you have any questions or concerns for your doctor, please call our main line at [phone] and press option 4 to reach your doctor's medical assistant. If  no one answers, please leave a voicemail as directed and we will return your call as soon as possible. Messages left after 4 pm will be answered the following business day.   You may also send us  a message via MyChart. We typically respond to MyChart messages within 1-2 business days.  For prescription refills, please ask your pharmacy to contact our office. Our fax number is [phone].  If you have  an urgent issue when the clinic is closed that cannot wait until the next business day, you can page your doctor at the number below.    Please note that while we do our best to be available for urgent issues outside of office hours, we are not available 24/7.   If you have an urgent issue and are unable to reach us , you may choose to seek medical care at your doctor's office, retail clinic, urgent care center, or emergency room.  If you have a medical emergency, please immediately call 911 or go to the emergency department.  Pager Numbers  - Dr. Hester: [phone]  - Dr. Jackquline: [phone]  - Dr. Claudene: [phone]   - Dr. Raymund: [phone]  In the event of inclement weather, please call our main line at [phone] for an update on the status of any delays or closures.  Dermatology Medication Tips: Please keep the boxes that topical medications come in in order to help keep track of the instructions about where and how to use these. Pharmacies typically print the medication instructions only on the boxes and not directly on the medication tubes.   If your medication is too expensive, please contact our office at [phone] option 4 or send us  a message through MyChart.   We are unable to tell what your co-pay for medications will be in advance as this is different depending on your insurance coverage. However, we may be able to find a substitute medication at lower cost or fill out paperwork to get insurance to cover a needed medication.   If a prior authorization is required to get your medication covered by your insurance company, please allow us  1-2 business days to complete this process.  Drug prices often vary depending on where the prescription is filled and some pharmacies may offer cheaper prices.  The website www.goodrx.com contains coupons for medications through different pharmacies. The prices here do not account for what the cost may be with help from  insurance (it may be cheaper with your insurance), but the website can give you the price if you did not use any insurance.  - You can print the associated coupon and take it with your prescription to the pharmacy.  - You may also stop by our office during regular business hours and pick up a GoodRx coupon card.  - If you need your prescription sent electronically to a different pharmacy, notify our office through [facility] or by phone at [phone] option 4.     Si Usted Necesita Algo Despus de Su Visita  Tambin puede enviarnos un mensaje a travs de Clinical cytogeneticist. Por lo general respondemos a los mensajes de MyChart en el transcurso de 1 a 2 das hbiles.  Para renovar recetas, por favor pida a su farmacia que se ponga en contacto con nuestra oficina. Randi lakes de fax es [address] [phone].  Si tiene un asunto urgente cuando la clnica est cerrada y que no puede esperar hasta el siguiente da hbil, puede llamar/localizar a su doctor(a) al nmero que aparece a continuacin.  Por favor, tenga en cuenta que aunque hacemos todo lo posible para estar disponibles para asuntos urgentes fuera del horario de [place], no estamos disponibles las 24 horas del da, los 7 [address]  [address] de la [place].   Si tiene un problema urgente y no puede comunicarse con nosotros, puede optar por buscar atencin mdica  en el consultorio de su doctor(a), en una clnica privada, en un centro de atencin urgente o en una sala de emergencias.  Si tiene Engineer, drilling, por favor llame inmediatamente al 911 o vaya a la sala de emergencias.  Nmeros de bper  - Dr. Hester: [phone]  - Dra. Jackquline: [phone]  - Dr. Claudene: [phone]  - Dra. Kitts: [phone]  En caso de inclemencias del [place], por favor llame a nuestra lnea principal al [phone] para una actualizacin sobre el estado de cualquier retraso o cierre.  Consejos para la medicacin en dermatologa: Por favor, guarde las  cajas en las que vienen los medicamentos de uso tpico para ayudarle a seguir las instrucciones sobre dnde y cmo usarlos. Las farmacias generalmente imprimen las instrucciones del medicamento slo en las cajas y no directamente en los tubos del [place].   Si su medicamento es muy caro, por favor, pngase en contacto con landry rieger llamando al [phone] y presione la opcin 4 o envenos un mensaje a travs de Clinical cytogeneticist.   No podemos decirle cul ser su copago por los medicamentos por adelantado ya que esto es diferente dependiendo de la cobertura de su seguro. Sin embargo, es posible que podamos encontrar un medicamento sustituto a Audiological scientist un formulario para que el seguro cubra el medicamento que se considera necesario.   Si se requiere una autorizacin previa para que su compaa de seguros malta su medicamento, por favor permtanos de 1 a 2 das hbiles para completar este proceso.  Los precios de los medicamentos varan con frecuencia dependiendo del Environmental consultant de dnde se surte la receta y alguna farmacias pueden ofrecer precios ms baratos.  El sitio web www.goodrx.com tiene cupones para medicamentos de Health and safety inspector. Los precios aqu no tienen en cuenta lo que podra costar con la ayuda del seguro (puede ser ms barato con su seguro), pero el sitio web puede darle el precio si no utiliz Tourist information centre manager.  - Puede imprimir el cupn correspondiente y llevarlo con su receta a la farmacia.  - Tambin puede pasar por nuestra oficina durante el horario de atencin regular y Education officer, museum una tarjeta de cupones de GoodRx.  - Si necesita que su receta se enve electrnicamente a una farmacia diferente, informe a nuestra oficina a travs de MyChart de [place] o por telfono llamando al [phone] y presione la opcin 4.

## 2024-07-17 NOTE — Progress Notes (Signed)
 Follow-Up Visit   Subjective  Charles Choi is a 73 y.o. male who presents for the following: Skin Cancer Screening and Full Body Skin Exam  The patient presents for Total-Body Skin Exam (TBSE) for skin cancer screening and mole check. The patient has spots, moles and lesions to be evaluated, some may be new or changing and the patient may have concern these could be cancer.  The following portions of the chart were reviewed this encounter and updated as appropriate: medications, allergies, medical history  Review of Systems:  No other skin or systemic complaints except as noted in HPI or Assessment and Plan.  Objective  Well appearing patient in no apparent distress; mood and affect are within normal limits.  A full examination was performed including scalp, head, eyes, ears, nose, lips, neck, chest, axillae, abdomen, back, buttocks, bilateral upper extremities, bilateral lower extremities, hands, feet, fingers, toes, fingernails, and toenails. All findings within normal limits unless otherwise noted below.   Relevant physical exam findings are noted in the Assessment and Plan.  mid to low back x 3 (3) Erythematous stuck-on, waxy papule or plaque face x 5 (5) Erythematous thin papules/macules with gritty scale.  L palm base of the index finger x 1 Verrucous papules -- Discussed viral etiology and contagion.   Assessment & Plan   SKIN CANCER SCREENING PERFORMED TODAY.  ACTINIC DAMAGE - Chronic condition, secondary to cumulative UV/sun exposure - diffuse scaly erythematous macules with underlying dyspigmentation - Recommend daily broad spectrum sunscreen SPF 30+ to sun-exposed areas, reapply every 2 hours as needed.  - Staying in the shade or wearing long sleeves, sun glasses (UVA+UVB protection) and wide brim hats (4-inch brim around the entire circumference of the hat) are also recommended for sun protection.  - Call for new or changing lesions.  LENTIGINES, SEBORRHEIC  KERATOSES, HEMANGIOMAS - Benign normal skin lesions - Benign-appearing - Call for any changes  MELANOCYTIC NEVI - Tan-brown and/or pink-flesh-colored symmetric macules and papules - Benign appearing on exam today - Observation - Call clinic for new or changing moles - Recommend daily use of broad spectrum spf 30+ sunscreen to sun-exposed areas.   SEBORRHEIC DERMATITIS Exam: erythema of the forehead and mid face with peeling. Chronic and persistent condition with duration or expected duration over one year. Condition is bothersome/symptomatic for patient. Currently flared. Seborrheic Dermatitis is a chronic persistent rash characterized by pinkness and scaling most commonly of the mid face but also can occur on the scalp (dandruff), ears; mid chest, mid back and groin.  It tends to be exacerbated by stress and cooler weather.  People who have neurologic disease may experience new onset or exacerbation of existing seborrheic dermatitis.  The condition is not curable but treatable and can be controlled. Treatment Plan: Re-start Ketoconazole  2% cream alternating with HC cream every other day.   ROSACEA Exam Mid face erythema with telangiectasias +/- scattered inflammatory papules Chronic and persistent condition with duration or expected duration over one year. Condition is bothersome/symptomatic for patient. Currently flared. Rosacea is a chronic progressive skin condition usually affecting the face of adults, causing redness and/or acne bumps. It is treatable but not curable. It sometimes affects the eyes (ocular rosacea) as well. It may respond to topical and/or systemic medication and can flare with stress, sun exposure, alcohol, exercise, topical steroids (including hydrocortisone /cortisone 10) and some foods.  Daily application of broad spectrum spf 30+ sunscreen to face is recommended to reduce flares. Treatment Plan Will tx seb derm.  And re-check rosacea once Seb Derm under better  control.  EPIDERMAL INCLUSION CYST Exam: Subcutaneous nodule at 1.5 cm R upper back paraspinal Benign-appearing. Exam most consistent with an epidermal inclusion cyst. Discussed that a cyst is a benign growth that can grow over time and sometimes get irritated or inflamed. Recommend observation if it is not bothersome. Discussed option of surgical excision to remove it if it is growing, symptomatic, or other changes noted. Please call for new or changing lesions so they can be evaluated.  HISTORY OF BASAL CELL CARCINOMA OF THE SKIN - No evidence of recurrence today - Recommend regular full body skin exams - Recommend daily broad spectrum sunscreen SPF 30+ to sun-exposed areas, reapply every 2 hours as needed.  - Call if any new or changing lesions are noted between office visits  HISTORY OF SQUAMOUS CELL CARCINOMA OF THE SKIN - No evidence of recurrence today - No lymphadenopathy - Recommend regular full body skin exams - Recommend daily broad spectrum sunscreen SPF 30+ to sun-exposed areas, reapply every 2 hours as needed.  - Call if any new or changing lesions are noted between office visits   INFLAMED SEBORRHEIC KERATOSIS (3) mid to low back x 3 (3) Symptomatic, irritating, patient would like treated.  Destruction of lesion - mid to low back x 3 (3) Complexity: simple   Destruction method: cryotherapy   Informed consent: discussed and consent obtained   Timeout:  patient name, date of birth, surgical site, and procedure verified Lesion destroyed using liquid nitrogen: Yes   Region frozen until ice ball extended beyond lesion: Yes   Outcome: patient tolerated procedure well with no complications   Post-procedure details: wound care instructions given    AK (ACTINIC KERATOSIS) (5) face x 5 (5) Actinic keratoses are precancerous spots that appear secondary to cumulative UV radiation exposure/sun exposure over time. They are chronic with expected duration over 1 year. A portion of  actinic keratoses will progress to squamous cell carcinoma of the skin. It is not possible to reliably predict which spots will progress to skin cancer and so treatment is recommended to prevent development of skin cancer.  Recommend daily broad spectrum sunscreen SPF 30+ to sun-exposed areas, reapply every 2 hours as needed.  Recommend staying in the shade or wearing long sleeves, sun glasses (UVA+UVB protection) and wide brim hats (4-inch brim around the entire circumference of the hat). Call for new or changing lesions.  Destruction of lesion - face x 5 (5) Complexity: simple   Destruction method: cryotherapy   Informed consent: discussed and consent obtained   Timeout:  patient name, date of birth, surgical site, and procedure verified Lesion destroyed using liquid nitrogen: Yes   Region frozen until ice ball extended beyond lesion: Yes   Outcome: patient tolerated procedure well with no complications   Post-procedure details: wound care instructions given    VIRAL WARTS, UNSPECIFIED TYPE L palm base of the index finger x 1 Viral Wart (HPV) Counseling  Discussed viral / HPV (Human Papilloma Virus) etiology and risk of spread /infectivity to other areas of body as well as to other people.  Multiple treatments and methods may be required to clear warts and it is possible treatment may not be successful.  Treatment risks include discoloration; scarring and there is still potential for wart recurrence. Destruction of lesion - L palm base of the index finger x 1 Complexity: simple   Destruction method: cryotherapy   Informed consent: discussed and consent obtained  Timeout:  patient name, date of birth, surgical site, and procedure verified Lesion destroyed using liquid nitrogen: Yes   Region frozen until ice ball extended beyond lesion: Yes   Outcome: patient tolerated procedure well with no complications   Post-procedure details: wound care instructions given     Return in about 1  year (around 07/17/2025) for TBSE - hx BCC, SCC, AK, ISK; schedule for cyst excision with Dr. Raymund.  LILLETTE Rosina Mayans, CMA, am acting as scribe for Alm Rhyme, MD .   Documentation: I have reviewed the above documentation for accuracy and completeness, and I agree with the above.  Alm Rhyme, MD

## 2024-08-20 ENCOUNTER — Encounter

## 2024-09-03 ENCOUNTER — Ambulatory Visit (INDEPENDENT_AMBULATORY_CARE_PROVIDER_SITE_OTHER)

## 2024-09-03 DIAGNOSIS — L720 Epidermal cyst: Secondary | ICD-10-CM | POA: Diagnosis not present

## 2024-09-03 NOTE — Progress Notes (Signed)
 Subjective   Charles Choi is a 73 y.o. male who presents for the following: cyst of the R upper back paraspinal. Patient is established patient   Today patient reports: Cyst of the R upper back  Pacemaker/Defibrillator: Denies  Allergies: Yes - promethazine, metoclopramide, phenergan Anticoagulants/Aspirin : Yes - aspirin  81 mg po QD Heart valves: Denies  Joint replacements:  Denies  Pt w/hx of deep brian simulator    Review of Systems:    No other skin or systemic complaints except as noted in HPI or Assessment and Plan.  The following portions of the chart were reviewed this encounter and updated as appropriate: medications, allergies, medical history  Relevant Medical History:  n/a   Objective  Well appearing patient in no apparent distress; mood and affect are within normal limits. Examination was performed of the: Focused Exam of: the back   Examination notable for: firm SQ nodule on the R upper back paraspinal Examination limited by: n/a   R upper back paraspinal 2.3 cm Firm SQ nodule.  Assessment & Plan   Follow up of cystic nodule on the back   Level of service outlined above   Procedures, orders, diagnosis for this visit:  EPIDERMOID CYST R upper back paraspinal Skin excision - R upper back paraspinal  Excision method:  elliptical Lesion length (cm):  2.3 Lesion width (cm):  2.3 Margin per side (cm):  0 Total excision diameter (cm):  2.3 Informed consent: discussed and consent obtained   Timeout: patient name, date of birth, surgical site, and procedure verified   Procedure prep:  Patient was prepped and draped in usual sterile fashion Prep type:  Chlorhexidine Anesthesia: the lesion was anesthetized in a standard fashion   Anesthetic:  1% lidocaine  w/ epinephrine  1-100,000 buffered w/ 8.4% NaHCO3 Instrument used: #15 blade   Hemostasis achieved with: suture and pressure   Outcome: patient tolerated procedure well with no complications     Skin repair - R upper back paraspinal Complexity:  Intermediate Final length (cm):  2 Informed consent: discussed and consent obtained   Timeout: patient name, date of birth, surgical site, and procedure verified   Procedure prep:  Patient was prepped and draped in usual sterile fashion Prep type:  Chlorhexidine Anesthesia: the lesion was anesthetized in a standard fashion   Anesthetic:  1% lidocaine  w/ epinephrine  1-100,000 buffered w/ 8.4% NaHCO3 Reason for type of repair: reduce tension to allow closure, reduce the risk of dehiscence, infection, and necrosis, reduce subcutaneous dead space and avoid a hematoma, allow closure of the large defect and preserve normal anatomy   Undermining: edges could be approximated without difficulty   Subcutaneous layers (deep stitches):  Suture size:  3-0 Suture type: PDS (polydioxanone)   Stitches:  Buried vertical mattress Fine/surface layer approximation (top stitches):  Suture size:  5-0 Suture type: Prolene (polypropylene)   Stitches comment:  Running locked Suture removal (days):  7 Hemostasis achieved with: suture and pressure Outcome: patient tolerated procedure well with no complications   Post-procedure details: sterile dressing applied and wound care instructions given   Dressing type: petrolatum, bandage and pressure dressing   Additional details:  Initial fascial plication suture placed to close dead space   Specimen 1 - Surgical pathology Differential Diagnosis: D48.5 r/o cyst Check Margins: No  Epidermoid cyst -     Skin excision -     Skin repair -     Surgical pathology; Standing   Return to clinic: Return for suture removal  in 7-10 days.  LILLETTE Rosina Mayans, CMA, am acting as scribe for Lauraine JAYSON Kanaris, MD .  Documentation: I have reviewed the above documentation for accuracy and completeness, and I agree with the above.  Lauraine JAYSON Kanaris, MD

## 2024-09-03 NOTE — Patient Instructions (Signed)

## 2024-09-05 LAB — SURGICAL PATHOLOGY

## 2024-09-09 ENCOUNTER — Ambulatory Visit: Payer: Self-pay

## 2024-09-09 NOTE — Progress Notes (Signed)
Patient informed and voiced good understanding.

## 2024-09-10 ENCOUNTER — Ambulatory Visit

## 2024-09-10 DIAGNOSIS — L720 Epidermal cyst: Secondary | ICD-10-CM

## 2024-09-10 DIAGNOSIS — Z4802 Encounter for removal of sutures: Secondary | ICD-10-CM

## 2024-09-10 NOTE — Progress Notes (Signed)
    Subjective   Charles Choi is a 73 y.o. male who presents for the following: suture removal. Denies any issues   The following portions of the chart were reviewed this encounter and updated as appropriate: medications, allergies, medical history    Objective  Well appearing patient in no apparent distress; mood and affect are within normal limits. Examination was performed of the: back Healing excision site     Assessment & Plan   S/p cyst excision, back  Patient presents for suture removal. The wound is well healed without signs of infection.  The sutures are removed. Wound care and activity instructions given. Return prn.  Procedures, orders, diagnosis for this visit:    There are no diagnoses linked to this encounter.  Return to clinic: No follow-ups on file.  Documentation: I have reviewed the above documentation for accuracy and completeness, and I agree with the above.  Lauraine JAYSON Kanaris, MD

## 2025-07-23 ENCOUNTER — Ambulatory Visit: Admitting: Dermatology
